# Patient Record
Sex: Male | Born: 1980 | Race: White | Hispanic: No | Marital: Single | Smoking: Never smoker
Health system: Southern US, Community
[De-identification: ages and names within clinical notes are randomized; demographics above are authoritative.]

## PROBLEM LIST (undated history)

## (undated) DIAGNOSIS — I8290 Acute embolism and thrombosis of unspecified vein: Secondary | ICD-10-CM

## (undated) DIAGNOSIS — M549 Dorsalgia, unspecified: Secondary | ICD-10-CM

## (undated) DIAGNOSIS — K8020 Calculus of gallbladder without cholecystitis without obstruction: Secondary | ICD-10-CM

## (undated) DIAGNOSIS — I82409 Acute embolism and thrombosis of unspecified deep veins of unspecified lower extremity: Secondary | ICD-10-CM

## (undated) DIAGNOSIS — R7303 Prediabetes: Secondary | ICD-10-CM

## (undated) DIAGNOSIS — E785 Hyperlipidemia, unspecified: Secondary | ICD-10-CM

## (undated) DIAGNOSIS — I2699 Other pulmonary embolism without acute cor pulmonale: Secondary | ICD-10-CM

## (undated) DIAGNOSIS — I2692 Saddle embolus of pulmonary artery without acute cor pulmonale: Secondary | ICD-10-CM

## (undated) HISTORY — DX: Dorsalgia, unspecified: M54.9

## (undated) HISTORY — DX: Hyperlipidemia, unspecified: E78.5

## (undated) HISTORY — DX: Prediabetes: R73.03

## (undated) HISTORY — PX: TONSILLECTOMY: SHX5217

---
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
## 2002-10-25 ENCOUNTER — Ambulatory Visit (HOSPITAL_COMMUNITY): Admission: RE | Admit: 2002-10-25 | Discharge: 2002-10-25 | Payer: Self-pay | Admitting: Oral Surgery

## 2007-10-05 ENCOUNTER — Emergency Department (HOSPITAL_COMMUNITY): Admission: EM | Admit: 2007-10-05 | Discharge: 2007-10-05 | Payer: Self-pay | Admitting: Emergency Medicine

## 2011-11-25 DIAGNOSIS — E785 Hyperlipidemia, unspecified: Secondary | ICD-10-CM | POA: Diagnosis not present

## 2011-11-25 DIAGNOSIS — R635 Abnormal weight gain: Secondary | ICD-10-CM | POA: Diagnosis not present

## 2011-11-25 DIAGNOSIS — R5381 Other malaise: Secondary | ICD-10-CM | POA: Diagnosis not present

## 2011-11-25 DIAGNOSIS — I10 Essential (primary) hypertension: Secondary | ICD-10-CM | POA: Diagnosis not present

## 2011-11-25 DIAGNOSIS — R109 Unspecified abdominal pain: Secondary | ICD-10-CM | POA: Diagnosis not present

## 2011-12-30 DIAGNOSIS — M549 Dorsalgia, unspecified: Secondary | ICD-10-CM | POA: Diagnosis not present

## 2011-12-30 DIAGNOSIS — R609 Edema, unspecified: Secondary | ICD-10-CM | POA: Diagnosis not present

## 2012-01-05 ENCOUNTER — Other Ambulatory Visit: Payer: Self-pay | Admitting: Family Medicine

## 2012-01-08 ENCOUNTER — Ambulatory Visit (HOSPITAL_COMMUNITY)
Admission: RE | Admit: 2012-01-08 | Discharge: 2012-01-08 | Disposition: A | Discharge status: Home / Self Care | Payer: Medicare Other | Source: Ambulatory Visit | Attending: Family Medicine | Admitting: Family Medicine

## 2012-01-08 DIAGNOSIS — K8020 Calculus of gallbladder without cholecystitis without obstruction: Secondary | ICD-10-CM | POA: Diagnosis not present

## 2012-01-08 DIAGNOSIS — R748 Abnormal levels of other serum enzymes: Secondary | ICD-10-CM | POA: Diagnosis not present

## 2012-01-13 ENCOUNTER — Other Ambulatory Visit: Payer: Self-pay | Admitting: Family Medicine

## 2012-01-13 DIAGNOSIS — K760 Fatty (change of) liver, not elsewhere classified: Secondary | ICD-10-CM

## 2012-01-14 ENCOUNTER — Ambulatory Visit (HOSPITAL_COMMUNITY)
Admission: RE | Admit: 2012-01-14 | Discharge: 2012-01-14 | Disposition: A | Discharge status: Home / Self Care | Payer: Medicaid Other | Source: Ambulatory Visit | Attending: Family Medicine | Admitting: Family Medicine

## 2012-01-14 DIAGNOSIS — K760 Fatty (change of) liver, not elsewhere classified: Secondary | ICD-10-CM

## 2012-01-21 ENCOUNTER — Encounter (HOSPITAL_COMMUNITY): Payer: Self-pay | Admitting: *Deleted

## 2012-01-21 ENCOUNTER — Inpatient Hospital Stay (HOSPITAL_COMMUNITY)
Admission: EM | Admit: 2012-01-21 | Discharge: 2012-01-25 | DRG: 418 | Disposition: A | Discharge status: Home / Self Care | Payer: Medicare Other | Attending: General Surgery | Admitting: General Surgery

## 2012-01-21 ENCOUNTER — Emergency Department (HOSPITAL_COMMUNITY): Payer: Medicare Other

## 2012-01-21 DIAGNOSIS — K8000 Calculus of gallbladder with acute cholecystitis without obstruction: Principal | ICD-10-CM | POA: Diagnosis present

## 2012-01-21 DIAGNOSIS — E669 Obesity, unspecified: Secondary | ICD-10-CM | POA: Diagnosis present

## 2012-01-21 DIAGNOSIS — R079 Chest pain, unspecified: Secondary | ICD-10-CM | POA: Diagnosis not present

## 2012-01-21 DIAGNOSIS — R51 Headache: Secondary | ICD-10-CM | POA: Diagnosis present

## 2012-01-21 DIAGNOSIS — K810 Acute cholecystitis: Secondary | ICD-10-CM | POA: Diagnosis not present

## 2012-01-21 DIAGNOSIS — R1011 Right upper quadrant pain: Secondary | ICD-10-CM | POA: Diagnosis not present

## 2012-01-21 DIAGNOSIS — Z6841 Body Mass Index (BMI) 40.0 and over, adult: Secondary | ICD-10-CM | POA: Diagnosis not present

## 2012-01-21 DIAGNOSIS — K8020 Calculus of gallbladder without cholecystitis without obstruction: Secondary | ICD-10-CM | POA: Diagnosis not present

## 2012-01-21 DIAGNOSIS — R109 Unspecified abdominal pain: Secondary | ICD-10-CM

## 2012-01-21 DIAGNOSIS — K819 Cholecystitis, unspecified: Secondary | ICD-10-CM | POA: Diagnosis not present

## 2012-01-21 HISTORY — DX: Calculus of gallbladder without cholecystitis without obstruction: K80.20

## 2012-01-21 LAB — COMPREHENSIVE METABOLIC PANEL
ALT: 62 U/L — ABNORMAL HIGH (ref 0–53)
AST: 29 U/L (ref 0–37)
Alkaline Phosphatase: 68 U/L (ref 39–117)
CO2: 28 mEq/L (ref 19–32)
GFR calc Af Amer: >90 mL/min (ref >90)
GFR calc non Af Amer: 82 mL/min — ABNORMAL LOW (ref >90)
Glucose, Bld: 108 mg/dL — ABNORMAL HIGH (ref 70–99)
Potassium: 4 mEq/L (ref 3.5–5.1)
Sodium: 137 mEq/L (ref 135–145)
Total Protein: 7.3 g/dL (ref 6.0–8.3)

## 2012-01-21 LAB — CBC WITH DIFFERENTIAL/PLATELET
Lymphocytes Relative: 22 % (ref 12–46)
Lymphs Abs: 1.3 10*3/uL (ref 0.7–4.0)
Neutro Abs: 3.9 10*3/uL (ref 1.7–7.7)
Neutrophils Relative %: 64 % (ref 43–77)
Platelets: 246 10*3/uL (ref 150–400)
RBC: 4.4 MIL/uL (ref 4.22–5.81)
WBC: 6 10*3/uL (ref 4.0–10.5)

## 2012-01-21 MED ORDER — ENOXAPARIN SODIUM 40 MG/0.4ML ~~LOC~~ SOLN
40.0000 mg | SUBCUTANEOUS | Status: DC
  Administered 2012-01-21 – 2012-01-24 (×4): 40 mg via SUBCUTANEOUS
  Filled 2012-01-21 (×4): qty 0.4

## 2012-01-21 MED ORDER — ONDANSETRON HCL 4 MG/2ML IJ SOLN
INTRAMUSCULAR | Status: AC
  Filled 2012-01-21: qty 2

## 2012-01-21 MED ORDER — ONDANSETRON HCL 4 MG/2ML IJ SOLN
4.0000 mg | Freq: Once | INTRAMUSCULAR | Status: AC
  Administered 2012-01-21: 4 mg via INTRAVENOUS
  Filled 2012-01-21: qty 2

## 2012-01-21 MED ORDER — ONDANSETRON HCL 4 MG/2ML IJ SOLN
4.0000 mg | Freq: Three times a day (TID) | INTRAMUSCULAR | Status: AC | PRN
  Administered 2012-01-21: 4 mg via INTRAVENOUS
  Filled 2012-01-21: qty 2

## 2012-01-21 MED ORDER — LACTATED RINGERS IV SOLN
INTRAVENOUS | Status: DC
  Administered 2012-01-21 – 2012-01-22 (×3): via INTRAVENOUS

## 2012-01-21 MED ORDER — SODIUM CHLORIDE 0.9 % IV SOLN: INTRAVENOUS | Status: AC

## 2012-01-21 MED ORDER — HYDROMORPHONE HCL PF 1 MG/ML IJ SOLN
INTRAMUSCULAR | Status: AC
  Filled 2012-01-21: qty 1

## 2012-01-21 MED ORDER — HYDROMORPHONE HCL PF 1 MG/ML IJ SOLN
1.0000 mg | INTRAMUSCULAR | Status: DC | PRN
  Administered 2012-01-21: 1 mg via INTRAVENOUS

## 2012-01-21 MED ORDER — PANTOPRAZOLE SODIUM 40 MG IV SOLR
40.0000 mg | Freq: Every day | INTRAVENOUS | Status: DC
  Administered 2012-01-21 – 2012-01-24 (×4): 40 mg via INTRAVENOUS
  Filled 2012-01-21 (×4): qty 40

## 2012-01-21 MED ORDER — ONDANSETRON HCL 4 MG/2ML IJ SOLN
4.0000 mg | Freq: Four times a day (QID) | INTRAMUSCULAR | Status: DC | PRN
  Administered 2012-01-21: 4 mg via INTRAVENOUS

## 2012-01-21 MED ORDER — HYDROMORPHONE HCL PF 1 MG/ML IJ SOLN
1.0000 mg | INTRAMUSCULAR | Status: AC | PRN
  Administered 2012-01-21 (×2): 1 mg via INTRAVENOUS
  Filled 2012-01-21 (×2): qty 1

## 2012-01-21 MED ORDER — SODIUM CHLORIDE 0.9 % IV SOLN
INTRAVENOUS | Status: DC
  Administered 2012-01-21: 11:00:00 via INTRAVENOUS

## 2012-01-21 MED ORDER — HYDROMORPHONE HCL PF 1 MG/ML IJ SOLN
1.0000 mg | Freq: Once | INTRAMUSCULAR | Status: AC
  Administered 2012-01-21: 1 mg via INTRAVENOUS
  Filled 2012-01-21: qty 1

## 2012-01-21 NOTE — ED Notes (Signed)
Family at bedside. Patient assisted to restroom back to bed tolerated well. Patient would like some pain medicine at this time. RN made aware.

## 2012-01-21 NOTE — ED Notes (Signed)
Hx of gallstones - report right sided abd pain with cp and upper back pain between shoulder blades since 0300.  Also c/o nausea and periods of sob.

## 2012-01-21 NOTE — ED Provider Notes (Cosign Needed Addendum)
History   This chart was scribed for Carleene Cooper III, MD by Gerlean Ren. This patient was seen in room APA12/[facility]12 and the patient's care was started at 9:55AM.   CSN: [id number]  Arrival date & time 01/21/12  0905   First MD Initiated Contact with Patient 01/21/12 [phone]      Chief Complaint  Patient presents with  . Chest Pain  . Abdominal Pain    (Consider location/radiation/quality/duration/timing/severity/associated sxs/prior treatment) The history is provided by the patient. No language interpreter was used.   Justin Schmidt is a 31 y.o. male with a h/o recently discovered cholelithiasis who presents to the Emergency Department complaining of sudden onset, gradually worsening, non-radiating right RUQ pain with associated nausea beginning 7 hours ago with no obvious cause of onset that is worsened by deep breathing and has thus caused mild SOB.  Pt further reports chest and upper back pain between shoulders.  Pt has taken ibuprofen with no improvement to pain.  Pt denies neck pain, fever, otalgia, sore throat, coughing, urinary symptoms, rash, syncope, and seizures.  Mother and grandfather h/o cholelithiasis.  Pt has had appendectomy.  Pt denies tobacco use and reports rare alcohol use.  Pt was told no surgical intervention for cholelithiasis would be necessary upon diagnosis.  Past Medical History  Diagnosis Date  . Gallstones     Past Surgical History  Procedure Date  . Appendectomy     No family history on file.  History  Substance Use Topics  . Smoking status: Never Smoker   . Smokeless tobacco: Not on file  . Alcohol Use: No     occasional      Review of Systems  All other systems reviewed and are negative.    Allergies  Review of patient's allergies indicates no known allergies.  Home Medications  No current outpatient prescriptions on file.  BP 136/72  Pulse 74  Temp 98.4 F (36.9 C) (Oral)  Resp 20  Ht 5\' 9"  (1.753 m)  Wt 317 lb (143.79 kg)  BMI  46.81 kg/m2  SpO2 99%  Physical Exam  Nursing note and vitals reviewed. Constitutional: He is oriented to person, place, and time. He appears well-developed.       Pt is morbidly obese.  HENT:  Head: Normocephalic and atraumatic.  Eyes: Conjunctivae normal and EOM are normal. Pupils are equal, round, and reactive to light. No scleral icterus.  Neck: Normal range of motion. Neck supple. No tracheal deviation present.  Cardiovascular: Normal rate, regular rhythm and normal heart sounds.  Exam reveals no gallop and no friction rub.   No murmur heard. Pulmonary/Chest: Effort normal and breath sounds normal. He has no wheezes. He has no rales. He exhibits no tenderness.  Abdominal: Soft. Bowel sounds are normal. He exhibits no distension and no mass. There is tenderness. There is no rebound.       RUQ tenderness.  No rigidity.    Musculoskeletal: Normal range of motion. He exhibits no tenderness.       2+ edema in bilateral lower legs.  Lymphadenopathy:    He has no cervical adenopathy.  Neurological: He is alert and oriented to person, place, and time. Coordination normal.  Skin: Skin is warm. No rash noted. No erythema.       Not jaundiced.    Psychiatric: He has a normal mood and affect. His behavior is normal.    ED Course  Procedures (including critical care time) DIAGNOSTIC STUDIES: Oxygen Saturation is 99% on room  air, normal by my interpretation.    COORDINATION OF CARE: 10:02AM- Ordered Iv fluids, zofran, dilaudid, CBC, urinalysis, c-met, lipase, and chest XR. 12:57PM- Re-check, pt's pain has somewhat reduced.  Informed pt of normal tests, offered decision to be admitted to pt. 1:24PM- Pt has chosen to be admitted.      Date: 01/21/2012  Rate:63  Rhythm: normal sinus rhythm  QRS Axis: normal  Intervals: normal  ST/T Wave abnormalities: normal  Conduction Disutrbances:none  Narrative Interpretation: Normal EKG  Old EKG Reviewed: none available Results for orders  placed during the hospital encounter of 01/21/12  CBC WITH DIFFERENTIAL      Component Value Range   WBC 6.0  4.0 - 10.5 K/uL   RBC 4.40  4.22 - 5.81 MIL/uL   Hemoglobin 14.2  13.0 - 17.0 g/dL   HCT 16.1  09.6 - 04.5 %   MCV 91.4  78.0 - 100.0 fL   MCH 32.3  26.0 - 34.0 pg   MCHC 35.3  30.0 - 36.0 g/dL   RDW 40.9  81.1 - 91.4 %   Platelets 246  150 - 400 K/uL   Neutrophils Relative 64  43 - 77 %   Neutro Abs 3.9  1.7 - 7.7 K/uL   Lymphocytes Relative 22  12 - 46 %   Lymphs Abs 1.3  0.7 - 4.0 K/uL   Monocytes Relative 10  3 - 12 %   Monocytes Absolute 0.6  0.1 - 1.0 K/uL   Eosinophils Relative 3  0 - 5 %   Eosinophils Absolute 0.2  0.0 - 0.7 K/uL   Basophils Relative 1  0 - 1 %   Basophils Absolute 0.0  0.0 - 0.1 K/uL  COMPREHENSIVE METABOLIC PANEL      Component Value Range   Sodium 137  135 - 145 mEq/L   Potassium 4.0  3.5 - 5.1 mEq/L   Chloride 101  96 - 112 mEq/L   CO2 28  19 - 32 mEq/L   Glucose, Bld 108 (*) 70 - 99 mg/dL   BUN 16  6 - 23 mg/dL   Creatinine, Ser 7.82  0.50 - 1.35 mg/dL   Calcium 9.7  8.4 - 95.6 mg/dL   Total Protein 7.3  6.0 - 8.3 g/dL   Albumin 4.0  3.5 - 5.2 g/dL   AST 29  0 - 37 U/L   ALT 62 (*) 0 - 53 U/L   Alkaline Phosphatase 68  39 - 117 U/L   Total Bilirubin 0.2 (*) 0.3 - 1.2 mg/dL   GFR calc non Af Amer 82 (*) >90 mL/min   GFR calc Af Amer >90  >90 mL/min  LIPASE, BLOOD      Component Value Range   Lipase 21  11 - 59 U/L    .   Dg Chest 2 View  01/21/2012  *RADIOLOGY REPORT*  Clinical Data: Chest pain  CHEST - 2 VIEW  Comparison: None.  Findings: Cardiomediastinal silhouette is unremarkable.  No acute infiltrate or pleural effusion.  No pulmonary edema.  Bony thorax is unremarkable.  IMPRESSION: No active disease.   Original Report Authenticated By: Natasha Mead, M.D.    1:57 PM Pt has persisting pain despite IV medication for pain and nausea.  Case discussed with Dr. Leticia Penna, surgeon on call, who will see and admit pt.   1. Abdominal  pain   2. Cholelithiasis     I personally performed the services described in this documentation, which was scribed  in my presence. The recorded information has been reviewed and considered.  Osvaldo Human, MD      Carleene Cooper III, MD 01/21/12 1400    Carleene Cooper III, MD 01/21/12 [phone]

## 2012-01-21 NOTE — Plan of Care (Signed)
Problem: Consults Goal: General Medical Patient Education See Patient Education Module for specific education. Outcome: Progressing abd pain  Problem: Phase I Progression Outcomes Goal: Initial discharge plan identified Outcome: Completed/Met Date Met:  01/21/12 Plan to d/c back home with family

## 2012-01-21 NOTE — ED Notes (Signed)
Pt reports having an MRI scheduled for tmrw.  Pt reports having an ultrasound performed here a few weeks ago and some spots on his liver were found.

## 2012-01-21 NOTE — ED Notes (Signed)
Called to give reports.  Nurse to call me back

## 2012-01-22 LAB — CBC
HCT: 40.3 % (ref 39.0–52.0)
MCV: 92 fL (ref 78.0–100.0)
RBC: 4.38 MIL/uL (ref 4.22–5.81)
WBC: 8.2 10*3/uL (ref 4.0–10.5)

## 2012-01-22 LAB — URINALYSIS, ROUTINE W REFLEX MICROSCOPIC
Bilirubin Urine: NEGATIVE
Glucose, UA: NEGATIVE mg/dL
Hgb urine dipstick: NEGATIVE
Specific Gravity, Urine: 1.025 (ref 1.005–1.030)
Urobilinogen, UA: 0.2 mg/dL (ref 0.0–1.0)

## 2012-01-22 LAB — COMPREHENSIVE METABOLIC PANEL
Albumin: 3.8 g/dL (ref 3.5–5.2)
Alkaline Phosphatase: 56 U/L (ref 39–117)
BUN: 11 mg/dL (ref 6–23)
Creatinine, Ser: 1.13 mg/dL (ref 0.50–1.35)
Potassium: 3.7 mEq/L (ref 3.5–5.1)
Total Protein: 7 g/dL (ref 6.0–8.3)

## 2012-01-22 MED ORDER — ACETAMINOPHEN 325 MG PO TABS
650.0000 mg | ORAL_TABLET | Freq: Once | ORAL | Status: AC
  Administered 2012-01-22: 650 mg via ORAL
  Filled 2012-01-22: qty 2

## 2012-01-22 NOTE — Plan of Care (Signed)
Problem: Phase III Progression Outcomes Goal: Activity at appropriate level-compared to baseline (UP IN CHAIR FOR HEMODIALYSIS)  Outcome: Completed/Met Date Met:  01/22/12 Pt up to chair.  Ambulating independently to bathroom.

## 2012-01-22 NOTE — Progress Notes (Signed)
Pt c/o headache.  Pt has no PRN orders for pain.  Dr. Suzette Battiest paged and returned page.  Received telephone order for Tylenol 650 mg 1 po for one dose.  Dr. Suzette Battiest stated that he would be up to see the patient shortly.  Orders followed.

## 2012-01-22 NOTE — H&P (Signed)
Justin Schmidt is an 31 y.o. male.   Chief Complaint: RUQ pain  HPI: Patient has had several months of episodes of RUQ pain.  No fever or chills.  No history of jaundice.  No change with BM.  No melena.  No hematochezia.  Some nausea.  No emesis.  Known gallstones.    Past Medical History  Diagnosis Date  . Gallstones     Past Surgical History  Procedure Date  . Appendectomy     No family history on file. Social History:  reports that he has never smoked. He does not have any smokeless tobacco history on file. He reports that he does not drink alcohol or use illicit drugs.  Allergies: No Known Allergies  Medications Prior to Admission  Medication Sig Dispense Refill  . ibuprofen (ADVIL,MOTRIN) 200 MG tablet Take 200 mg by mouth every 6 (six) hours as needed. Pain      . Multiple Vitamin (MULTIVITAMIN WITH MINERALS) TABS Take 1 tablet by mouth daily.        Results for orders placed during the hospital encounter of 01/21/12 (from the past 48 hour(s))  CBC WITH DIFFERENTIAL     Status: Normal   Collection Time   01/21/12 10:08 AM      Component Value Range Comment   WBC 6.0  4.0 - 10.5 K/uL    RBC 4.40  4.22 - 5.81 MIL/uL    Hemoglobin 14.2  13.0 - 17.0 g/dL    HCT 19.1  47.8 - 29.5 %    MCV 91.4  78.0 - 100.0 fL    MCH 32.3  26.0 - 34.0 pg    MCHC 35.3  30.0 - 36.0 g/dL    RDW 62.1  30.8 - 65.7 %    Platelets 246  150 - 400 K/uL    Neutrophils Relative 64  43 - 77 %    Neutro Abs 3.9  1.7 - 7.7 K/uL    Lymphocytes Relative 22  12 - 46 %    Lymphs Abs 1.3  0.7 - 4.0 K/uL    Monocytes Relative 10  3 - 12 %    Monocytes Absolute 0.6  0.1 - 1.0 K/uL    Eosinophils Relative 3  0 - 5 %    Eosinophils Absolute 0.2  0.0 - 0.7 K/uL    Basophils Relative 1  0 - 1 %    Basophils Absolute 0.0  0.0 - 0.1 K/uL   COMPREHENSIVE METABOLIC PANEL     Status: Abnormal   Collection Time   01/21/12 10:08 AM      Component Value Range Comment   Sodium 137  135 - 145 mEq/L    Potassium 4.0   3.5 - 5.1 mEq/L    Chloride 101  96 - 112 mEq/L    CO2 28  19 - 32 mEq/L    Glucose, Bld 108 (*) 70 - 99 mg/dL    BUN 16  6 - 23 mg/dL    Creatinine, Ser 8.46  0.50 - 1.35 mg/dL    Calcium 9.7  8.4 - 96.2 mg/dL    Total Protein 7.3  6.0 - 8.3 g/dL    Albumin 4.0  3.5 - 5.2 g/dL    AST 29  0 - 37 U/L    ALT 62 (*) 0 - 53 U/L    Alkaline Phosphatase 68  39 - 117 U/L    Total Bilirubin 0.2 (*) 0.3 - 1.2 mg/dL    GFR  calc non Af Amer 82 (*) >90 mL/min    GFR calc Af Amer >90  >90 mL/min   LIPASE, BLOOD     Status: Normal   Collection Time   01/21/12 10:08 AM      Component Value Range Comment   Lipase 21  11 - 59 U/L   URINALYSIS, ROUTINE W REFLEX MICROSCOPIC     Status: Normal   Collection Time   01/21/12 11:42 PM      Component Value Range Comment   Color, Urine YELLOW  YELLOW    APPearance CLEAR  CLEAR    Specific Gravity, Urine 1.025  1.005 - 1.030    pH 5.5  5.0 - 8.0    Glucose, UA NEGATIVE  NEGATIVE mg/dL    Hgb urine dipstick NEGATIVE  NEGATIVE    Bilirubin Urine NEGATIVE  NEGATIVE    Ketones, ur NEGATIVE  NEGATIVE mg/dL    Protein, ur NEGATIVE  NEGATIVE mg/dL    Urobilinogen, UA 0.2  0.0 - 1.0 mg/dL    Nitrite NEGATIVE  NEGATIVE    Leukocytes, UA NEGATIVE  NEGATIVE MICROSCOPIC NOT DONE ON URINES WITH NEGATIVE PROTEIN, BLOOD, LEUKOCYTES, NITRITE, OR GLUCOSE <1000 mg/dL.  COMPREHENSIVE METABOLIC PANEL     Status: Abnormal   Collection Time   01/22/12  5:56 AM      Component Value Range Comment   Sodium 137  135 - 145 mEq/L    Potassium 3.7  3.5 - 5.1 mEq/L    Chloride 101  96 - 112 mEq/L    CO2 28  19 - 32 mEq/L    Glucose, Bld 117 (*) 70 - 99 mg/dL    BUN 11  6 - 23 mg/dL    Creatinine, Ser 1.61  0.50 - 1.35 mg/dL    Calcium 9.3  8.4 - 09.6 mg/dL    Total Protein 7.0  6.0 - 8.3 g/dL    Albumin 3.8  3.5 - 5.2 g/dL    AST 25  0 - 37 U/L    ALT 57 (*) 0 - 53 U/L    Alkaline Phosphatase 56  39 - 117 U/L    Total Bilirubin 0.5  0.3 - 1.2 mg/dL    GFR calc non Af Amer  85 (*) >90 mL/min    GFR calc Af Amer >90  >90 mL/min   CBC     Status: Normal   Collection Time   01/22/12  5:56 AM      Component Value Range Comment   WBC 8.2  4.0 - 10.5 K/uL    RBC 4.38  4.22 - 5.81 MIL/uL    Hemoglobin 14.0  13.0 - 17.0 g/dL    HCT 04.5  40.9 - 81.1 %    MCV 92.0  78.0 - 100.0 fL    MCH 32.0  26.0 - 34.0 pg    MCHC 34.7  30.0 - 36.0 g/dL    RDW 91.4  78.2 - 95.6 %    Platelets 242  150 - 400 K/uL    Dg Chest 2 View  01/21/2012  *RADIOLOGY REPORT*  Clinical Data: Chest pain  CHEST - 2 VIEW  Comparison: None.  Findings: Cardiomediastinal silhouette is unremarkable.  No acute infiltrate or pleural effusion.  No pulmonary edema.  Bony thorax is unremarkable.  IMPRESSION: No active disease.   Original Report Authenticated By: Natasha Mead, M.D.     Review of Systems  Constitutional: Positive for fever and chills. Negative for weight loss, malaise/fatigue  and diaphoresis.  Eyes: Negative.   Respiratory: Negative.   Cardiovascular: Negative.   Gastrointestinal: Positive for heartburn, nausea, abdominal pain (RUQ, epigastric with radiation to back.) and diarrhea. Negative for vomiting, constipation, blood in stool and melena.  Genitourinary: Negative.   Musculoskeletal: Negative.   Skin: Negative.   Neurological: Positive for headaches. Negative for weakness.  Endo/Heme/Allergies: Negative.   Psychiatric/Behavioral: Negative.     Blood pressure 132/79, pulse 81, temperature 98 F (36.7 C), temperature source Oral, resp. rate 18, height 5\' 9"  (1.753 m), weight 146.693 kg (323 lb 6.4 oz), SpO2 100.00%. Physical Exam  Constitutional: He is oriented to person, place, and time. He appears well-developed and well-nourished. No distress.       obese  HENT:  Head: Normocephalic and atraumatic.  Eyes: EOM are normal. Pupils are equal, round, and reactive to light. No scleral icterus.  Neck: Normal range of motion. Neck supple. No tracheal deviation present. No thyromegaly  present.  Cardiovascular: Normal rate, regular rhythm and normal heart sounds.   Respiratory: Effort normal and breath sounds normal.  GI: Soft. He exhibits no distension and no mass. There is tenderness (mild to moderate RUQ pain.  + murphy's). There is no rebound and no guarding.       obese  Lymphadenopathy:    He has no cervical adenopathy.  Neurological: He is alert and oriented to person, place, and time.  Skin: Skin is warm and dry.     Assessment/Plan Cholelithiasis, acute cholecystitis.  Continue slow advancement of diet.  NPO after Midnight.  IV fluid.  Risks benefits alternatives of surgery discussed with the patient.  Will plan to proceed with OR tomorrow.    Aniruddh Ciavarella C 01/22/2012, 10:14 PM

## 2012-01-23 ENCOUNTER — Encounter (HOSPITAL_COMMUNITY)
Admission: EM | Disposition: A | Discharge status: Home / Self Care | Payer: Self-pay | Source: Home / Self Care | Attending: General Surgery

## 2012-01-23 ENCOUNTER — Inpatient Hospital Stay (HOSPITAL_COMMUNITY): Payer: Medicare Other | Admitting: Anesthesiology

## 2012-01-23 ENCOUNTER — Encounter (HOSPITAL_COMMUNITY): Payer: Self-pay | Admitting: Anesthesiology

## 2012-01-23 ENCOUNTER — Encounter (HOSPITAL_COMMUNITY): Payer: Self-pay

## 2012-01-23 HISTORY — PX: CHOLECYSTECTOMY: SHX55

## 2012-01-23 SURGERY — LAPAROSCOPIC CHOLECYSTECTOMY
Anesthesia: General | Wound class: Clean Contaminated

## 2012-01-23 MED ORDER — BUPIVACAINE HCL (PF) 0.5 % IJ SOLN
INTRAMUSCULAR | Status: AC
  Filled 2012-01-23: qty 30

## 2012-01-23 MED ORDER — ROCURONIUM BROMIDE 100 MG/10ML IV SOLN
INTRAVENOUS | Status: DC | PRN
  Administered 2012-01-23: 40 mg via INTRAVENOUS

## 2012-01-23 MED ORDER — DEXTROSE 5 % IV SOLN: 3.0000 g | INTRAVENOUS | Status: DC

## 2012-01-23 MED ORDER — ROCURONIUM BROMIDE 50 MG/5ML IV SOLN
INTRAVENOUS | Status: AC
  Filled 2012-01-23: qty 1

## 2012-01-23 MED ORDER — MIDAZOLAM HCL 2 MG/2ML IJ SOLN
INTRAMUSCULAR | Status: AC
  Filled 2012-01-23: qty 2

## 2012-01-23 MED ORDER — MUPIROCIN 2 % EX OINT
TOPICAL_OINTMENT | Freq: Two times a day (BID) | CUTANEOUS | Status: DC
Start: 2012-01-23 — End: 2012-01-25
  Administered 2012-01-23 – 2012-01-24 (×2): via NASAL
  Filled 2012-01-23: qty 22

## 2012-01-23 MED ORDER — GLYCOPYRROLATE 0.2 MG/ML IJ SOLN
0.2000 mg | Freq: Once | INTRAMUSCULAR | Status: AC
  Administered 2012-01-23: 0.2 mg via INTRAVENOUS

## 2012-01-23 MED ORDER — LIDOCAINE HCL 1 % IJ SOLN
INTRAMUSCULAR | Status: DC | PRN
  Administered 2012-01-23: 50 mg via INTRADERMAL

## 2012-01-23 MED ORDER — LACTATED RINGERS IV SOLN
INTRAVENOUS | Status: DC
  Administered 2012-01-23: 09:00:00 via INTRAVENOUS

## 2012-01-23 MED ORDER — CEFAZOLIN SODIUM 1-5 GM-% IV SOLN
1.0000 g | INTRAVENOUS | Status: AC
  Administered 2012-01-23: 1 g via INTRAVENOUS

## 2012-01-23 MED ORDER — SUCCINYLCHOLINE CHLORIDE 20 MG/ML IJ SOLN
INTRAMUSCULAR | Status: AC
  Filled 2012-01-23: qty 1

## 2012-01-23 MED ORDER — DEXAMETHASONE SODIUM PHOSPHATE 4 MG/ML IJ SOLN
4.0000 mg | Freq: Once | INTRAMUSCULAR | Status: AC
  Administered 2012-01-23: 4 mg via INTRAVENOUS

## 2012-01-23 MED ORDER — FENTANYL CITRATE 0.05 MG/ML IJ SOLN
INTRAMUSCULAR | Status: AC
  Filled 2012-01-23: qty 2

## 2012-01-23 MED ORDER — FENTANYL CITRATE 0.05 MG/ML IJ SOLN
25.0000 ug | INTRAMUSCULAR | Status: DC | PRN
  Administered 2012-01-23 (×2): 25 ug via INTRAVENOUS
  Administered 2012-01-23: 50 ug via INTRAVENOUS

## 2012-01-23 MED ORDER — DEXAMETHASONE SODIUM PHOSPHATE 4 MG/ML IJ SOLN
INTRAMUSCULAR | Status: AC
  Filled 2012-01-23: qty 1

## 2012-01-23 MED ORDER — CEFAZOLIN SODIUM-DEXTROSE 2-3 GM-% IV SOLR
INTRAVENOUS | Status: AC
  Filled 2012-01-23: qty 50

## 2012-01-23 MED ORDER — PROPOFOL 10 MG/ML IV BOLUS
INTRAVENOUS | Status: DC | PRN
  Administered 2012-01-23: 200 mg via INTRAVENOUS

## 2012-01-23 MED ORDER — FENTANYL CITRATE 0.05 MG/ML IJ SOLN
INTRAMUSCULAR | Status: AC
  Filled 2012-01-23: qty 10

## 2012-01-23 MED ORDER — BUPIVACAINE HCL (PF) 0.5 % IJ SOLN
INTRAMUSCULAR | Status: DC | PRN
  Administered 2012-01-23: 10 mL

## 2012-01-23 MED ORDER — PROPOFOL 10 MG/ML IV EMUL
INTRAVENOUS | Status: AC
  Filled 2012-01-23: qty 20

## 2012-01-23 MED ORDER — CELECOXIB 100 MG PO CAPS
200.0000 mg | ORAL_CAPSULE | Freq: Two times a day (BID) | ORAL | Status: DC
  Administered 2012-01-23 – 2012-01-25 (×5): 200 mg via ORAL
  Filled 2012-01-23 (×5): qty 2

## 2012-01-23 MED ORDER — GLYCOPYRROLATE 0.2 MG/ML IJ SOLN
INTRAMUSCULAR | Status: AC
  Filled 2012-01-23: qty 1

## 2012-01-23 MED ORDER — ONDANSETRON HCL 4 MG/2ML IJ SOLN
INTRAMUSCULAR | Status: AC
  Filled 2012-01-23: qty 2

## 2012-01-23 MED ORDER — ONDANSETRON HCL 4 MG/2ML IJ SOLN: 4.0000 mg | Freq: Once | INTRAMUSCULAR | Status: DC | PRN

## 2012-01-23 MED ORDER — MIDAZOLAM HCL 5 MG/5ML IJ SOLN
INTRAMUSCULAR | Status: DC | PRN
  Administered 2012-01-23: 2 mg via INTRAVENOUS

## 2012-01-23 MED ORDER — HEMOSTATIC AGENTS (NO CHARGE) OPTIME
TOPICAL | Status: DC | PRN
  Administered 2012-01-23: 1 via TOPICAL

## 2012-01-23 MED ORDER — MUPIROCIN 2 % EX OINT
TOPICAL_OINTMENT | CUTANEOUS | Status: AC
  Filled 2012-01-23: qty 22

## 2012-01-23 MED ORDER — LIDOCAINE HCL (PF) 1 % IJ SOLN
INTRAMUSCULAR | Status: AC
  Filled 2012-01-23: qty 5

## 2012-01-23 MED ORDER — CEFAZOLIN SODIUM-DEXTROSE 2-3 GM-% IV SOLR
INTRAVENOUS | Status: DC | PRN
  Administered 2012-01-23: 1 g via INTRAVENOUS
  Administered 2012-01-23: 2 g via INTRAVENOUS

## 2012-01-23 MED ORDER — CEFAZOLIN SODIUM-DEXTROSE 2-3 GM-% IV SOLR
2.0000 g | INTRAVENOUS | Status: AC
  Administered 2012-01-23: 2 g via INTRAVENOUS

## 2012-01-23 MED ORDER — FENTANYL CITRATE 0.05 MG/ML IJ SOLN
INTRAMUSCULAR | Status: DC | PRN
  Administered 2012-01-23 (×3): 100 ug via INTRAVENOUS
  Administered 2012-01-23 (×3): 50 ug via INTRAVENOUS

## 2012-01-23 MED ORDER — MIDAZOLAM HCL 2 MG/2ML IJ SOLN
1.0000 mg | INTRAMUSCULAR | Status: DC | PRN
  Administered 2012-01-23: 2 mg via INTRAVENOUS

## 2012-01-23 MED ORDER — LABETALOL HCL 5 MG/ML IV SOLN
INTRAVENOUS | Status: AC
  Filled 2012-01-23: qty 4

## 2012-01-23 MED ORDER — SUCCINYLCHOLINE CHLORIDE 20 MG/ML IJ SOLN
INTRAMUSCULAR | Status: DC | PRN
  Administered 2012-01-23: 180 mg via INTRAVENOUS

## 2012-01-23 MED ORDER — SODIUM CHLORIDE 0.9 % IR SOLN
Status: DC | PRN
  Administered 2012-01-23: 1000 mL

## 2012-01-23 MED ORDER — HYDROCODONE-ACETAMINOPHEN 5-325 MG PO TABS
1.0000 | ORAL_TABLET | ORAL | Status: DC | PRN
  Administered 2012-01-23 – 2012-01-25 (×4): 2 via ORAL
  Filled 2012-01-23 (×4): qty 2

## 2012-01-23 MED ORDER — MUPIROCIN 2 % EX OINT
TOPICAL_OINTMENT | Freq: Two times a day (BID) | CUTANEOUS | Status: DC
  Administered 2012-01-23: 1 via NASAL

## 2012-01-23 MED ORDER — ONDANSETRON HCL 4 MG/2ML IJ SOLN
4.0000 mg | Freq: Once | INTRAMUSCULAR | Status: AC
  Administered 2012-01-23: 4 mg via INTRAVENOUS

## 2012-01-23 SURGICAL SUPPLY — 38 items
APPLIER CLIP UNV 5X34 EPIX (ENDOMECHANICALS) ×2 IMPLANT
BAG HAMPER (MISCELLANEOUS) ×2 IMPLANT
BENZOIN TINCTURE PRP APPL 2/3 (GAUZE/BANDAGES/DRESSINGS) ×2 IMPLANT
CLOTH BEACON ORANGE TIMEOUT ST (SAFETY) ×2 IMPLANT
COVER LIGHT HANDLE STERIS (MISCELLANEOUS) ×4 IMPLANT
DECANTER SPIKE VIAL GLASS SM (MISCELLANEOUS) ×2 IMPLANT
DEVICE TROCAR PUNCTURE CLOSURE (ENDOMECHANICALS) ×2 IMPLANT
DURAPREP 26ML APPLICATOR (WOUND CARE) ×2 IMPLANT
ELECT REM PT RETURN 9FT ADLT (ELECTROSURGICAL) ×2
ELECTRODE REM PT RTRN 9FT ADLT (ELECTROSURGICAL) ×1 IMPLANT
FILTER SMOKE EVAC LAPAROSHD (FILTER) ×2 IMPLANT
FORMALIN 10 PREFIL 120ML (MISCELLANEOUS) ×2 IMPLANT
GLOVE BIOGEL PI IND STRL 7.0 (GLOVE) ×1 IMPLANT
GLOVE BIOGEL PI IND STRL 7.5 (GLOVE) ×2 IMPLANT
GLOVE BIOGEL PI INDICATOR 7.0 (GLOVE) ×1
GLOVE BIOGEL PI INDICATOR 7.5 (GLOVE) ×2
GLOVE ECLIPSE 6.5 STRL STRAW (GLOVE) ×2 IMPLANT
GLOVE ECLIPSE 7.0 STRL STRAW (GLOVE) ×6 IMPLANT
GOWN STRL REIN XL XLG (GOWN DISPOSABLE) ×6 IMPLANT
HEMOSTAT SNOW SURGICEL 2X4 (HEMOSTASIS) ×2 IMPLANT
INST SET LAPROSCOPIC AP (KITS) ×2 IMPLANT
IV NS IRRIG 3000ML ARTHROMATIC (IV SOLUTION) ×2 IMPLANT
KIT ROOM TURNOVER APOR (KITS) ×2 IMPLANT
MANIFOLD NEPTUNE II (INSTRUMENTS) ×2 IMPLANT
NEEDLE INSUFFLATION 14GA 120MM (NEEDLE) ×2 IMPLANT
PACK LAP CHOLE LZT030E (CUSTOM PROCEDURE TRAY) ×2 IMPLANT
PAD ARMBOARD 7.5X6 YLW CONV (MISCELLANEOUS) ×2 IMPLANT
POUCH SPECIMEN RETRIEVAL 10MM (ENDOMECHANICALS) ×2 IMPLANT
SET BASIN LINEN APH (SET/KITS/TRAYS/PACK) ×2 IMPLANT
SET TUBE IRRIG SUCTION NO TIP (IRRIGATION / IRRIGATOR) ×2 IMPLANT
SLEEVE Z-THREAD 5X100MM (TROCAR) ×4 IMPLANT
STRIP CLOSURE SKIN 1/2X4 (GAUZE/BANDAGES/DRESSINGS) ×2 IMPLANT
SUT MNCRL AB 4-0 PS2 18 (SUTURE) ×4 IMPLANT
SUT VIC AB 2-0 CT2 27 (SUTURE) ×4 IMPLANT
SYR 30ML LL (SYRINGE) ×2 IMPLANT
TROCAR Z-THRD FIOS HNDL 11X100 (TROCAR) ×2 IMPLANT
TROCAR Z-THREAD FIOS 5X100MM (TROCAR) ×2 IMPLANT
WARMER LAPAROSCOPE (MISCELLANEOUS) ×2 IMPLANT

## 2012-01-23 NOTE — Plan of Care (Signed)
Problem: Phase I Progression Outcomes Goal: OOB as tolerated unless otherwise ordered Outcome: Progressing Pt ambulated approximately 100 feet in hallway.  Pt tolerated well.

## 2012-01-23 NOTE — Plan of Care (Signed)
Problem: Phase I Progression Outcomes Goal: Incision/dressings dry and intact Outcome: Completed/Met Date Met:  01/23/12 Steristrips clean, dry and intact to port sites x4

## 2012-01-23 NOTE — Progress Notes (Signed)
Ur chart review completed.  

## 2012-01-23 NOTE — Op Note (Signed)
Patient:  Justin Schmidt  DOB:  1980-12-25  MRN:  [id number]   Preop Diagnosis:  Acute cholecystitis  Postop Diagnosis:  The same  Procedure:  Laparoscopic cholecystectomy  Surgeon:  Dr. Tilford Pillar  Anes:  General endotracheal, 0.5% Sensorcaine plain for local  Indications:  Patient is a 31 year old male presented to [facility] the right upper quadrant abdominal pain. Workup and evaluation was consistent for acute cholecystitis. Risks benefits alternatives of a laparoscopic possible open cholecystectomy were discussed at length the patient including but not limited to risk of bleeding, infection, bile leak, small bowel injury, common bile duct injury, intraoperative cardiac and pulmonary events. Patient's questions and concerns are addressed the patient was consented for the planned procedure.  Procedure note:  Patient is taken to the operator is placed in a supine position on the OR table time the general anesthetic is a Optician, dispensing. Once patient was asleep he was endotracheally intubated by the nurse anesthetist. At this point his abdomen is prepped with DuraPrep solution and draped in standard fashion. A stab incision was created subumbilically with 11 blade scalpel with additional dissection carried out using a Coker clamp. This is utilized grasp the anterior normal Meschke and lift his anteriorly. A Veress needle is inserted saline drop test is utilized confirm intraperitoneal placement the pneumoperitoneum was initiated. Once sufficient pneumoperitoneum was obtained an 11 mm insert over a laparoscope allowing visualization the trocar entering into the peritoneal cavity. At this point the inner cannulas removed the laparoscope was reinserted there is no evidence a trocar peritoneal placement injury. At this time the remaining trochars replaced a 5 mm in the epigastrium, 5 mm in the midline, and a 5 mm in the right lateral abdominal Chismar. Patient's placed into a reverse Trendelenburg left  lateral decubitus position. The gallbladder is noted be taught and is therefore drain with a Weck needle. Clear yellow-green bile is obtained. At this point the gallbladder was lifted up and over the right lobe the liver. The peritoneal reflection is stripped off the infundibulum exposing both the cystic duct and cystic artery is entered into the infundibulum. Endoclips utilized to ligate both structures and the structures divided between 2 most distal clips. At this point the gallbladder is dissected off the gallbladder fossa using electrocautery. Once it is free is placed into an Endo Catch bag and placed up and over the right lobe the liver. To facilitate this the 10 mm scope is exchanged for a 5 mm scope. Hemostasis was obtained in the gallbladder fossa. Gait copiously irrigate the field with sterile saline until the returning aspirate was clear. Due to the raw nature of the gallbladder fossa I did opt to place a piece of Surgicel snow into the gallbladder fossa. At this time I turned my attention to closure.  Using Endo Close suture passing device a 2-0 Vicryl sutures passed to the umbilical trocar site. With this suture and placed I inspected the endoclips one final time noted some position with no is any bleeding or bile leak. At this time the the gallbladder is retrieved was removed through the umbilical trocar site and intact Endo Catch bag. In order to facilitate removing the gallbladder I did need to increase in size of the trocar site sharply. The gallbladder is placed in the back table sent as a perm specimen to pathology. At this time the pneumoperitoneum was evacuated. The Vicryl suture was secured. I did opt to place an additional Vicryl suture for closure of the fascia. The  local anesthetic was instilled. The skin edges at all 4 trocar sites reapproximated using a 4-0 Monocryl. The skin was washed dried moist dry towel. Benzoin is applied around incision half-inch are suture placed. The drapes  removed the patient was allowed to come out of general anesthetic and was transferred to the PACU in stable condition. At the conclusion of the procedure the instrument, sponge, needle counts were correct. Patient tolerated procedure extremely well.  Complications:  None apparent  EBL:  Less than 100 ML's  Specimen:  Gallbladder

## 2012-01-23 NOTE — Anesthesia Preprocedure Evaluation (Signed)
Anesthesia Evaluation  Patient identified by MRN, date of birth, ID band Patient awake    Reviewed: Allergy & Precautions, H&P , NPO status , Patient's Chart, lab work & pertinent test results  Airway Mallampati: III TM Distance: >3 FB     Dental  (+) Teeth Intact   Pulmonary neg pulmonary ROS,  breath sounds clear to auscultation        Cardiovascular negative cardio ROS  Rhythm:Regular     Neuro/Psych    GI/Hepatic   Endo/Other  Morbid obesity  Renal/GU      Musculoskeletal   Abdominal   Peds  Hematology   Anesthesia Other Findings   Reproductive/Obstetrics                           Anesthesia Physical Anesthesia Plan  ASA: II  Anesthesia Plan: General   Post-op Pain Management:    Induction: Intravenous, Rapid sequence and Cricoid pressure planned  Airway Management Planned: Oral ETT  Additional Equipment:   Intra-op Plan:   Post-operative Plan: Extubation in OR  Informed Consent: I have reviewed the patients History and Physical, chart, labs and discussed the procedure including the risks, benefits and alternatives for the proposed anesthesia with the patient or authorized representative who has indicated his/her understanding and acceptance.     Plan Discussed with:   Anesthesia Plan Comments:         Anesthesia Quick Evaluation

## 2012-01-23 NOTE — Anesthesia Postprocedure Evaluation (Signed)
  Anesthesia Post-op Note  Patient: Justin Schmidt  Procedure(s) Performed: Procedure(s) (LRB) with comments: LAPAROSCOPIC CHOLECYSTECTOMY (N/A)  Patient Location: PACU  Anesthesia Type: General  Level of Consciousness: awake, alert , oriented and patient cooperative  Airway and Oxygen Therapy: Patient Spontanous Breathing  Post-op Pain: 3 /10, mild  Post-op Assessment: Post-op Vital signs reviewed, Patient's Cardiovascular Status Stable, Respiratory Function Stable, Patent Airway, No signs of Nausea or vomiting and Pain level controlled  Post-op Vital Signs: Reviewed and stable  Complications: No apparent anesthesia complications

## 2012-01-23 NOTE — Plan of Care (Signed)
Problem: Phase I Progression Outcomes Goal: Initial discharge plan identified Outcome: Completed/Met Date Met:  01/23/12 Plans to return home at discharge.

## 2012-01-23 NOTE — Progress Notes (Signed)
Day of Surgery  Subjective: No acute change. Right upper quadrant abdominal pain is still minimal. Patient's questions and concerns are addressed.  Objective: Vital signs in last 24 hours: Temp:  [97.4 F (36.3 Schmidt)-98.2 F (36.8 Schmidt)] 98.2 F (36.8 Schmidt) (09/27 0817) Pulse Rate:  [64-81] 64  (09/27 0817) Resp:  [16-18] 16  (09/27 0817) BP: (108-144)/(60-79) 108/60 mmHg (09/27 0817) SpO2:  [96 %-100 %] 96 % (09/27 0817) Weight:  [147.056 kg (324 lb 3.2 oz)] 147.056 kg (324 lb 3.2 oz) (09/27 0407) Last BM Date: 01/22/12  Intake/Output from previous day: 09/26 0701 - 09/27 0700 In: 4387.1 [P.O.:1500; I.V.:2877.1; IV Piggyback:10] Out: -  Intake/Output this shift:    General appearance: alert and no distress GI: Positive right upper quadrant normal tenderness. No diffuse peritoneal signs.  Lab Results:   Basename 01/22/12 0556 01/21/12 1008  WBC 8.2 6.0  HGB 14.0 14.2  HCT 40.3 40.2  PLT 242 246   BMET  Basename 01/22/12 0556 01/21/12 1008  NA 137 137  K 3.7 4.0  CL 101 101  CO2 28 28  GLUCOSE 117* 108*  BUN 11 16  CREATININE 1.13 1.17  CALCIUM 9.3 9.7   PT/INR No results found for this basename: LABPROT:2,INR:2 in the last 72 hours ABG No results found for this basename: PHART:2,PCO2:2,PO2:2,HCO3:2 in the last 72 hours  Studies/Results: Dg Chest 2 View  01/21/2012  *RADIOLOGY REPORT*  Clinical Data: Chest pain  CHEST - 2 VIEW  Comparison: None.  Findings: Cardiomediastinal silhouette is unremarkable.  No acute infiltrate or pleural effusion.  No pulmonary edema.  Bony thorax is unremarkable.  IMPRESSION: No active disease.   Original Report Authenticated By: Natasha Mead, M.D.     Anti-infectives: Anti-infectives     Start     Dose/Rate Route Frequency Ordered Stop   01/23/12 0900   ceFAZolin (ANCEF) IVPB 1 g/50 mL premix        1 g 100 mL/hr over 30 Minutes Intravenous 60 min pre-op 01/23/12 0834     01/23/12 0900   ceFAZolin (ANCEF) IVPB 2 g/50 mL premix        2 g 100 mL/hr over 30 Minutes Intravenous 60 min pre-op 01/23/12 0834     01/23/12 0830   ceFAZolin (ANCEF) 3 g in dextrose 5 % 50 mL IVPB  Status:  Discontinued        3 g 160 mL/hr over 30 Minutes Intravenous 60 min pre-op 01/23/12 0830 01/23/12 4098          Assessment/Plan: s/p Procedure(s) (LRB) with comments: LAPAROSCOPIC CHOLECYSTECTOMY (N/A) Acute cholecystitis cholelithiasis. Risks benefits alternatives of a laparoscopic possible open cholecystectomy again were discussed. Patient's questions and concerns are addressed. Patient was consented and we'll proceed to the operating room for the planned laparoscopic cholecystectomy.  LOS: 2 days    Justin Schmidt 01/23/2012

## 2012-01-23 NOTE — Transfer of Care (Signed)
Immediate Anesthesia Transfer of Care Note  Patient: Justin Schmidt  Procedure(s) Performed: Procedure(s) (LRB) with comments: LAPAROSCOPIC CHOLECYSTECTOMY (N/A)  Patient Location: PACU  Anesthesia Type: General  Level of Consciousness: awake, alert  and patient cooperative  Airway & Oxygen Therapy: Patient Spontanous Breathing and Patient connected to face mask oxygen  Post-op Assessment: Report given to PACU RN, Post -op Vital signs reviewed and stable and Patient moving all extremities  Post vital signs: Reviewed and stable  Complications: No apparent anesthesia complications

## 2012-01-23 NOTE — Anesthesia Procedure Notes (Signed)
Procedure Name: Intubation Date/Time: 01/23/2012 9:38 AM Performed by: Despina Hidden Pre-anesthesia Checklist: Suction available, Emergency Drugs available, Patient identified and Patient being monitored Patient Re-evaluated:Patient Re-evaluated prior to inductionOxygen Delivery Method: Circle system utilized Preoxygenation: Pre-oxygenation with 100% oxygen Intubation Type: IV induction, Cricoid Pressure applied and Rapid sequence Ventilation: Mask ventilation without difficulty Laryngoscope Size: 3 and Mac Grade View: Grade I Tube type: Oral Tube size: 8.0 mm Number of attempts: 1 Airway Equipment and Method: Stylet Placement Confirmation: ETT inserted through vocal cords under direct vision,  positive ETCO2 and breath sounds checked- equal and bilateral Secured at: 22 cm Tube secured with: Tape Dental Injury: Teeth and Oropharynx as per pre-operative assessment  Difficulty Due To: Difficulty was anticipated and Difficult Airway- due to limited oral opening

## 2012-01-24 NOTE — Progress Notes (Signed)
1 Day Post-Op  Subjective: Pain control. Tolerating clear liquids. Some difficulty changing position otherwise doing much better. No fevers or chills.  Objective: Vital signs in last 24 hours: Temp:  [97.7 F (36.5 C)-98.4 F (36.9 C)] 97.8 F (36.6 C) (09/28 0500) Pulse Rate:  [60-85] 72  (09/28 0500) Resp:  [16-18] 16  (09/28 0500) BP: (96-122)/(58-73) 100/61 mmHg (09/28 0500) SpO2:  [92 %-100 %] 100 % (09/28 0500) Last BM Date: 01/22/12  Intake/Output from previous day: 09/27 0701 - 09/28 0700 In: 1180 [P.O.:480; I.V.:700] Out: -  Intake/Output this shift: Total I/O In: 360 [P.O.:360] Out: -   General appearance: alert and no distress Cardio: regular rate and rhythm GI: Positive bowel sounds, soft, flat, expected postoperative tenderness. Vision is clean dry and intact  Lab Results:   Basename 01/22/12 0556  WBC 8.2  HGB 14.0  HCT 40.3  PLT 242   BMET  Basename 01/22/12 0556  NA 137  K 3.7  CL 101  CO2 28  GLUCOSE 117*  BUN 11  CREATININE 1.13  CALCIUM 9.3   PT/INR No results found for this basename: LABPROT:2,INR:2 in the last 72 hours ABG No results found for this basename: PHART:2,PCO2:2,PO2:2,HCO3:2 in the last 72 hours  Studies/Results: No results found.  Anti-infectives: Anti-infectives     Start     Dose/Rate Route Frequency Ordered Stop   01/23/12 0900   ceFAZolin (ANCEF) IVPB 1 g/50 mL premix        1 g 100 mL/hr over 30 Minutes Intravenous 60 min pre-op 01/23/12 0834 01/23/12 0930   01/23/12 0900   ceFAZolin (ANCEF) IVPB 2 g/50 mL premix        2 g 100 mL/hr over 30 Minutes Intravenous 60 min pre-op 01/23/12 0834 01/23/12 0930   01/23/12 0830   ceFAZolin (ANCEF) 3 g in dextrose 5 % 50 mL IVPB  Status:  Discontinued        3 g 160 mL/hr over 30 Minutes Intravenous 60 min pre-op 01/23/12 0830 01/23/12 4098          Assessment/Plan: s/p Procedure(s) (LRB) with comments: LAPAROSCOPIC CHOLECYSTECTOMY (N/A) Doing well. Continue to  advance diet. Continued increase activity. We'll hep lock patient and plan for discharge  LOS: 3 days    Davarious Tumbleson C 01/24/2012

## 2012-01-24 NOTE — Addendum Note (Signed)
Addendum  created 01/24/12 1158 by Refugia Laneve J Diondra Pines, CRNA   Modules edited:Notes Section    

## 2012-01-24 NOTE — Anesthesia Postprocedure Evaluation (Signed)
  Anesthesia Post-op Note  Patient: Justin Schmidt  Procedure(s) Performed: Procedure(s) (LRB) with comments: LAPAROSCOPIC CHOLECYSTECTOMY (N/A)  Patient Location: room 322  Anesthesia Type: General  Level of Consciousness: awake, alert , oriented and patient cooperative  Airway and Oxygen Therapy: Patient Spontanous Breathing  Post-op Pain: 3 /10, mild  Post-op Assessment: Post-op Vital signs reviewed, Patient's Cardiovascular Status Stable, Respiratory Function Stable, Patent Airway, No signs of Nausea or vomiting, Adequate PO intake and Pain level controlled  Post-op Vital Signs: Reviewed and stable  Complications: No apparent anesthesia complications

## 2012-01-25 MED ORDER — PANTOPRAZOLE SODIUM 40 MG PO TBEC: 40.0000 mg | DELAYED_RELEASE_TABLET | Freq: Every day | ORAL | Status: DC

## 2012-01-25 MED ORDER — HYDROCODONE-ACETAMINOPHEN 5-325 MG PO TABS: 1.0000 | ORAL_TABLET | ORAL | Status: DC | PRN

## 2012-01-25 NOTE — Discharge Summary (Signed)
Physician Discharge Summary  Patient ID: Justin Schmidt MRN: [id number] DOB/AGE: 32-26-1982 31 y.o.  Admit date: 01/21/2012 Discharge date: 01/25/2012  Admission Diagnoses: Acute cholecystitis  Discharge Diagnoses: Same Active Problems:  * No active hospital problems. *    Discharged Condition: stable  Hospital Course: Patient presented to [facility] for right upper quadrant abdominal pain. Workup and evaluation was consistent for acute cholecystitis. Patient was taken to the operating room underwent a successful laparoscopic cholecystectomy. Patient's recovery was uneventful. His pain continues to improve. He was tolerating regular diet. Plans are made for discharge.  Consults: None  Significant Diagnostic Studies: radiology: CT scan: Abdomen and pelvis  Treatments: surgery: Laparoscopic cholecystectomy  Discharge Exam: Blood pressure 106/71, pulse 72, temperature 97.6 F (36.4 C), temperature source Oral, resp. rate 20, height 5\' 9"  (1.753 m), weight 147.056 kg (324 lb 3.2 oz), SpO2 96.00%. General appearance: alert and no distress Resp: clear to auscultation bilaterally Cardio: regular rate and rhythm GI: Is about sounds, soft, flat, expected moderate tenderness. Incisions are clean dry and intact the  Disposition:   Discharge Orders    Future Orders Please Complete By Expires   Diet - low sodium heart healthy      Increase activity slowly      Discharge instructions      Comments:   Increase activity as tolerated. May place ice pack for comfort.  Alternate an anti-inflammatory such as ibuprofen (Motrin, Advil) 400-600mg  every 6 hours with the prescribed pain medication.   Do not take any additional acetaminophen as there is Tylenol in the pain medication.   Driving Restrictions      Comments:   No driving while on pain medications.   Lifting restrictions      Comments:   No lifting over 20lbs for 4-5 weeks post-op.   Discharge wound care:      Comments:     Clean surgical sites with soap and water.  May shower the morning after surgery unless instructed by Dr. Leticia Penna otherwise.  No soaking for 2-3 weeks.    If adhesive strips are in place, they may be removed in 1-2 weeks while in the shower.   Call MD for:  temperature >100.4      Call MD for:  persistant nausea and vomiting      Call MD for:  severe uncontrolled pain      Call MD for:  redness, tenderness, or signs of infection (pain, swelling, redness, odor or green/yellow discharge around incision site)          Medication List     As of 01/25/2012 10:06 AM    TAKE these medications         HYDROcodone-acetaminophen 5-325 MG per tablet   Commonly known as: NORCO/VICODIN   Take 1-2 tablets by mouth every 4 (four) hours as needed.      ibuprofen 200 MG tablet   Commonly known as: ADVIL,MOTRIN   Take 200 mg by mouth every 6 (six) hours as needed. Pain      multivitamin with minerals Tabs   Take 1 tablet by mouth daily.         SignedFabio Bering 01/25/2012, 10:06 AM

## 2012-01-25 NOTE — Progress Notes (Signed)
The patient is receiving Protonix by the intravenous route.  Based on criteria approved by the Pharmacy and Therapeutics Committee and the Medical Executive Committee, the medication is being converted to the equivalent oral dose form.  These criteria include: -No Active GI bleeding -Able to tolerate diet of full liquids (or better) or tube feeding OR able to tolerate other medications by the oral or enteral route  If you have any questions about this conversion, please contact the Pharmacy Department (ext [phone]).  Thank you.  Elson Clan, [facility] 01/25/2012 9:16 AM

## 2012-01-25 NOTE — Progress Notes (Signed)
Pt ready for d/c. Went over d/c paperwork with pt. Instructed on incision site care, weight lifting restrictions, and new medications. D/c'd PIV WNL.

## 2012-01-27 ENCOUNTER — Encounter (HOSPITAL_COMMUNITY): Payer: Self-pay | Admitting: General Surgery

## 2012-01-27 MED ORDER — SODIUM CHLORIDE 0.9 % IR SOLN
Status: AC | PRN
  Administered 2012-01-27: 3000 mL

## 2012-02-01 ENCOUNTER — Emergency Department (HOSPITAL_COMMUNITY): Payer: Medicare Other

## 2012-02-01 ENCOUNTER — Emergency Department (HOSPITAL_COMMUNITY)
Admission: EM | Admit: 2012-02-01 | Discharge: 2012-02-01 | Disposition: A | Discharge status: Home / Self Care | Payer: Medicare Other | Attending: Emergency Medicine | Admitting: Emergency Medicine

## 2012-02-01 ENCOUNTER — Encounter (HOSPITAL_COMMUNITY): Payer: Self-pay | Admitting: Emergency Medicine

## 2012-02-01 DIAGNOSIS — R609 Edema, unspecified: Secondary | ICD-10-CM | POA: Diagnosis not present

## 2012-02-01 DIAGNOSIS — M84376A Stress fracture, unspecified foot, initial encounter for fracture: Secondary | ICD-10-CM | POA: Diagnosis not present

## 2012-02-01 DIAGNOSIS — M79609 Pain in unspecified limb: Secondary | ICD-10-CM | POA: Diagnosis not present

## 2012-02-01 DIAGNOSIS — Z9089 Acquired absence of other organs: Secondary | ICD-10-CM | POA: Diagnosis not present

## 2012-02-01 DIAGNOSIS — S92309A Fracture of unspecified metatarsal bone(s), unspecified foot, initial encounter for closed fracture: Secondary | ICD-10-CM | POA: Diagnosis not present

## 2012-02-01 DIAGNOSIS — E669 Obesity, unspecified: Secondary | ICD-10-CM | POA: Diagnosis not present

## 2012-02-01 MED ORDER — OXYCODONE-ACETAMINOPHEN 5-325 MG PO TABS: 1.0000 | ORAL_TABLET | ORAL | Status: AC | PRN

## 2012-02-01 MED ORDER — OXYCODONE-ACETAMINOPHEN 5-325 MG PO TABS
1.0000 | ORAL_TABLET | Freq: Once | ORAL | Status: AC
  Administered 2012-02-01: 1 via ORAL
  Filled 2012-02-01: qty 1

## 2012-02-01 NOTE — ED Notes (Signed)
Patient c/o right foot pain since Tuesday. Patient denies any known injury. Per patient hurt after working all day with family member for first time after gallbladder surgery a week ago. Patient reports swelling to foot.

## 2012-02-02 NOTE — ED Provider Notes (Signed)
History     CSN: [id number]  Arrival date & time 02/01/12  1151   First MD Initiated Contact with Patient 02/01/12 1155      Chief Complaint  Patient presents with  . Foot Pain    (Consider location/radiation/quality/duration/timing/severity/associated sxs/prior treatment) HPI Comments: Justin Schmidt presents with right foot pain and swelling for the past 5 days.  He describes walking into a field near his home to assist his grandfather with a problem with a tractor.  He does not recall specifically tripping or injuring his foot,  But the next morning woke with pain and swelling over the lateral right foot.  He also states he has a tight sensation and "pulling" in his lower right achilles tendon with foot flexion.  He denies pain or swelling in his right calf.  He is currently recovering from a lap chole procedure on 01/23/12.  He denies any pain or problems at the site of his surgery.He has taken ibuprofen and elevated the foot without relief of pain.  It is worse with palpation and weight bearing.  The history is provided by the patient.    Past Medical History  Diagnosis Date  . Gallstones     Past Surgical History  Procedure Date  . Appendectomy   . Cholecystectomy 01/23/2012    Procedure: LAPAROSCOPIC CHOLECYSTECTOMY;  Surgeon: Fabio Bering, MD;  Location: [facility];  Service: General;  Laterality: N/A;    Family History  Problem Relation Age of Onset  . Diabetes Mother   . Diabetes Other     History  Substance Use Topics  . Smoking status: Never Smoker   . Smokeless tobacco: Never Used  . Alcohol Use: No     occasional      Review of Systems  Respiratory: Negative for shortness of breath.   Musculoskeletal: Positive for joint swelling and arthralgias.  Skin: Negative for wound.  Neurological: Negative for weakness and numbness.    Allergies  Review of patient's allergies indicates no known allergies.  Home Medications   Current Outpatient Rx  Name Route  Sig Dispense Refill  . DIPHENHYDRAMINE-APAP (SLEEP) 25-500 MG PO TABS Oral Take 1 tablet by mouth at bedtime as needed. Pain.    . IBUPROFEN 200 MG PO TABS Oral Take 400 mg by mouth every 6 (six) hours as needed. Pain    . ADULT MULTIVITAMIN W/MINERALS CH Oral Take 1 tablet by mouth daily.    . OXYCODONE-ACETAMINOPHEN 5-325 MG PO TABS Oral Take 1 tablet by mouth every 4 (four) hours as needed for pain. 20 tablet 0    BP 127/78  Pulse 95  Temp 98.1 F (36.7 C) (Oral)  Resp 18  Ht 5\' 9"  (1.753 m)  Wt 305 lb (138.347 kg)  BMI 45.04 kg/m2  SpO2 98%  Physical Exam  Vitals reviewed. Constitutional: He appears well-developed and well-nourished.       Obese  HENT:  Head: Atraumatic.  Neck: Normal range of motion.  Cardiovascular: Normal rate.        Pulses equal bilaterally  Pulmonary/Chest: Effort normal and breath sounds normal. No respiratory distress.  Musculoskeletal: He exhibits tenderness.       Right foot: He exhibits bony tenderness and swelling. He exhibits normal capillary refill and no deformity.       Feet:       He is tender to palpation distal achilles tendon(right) with no edema,  No stepoffs or deformity.  No calf ttp.  Trace edema bilateral dorsal  feet and ankles.  Right foot slightly more edematous than left.  Point tender over proxima 4th and 5th metatarsals.  Mild blanching erythema dorsal right foot.  Neurological: He is alert. He has normal strength. He displays normal reflexes. No sensory deficit.       Equal strength  Skin: Skin is warm and dry.  Psychiatric: He has a normal mood and affect.    ED Course  Procedures (including critical care time)  Labs Reviewed - No data to display Dg Foot Complete Right  02/01/2012  *RADIOLOGY REPORT*  Clinical Data: Foot pain with lateral swelling.  RIGHT FOOT COMPLETE - 3+ VIEW  Comparison: None.  Findings: No malalignment at the Lisfranc joint.  There is some vague sclerosis proximally in the metadiaphysis of the fourth  metatarsal which could be a stress fracture.  Dorsal soft tissue swelling noted.  No overt cortical discontinuity.  IMPRESSION:  1.  Faint band of bony sclerosis in the proximal metadiaphysis of the fourth metatarsal is suspicious for stress fracture given the patient's history. 2.  Dorsal subcutaneous edema.   Original Report Authenticated By: Dellia Cloud, M.D.      1. Stress fracture of metatarsal bone       MDM  Pt placed in cam walker, crutches given.  Oxycodone, elevation, heat.  Referral to ortho for recheck appt.    Definitive care of fracture provided in ed.            Burgess Amor, [place] 02/02/12 2310

## 2012-02-03 NOTE — ED Provider Notes (Signed)
Medical screening examination/treatment/procedure(s) were conducted as a shared visit with non-physician practitioner(s) and myself.  I personally evaluated the patient during the encounter   Benny Lennert, MD 02/03/12 1105

## 2012-03-04 DIAGNOSIS — M25579 Pain in unspecified ankle and joints of unspecified foot: Secondary | ICD-10-CM | POA: Diagnosis not present

## 2012-03-19 DIAGNOSIS — M84376A Stress fracture, unspecified foot, initial encounter for fracture: Secondary | ICD-10-CM | POA: Diagnosis not present

## 2012-04-09 DIAGNOSIS — M84376A Stress fracture, unspecified foot, initial encounter for fracture: Secondary | ICD-10-CM | POA: Diagnosis not present

## 2012-10-21 ENCOUNTER — Other Ambulatory Visit: Payer: Self-pay

## 2012-10-21 MED ORDER — FUROSEMIDE 20 MG PO TABS: 20.0000 mg | ORAL_TABLET | Freq: Every day | ORAL | Status: DC

## 2012-10-21 NOTE — Telephone Encounter (Signed)
Last seen 01/09/12  ACM

## 2013-05-19 ENCOUNTER — Telehealth: Payer: Self-pay | Admitting: Family Medicine

## 2013-05-19 NOTE — Telephone Encounter (Signed)
Patient advised not to take expired medication and that he would need to be seen for them to examine his back. Patient father stated that he would call back in the am if he decided to come in

## 2013-06-27 ENCOUNTER — Encounter (INDEPENDENT_AMBULATORY_CARE_PROVIDER_SITE_OTHER): Payer: Self-pay

## 2013-06-27 ENCOUNTER — Ambulatory Visit (INDEPENDENT_AMBULATORY_CARE_PROVIDER_SITE_OTHER): Payer: Medicare Other | Admitting: Family Medicine

## 2013-06-27 ENCOUNTER — Telehealth: Payer: Self-pay | Admitting: Nurse Practitioner

## 2013-06-27 ENCOUNTER — Encounter: Payer: Self-pay | Admitting: Family Medicine

## 2013-06-27 VITALS — BP 134/74 | HR 73 | Temp 98.0°F | Ht 68.0 in | Wt 348.0 lb

## 2013-06-27 DIAGNOSIS — R609 Edema, unspecified: Secondary | ICD-10-CM

## 2013-06-27 DIAGNOSIS — J329 Chronic sinusitis, unspecified: Secondary | ICD-10-CM | POA: Diagnosis not present

## 2013-06-27 MED ORDER — PHENTERMINE HCL 30 MG PO CAPS: 30.0000 mg | ORAL_CAPSULE | ORAL | Status: DC

## 2013-06-27 MED ORDER — AMOXICILLIN 875 MG PO TABS: 875.0000 mg | ORAL_TABLET | Freq: Two times a day (BID) | ORAL | Status: DC

## 2013-06-27 MED ORDER — FUROSEMIDE 20 MG PO TABS: 20.0000 mg | ORAL_TABLET | Freq: Every day | ORAL | Status: DC

## 2013-06-27 MED ORDER — FLUTICASONE PROPIONATE 50 MCG/ACT NA SUSP: 2.0000 | Freq: Every day | NASAL | Status: DC

## 2013-06-27 NOTE — Progress Notes (Signed)
   Subjective:    Patient ID: Justin Schmidt, male    DOB: [birth year], 33 y.o.   MRN: [id number]  HPI This 33 y.o. male presents for evaluation of headaches and sinus infection. He has been having sinus allergies and mucopurulent sinus drainage. He is having problems with swelling and obesity.   Review of Systems No chest pain, SOB, HA, dizziness, vision change, N/V, diarrhea, constipation, dysuria, urinary urgency or frequency, myalgias, arthralgias or rash.     Objective:   Physical Exam Vital signs noted  Well developed well nourished obese male.  HEENT - Head atraumatic Normocephalic                Eyes - PERRLA, Conjuctiva - clear Sclera- Clear EOMI                Ears - EAC's Wnl TM's Wnl Gross Hearing WNL                Nose - Nares patent                 Throat - oropharanx wnl Respiratory - Lungs CTA bilateral Cardiac - RRR S1 and S2 without murmur GI - Abdomen soft Nontender and bowel sounds active x 4 Extremities - No edema. Neuro - Grossly intact.       Assessment & Plan:  Sinusitis - Plan: fluticasone (FLONASE) 50 MCG/ACT nasal spray, amoxicillin (AMOXIL) 875 MG tablet po bid x 10 days #20.  Push po fluids, rest, tylenol and motrin otc prn as directed for fever, arthralgias, and myalgias.  Follow up prn if sx's continue or persist.  Edema - Plan: furosemide (LASIX) 20 MG tablet po qd prn swelling.  Obesity - Fastin 37.5mg  one po qd #30w/3rf  Follow up for CPE in next 2 weeks.  Deatra CanterWilliam J Oxford FNP

## 2013-06-27 NOTE — Telephone Encounter (Signed)
appt today at 3 with bill 

## 2013-06-27 NOTE — Patient Instructions (Signed)
Obesity Obesity is defined as having too much total body fat and a body mass index (BMI) of 30 or more. BMI is an estimate of body fat and is calculated from your height and weight. Obesity happens when you consume more calories than you can burn by exercising or performing daily physical tasks. Prolonged obesity can cause major illnesses or emergencies, such as:   A stroke.  Heart disease.  Diabetes.  Cancer.  Arthritis.  High blood pressure (hypertension).  High cholesterol.  Sleep apnea.  Erectile dysfunction.  Infertility problems. CAUSES   Regularly eating unhealthy foods.  Physical inactivity.  Certain disorders, such as an underactive thyroid (hypothyroidism), Cushing's syndrome, and polycystic ovarian syndrome.  Certain medicines, such as steroids, some depression medicines, and antipsychotics.  Genetics.  Lack of sleep. DIAGNOSIS  A caregiver can diagnose obesity after calculating your BMI. Obesity will be diagnosed if your BMI is 30 or higher.  There are other methods of measuring obesity levels. Some other methods include measuring your skin fold thickness, your waist circumference, and comparing your hip circumference to your waist circumference. TREATMENT  A healthy treatment program includes some or all of the following:  Long-term dietary changes.  Exercise and physical activity.  Behavioral and lifestyle changes.  Medicine only under the supervision of your caregiver. Medicines may help, but only if they are used with diet and exercise programs. An unhealthy treatment program includes:  Fasting.  Fad diets.  Supplements and drugs. These choices do not succeed in long-term weight control.  HOME CARE INSTRUCTIONS   Exercise and perform physical activity as directed by your caregiver. To increase physical activity, try the following:  Use stairs instead of elevators.  Park farther away from store entrances.  Garden, bike, or walk instead of  watching television or using the computer.  Eat healthy, low-calorie foods and drinks on a regular basis. Eat more fruits and vegetables. Use low-calorie cookbooks or take healthy cooking classes.  Limit fast food, sweets, and processed snack foods.  Eat smaller portions.  Keep a daily journal of everything you eat. There are many free websites to help you with this. It may be helpful to measure your foods so you can determine if you are eating the correct portion sizes.  Avoid drinking alcohol. Drink more water and drinks without calories.  Take vitamins and supplements only as recommended by your caregiver.  Weight-loss support groups, Registered Dieticians, counselors, and stress reduction education can also be very helpful. SEEK IMMEDIATE MEDICAL CARE IF:  You have chest pain or tightness.  You have trouble breathing or feel short of breath.  You have weakness or leg numbness.  You feel confused or have trouble talking.  You have sudden changes in your vision. MAKE SURE YOU:  Understand these instructions.  Will watch your condition.  Will get help right away if you are not doing well or get worse. Document Released: 05/22/2004 Document Revised: 10/14/2011 Document Reviewed: 05/21/2011 Exit[employer] Patient Information 2014 [employer].  

## 2013-07-08 ENCOUNTER — Telehealth: Payer: Self-pay | Admitting: Family Medicine

## 2013-07-08 NOTE — Telephone Encounter (Signed)
PATIENT FOUND HIS RX AND DOES NOT NEED ANOTHER ONE NOW

## 2013-07-18 ENCOUNTER — Encounter: Payer: Self-pay | Admitting: Family Medicine

## 2013-07-18 ENCOUNTER — Ambulatory Visit (INDEPENDENT_AMBULATORY_CARE_PROVIDER_SITE_OTHER): Payer: Medicare Other | Admitting: Family Medicine

## 2013-07-18 VITALS — BP 120/68 | HR 91 | Temp 98.2°F | Ht 68.0 in | Wt 349.0 lb

## 2013-07-18 DIAGNOSIS — Z0000 Encounter for general adult medical examination without abnormal findings: Secondary | ICD-10-CM

## 2013-07-18 DIAGNOSIS — R635 Abnormal weight gain: Secondary | ICD-10-CM

## 2013-07-18 DIAGNOSIS — R5383 Other fatigue: Secondary | ICD-10-CM | POA: Diagnosis not present

## 2013-07-18 DIAGNOSIS — E785 Hyperlipidemia, unspecified: Secondary | ICD-10-CM | POA: Diagnosis not present

## 2013-07-18 DIAGNOSIS — R5381 Other malaise: Secondary | ICD-10-CM | POA: Diagnosis not present

## 2013-07-18 LAB — POCT CBC
Granulocyte percent: 64.4 %G (ref 37–80)
HCT, POC: 43.4 % — AB (ref 43.5–53.7)
Hemoglobin: 13.9 g/dL — AB (ref 14.1–18.1)
Lymph, poc: 1.5 (ref 0.6–3.4)
MCH, POC: 29.9 pg (ref 27–31.2)
MCHC: 32 g/dL (ref 31.8–35.4)
MCV: 93.3 fL (ref 80–97)
MPV: 8.7 fL (ref 0–99.8)
POC Granulocyte: 3.5 (ref 2–6.9)
POC LYMPH PERCENT: 28 %L (ref 10–50)
Platelet Count, POC: 225 10*3/uL (ref 142–424)
RBC: 4.7 M/uL (ref 4.69–6.13)
RDW, POC: 14.2 %
WBC: 5.4 10*3/uL (ref 4.6–10.2)

## 2013-07-18 NOTE — Progress Notes (Signed)
   Subjective:    Patient ID: Justin Schmidt, male    DOB: 11/01/1980, 33 y.o.   MRN: [id number]  HPI This 33 y.o. male presents for evaluation of CPE.  He has morbid obesity.  He states he  Has been feeling fatigued and tired.  He has had a lot of weight gain after injury of his right Tibia when it was fractured when he dropped a weight on it.  He has been feeling some Depressed at times due to his weight and obesity.  He has been rx'd phenterimine and notices This helps decrease appetite and increases energy.   .   Review of Systems No chest pain, SOB, HA, dizziness, vision change, N/V, diarrhea, constipation, dysuria, urinary urgency or frequency, myalgias, arthralgias or rash.     Objective:   Physical Exam  Vital signs noted  Well developed well nourished obese male.  HEENT - Head atraumatic Normocephalic                Eyes - PERRLA, Conjuctiva - clear Sclera- Clear EOMI                Ears - EAC's Wnl TM's Wnl Gross Hearing WNL                Nose - Nares patent                 Throat - oropharanx wnl Respiratory - Lungs CTA bilateral Cardiac - RRR S1 and S2 without murmur GI - Abdomen soft Nontender and bowel sounds active x 4 Extremities - No edema. Neuro - Grossly intact.      Assessment & Plan:  Routine general medical examination at a health care facility CPE labs.  Recommend he get aerobic exercise 30 minutes a day for 5 days a week. Recommend he follow up with the clinical pharmacist for diet counseling and weight Loss counseling.    Follow up in one months  Deatra CanterWilliam J Oxford FNP

## 2013-07-19 LAB — CMP14+EGFR
ALT: 97 IU/L — ABNORMAL HIGH (ref 0–44)
AST: 40 IU/L (ref 0–40)
Albumin/Globulin Ratio: 2 (ref 1.1–2.5)
Albumin: 4.4 g/dL (ref 3.5–5.5)
Alkaline Phosphatase: 66 IU/L (ref 39–117)
BUN/Creatinine Ratio: 8 (ref 8–19)
BUN: 11 mg/dL (ref 6–20)
CO2: 24 mmol/L (ref 18–29)
Calcium: 9.5 mg/dL (ref 8.7–10.2)
Chloride: 102 mmol/L (ref 97–108)
Creatinine, Ser: 1.37 mg/dL — ABNORMAL HIGH (ref 0.76–1.27)
GFR calc Af Amer: 78 mL/min/{1.73_m2} (ref >59)
GFR calc non Af Amer: 67 mL/min/{1.73_m2} (ref >59)
Globulin, Total: 2.2 g/dL (ref 1.5–4.5)
Glucose: 84 mg/dL (ref 65–99)
Potassium: 4.1 mmol/L (ref 3.5–5.2)
Sodium: 143 mmol/L (ref 134–144)
Total Bilirubin: 0.3 mg/dL (ref 0.0–1.2)
Total Protein: 6.6 g/dL (ref 6.0–8.5)

## 2013-07-19 LAB — LIPID PANEL
Chol/HDL Ratio: 4.3 ratio units (ref 0.0–5.0)
Cholesterol, Total: 166 mg/dL (ref 100–199)
HDL: 39 mg/dL — ABNORMAL LOW (ref >39)
LDL Calculated: 94 mg/dL (ref 0–99)
Triglycerides: 163 mg/dL — ABNORMAL HIGH (ref 0–149)
VLDL Cholesterol Cal: 33 mg/dL (ref 5–40)

## 2013-07-19 LAB — TSH: TSH: 3.33 u[IU]/mL (ref 0.450–4.500)

## 2013-08-19 ENCOUNTER — Encounter: Payer: Self-pay | Admitting: Family Medicine

## 2013-08-19 ENCOUNTER — Ambulatory Visit (INDEPENDENT_AMBULATORY_CARE_PROVIDER_SITE_OTHER): Payer: Medicare Other | Admitting: Family Medicine

## 2013-08-19 VITALS — BP 124/75 | HR 95 | Temp 98.0°F | Ht 68.0 in | Wt 341.0 lb

## 2013-08-19 DIAGNOSIS — R51 Headache: Secondary | ICD-10-CM | POA: Diagnosis not present

## 2013-08-19 DIAGNOSIS — R634 Abnormal weight loss: Secondary | ICD-10-CM

## 2013-08-19 NOTE — Progress Notes (Signed)
   Subjective:    Patient ID: Justin Schmidt, male    DOB: 11/26/1980, 33 y.o.   MRN: [id number]  HPI This 33 y.o. male presents for evaluation of obesity and weight loss.  He is taking phenterimine and has lost 9 pounds over the last month. He has been having headaches.   Review of Systems C/o headaches   No chest pain, SOB, HA, dizziness, vision change, N/V, diarrhea, constipation, dysuria, urinary urgency or frequency, myalgias, arthralgias or rash.  Objective:   Physical Exam  Vital signs noted  Well developed well nourished obese male.  HEENT - Head atraumatic Normocephalic                Eyes - PERRLA, Conjuctiva - clear Sclera- Clear EOMI                Ears - EAC's Wnl TM's Wnl Gross Hearing WNL                Nose - Nares patent                 Throat - oropharanx wnl Respiratory - Lungs CTA bilateral Cardiac - RRR S1 and S2 without murmur GI - Abdomen soft Nontender and bowel sounds active x 4 Extremities - No edema. Neuro - Grossly intact.      Assessment & Plan:  Loss of weight Discussed decreasing carb diet to 2 servings per day and then increasing aerobic exercise And discussed getting appointment with Gustavus Bryantammy Eckard Pharm D for diet counseling Continue phenterimine.  Headaches - Take tylenol and motrin otc as directed.  Discussed his headaches are likely tension headaches.  Deatra CanterWilliam J Latesha Chesney FNP

## 2013-09-05 ENCOUNTER — Encounter: Payer: Self-pay | Admitting: Pharmacist

## 2013-09-05 ENCOUNTER — Ambulatory Visit (INDEPENDENT_AMBULATORY_CARE_PROVIDER_SITE_OTHER): Payer: Medicare Other | Admitting: Pharmacist

## 2013-09-05 DIAGNOSIS — E785 Hyperlipidemia, unspecified: Secondary | ICD-10-CM | POA: Insufficient documentation

## 2013-09-05 DIAGNOSIS — E781 Pure hyperglyceridemia: Secondary | ICD-10-CM

## 2013-09-05 DIAGNOSIS — E1169 Type 2 diabetes mellitus with other specified complication: Secondary | ICD-10-CM | POA: Insufficient documentation

## 2013-09-05 NOTE — Patient Instructions (Signed)
Wendy's meal builder - wendys.com  [email or web address].com - a phone app where you can track calories and exercise.  Eat more - greens beans, broccoli, greens, lettuce, carrots, cabbage, spinach, peppers and onions, zucchini or yellow squash, asparagus, cauliflower, tomatoes.   Limit or no regular soft drinks,   Meat serving size =  palm sized

## 2013-09-05 NOTE — Progress Notes (Signed)
Subjective:     Wallace CullensJames C Roberg is a 33 y.o. male here for discussion regarding weight loss/obesity. He has noted a weight gain of approximately 30 pounds over the last 2 years. He feels ideal weight is 225 pounds. History of eating disorders: none. There is a family history positive for obesity in the patient, father and brother. Previous treatments for obesity include self-directed dieting. Obesity associated medical conditions: none. Obesity associated medications: none. Cardiovascular risk factors besides obesity: dyslipidemia, male gender and obesity (BMI >= 30 kg/m2).  Has been taking phentermine 30mg  with good results.  Denies any difficulty sleeping or increased HR. Diet - eggs, Equate replacement shakes, eats out at [employer], [employer], [employer], [employer]  Drinks water mostly but sometimes cherry coke  Tries to limit bread  Exercise - lifts weights (2-3 times a week) and Karate (1-2 times a week)  The following portions of the patient's history were reviewed and updated as appropriate: allergies, current medications, past family history, past medical history, past social history, past surgical history and problem list.   Objective:    Body mass index is 51.56 kg/(m^2). Filed Vitals:   09/05/13 1448  BP: 124/72  Pulse: 76   Filed Weights   09/05/13 1448  Weight: 339 lb (153.769 kg)    Assessment:    Obesity. I assessed [name] to be in an action stage with respect to weight loss.  Hypertriglyceridemia Metabolic syndrome with family history of diabetes    Plan:    General weight loss/lifestyle modification strategies discussed (elicit support from others; identify saboteurs; non-food rewards, etc). Behavioral treatment: using smaller plates, eating slower. Diet interventions: moderate (500 kCal/d) deficit diet and reviewed eating out options.  Recommended fitness pal app to record food/calories and exercise.. Regular aerobic exercise program discussed. Medication:  phentermine. RTC in 6 weeks   Henrene Pastorammy Rechy Bost, PharmD, CPP

## 2013-10-24 ENCOUNTER — Ambulatory Visit (INDEPENDENT_AMBULATORY_CARE_PROVIDER_SITE_OTHER): Payer: Medicare Other | Admitting: Pharmacist

## 2013-10-24 ENCOUNTER — Encounter: Payer: Self-pay | Admitting: Pharmacist

## 2013-10-24 VITALS — BP 116/76 | HR 76 | Ht 68.0 in | Wt 327.0 lb

## 2013-10-24 DIAGNOSIS — E8881 Metabolic syndrome: Secondary | ICD-10-CM | POA: Insufficient documentation

## 2013-10-24 NOTE — Progress Notes (Signed)
Subjective:     Justin Schmidt is a 33 y.o. male here for discussion regarding weight loss/obesity. He had noted a weight gain of approximately 30 pounds over the last 2 years prior to our first visit.  He has been taking phentermine 30mg  1 capsule daily and has lost 12# over the last 6 weeks . He feels ideal weight is 225 pounds. History of eating disorders: none. There is a family history positive for obesity in the patient, father and brother. Previous treatments for obesity include self-directed dieting. Obesity associated medical conditions: none. Obesity associated medications: none. Cardiovascular risk factors besides obesity: dyslipidemia, male gender and obesity (BMI >= 30 kg/m2).  Has been taking phentermine 30mg  with good results.  Denies increased HR. Report occasional difficulty sleeping which he contributes to stress from taking care of grandfather.    Diet - eggs, Equate or Slim Fast replacement shakes, eating out less over the last 6 weeks.  More vegetables and lean proteins - tuna fish subs and less beef  Drinks water mostly - no more sugary drinks  Tries to limit bread  Exercise - lifts weights (2-3 times a week) and Karate (1-2 times a week)  The following portions of the patient's history were reviewed and updated as appropriate: allergies, current medications, past family history, past medical history, past social history, past surgical history and problem list.   Objective:    Body mass index is 49.73 kg/(m^2). Filed Vitals:   10/24/13 1444  BP: 116/76  Pulse: 76   Filed Weights   10/24/13 1444  Weight: 327 lb (148.326 kg)    Assessment:    Obesity. I assessed [name] to be in an action stage with respect to weight loss. He has lost 12# over last 6 weeks! Hypertriglyceridemia - due to recheck but patient not fasting today. Metabolic syndrome with family history of diabetes    Plan:    General weight loss/lifestyle modification strategies discussed (elicit  support from others; identify saboteurs; non-food rewards, etc). Behavioral treatment: Slim Fast and continue to limit calories/ increase vegetablet and fruits/ limit sugar and beef. Diet interventions: moderate (500 kCal/d) deficit diet and reviewed eating out options.  Recommended fitness pal app again to record food/calories and exercise.. Regular aerobic exercise program discussed. Medication: phentermine. RTC in 6 weeks  Reveiwed with patient that if he take phertermine too late in day it might affect sleep and might be better to skip doses after 9am.  Henrene Pastorammy Minsa Weddington, PharmD, CPP

## 2013-11-08 DIAGNOSIS — M47817 Spondylosis without myelopathy or radiculopathy, lumbosacral region: Secondary | ICD-10-CM | POA: Diagnosis not present

## 2013-12-12 ENCOUNTER — Encounter: Payer: Self-pay | Admitting: Pharmacist

## 2013-12-12 ENCOUNTER — Ambulatory Visit (INDEPENDENT_AMBULATORY_CARE_PROVIDER_SITE_OTHER): Payer: Medicare Other | Admitting: Pharmacist

## 2013-12-12 DIAGNOSIS — E8881 Metabolic syndrome: Secondary | ICD-10-CM

## 2013-12-12 DIAGNOSIS — R635 Abnormal weight gain: Secondary | ICD-10-CM

## 2013-12-12 NOTE — Progress Notes (Signed)
Subjective:     Wallace CullensJames C Dutan is a 33 y.o. male here for reevaluation of weight loss/obesity.  He has been taking phentermine 30mg  1 capsule daily and has lost 18# over the last 10 weeks.  He feels ideal weight is 225 pounds. History of eating disorders: none. There is a family history positive for obesity in the patient, father and brother. Previous treatments for obesity include self-directed dieting. Obesity associated medical conditions: none. Obesity associated medications: none. Cardiovascular risk factors besides obesity: dyslipidemia, male gender and obesity (BMI >= 30 kg/m2).  Has been taking phentermine 30mg  with good results.  Denies increased HR. Report occasional difficulty sleeping which he contributes to stress from taking care of grandfather.    Diet - eggs, Equate or Slim Fast replacement shakes, eating out less over the last 6 weeks.  More vegetables and lean proteins - tuna fish subs and less beef  Drinks water mostly  Exercise - lifts weights (2 - 3 times per week) - this has decreased over last month  The following portions of the patient's history were reviewed and updated as appropriate: allergies, current medications, past family history, past medical history, past social history, past surgical history and problem list.   Objective:    Body mass index is 49.28 kg/(m^2). Filed Vitals:   12/12/13 1500  BP: 126/70  Pulse: 77   Filed Weights   12/12/13 1500  Weight: 324 lb (146.965 kg)    Assessment:    Obesity. I assessed [name] to be in an action stage with respect to weight loss. He has lost 15# since 08/2013 Hypertriglyceridemia - due to recheck but patient not fasting today. Metabolic syndrome with family history of diabetes    Plan:    General weight loss/lifestyle modification strategies discussed (elicit support from others; identify saboteurs; non-food rewards, etc). Behavioral treatment: Slim Fast and continue to limit calories/ increase vegetablet and  fruits/ limit sugar and beef. Diet interventions: moderate (500 kCal/d) deficit diet and reviewed eating out options.  Specifically discussed ways to increase vegetables intake. Regular aerobic exercise program discussed. Medication: Continue phentermine 30mg  once daily RTC in 6 weeks   Henrene Pastorammy Kota Ciancio, PharmD, CPP   Henrene Pastorammy Kolson Chovanec, PharmD, CPP

## 2013-12-15 ENCOUNTER — Ambulatory Visit (INDEPENDENT_AMBULATORY_CARE_PROVIDER_SITE_OTHER): Payer: Medicare Other

## 2013-12-15 ENCOUNTER — Ambulatory Visit (INDEPENDENT_AMBULATORY_CARE_PROVIDER_SITE_OTHER): Payer: Medicare Other | Admitting: Family Medicine

## 2013-12-15 ENCOUNTER — Encounter: Payer: Self-pay | Admitting: Family Medicine

## 2013-12-15 VITALS — BP 137/79 | HR 77 | Temp 97.3°F | Ht 68.0 in | Wt 320.6 lb

## 2013-12-15 DIAGNOSIS — M25579 Pain in unspecified ankle and joints of unspecified foot: Secondary | ICD-10-CM | POA: Insufficient documentation

## 2013-12-15 DIAGNOSIS — M79671 Pain in right foot: Secondary | ICD-10-CM

## 2013-12-15 DIAGNOSIS — M79609 Pain in unspecified limb: Secondary | ICD-10-CM | POA: Diagnosis not present

## 2013-12-15 DIAGNOSIS — M25571 Pain in right ankle and joints of right foot: Secondary | ICD-10-CM

## 2013-12-15 NOTE — Progress Notes (Signed)
Patient ID: Justin Schmidt, male   DOB: 22-Aug-1980, 33 y.o.   MRN: [id number] S: 33 year old male who had sudden onset right lateral foot pain 2 days ago. There is a past history of fracture in the fourth metatarsal. 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 It was treated with some sort of boot. Since then he has had no problem with pain or weakness until onset of this new pain. Of note is the fact that he is a weightlifter and there is increased pressure distress on all his joints and ligaments.  O: Exam confined to the right foot and ankle There is no tenderness over either lateral or medial malleolus. The ankle is stable to stress testing. There is mild pain to palpation over the lateral aspect. Swelling is present but it is bilateral and there is a history of chronic edema. X-ray shows no evidence of acute injury him but I did see the old fracture of the fourth metatarsal on review of previous films  AP: Pain probably related to old injury. I suspect he had some microscopic tears of the talofibular ligaments and he has done something to aggravate that old injury. He tells me that he has worn the old boot and that has helped and I suggested that he might continue that at least in the daytime hours for the next 2 weeks. We also discussed some or her exercises to strengthen and rehab those ligaments  .........Marland Kitchen.Frederica KusterStephen M Krishawn Vanderweele MD

## 2014-01-20 ENCOUNTER — Other Ambulatory Visit: Payer: Self-pay | Admitting: Family Medicine

## 2014-01-20 MED ORDER — PHENTERMINE HCL 30 MG PO CAPS: 30.0000 mg | ORAL_CAPSULE | ORAL | Status: DC

## 2014-01-24 ENCOUNTER — Other Ambulatory Visit: Payer: Self-pay | Admitting: Family Medicine

## 2014-01-25 MED ORDER — PHENTERMINE HCL 30 MG PO CAPS: 30.0000 mg | ORAL_CAPSULE | ORAL | Status: DC

## 2014-01-25 NOTE — Progress Notes (Signed)
RX for Phentermine reprinted for pt pick up Okayed per [employer]

## 2014-01-25 NOTE — Addendum Note (Signed)
Addended by: Bearl MulberryUTHERFORD, NATALIE K on: 01/25/2014 03:12 PM   Modules accepted: Orders

## 2014-01-27 ENCOUNTER — Other Ambulatory Visit: Payer: Self-pay | Admitting: Family Medicine

## 2014-01-30 ENCOUNTER — Encounter: Payer: Self-pay | Admitting: Pharmacist

## 2014-01-30 ENCOUNTER — Ambulatory Visit (INDEPENDENT_AMBULATORY_CARE_PROVIDER_SITE_OTHER): Payer: Medicare Other | Admitting: Pharmacist

## 2014-01-30 DIAGNOSIS — E8881 Metabolic syndrome: Secondary | ICD-10-CM | POA: Diagnosis not present

## 2014-01-30 NOTE — Progress Notes (Signed)
Subjective:     Justin Schmidt is a 33 y.o. male here for reevaluation of weight loss/obesity.  He has been taking phentermine 30mg  1 capsule daily and has lost 20# over the last 16 weeks.  He feels ideal weight is 225 pounds. History of eating disorders: none. There is a family history positive for obesity in the patient, father and brother. Previous treatments for obesity include self-directed dieting. Obesity associated medical conditions: none. Obesity associated medications: none. Cardiovascular risk factors besides obesity: dyslipidemia, male gender and obesity (BMI >= 30 kg/m2).  Has been taking phentermine 30mg  with good results but weight loss has began to slow.  Denies increased HR or problems sleeping    Diet - eggs, Equate or Slim Fast replacement shakes, eating out less over the last 10 weeks.  More vegetables and lean proteins - tuna fish subs and less beef, chicken, brown rice, kale  Drinks water mostly or [place] zero calorie tea  Exercise - lifts weights (2 - 3 times per week).  Has recently added aerobic activity 1-2 times per week.  The following portions of the patient's history were reviewed and updated as appropriate: allergies, current medications, past family history, past medical history, past social history, past surgical history and problem list.   Objective:    Body mass index is 48.97 kg/(m^2). Filed Vitals:   01/30/14 1519  BP: 124/80  Pulse: 80   Filed Weights   01/30/14 1519  Weight: 322 lb (146.058 kg)    Assessment:    Obesity. I assessed Justin Schmidt to be in an action stage with respect to weight loss. He has lost 20# since 08/2013 Metabolic syndrome with family history of diabetes    Plan:    General weight loss/lifestyle modification strategies discussed (elicit support from others; identify saboteurs; non-food rewards, etc). Behavioral treatment: Slim Fast and continue to limit calories/ increase vegetablet and fruits/ limit sugar and beef. Diet  interventions: moderate (500 kCal/d) deficit diet and reviewed eating out options.  Specifically discussed ways to increase vegetables intake. Regular aerobic exercise program discussed. Medication: Increase phentermine 37.5mg  once daily RTC in 6 weeks   Henrene Pastorammy Caine Barfield, PharmD, CPP

## 2014-01-31 ENCOUNTER — Other Ambulatory Visit: Payer: Self-pay | Admitting: Pharmacist

## 2014-01-31 MED ORDER — PHENTERMINE HCL 37.5 MG PO TABS: 37.5000 mg | ORAL_TABLET | Freq: Every day | ORAL | Status: DC

## 2014-03-09 ENCOUNTER — Ambulatory Visit (INDEPENDENT_AMBULATORY_CARE_PROVIDER_SITE_OTHER): Payer: Medicare Other | Admitting: Pharmacist

## 2014-03-09 ENCOUNTER — Encounter: Payer: Self-pay | Admitting: Pharmacist

## 2014-03-09 DIAGNOSIS — F32A Depression, unspecified: Secondary | ICD-10-CM

## 2014-03-09 DIAGNOSIS — F329 Major depressive disorder, single episode, unspecified: Secondary | ICD-10-CM | POA: Diagnosis not present

## 2014-03-09 DIAGNOSIS — J329 Chronic sinusitis, unspecified: Secondary | ICD-10-CM

## 2014-03-09 MED ORDER — FLUTICASONE PROPIONATE 50 MCG/ACT NA SUSP: 2.0000 | Freq: Every day | NASAL | Status: DC

## 2014-03-09 NOTE — Patient Instructions (Signed)
Consider melatonin 2mg  30 minutes prior to sleep if you continue to have problems sleeping.

## 2014-03-09 NOTE — Progress Notes (Signed)
Subjective:     Wallace CullensJames C Dercole is a 33 y.o. male here for reevaluation of weight loss/obesity.  He has been taking phentermine 37.5mg  1 tablet daily and has lost 20# 3 months though his weight in the office today shows and increase of about 8# over the last 6 weeks.  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  PHQ score was 10 today.  Patient is taking St John's Wort for mood.    He feels ideal weight is 225 pounds. History of eating disorders: none. There is a family history positive for obesity in the patient, father and brother. Previous treatments for obesity include self-directed dieting. Obesity associated medical conditions: none. Obesity associated medications: none. Cardiovascular risk factors besides obesity: dyslipidemia, male gender and obesity (BMI >= 30 kg/m2).  Has been taking phentermine 37.5mg  with good results but weight loss has began to slow.  Denies increased HR.  Although he reports trouble falling asleep lately he does not believe it is related to phentermine but due to stress.   Diet - eggs + 2 slices of toast, Equate or Slim Fast replacement shakes.  Has been eating out more lately - [employer] chicken and rice bowl adn [employer]  More vegetables and lean proteins - tuna fish subs and less beef, chicken, brown rice, kale  Drinks water mostly or [place] zero calorie tea but has been having sweet tea when he eats out (usually large with refill)  Exercise - lifts weights (2 - 3 times per week) and Karate 1- 2 days per week Has recently added aerobic activity 1-2 times per week.  The following portions of the patient's history were reviewed and updated as appropriate: allergies, current medications, past family history, past medical history, past social history, past surgical history and problem list.   Objective:    Body mass index is 50.26 kg/(m^2). Filed Vitals:   03/09/14 1518  BP: 130/80  Pulse: 78   Filed  Weights   03/09/14 1518  Weight: 330 lb 8 oz (149.914 kg)    Assessment:    Obesity. I assessed [name] to be in an action stage with respect to weight loss. He has recently has some set backs related weight loss Metabolic syndrome with family history of diabetes Depression / anxiety     Plan:    General weight loss/lifestyle modification strategies discussed (elicit support from others; identify saboteurs; non-food rewards, etc). Behavioral treatment: Slim Fast and continue to limit calories/ increase vegetablet and fruits/ limit sugar and beef. Diet interventions: moderate (500 kCal/d) deficit diet and reviewed eating out options.  Specifically discussed ways to increase vegetables intake. Reviewed healthy options when eating out.  Recommended try mix of sweet and unsweet tea.  Gave handout about sugar containing drinks and their health consiquences. Patient to keep food diary with mood indications to help decrease stress related eating. Regular aerobic exercise program discussed. Discussed taking medication for depression - patient declined but will continue St John's Wort Gave list of psychiatrist and counselors - recommended contact to make appt Medication: Continue current prescription for phentermine 37.5mg  once daily RTC in 6 weeks   Henrene Pastorammy Daejah Klebba, PharmD, CPP

## 2014-03-17 ENCOUNTER — Telehealth: Payer: Self-pay | Admitting: Family Medicine

## 2014-03-17 NOTE — Telephone Encounter (Signed)
Stp, back of leg red,swollen, painful, body aches, irritable, not felling well at all, advised we dont have any opening today, and they should go to the urgent care, pt voiced understanding, will close encounter.

## 2014-03-18 DIAGNOSIS — L0390 Cellulitis, unspecified: Secondary | ICD-10-CM | POA: Diagnosis not present

## 2014-03-22 ENCOUNTER — Ambulatory Visit (INDEPENDENT_AMBULATORY_CARE_PROVIDER_SITE_OTHER): Payer: Medicare Other | Admitting: Family Medicine

## 2014-03-22 ENCOUNTER — Encounter: Payer: Self-pay | Admitting: Family Medicine

## 2014-03-22 VITALS — BP 112/71 | HR 83 | Temp 98.1°F | Ht 68.0 in | Wt 325.0 lb

## 2014-03-22 DIAGNOSIS — L03115 Cellulitis of right lower limb: Secondary | ICD-10-CM | POA: Diagnosis not present

## 2014-03-22 MED ORDER — DOXYCYCLINE HYCLATE 100 MG PO TABS: 100.0000 mg | ORAL_TABLET | Freq: Two times a day (BID) | ORAL | Status: DC

## 2014-03-22 NOTE — Progress Notes (Signed)
Subjective:    Patient ID: Justin Schmidt, male    DOB: 1980/08/27, 33 y.o.   MRN: [id number]  HPI Patient here today for insect bite to right lower leg. This was first noticed on 03/15/14. He went to urgent care on 03/18/14 and was given 2 creams and doxycycline. The patient comes with his father today. According to the patient and his father this inflammation is improving.         Patient Active Problem List   Diagnosis Date Noted  . Pain in joint, ankle and foot 12/15/2013  . Syndrome X, metabolic 10/24/2013  . Severe obesity (BMI >= 40) 09/05/2013  . Hypertriglyceridemia 09/05/2013   Outpatient Encounter Prescriptions as of 03/22/2014  Medication Sig  . acetaminophen-codeine (TYLENOL #3) 300-30 MG per tablet Take 1 tablet by mouth every 4 (four) hours as needed for moderate pain.  . Arginine 500 MG CAPS Take 1 capsule by mouth daily before breakfast.  . doxycycline (ADOXA) 100 MG tablet Take 100 mg by mouth 2 (two) times daily.  . fluticasone (FLONASE) 50 MCG/ACT nasal spray Place 2 sprays into both nostrils daily.  . Ginkgo Biloba (GINKOBA PO) Take by mouth.  Marland Kitchen. ibuprofen (ADVIL,MOTRIN) 200 MG tablet Take 400 mg by mouth every 6 (six) hours as needed. Pain  . Multiple Vitamin (MULTIVITAMIN WITH MINERALS) TABS Take 1 tablet by mouth daily.  . Nutritional Supplements (DHEA PO) Take by mouth.  . nystatin cream (MYCOSTATIN) Apply 1 application topically 2 (two) times daily.  . St Johns Wort 300 MG TABS Take 1 tablet by mouth daily.  Marland Kitchen. triamcinolone cream (KENALOG) 0.1 % Apply 1 application topically 2 (two) times daily.  . furosemide (LASIX) 20 MG tablet Take 20 mg by mouth daily as needed.  . phentermine (ADIPEX-P) 37.5 MG tablet Take 1 tablet (37.5 mg total) by mouth daily before breakfast. (Patient not taking: Reported on 03/22/2014)    Review of Systems  Constitutional: Positive for fatigue.  Eyes: Negative.   Respiratory: Negative.   Cardiovascular: Negative.     Gastrointestinal: Negative.   Endocrine: Negative.   Genitourinary: Negative.   Musculoskeletal: Negative.   Skin: Negative.        Bite to right lower leg- red and heat  Allergic/Immunologic: Negative.   Neurological: Positive for headaches.  Hematological: Negative.   Psychiatric/Behavioral: Negative.        Objective:   Physical Exam  Constitutional: He is oriented to person, place, and time. He appears well-developed and well-nourished.  Musculoskeletal: Normal range of motion. He exhibits no edema.  Neurological: He is alert and oriented to person, place, and time.  Skin: Skin is warm. Rash noted. There is erythema.  The patient showed me a picture of his leg from the initial treatment day and it is greatly improved from that time. There is only slight redness and minimal rubor.  Psychiatric: He has a normal mood and affect. His behavior is normal. Judgment and thought content normal.  Nursing note and vitals reviewed.  BP 112/71 mmHg  Pulse 83  Temp(Src) 98.1 F (36.7 C) (Oral)  Ht 5\' 8"  (1.727 m)  Wt 325 lb (147.419 kg)  BMI 49.43 kg/m2        Assessment & Plan:   1. Cellulitis of leg, right - doxycycline (VIBRA-TABS) 100 MG tablet; Take 1 tablet (100 mg total) by mouth 2 (two) times daily.  Dispense: 20 tablet; Refill: 0  Patient Instructions  Continue to take and complete the antibiotic that you  have and we will call in another prescription for 10 days. Continue the creams that you're using until they're completed Return to clinic in a couple weeks if not doing better.   Nyra Capeson W. Moore MD

## 2014-03-22 NOTE — Patient Instructions (Signed)
Continue to take and complete the antibiotic that you have and we will call in another prescription for 10 days. Continue the creams that you're using until they're completed Return to clinic in a couple weeks if not doing better.

## 2014-03-29 ENCOUNTER — Other Ambulatory Visit: Payer: Self-pay | Admitting: Family Medicine

## 2014-04-10 ENCOUNTER — Ambulatory Visit: Payer: Medicare Other | Admitting: Family Medicine

## 2014-04-12 ENCOUNTER — Encounter: Payer: Self-pay | Admitting: Family Medicine

## 2014-04-12 ENCOUNTER — Ambulatory Visit (INDEPENDENT_AMBULATORY_CARE_PROVIDER_SITE_OTHER): Payer: Medicare Other | Admitting: Family Medicine

## 2014-04-12 VITALS — BP 124/77 | HR 83 | Temp 97.1°F | Ht 68.0 in | Wt 331.0 lb

## 2014-04-12 DIAGNOSIS — F32A Depression, unspecified: Secondary | ICD-10-CM

## 2014-04-12 DIAGNOSIS — R635 Abnormal weight gain: Secondary | ICD-10-CM

## 2014-04-12 DIAGNOSIS — F329 Major depressive disorder, single episode, unspecified: Secondary | ICD-10-CM

## 2014-04-12 DIAGNOSIS — E781 Pure hyperglyceridemia: Secondary | ICD-10-CM | POA: Diagnosis not present

## 2014-04-12 DIAGNOSIS — L03115 Cellulitis of right lower limb: Secondary | ICD-10-CM

## 2014-04-12 DIAGNOSIS — E669 Obesity, unspecified: Secondary | ICD-10-CM

## 2014-04-12 DIAGNOSIS — Z0000 Encounter for general adult medical examination without abnormal findings: Secondary | ICD-10-CM

## 2014-04-12 DIAGNOSIS — E8881 Metabolic syndrome: Secondary | ICD-10-CM

## 2014-04-12 NOTE — Addendum Note (Signed)
Addended by: Magdalene RiverBULLINS, Siedah Sedor H on: 04/12/2014 05:08 PM   Modules accepted: Orders

## 2014-04-12 NOTE — Patient Instructions (Addendum)
Continue with weight loss Continue to drink lots of water Try to reduce your carbonated beverages Return to the clinic for fasting lab work We will call you with the results and consider trying an antidepressant after these results are back Use moisturizers on the skin and avoid scented soaps or fabric softeners and detergents

## 2014-04-12 NOTE — Progress Notes (Signed)
Subjective:    Patient ID: Justin Schmidt, male    DOB: 06/22/1980, 33 y.o.   MRN: [id number]  HPI Patient here today for 2-3 week follow up on right lower leg cellulitis. The patient complains of fatigue and depression. His lost some close relatives and he is taking care of some elderly relatives and just feels down and doesn't want to get out a lot.        Patient Active Problem List   Diagnosis Date Noted  . Pain in joint, ankle and foot 12/15/2013  . Syndrome X, metabolic 10/24/2013  . Severe obesity (BMI >= 40) 09/05/2013  . Hypertriglyceridemia 09/05/2013   Outpatient Encounter Prescriptions as of 04/12/2014  Medication Sig  . acetaminophen-codeine (TYLENOL #3) 300-30 MG per tablet Take 1 tablet by mouth every 4 (four) hours as needed for moderate pain.  . Arginine 500 MG CAPS Take 1 capsule by mouth daily before breakfast.  . fluticasone (FLONASE) 50 MCG/ACT nasal spray Place 2 sprays into both nostrils daily.  . furosemide (LASIX) 20 MG tablet Take 20 mg by mouth daily as needed.  . Ginkgo Biloba (GINKOBA PO) Take by mouth.  Marland Kitchen. ibuprofen (ADVIL,MOTRIN) 200 MG tablet Take 400 mg by mouth every 6 (six) hours as needed. Pain  . Multiple Vitamin (MULTIVITAMIN WITH MINERALS) TABS Take 1 tablet by mouth daily.  . Nutritional Supplements (DHEA PO) Take by mouth.  . nystatin cream (MYCOSTATIN) Apply 1 application topically 2 (two) times daily.  . phentermine (ADIPEX-P) 37.5 MG tablet TAKE 1 TABLET BY MOUTH EVERY MORNING BEFORE BREAKFAST  . St Johns Wort 300 MG TABS Take 1 tablet by mouth daily.  Marland Kitchen. triamcinolone cream (KENALOG) 0.1 % Apply 1 application topically 2 (two) times daily.  . [DISCONTINUED] doxycycline (ADOXA) 100 MG tablet Take 100 mg by mouth 2 (two) times daily.  . [DISCONTINUED] doxycycline (VIBRA-TABS) 100 MG tablet Take 1 tablet (100 mg total) by mouth 2 (two) times daily.    Review of Systems  Constitutional: Negative.   HENT: Negative.   Eyes: Negative.     Respiratory: Negative.   Cardiovascular: Negative.   Gastrointestinal: Negative.   Endocrine: Negative.   Genitourinary: Negative.   Musculoskeletal: Negative.   Skin: Negative.        Right lower leg pink  - some better  Allergic/Immunologic: Negative.   Neurological: Negative.   Hematological: Negative.   Psychiatric/Behavioral: Negative.        Objective:   Physical Exam  Constitutional: He is oriented to person, place, and time. He appears well-developed and well-nourished. No distress.  HENT:  Head: Normocephalic and atraumatic.  Right Ear: External ear normal.  Left Ear: External ear normal.  Nose: Nose normal.  Mouth/Throat: Oropharynx is clear and moist. No oropharyngeal exudate.  Eyes: Conjunctivae and EOM are normal. Pupils are equal, round, and reactive to light. Right eye exhibits no discharge. Left eye exhibits no discharge.  Neck: Normal range of motion. Neck supple. No thyromegaly present.  Cardiovascular: Normal rate, regular rhythm and normal heart sounds.   No murmur heard. At 72/m  Pulmonary/Chest: No respiratory distress. He has no rales.  Abdominal: Soft. Bowel sounds are normal. He exhibits no distension. There is no tenderness. There is no rebound and no guarding.  Musculoskeletal: Normal range of motion. He exhibits no edema.  Neurological: He is alert and oriented to person, place, and time.  Skin: Skin is warm and dry. No rash noted. No erythema.  The area of erythema and  cellulitis appears to be resolved.  Psychiatric: He has a normal mood and affect. His behavior is normal. Judgment and thought content normal.  Mood was somewhat flat but not necessarily depressed in nature  Nursing note and vitals reviewed.  BP 124/77 mmHg  Pulse 83  Temp(Src) 97.1 F (36.2 C) (Oral)  Ht 5\' 8"  (1.727 m)  Wt 331 lb (150.141 kg)  BMI 50.34 kg/m2        Assessment & Plan:  1. Cellulitis of leg, right  2. Obesity  3. Depression Patient Instructions   Continue with weight loss Continue to drink lots of water Try to reduce your carbonated beverages Return to the clinic for fasting lab work We will call you with the results and consider trying an antidepressant after these results are back Use moisturizers on the skin and avoid scented soaps or fabric softeners and detergents   Nyra Capeson W. Crisanto Nied MD

## 2014-05-04 ENCOUNTER — Ambulatory Visit (INDEPENDENT_AMBULATORY_CARE_PROVIDER_SITE_OTHER): Payer: Medicare Other | Admitting: Pharmacist

## 2014-05-04 ENCOUNTER — Encounter: Payer: Self-pay | Admitting: Pharmacist

## 2014-05-04 DIAGNOSIS — R5382 Chronic fatigue, unspecified: Secondary | ICD-10-CM

## 2014-05-04 DIAGNOSIS — E782 Mixed hyperlipidemia: Secondary | ICD-10-CM

## 2014-05-04 DIAGNOSIS — R635 Abnormal weight gain: Secondary | ICD-10-CM | POA: Diagnosis not present

## 2014-05-04 DIAGNOSIS — E8881 Metabolic syndrome: Secondary | ICD-10-CM | POA: Diagnosis not present

## 2014-05-04 NOTE — Progress Notes (Signed)
Subjective:     Justin Schmidt is a 34 y.o. male here for reevaluation of weight loss/obesity.  He has been taking phentermine 37.84m 1 tablet daily and has lost 20# over the last 3 months though his weight in the office today shows an increase of about 1# over the last 4 weeks.  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  His grandfather is in his 960'sand recently fell at home. 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  PHQ score was 10 today.  Patient is taking [place]for mood.  He has discussed prescription antidepressants with Dr MLaurance Flattenin past but declined at last visit.    He feels ideal weight is 225 pounds. History of eating disorders: none. There is a family history positive for obesity in the patient, father and brother. Previous treatments for obesity include self-directed dieting. Obesity associated medical conditions: none. Obesity associated medications: none. Cardiovascular risk factors besides obesity: dyslipidemia, male gender and obesity (BMI >= 30 kg/m2).  Has been taking phentermine 37.585mwith good results but weight loss has began to slow.  Denies increased HR.  Although he reports trouble falling asleep lately he does not believe it is related to phentermine but due to stress.   Diet - eggs + 2 slices of toast, Equate or Slim Fast replacement shakes.  Has been eating out less More vegetables and lean proteins - tuna fish subs and less beef, chicken, brown rice, kale Drinks water mostly or [place] calorie tea  Exercise - lifts weights (2 - 3 times per week) and Karate 1- 2 days per week Has recently added aerobic activity 1-2 times per week.  The following portions of the patient's history were reviewed and updated as appropriate: allergies, current medications, past family history, past medical history, past social history, past surgical history and problem  list.   Objective:    Body mass index is 50.49 kg/(m^2). Filed Vitals:   05/04/14 1534  BP: 122/82  Pulse: 78   Filed Weights   05/04/14 1534  Weight: 332 lb (150.594 kg)    Assessment:    Obesity. I assessed Justin Schmidt be in an action stage with respect to weight loss. He has recently has some set backs related weight loss Metabolic syndrome with family history of diabetes Depression / anxiety  Insomnia     Plan:      1.General weight loss/lifestyle modification strategies discussed (elicit support from others; identify saboteurs; non-food rewards, etc). 2. Behavioral treatment: Slim Fast and continue to limit calories/ increase vegetablet and fruits/ limit sugar and beef. 3. Diet interventions: moderate (500 kCal/d) deficit diet and reviewed eating out options.  Specifically discussed ways to increase vegetables intake. Reviewed healthy options when eating out.   4. Regular aerobic exercise program discussed. 5. Discussed taking medication for depression - patient declined but will continue StHayti.  Discuss possiblity of getting help with grandfather's care to help with stress at home (suggested in home caregiver, may [facility]) 7.  Consider melatonin for sleep.  8.  Patient was suppose to get labs at the end of December but did not come in and labs expires.  Labs ordered for tomorrow- patient to RTC tomorrow.  Orders Placed This Encounter  Procedures  . CMP14+EGFR    Standing Status: Future  Number of Occurrences:      Standing Expiration Date: 05/26/2014  . Testosterone,Free and Total    Standing Status: Future     Number of Occurrences:      Standing Expiration Date: 05/26/2014  . Thyroid Panel With TSH    Standing Status: Future     Number of Occurrences:      Standing Expiration Date: 05/26/2014  . Lipid panel    Standing Status: Future     Number of Occurrences:      Standing Expiration Date: 05/26/2014  . Prolactin     Standing Status: Future     Number of Occurrences:      Standing Expiration Date: 05/26/2014  . FSH/LH    Standing Status: Future     Number of Occurrences:      Standing Expiration Date: 05/26/2014  . POCT CBC    Standing Status: Future     Number of Occurrences:      Standing Expiration Date: 05/26/2014    RTC in 6 weeks   Cherre Robins, PharmD, CPP

## 2014-05-05 ENCOUNTER — Other Ambulatory Visit (INDEPENDENT_AMBULATORY_CARE_PROVIDER_SITE_OTHER): Payer: Medicare Other

## 2014-05-05 DIAGNOSIS — R5383 Other fatigue: Secondary | ICD-10-CM | POA: Diagnosis not present

## 2014-05-05 DIAGNOSIS — E8881 Metabolic syndrome: Secondary | ICD-10-CM

## 2014-05-05 DIAGNOSIS — E782 Mixed hyperlipidemia: Secondary | ICD-10-CM

## 2014-05-05 DIAGNOSIS — R5382 Chronic fatigue, unspecified: Secondary | ICD-10-CM | POA: Diagnosis not present

## 2014-05-05 DIAGNOSIS — R635 Abnormal weight gain: Secondary | ICD-10-CM

## 2014-05-05 LAB — POCT CBC
Granulocyte percent: 67.3 %G (ref 37–80)
HEMATOCRIT: 45.2 % (ref 43.5–53.7)
HEMOGLOBIN: 14.3 g/dL (ref 14.1–18.1)
LYMPH, POC: 1.9 (ref 0.6–3.4)
MCH: 29.1 pg (ref 27–31.2)
MCHC: 31.7 g/dL — AB (ref 31.8–35.4)
MCV: 91.7 fL (ref 80–97)
MPV: 8.6 fL (ref 0–99.8)
POC Granulocyte: 4.6 (ref 2–6.9)
POC LYMPH %: 27.5 % (ref 10–50)
Platelet Count, POC: 237 10*3/uL (ref 142–424)
RBC: 4.9 M/uL (ref 4.69–6.13)
RDW, POC: 13.4 %
WBC: 6.9 10*3/uL (ref 4.6–10.2)

## 2014-05-05 NOTE — Progress Notes (Signed)
Lab work for [facility]

## 2014-05-07 LAB — LIPID PANEL
CHOLESTEROL TOTAL: 179 mg/dL (ref 100–199)
Chol/HDL Ratio: 3.7 ratio units (ref 0.0–5.0)
HDL: 49 mg/dL (ref >39)
LDL Calculated: 116 mg/dL — ABNORMAL HIGH (ref 0–99)
Triglycerides: 71 mg/dL (ref 0–149)
VLDL Cholesterol Cal: 14 mg/dL (ref 5–40)

## 2014-05-07 LAB — THYROID PANEL WITH TSH
FREE THYROXINE INDEX: 2.4 (ref 1.2–4.9)
T3 Uptake Ratio: 30 % (ref 24–39)
T4 TOTAL: 7.9 ug/dL (ref 4.5–12.0)
TSH: 2.71 u[IU]/mL (ref 0.450–4.500)

## 2014-05-07 LAB — CMP14+EGFR
ALT: 52 IU/L — ABNORMAL HIGH (ref 0–44)
AST: 27 IU/L (ref 0–40)
Albumin/Globulin Ratio: 1.7 (ref 1.1–2.5)
Albumin: 4.5 g/dL (ref 3.5–5.5)
Alkaline Phosphatase: 64 IU/L (ref 39–117)
BUN/Creatinine Ratio: 13 (ref 8–19)
BUN: 18 mg/dL (ref 6–20)
CALCIUM: 9.5 mg/dL (ref 8.7–10.2)
CO2: 28 mmol/L (ref 18–29)
CREATININE: 1.36 mg/dL — AB (ref 0.76–1.27)
Chloride: 101 mmol/L (ref 97–108)
GFR calc Af Amer: 78 mL/min/{1.73_m2} (ref >59)
GFR calc non Af Amer: 67 mL/min/{1.73_m2} (ref >59)
Globulin, Total: 2.7 g/dL (ref 1.5–4.5)
Glucose: 99 mg/dL (ref 65–99)
Potassium: 4.3 mmol/L (ref 3.5–5.2)
Sodium: 143 mmol/L (ref 134–144)
Total Bilirubin: 0.3 mg/dL (ref 0.0–1.2)
Total Protein: 7.2 g/dL (ref 6.0–8.5)

## 2014-05-07 LAB — FSH/LH
FSH: 2.2 m[IU]/mL (ref 1.5–12.4)
LH: 4.5 m[IU]/mL (ref 1.7–8.6)

## 2014-05-07 LAB — TESTOSTERONE,FREE AND TOTAL
TESTOSTERONE FREE: 9.2 pg/mL (ref 8.7–25.1)
Testosterone: 340 ng/dL — ABNORMAL LOW (ref 348–1197)

## 2014-05-07 LAB — PROLACTIN: Prolactin: 11.4 ng/mL (ref 4.0–15.2)

## 2014-05-10 ENCOUNTER — Other Ambulatory Visit: Payer: Self-pay | Admitting: Pharmacist

## 2014-05-10 ENCOUNTER — Telehealth: Payer: Self-pay | Admitting: Pharmacist

## 2014-05-10 DIAGNOSIS — R7989 Other specified abnormal findings of blood chemistry: Secondary | ICD-10-CM

## 2014-05-10 NOTE — Telephone Encounter (Signed)
CBC - WNL. Triglycerides and HDL have improved with dietary changes and exercise - continue same. LDL OK Serum creatinine has been elevated at last 2 checks - discussed with Dr Christell ConstantMoore - recommended no anti inflammatory medications and recheck in 2-4 week.  Total testosterone low - free testosterone - WNL. FSH and LH were WNL - recommend recheck testosterone in 2-4 weeks also. If still low will consider testosterone replacement. Thyroid panel - WNL  Patient aware and appt made to have labs rechecked 05/24/2014

## 2014-05-12 ENCOUNTER — Other Ambulatory Visit: Payer: Self-pay | Admitting: Pharmacist

## 2014-05-12 MED ORDER — GREEN TEA PO TABS: 315.0000 mg | ORAL_TABLET | Freq: Every day | ORAL | Status: DC

## 2014-05-12 MED ORDER — COENZYME Q10 100 MG PO TABS: 100.0000 mg | ORAL_TABLET | Freq: Every day | ORAL | Status: DC

## 2014-05-13 DIAGNOSIS — M7989 Other specified soft tissue disorders: Secondary | ICD-10-CM | POA: Diagnosis not present

## 2014-05-13 DIAGNOSIS — M79672 Pain in left foot: Secondary | ICD-10-CM | POA: Diagnosis not present

## 2014-05-16 ENCOUNTER — Telehealth: Payer: Self-pay | Admitting: Family Medicine

## 2014-05-17 NOTE — Telephone Encounter (Signed)
CBC - WNL. Triglycerides and HDL have improved with dietary changes and exercise - continue same. LDL OK Serum creatinine has been elevated at last 2 checks - discussed with Dr Christell ConstantMoore - recommended no anti inflammatory medications and recheck in 2-4 week.  Total testosterone low - free testosterone - WNL. FSH and LH were WNL - recommend recheck testosterone in 2-4 weeks also. If still low will consider testosterone replacement. Thyroid panel - WNL  Patient's father notified of labs results.

## 2014-05-24 ENCOUNTER — Other Ambulatory Visit (INDEPENDENT_AMBULATORY_CARE_PROVIDER_SITE_OTHER): Payer: Medicare Other

## 2014-05-24 DIAGNOSIS — R7989 Other specified abnormal findings of blood chemistry: Secondary | ICD-10-CM

## 2014-05-24 DIAGNOSIS — E291 Testicular hypofunction: Secondary | ICD-10-CM | POA: Diagnosis not present

## 2014-05-24 DIAGNOSIS — R748 Abnormal levels of other serum enzymes: Secondary | ICD-10-CM

## 2014-05-26 LAB — BMP8+EGFR
BUN/Creatinine Ratio: 15 (ref 8–19)
BUN: 16 mg/dL (ref 6–20)
CO2: 24 mmol/L (ref 18–29)
Calcium: 9.6 mg/dL (ref 8.7–10.2)
Chloride: 101 mmol/L (ref 97–108)
Creatinine, Ser: 1.1 mg/dL (ref 0.76–1.27)
GFR calc Af Amer: 101 mL/min/{1.73_m2} (ref >59)
GFR calc non Af Amer: 87 mL/min/{1.73_m2} (ref >59)
GLUCOSE: 116 mg/dL — AB (ref 65–99)
Potassium: 4.8 mmol/L (ref 3.5–5.2)
Sodium: 141 mmol/L (ref 134–144)

## 2014-05-26 LAB — TESTOSTERONE,FREE AND TOTAL
Testosterone, Free: 8.9 pg/mL (ref 8.7–25.1)
Testosterone: 383 ng/dL (ref 348–1197)

## 2014-06-19 ENCOUNTER — Telehealth: Payer: Self-pay | Admitting: Pharmacist

## 2014-06-19 MED ORDER — PHENTERMINE HCL 37.5 MG PO TABS: ORAL_TABLET | ORAL | Status: DC

## 2014-06-19 NOTE — Telephone Encounter (Signed)
rx called in and appt made. Patient aware

## 2014-07-03 ENCOUNTER — Ambulatory Visit (INDEPENDENT_AMBULATORY_CARE_PROVIDER_SITE_OTHER): Payer: Medicare Other | Admitting: Pharmacist

## 2014-07-03 ENCOUNTER — Encounter: Payer: Self-pay | Admitting: Pharmacist

## 2014-07-03 DIAGNOSIS — E8881 Metabolic syndrome: Secondary | ICD-10-CM

## 2014-07-03 NOTE — Progress Notes (Signed)
Subjective:     Justin Schmidt is a 34 y.o. male here for reevaluation of weight loss/obesity.  He has been taking phentermine 37.5mg  1 tablet daily and had lost 20#  But his weight is starting to increase.  He had some problems with his foot in January and was exercising less but has restarted and is focusing on trying to increase cardio exercise.    He states that the stress at home has improved.  Patient is taking St John's Wort for mood.  He has discussed prescription antidepressants with Dr Christell ConstantMoore in past but declined at last visit.    He feels ideal weight is 225 pounds. History of eating disorders: none. There is a family history positive for obesity in the patient, father and brother. Previous treatments for obesity include self-directed dieting. Obesity associated medical conditions: none. Obesity associated medications: none. Cardiovascular risk factors besides obesity: dyslipidemia, male gender and obesity (BMI >= 30 kg/m2).  Has been taking phentermine 37.5mg  with good results but weight loss has began to slow.  Denies increased HR.     Diet - eggs + 2 slices of toast, Equate or Slim Fast replacement shakes.  Has been eating out less More vegetables and lean proteins - tuna fish subs and less beef, chicken, brown rice, kale Drinks water mostly or [place] zero calorie tea  Exercise - lifts weights (2 - 3 times per week) and Karate 1- 2 days per week Has recently added aerobic activity 1-2 times per week.  The following portions of the patient's history were reviewed and updated as appropriate: allergies, current medications, past family history, past medical history, past social history, past surgical history and problem list.   Objective:    Body mass index is 51.86 kg/(m^2). Filed Vitals:   07/03/14 1615  BP: 128/82  Pulse: 82   Filed Weights   07/03/14 1615  Weight: 341 lb (154.677 kg)    Assessment:    Obesity. I assessed Justin Schmidt to be in an action stage with respect to  weight loss. He has recently has some set backs related weight loss Metabolic syndrome with family history of diabetes       Plan:      1.General weight loss/lifestyle modification strategies discussed (elicit support from others; identify saboteurs; non-food rewards, etc). 2. Behavioral treatment: Slim Fast and continue to limit calories/ increase vegetablet and fruits/ limit sugar and beef. 3. Diet interventions: moderate (500 kCal/d) deficit diet and reviewed eating out options.  Will forward 7 day diet to patient based on his food preferences that is 1500 to 2000 kcal per day.  4. Regular aerobic exercise program discussed. 5  Discussed trying Contrave in place of phentermine.  Would need to stop St John's Wort prior to starting Contrave which patient is agreeable to.  He would like to read over information provided today about contrava. 6.  Reviewed patient's last lipid panel with him and discussed ASCVD risk (which currently is 0.8%)  RTC in 6 weeks   Henrene Pastorammy Juley Giovanetti, PharmD, CPP

## 2014-07-03 NOTE — Patient Instructions (Signed)
Stop st john's wort and ginko.

## 2014-07-20 DIAGNOSIS — L03011 Cellulitis of right finger: Secondary | ICD-10-CM | POA: Diagnosis not present

## 2014-07-28 ENCOUNTER — Other Ambulatory Visit: Payer: Self-pay | Admitting: Family Medicine

## 2014-07-28 NOTE — Telephone Encounter (Signed)
Last seen 04/12/14 [facility]  If approved print and route to nurse

## 2014-07-28 NOTE — Telephone Encounter (Signed)
This prescription is okay 1 

## 2014-07-31 ENCOUNTER — Other Ambulatory Visit: Payer: Self-pay | Admitting: Family Medicine

## 2014-07-31 NOTE — Telephone Encounter (Signed)
rx here LM for patient

## 2014-08-17 ENCOUNTER — Ambulatory Visit (INDEPENDENT_AMBULATORY_CARE_PROVIDER_SITE_OTHER): Payer: Medicare Other | Admitting: Pharmacist

## 2014-08-17 ENCOUNTER — Encounter: Payer: Self-pay | Admitting: Pharmacist

## 2014-08-17 DIAGNOSIS — E8881 Metabolic syndrome: Secondary | ICD-10-CM

## 2014-08-17 NOTE — Progress Notes (Signed)
Subjective:     Justin Schmidt is a 34 y.o. male here for reevaluation of weight loss/obesity.  He has been taking phentermine 37.5mg  1 tablet daily and had lost 12# since his last week 6 weeks ago. He had some problems with his foot in January and was exercising less but has restarted and is focusing on trying to increase cardio exercise. 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      He feels ideal weight is 225 pounds. History of eating disorders: none. There is a family history positive for obesity in the patient, father and brother. Previous treatments for obesity include self-directed dieting. Obesity associated medical conditions: none. Obesity associated medications: none. Cardiovascular risk factors besides obesity: dyslipidemia, male gender and obesity (BMI >= 30 kg/m2).  Has been taking phentermine 37.5mg  and hit a plateau but has lost more weight with healthy eating and exercise.  Denies increased HR or insomnia.   Diet - eggs + 2 slices of toast, Equate or Slim Fast replacement shakes before gym.  Has been eating out less More vegetables and lean proteins - tuna fish subs, grilled chicken and less beef, more fruits and vegetables like celery, apples, and bananas. Patient also avoids greasy foods Drinks water mostly or [place] zero calorie tea  Exercise - lifts weights (2 - 3 times per week). Sometimes does treadmill at the gym. Has recently added aerobic activity 1-2 times per week.  The following portions of the patient's history were reviewed and updated as appropriate: allergies, current medications, past family history, past medical history, past social history, past surgical history and problem list.   Objective:    Body mass index is 50.04 kg/(m^2).   Filed Vitals:   08/17/14 1456  BP: 145/87  Pulse: 84    Filed Weights   08/17/14 1456  Weight: 329 lb (149.233 kg)    Assessment:    Obesity. I assessed Justin Schmidt to be  in an action stage with respect to weight loss.  Metabolic syndrome with family history of diabetes       Plan:      1.General weight loss/lifestyle modification strategies discussed (elicit support from others; identify saboteurs; non-food rewards, etc). Encouraged patient with keeping up with the healthy diet and regular exercise. Discussed choosing low sodium options when available and limiting red meats and dairy products to help lower cholesterol. Agreed with patient to mix up different types of exercises to avoid a plateau in losing weight and health benefits.   2. Behavioral treatment: Slim Fast and continue to limit calories/ increase vegetablet and fruits/ limit sugar and beef.  3. Diet interventions: moderate (500 kCal/d) deficit diet and reviewed eating out options.   4. Regular aerobic exercise program discussed.  5. Discussed possible other medications if patient hits a plateau again with phentermine such as Wellbutrin, Victoza, and Contrave.  RTC in 6 weeks   Henrene Pastorammy Rilynne Lonsway, PharmD, CPP

## 2014-09-29 ENCOUNTER — Encounter: Payer: Self-pay | Admitting: Pharmacist

## 2014-09-29 ENCOUNTER — Ambulatory Visit (INDEPENDENT_AMBULATORY_CARE_PROVIDER_SITE_OTHER): Payer: Medicare Other | Admitting: Pharmacist

## 2014-09-29 VITALS — BP 130/78 | HR 80 | Ht 68.0 in | Wt 330.5 lb

## 2014-09-29 DIAGNOSIS — E8881 Metabolic syndrome: Secondary | ICD-10-CM | POA: Diagnosis not present

## 2014-09-29 DIAGNOSIS — R7309 Other abnormal glucose: Secondary | ICD-10-CM

## 2014-09-29 DIAGNOSIS — Z0000 Encounter for general adult medical examination without abnormal findings: Secondary | ICD-10-CM | POA: Diagnosis not present

## 2014-09-29 LAB — POCT GLYCOSYLATED HEMOGLOBIN (HGB A1C): Hemoglobin A1C: 5.8

## 2014-09-29 NOTE — Progress Notes (Signed)
Patient ID: Justin Schmidt, male   DOB: 08-06-1980, 34 y.o.   MRN: [id number]    Subjective:   Justin Schmidt is a 34 y.o. male who presents for an Initial Medicare Annual Wellness Visit and for reevaluation of weight loss/obesity. He has lost about 18# since 06/2013.  He has recently been taking phentermine 37.5mg  1 tablet daily and continues to diet and exercise regularly.  He has not lost any weight over the last 6 weeks.  He admits that he has slipped a little with diet and has been eating more CHO's.   Diet - 24 hour recall:   Breakfast / lunch - 2 tomato sandwiches and apple pie with diet [place] green tea.  Supper - grilled chicken salad.  Apple and orange for snacks.   Exercise - MWF - cardio or does exercise with large tractor tire (flipping and hitting with sledgehammer).  Weight lifting tu/th/sat.  Karate 1 or 2 times per week   He feels ideal weight is 225 pounds. History of eating disorders: none. There is a family history positive for obesity in the patient, father and brother. His father and mother also have type 2 DM.   Previous treatments for obesity include self-directed dieting. Obesity associated medical conditions: none. Obesity associated medications: none. Cardiovascular risk factors besides obesity: dyslipidemia, male gender and obesity (BMI >= 30 kg/m2).  Current Medications (verified) Outpatient Encounter Prescriptions as of 09/29/2014  Medication Sig  . acetaminophen-codeine (TYLENOL #3) 300-30 MG per tablet Take 1 tablet by mouth every 4 (four) hours as needed for moderate pain.  . Arginine 500 MG CAPS Take 1 capsule by mouth daily.  . Cholecalciferol (VITAMIN D) 2000 UNITS tablet Take 2,000 Units by mouth daily.  . Coenzyme Q10 100 MG TABS Take 1 tablet (100 mg total) by mouth daily.  . Flaxseed, Linseed, (FLAX SEED OIL PO) Take 1,200 mg by mouth daily.  . furosemide (LASIX) 20 MG tablet Take 20 mg by mouth daily as needed.  . Misc Natural Products (GREEN TEA) TABS Take  315 mg by mouth daily.  . Multiple Vitamin (MULTIVITAMIN WITH MINERALS) TABS Take 1 tablet by mouth daily.  . Nutritional Supplements (DHEA PO) Take 50 mg by mouth daily. On days after workout  . phentermine (ADIPEX-P) 37.5 MG tablet TAKE 1 TABLET BY MOUTH EVERY MORNING BEFORE BREAKFAST  . TURMERIC CURCUMIN PO Take 1 capsule by mouth daily.  . fluticasone (FLONASE) 50 MCG/ACT nasal spray Place 2 sprays into both nostrils daily. (Patient not taking: Reported on 08/17/2014)   Facility-Administered Encounter Medications as of 09/29/2014  Medication  . sodium chloride irrigation 0.9 %    Allergies (verified) Review of patient's allergies indicates no known allergies.   History: Past Medical History  Diagnosis Date  . Gallstones   . Back pain    Past Surgical History  Procedure Laterality Date  . Cholecystectomy  01/23/2012    Procedure: LAPAROSCOPIC CHOLECYSTECTOMY;  Surgeon: Justin Bering, MD;  Location: [facility];  Service: General;  Laterality: N/A;  . Appendectomy  1991   Family History  Problem Relation Age of Onset  . Diabetes Mother   . Diabetes Other   . Diabetes Father   . Hypertension Father   . Hyperlipidemia Father   . Migraines Sister   . Diabetes Maternal Uncle   . Diabetes Maternal Grandmother    Social History   Occupational History  . Not on file.   Social History Main Topics  . Smoking status: Never  Smoker   . Smokeless tobacco: Never Used  . Alcohol Use: Yes     Comment: occasional  . Drug Use: No  . Sexual Activity: No    Do you feel safe at home?  Yes Cardiac Risk Factors include: dyslipidemia;family history of premature cardiovascular disease;male gender;obesity (BMI >30kg/m2)  Objective:    Today's Vitals   09/29/14 1607  BP: 130/78  Pulse: 80  Height: 5\' 8"  (1.727 m)  Weight: 330 lb 8 oz (149.914 kg)  PainSc: 2   PainLoc: Neck   Body mass index is 50.26 kg/(m^2).   Activities of Daily Living In your present state of health, do you  have any difficulty performing the following activities: 09/29/2014  Hearing? N  Vision? N  Difficulty concentrating or making decisions? N  Walking or climbing stairs? N  Dressing or bathing? N  Doing errands, shopping? N  Preparing Food and eating ? N  Using the Toilet? N  In the past six months, have you accidently leaked urine? N  Do you have problems with loss of bowel control? N  Managing your Medications? N  Managing your Finances? N  Housekeeping or managing your Housekeeping? N   Are there smokers in your home (other than you)? No  Depression Screen PHQ 2/9 Scores 09/29/2014 03/09/2014 07/18/2013 06/27/2013  PHQ - 2 Score 0 3 0 0  PHQ- 9 Score - 10 - -    Fall Risk Fall Risk  09/29/2014 03/09/2014 07/18/2013 06/27/2013  Falls in the past year? No No No No    Cognitive Function: MMSE - Mini Mental State Exam 09/29/2014  Orientation to time 5  Orientation to Place 5  Registration 3  Attention/ Calculation 5  Recall 3  Language- name 2 objects 2  Language- repeat 1  Language- follow 3 step command 3  Language- read & follow direction 1  Write a sentence 1  Copy design 1  Total score 30    Immunizations and Health Maintenance Immunization History  Administered Date(s) Administered  . Td 04/08/2005   Health Maintenance Due  Topic Date Due  . HIV Screening  02/12/1995    Patient Care Team: Frederica KusterStephen M Miller, MD as PCP - General (Family Medicine)  Indicate any recent Medical Services you may have received from other than [facility] providers in the past year (date may be approximate).    Assessment:    Annual Wellness Visit  Obesity Pre-Diabetes Hyperlipidemia - improved with diet changes HTN - at goal today  Screening Tests Health Maintenance  Topic Date Due  . HIV Screening  02/12/1995  . INFLUENZA VACCINE  11/27/2014  . TETANUS/TDAP  04/09/2015      Plan:   During the course of the visit Justin FearingJames was educated and counseled about the following appropriate  screening and preventive services:   Vaccines to include influenza and Tdap - neither due now  Patient declined HIV testing  Colorectal cancer screening - not due currently  BP was slightly elevated at last visit but was at goal today  Diabetes screening - last FBG was elevated - checked A1c today and was in pre diabetic range  Patient educated about pre-diabetes and risk of developing type 2 DM.  Discussed limiting CHO in diet to help keep BG down.  Also discussed starting metformin.  Will address again at next visit  RTC in 6 weeks to recheck BP/HR/weight  Patient Instructions (the written plan) were given to the patient.   Henrene PastorEckard, Donny Heffern, PHARMD, CPP, CDE  09/30/2014         

## 2014-09-29 NOTE — Patient Instructions (Signed)
Prediabetes Many people have heard about type 2 diabetes, but its common precursor, prediabetes, doesn't get as much attention. Prediabetes is estimated by CDC to affect 86 million Americans (this includes 51% of people 65 years and older), and an estimated 90% of people with prediabetes don't even know it. According to the CDC, 15-30% of these individuals will develop type 2 diabetes within five years. In other words, as many as 26 million people that currently have prediabetes could develop type 2 diabetes by 2020, effectively doubling the number of people with type 2 diabetes in the [place].  What is prediabetes? Prediabetes is a condition where blood sugar levels are higher than normal, but not high enough to be diagnosed as type 2 diabetes. This occurs when the body has problems in processing glucose properly, and sugar starts to build up in the bloodstream instead of fueling cells in muscles and tissues. Insulin is the hormone that tells cells to take up glucose, and in prediabetes, people typically initially develop insulin resistance (where the body's cells can't respond to insulin as well), and over time (if no actions are taken to reverse the situation) the ability to produce sufficient insulin is reduced. People with prediabetes also commonly have high blood pressure as well as abnormal blood lipids (e.g. cholesterol). These often occur prior to the rise of blood glucose levels.  What are the symptoms of prediabetes? People typically do not have symptoms of prediabetes, which is partially why up to 90% of people don't know they have it. The ADA reports that some people with prediabetes may develop symptoms of type 2 diabetes, though even many people diagnosed with type 2 diabetes show little or no symptoms initially at diagnosis.  How is prediabetes diagnosed? According to the American Diabetes Association, prediabetes can be diagnosed through one of the following tests: 1. A glycated hemoglobin  test, also known as HbA1c or simply A1c, gives an idea of the body's average blood sugar levels from the past two or three months. It is usually done with a small drop of blood from a fingerstick or as part of having blood taken in a doctor's office, hospital, or laboratory. A1c Level Diagnosis  Less than 5.7% Normal  5.7% to 6.4% Prediabetes  6.5% and higher Diabetes  2. A fasting plasma glucose (FPG) test measures a person's blood glucose level after fasting (not eating) for eight hours - this is typically done in the morning. If a test shows positive for prediabetes, a second test should be taken on a different day to confirm the diagnosis. FPG Level Diagnosis  Less than 100 mg/dl Normal  100 mg/dl to 125 mg/dl Prediabetes  126 mg/dl and higher Diabetes   Who is at risk of developing prediabetes? A well-known paper published in the Lancet in 2010 recommends screening for type 2 diabetes (which would also screen for prediabetes) every 3-5 years in all adults over the age of 45, regardless of other risk factors. Overweight and obese adults (a BMI >25 kg/m2) are also at significantly greater risk for developing prediabetes, as well as people with a family history of type 2 diabetes. According to the CDC, several other factors can have moderate influences on prediabetes risk in addition to age, weight, and family history: People with an African American, Hispanic/Latino, American Indian, Asian American, or Pacific Islander racial or ethnic background. The 2015 ADA Standards of Medical Care recommendations suggest Asian Americans with a BMI of 23 or above be screened for type 2 diabetes.    Women with a history of diabetes during pregnancy ("gestational diabetes") or have given birth to a baby weighing nine pounds or more. People who are physically active fewer than three times a week. The CDC offers a fast, online screening test for evaluating the risk for prediabetes. The ADA also offers a screening  test to assess type 2 diabetes risk. Of course, these tests do not themselves confirm a prediabetes diagnosis, but just if someone may be at higher risk of developing it.  Why do people develop prediabetes? Prediabetes develops through a combination of factors that are still being investigated. For sure, lifestyle factors (food, exercise, stress, sleep) play a role, but family history and genetics certainly do as well. It is easy to assume that prediabetes is the result of being overweight, but the relationship is not that simple. While obesity is one underlying cause of insulin resistance, many overweight individuals may never develop prediabetes or type 2 diabetes, and a minority of people with prediabetes have never been overweight. To make matters worse, it can be increasingly difficult to make healthy choices in today's toxic food environment that steers all of us to make the wrong food choices, and there are many factors that can contribute to weight gain in addition to diet.  Is a prediabetes diagnosis serious? There has been significant debate around the term 'prediabetes,' and whether it should be considered cause for alarm. On the one hand, it serves as a risk factor for type 2 diabetes and a host of other complications, including heart disease, and ultimately prediabetes implies that a degree of metabolic problems have started to occur in the body. On the other hand, it places a diagnosis on many people who may never develop type 2 diabetes. Again, according to the CDC, 15-30% of those with prediabetes will develop type 2 diabetes within five years. However, a 2012 Lancet article cites 5-10% of those with prediabetes each year will also revert back to healthy blood sugars. What's critical is not necessarily the cutoff itself, but where someone falls within the ranges listed above. The level of risk of developing type 2 diabetes is closely related to A1c or FPG at diagnosis. Those in the higher ranges  (A1c closer to 6.4%, FPG closer to 125 mg/dl) are much more likely to progress to type 2 diabetes, whereas those at lower ranges (A1c closer to 5.7%, FPG closer to 100 mg/dl) are relatively more likely to revert back to normal glucose levels or stay within the prediabetes range. Age of diagnosis and the level of insulin production still occurring at diagnosis also impact the chances of reverting to normoglycemia (normal blood sugar levels).  What can people with prediabetes do to avoid the progression from prediabetes to type 2 diabetes? The most important action people diagnosed with prediabetes can take is to focus on living a healthy lifestyle. This includes making healthy food choices, controlling portions, and increasing physical activity. Regarding weight control, research shows losing 5-7% (often about 10-20 lbs.) from your initial body weight and keeping off as much of that weight over time as possible is critical to lowering the risk of type 2 diabetes. This task is of course easier said than done, but sustained weight loss over time can be key to improving health and delaying or preventing the onset of type 2 diabetes. Several prediabetes interventions exist based on evidence from the landmark Diabetes Prevention Program (DPP) study. The DPP study reported that moderate weight loss (5-7% of body weight, or ~10-15 lbs.   for someone weighing 200 lbs.), counseling, and education on healthy eating and behavior reduced the risk of developing type 2 diabetes by 58%. Data presented at the ADA 2014 conference showed that after 15 years of follow-up of the DPP study groups, the results were still encouraging: 27% of those in the original lifestyle group had a significant reduction in type 2 diabetes progression compared to the control group. If you or someone you know has been told they have prediabetes, here are a few helpful resources: In-person diabetes prevention programs: The CDC offers a one year long  lifestyle change program through its National Diabetes Prevention Program (NDPP) at various locations throughout the [place] to help participants adopt healthy habits and prevent or delay progression to type 2 diabetes. This program is a major undertaking by the CDC to translate the findings from the DPP study into a real world setting, a significant effort indeed! Online diabetes prevention programs: The CDC has now given pending recognition status to three digital prevention programs: [facility], [facility], and [facility]. These offer the same one year long educational curriculum as the DPP study, but in an online format. Some insurance companies and employers cover these programs, and you can find more information at the links above. These digital versions are excellent options for those who live far away from NDPP locations or who prefer the anonymity and convenience of doing the program online. Metformin: The DPP study found that metformin, the safest first-line therapy for type 2 diabetes, may help delay the onset of type 2 diabetes in people with prediabetes. Participants who took the low-cost generic drug had a 31% reduced risk of developing type 2 diabetes compared to the control group (those not on metformin or intensive lifestyle intervention). Again, 15-year follow up data showed that 17% of those on metformin continued to have a significant reduction in type 2 progression. At this time, metformin (or any other medication, for that matter) is not currently FDA approved for prediabetes, and it is sometimes prescribed "off-label" by a healthcare provider. Your healthcare provider can give you more information and determine whether metformin is a good option for you.  Can prediabetes be "cured"? In the early stages of prediabetes (and type 2 diabetes), diligent attention to food choices and activity, and most importantly weight loss, can improve blood sugar numbers, effectively "reversing" the disease  and reducing the odds of developing type 2 diabetes. However, some people may have underlying factors (such as family history and genetics) that put them at a greater risk of type 2 diabetes, meaning they will always require careful attention to blood sugar levels and lifestyle choices. Returning to old habits will likely put someone back on the road to prediabetes, and eventually, type 2 diabetes   

## 2014-09-30 LAB — GLUCOSE, POCT (MANUAL RESULT ENTRY): POC GLUCOSE: 112 mg/dL — AB (ref 70–99)

## 2014-10-16 ENCOUNTER — Other Ambulatory Visit: Payer: Self-pay

## 2014-10-16 MED ORDER — FUROSEMIDE 20 MG PO TABS: 20.0000 mg | ORAL_TABLET | Freq: Every day | ORAL | Status: DC | PRN

## 2014-11-09 ENCOUNTER — Encounter: Payer: Self-pay | Admitting: Pharmacist

## 2014-11-09 ENCOUNTER — Ambulatory Visit (INDEPENDENT_AMBULATORY_CARE_PROVIDER_SITE_OTHER): Payer: Medicare Other | Admitting: Pharmacist

## 2014-11-09 MED ORDER — MELATONIN 1 MG PO TABS: 2.0000 mg | ORAL_TABLET | Freq: Every evening | ORAL | Status: DC

## 2014-11-09 NOTE — Progress Notes (Signed)
Subjective:     Wallace CullensJames C Covino is a 34 y.o. male here for reevaluation of weight loss/obesity.  He has been taking phentermine 37.5mg  1 tablet daily but has cut back recently and has only been taking about 3 to 4 days per week.  He has lost 4# since his last week 6 weeks ago.   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     He feels ideal weight is 225 pounds. History of eating disorders: none. There is a family history positive for obesity in the patient, father and brother. Previous treatments for obesity include self-directed dieting. Obesity associated medical conditions: none. Obesity associated medications: none. Cardiovascular risk factors besides obesity: dyslipidemia, male gender and obesity (BMI >= 30 kg/m2).  Exercise - lifts weights (2 - 3 times per week). Sometimes does treadmill at the gym. Has recently added aerobic activity 1-2 times per week.   Patient states that a few days per week he has had difficulty sleeping but he denies that insomnia occurs on the days that he take phentermine.  The following portions of the patient's history were reviewed and updated as appropriate: allergies, current medications, past family history, past medical history, past social history, past surgical history and problem list.   Objective:    Body mass index is 49.73 kg/(m^2).   Filed Vitals:   11/09/14 1453  BP: 130/76  Pulse: 87    Filed Weights   11/09/14 1453  Weight: 327 lb (148.326 kg)    Assessment:    Obesity. I assessed [name] to be in an action stage with respect to weight loss.  Metabolic syndrome with family history of diabetes   Plan:      1.General weight loss/lifestyle modification strategies discussed (elicit support from others; identify saboteurs; non-food rewards, etc). Encouraged patient with keeping  up with the healthy diet and regular exercise. Discussed choosing low sodium options when available and limiting red meats and dairy products to help lower cholesterol. Agreed with patient to mix up different types of exercises to avoid a plateau in losing weight and health benefits.   2. Behavioral treatment: Slim Fast and continue to limit calories/ increase vegetablet and fruits/ limit sugar and beef.   Suggested adding protein to smoothie - unsweetened [place] yogurt, unsweetened almond milk or PB2 / nut butter 3. Diet interventions: moderate (500 kCal/d) deficit diet and reviewed eating out options.   4.  Melatonin 1mg  2 to 3 tablets each evening 5.. Continue regular exercise program.  RTC in 6 weeks    Henrene Pastorammy Kanyla Omeara, PharmD, CPP

## 2014-11-09 NOTE — Patient Instructions (Addendum)
Melatonin oral capsules and tablets  What is this medicine? MELATONIN (mel uh TOH nin) is a dietary supplement. It is promoted to help maintain normal sleep patterns. The FDA has not approved this supplement for any medical use. This supplement may be used for other purposes; ask your health care provider or pharmacist if you have questions. This medicine may be used for other purposes; ask your health care provider or pharmacist if you have questions. COMMON BRAND NAME(S): Melatonex What should I tell my health care provider before I take this medicine? They need to know if you have any of these conditions: -cancer -if you frequently drink alcohol containing drinks -immune system problems -liver disease -seizure disorder -an unusual or allergic reaction to melatonin, other medicines, foods, dyes, or preservatives -pregnant or trying to get pregnant -breast-feeding How should I use this medicine? Take this supplement by mouth with a glass of water. This supplement is usually taken prior to bedtime. Do not chew or crush most tablets or capsules. Some tablets are chewable and are chewed before swallowing. Some tablets are meant to be dissolved in the mouth or under the tongue. Follow the directions on the package labeling, or take as directed by your health care professional. Do not take this supplement more often than directed. Talk to your pediatrician regarding the use of this supplement in children. This supplement is not recommended for use in children. Overdosage: If you think you have taken too much of this medicine contact a poison control center or emergency room at once. NOTE: This medicine is only for you. Do not share this medicine with others. What if I miss a dose? This does not apply; this medicine is not for regular use. Do not take double or extra doses. What may interact with this medicine? Check with your doctor or healthcare professional if you are taking any of the  following medications: -hormone medicines -medicines for blood pressure like nifedipine -medications for anxiety, depression, or other emotional or psychiatric problems -medications for seizures -medications for sleep -other herbal or dietary supplements -tamoxifen -treatments for cancer or immune disorders This list may not describe all possible interactions. Give your health care provider a list of all the medicines, herbs, non-prescription drugs, or dietary supplements you use. Also tell them if you smoke, drink alcohol, or use illegal drugs. Some items may interact with your medicine. What should I watch for while using this medicine? See your doctor if your symptoms do not get better or if they get worse. Do not take this supplement for more than 2 weeks unless your doctor tells you to. You may get drowsy or dizzy. Do not drive, use machinery, or do anything that needs mental alertness until you know how this medicine affects you. Do not stand or sit up quickly, especially if you are an older patient. This reduces the risk of dizzy or fainting spells. Alcohol may interfere with the effect of this medicine. Avoid alcoholic drinks. Talk to your doctor before you use this supplement if you are currently being treated for an emotional, mental, or sleep problem. This medicine may interfere with your treatment. Herbal or dietary supplements are not regulated like medicines. Rigid quality control standards are not required for dietary supplements. The purity and strength of these products can vary. The safety and effect of this dietary supplement for a certain disease or illness is not well known. This product is not intended to diagnose, treat, cure or prevent any disease. The Food and Drug  Administration suggests the following to help consumers protect themselves: -Always read product labels and follow directions. -Natural does not mean a product is safe for humans to take. -Look for products that  include USP after the ingredient name. This means that the manufacturer followed the standards of the [place] Pharmacopoeia. -Supplements made or sold by a nationally known food or drug company are more likely to be made under tight controls. You can write to the company for more information about how the product was made. What side effects may I notice from receiving this medicine? Side effects that you should report to your doctor or health care professional as soon as possible: -allergic reactions like skin rash, itching or hives, swelling of the face, lips, or tongue -breathing problems -confusion, forgetful -depressed, nervous, or other mood changes -fast or pounding heartbeat -trouble staying awake or alert during the day Side effects that usually do not require medical attention (report to your doctor or health care professional if they continue or are bothersome): -drowsiness, dizziness -headache -nightmares -upset stomach This list may not describe all possible side effects. Call your doctor for medical advice about side effects. You may report side effects to FDA at 1-800-FDA-1088. Where should I keep my medicine? Keep out of the reach of children. Store at room temperature or as directed on the package label. Protect from moisture. Throw away any unused supplement after the expiration date. NOTE: This sheet is a summary. It may not cover all possible information. If you have questions about this medicine, talk to your doctor, pharmacist, or health care provider.  2015, Elsevier/Gold Standard. (2013-02-25 18:36:27)    Try adding protein to smoothie - try low fat, unsweetened Greek yogurt; PB2 (dehydrated peanut butter / peanuts) or almond milk (unsweetened)

## 2014-11-10 ENCOUNTER — Other Ambulatory Visit: Payer: Self-pay | Admitting: Pharmacist

## 2014-11-10 MED ORDER — PHENTERMINE HCL 37.5 MG PO TABS: ORAL_TABLET | ORAL | Status: DC

## 2014-11-19 DIAGNOSIS — S80811A Abrasion, right lower leg, initial encounter: Secondary | ICD-10-CM | POA: Diagnosis not present

## 2014-12-21 ENCOUNTER — Ambulatory Visit (INDEPENDENT_AMBULATORY_CARE_PROVIDER_SITE_OTHER): Payer: Medicare Other | Admitting: Pharmacist

## 2014-12-21 VITALS — BP 132/80 | HR 77 | Ht 68.0 in | Wt 332.5 lb

## 2014-12-21 DIAGNOSIS — E669 Obesity, unspecified: Secondary | ICD-10-CM | POA: Diagnosis not present

## 2014-12-21 DIAGNOSIS — E8881 Metabolic syndrome: Secondary | ICD-10-CM | POA: Diagnosis not present

## 2014-12-21 NOTE — Patient Instructions (Signed)
Hypertension Hypertension, commonly called high blood pressure, is when the force of blood pumping through your arteries is too strong. Your arteries are the blood vessels that carry blood from your heart throughout your body. A blood pressure reading consists of a higher number over a lower number, such as 110/72. The higher number (systolic) is the pressure inside your arteries when your heart pumps. The lower number (diastolic) is the pressure inside your arteries when your heart relaxes. Ideally you want your blood pressure below 120/80. Hypertension is when your blood pressure is over 140/90. Hypertension forces your heart to work harder to pump blood. Your arteries may become narrow or stiff. Having hypertension puts you at risk for heart disease, stroke, and other problems.   RISK FACTORS Some risk factors for high blood pressure are controllable. Others are not.  Risk factors you cannot control include:   Race. You may be at higher risk if you are African American.  Age. Risk increases with age.  Gender. Men are at higher risk than women before age 75 years. After age 41, women are at higher risk than men. Risk factors you can control include:  Not getting enough exercise or physical activity.  Being overweight.  Getting too much fat, sugar, calories, or salt in your diet.  Drinking too much alcohol. SIGNS AND SYMPTOMS Hypertension does not usually cause signs or symptoms. Extremely high blood pressure (hypertensive crisis) may cause headache, anxiety, shortness of breath, and nosebleed. DIAGNOSIS  To check if you have hypertension, your health care provider will measure your blood pressure while you are seated, with your arm held at the level of your heart. It should be measured at least twice using the same arm. Certain conditions can cause a difference in blood pressure between your right and left arms. A blood pressure reading that is higher than normal on one occasion does not  mean that you need treatment. If one blood pressure reading is high, ask your health care provider about having it checked again. TREATMENT  Treating high blood pressure includes making lifestyle changes and possibly taking medicine. Living a healthy lifestyle can help lower high blood pressure. You may need to change some of your habits. Lifestyle changes may include:  Following the DASH diet. This diet is high in fruits, vegetables, and whole grains. It is low in salt, red meat, and added sugars.  Getting at least 2 hours of brisk physical activity every week.  Losing weight if necessary.  Not smoking.  Limiting alcoholic beverages.  Learning ways to reduce stress. If lifestyle changes are not enough to get your blood pressure under control, your health care provider may prescribe medicine. You may need to take more than one. Work closely with your health care provider to understand the risks and benefits. HOME CARE INSTRUCTIONS  Have your blood pressure rechecked as directed by your health care provider.   Take medicines only as directed by your health care provider. Follow the directions carefully. Blood pressure medicines must be taken as prescribed. The medicine does not work as well when you skip doses. Skipping doses also puts you at risk for problems.   Do not smoke.   Monitor your blood pressure at home as directed by your health care provider. SEEK MEDICAL CARE IF:   You think you are having a reaction to medicines taken.  You have recurrent headaches or feel dizzy.  You have swelling in your ankles.  You have trouble with your vision. SEEK IMMEDIATE MEDICAL  CARE IF:  You develop a severe headache or confusion.  You have unusual weakness, numbness, or feel faint.  You have severe chest or abdominal pain.  You vomit repeatedly.  You have trouble breathing. MAKE SURE YOU:   Understand these instructions.  Will watch your condition.  Will get help  right away if you are not doing well or get worse. Document Released: 04/14/2005 Document Revised: 08/29/2013 Document Reviewed: 02/04/2013 [facility] Patient Information 2015 [place], [place]. This information is not intended to replace advice given to you by your health care provider. Make sure you discuss any questions you have with your health care provider.

## 2014-12-21 NOTE — Progress Notes (Signed)
Subjective:     Justin Schmidt is a 34 y.o. male here for reevaluation of weight loss/obesity.  He has been taking phentermine 37.5mg  1 tablet daily but has cut back recently and has only been taking about 3 to 4 days per week.  He has lost 4# since his last week 6 weeks ago.   Patient has started eating a healthy diet (slimfast and fruit or smoothie (kale, water, honey, banana and blueberries) in am, chicken and vegetable in pm).  Eating 1/2 portion sizes at restaurants.     He feels ideal weight is 225 pounds. History of eating disorders: none. There is a family history positive for obesity in the patient, father and brother. Previous treatments for obesity include self-directed dieting. Obesity associated medical conditions: none. Obesity associated medications: none. Cardiovascular risk factors besides obesity: dyslipidemia, male gender and obesity (BMI >= 30 kg/m2).  Exercise - lifts weights (2 - 3 times per week) though he has been doing less lately due to back injury. Sometimes does treadmill at the gym. Has recently added aerobic activity 1-2 times per week.  Reports that sleep is better since he started melatonin bout 6 weeks ago.   The following portions of the patient's history were reviewed and updated as appropriate: allergies, current medications, past family history, past medical history, past social history, past surgical history and problem list.   Objective:    Body mass index is 50.57 kg/(m^2).   Filed Vitals:   12/21/14 1435  BP: 132/80  Pulse: 77    Filed Weights   12/21/14 1435  Weight: 332 lb 8 oz (150.821 kg)    Assessment:    Obesity. I assessed Justin Schmidt to be in an action stage with respect to weight loss.  Metabolic syndrome with family history of diabetes   Plan:      1.General weight loss/lifestyle modification strategies discussed (elicit support from others; identify saboteurs; non-food rewards, etc). Encouraged patient to continue healthy diet and  regular exercise. Discussed choosing low sodium options when available and limiting red meats and dairy products to help lower cholesterol. Agreed with patient to mix up different types of exercises to avoid a plateau in losing weight and health benefits.  I also suggested he make appt with Dr Otelia Sergeant his ortho if back pain continues. 2.  Continue phentermine 37.5mg  take 1 table tqam 3.  Continue Melatonin  tablets each evening 5.. Continue regular exercise program as long as no back pain.  RTC in 6 weeks    Henrene Pastor, PharmD, CPP

## 2014-12-22 ENCOUNTER — Encounter: Payer: Self-pay | Admitting: Pharmacist

## 2015-02-01 ENCOUNTER — Encounter: Payer: Self-pay | Admitting: Pharmacist

## 2015-02-01 ENCOUNTER — Ambulatory Visit (INDEPENDENT_AMBULATORY_CARE_PROVIDER_SITE_OTHER): Payer: Medicare Other | Admitting: Pharmacist

## 2015-02-01 VITALS — BP 126/70 | HR 77 | Ht 68.0 in | Wt 334.5 lb

## 2015-02-01 DIAGNOSIS — Z23 Encounter for immunization: Secondary | ICD-10-CM

## 2015-02-01 DIAGNOSIS — E669 Obesity, unspecified: Secondary | ICD-10-CM

## 2015-02-01 DIAGNOSIS — E8881 Metabolic syndrome: Secondary | ICD-10-CM

## 2015-02-01 NOTE — Patient Instructions (Signed)
Take furosemide  - take 2 tablets daily for 3 days, then return to 1 tablet daily as needed.

## 2015-02-01 NOTE — Progress Notes (Signed)
Subjective:     Justin Schmidt is a 34 y.o. male here for reevaluation of weight loss/obesity.  He has been taking phentermine 37.5mg  1 tablet daily but has cut back recently and has only been taking about 3 to 4 days per week.  He has not lost any weight since his last visit 6 weeks ago.   Patient has started eating a healthy diet (slimfast and fruit or smoothie (kale, water, honey, banana and blueberries) in am, chicken and vegetable in pm).  Eating 1/2 portion sizes at restaurants. He does admit to eating out more recently.  Eating more slaw and pintos.  He also reports that most days he is only eating 2 meals and 2 snacks.      He feels ideal weight is 225 pounds. History of eating disorders: none. There is a family history positive for obesity in the patient, father and brother. Previous treatments for obesity include self-directed dieting. Obesity associated medical conditions: none. Obesity associated medications: none. Cardiovascular risk factors besides obesity: dyslipidemia, male gender and obesity (BMI >= 30 kg/m2).  Exercise - lifts weights (2 - 3 times per week).  Since our last visit he has been lifting less weight but with more repetitions.  He feels that his muscles are leaner and clothes are fitting better even though weigh on scales has not improved.  Sometimes does treadmill at the gym. Is doing aerobic activity 1-2 times per week.   The following portions of the patient's history were reviewed and updated as appropriate: allergies, current medications, past family history, past medical history, past social history, past surgical history and problem list.   Objective:    Body mass index is 50.87 kg/(m^2).   Filed Vitals:   02/01/15 1434  BP: 126/70  Pulse: 77    Filed Weights   02/01/15 1434  Weight: 334 lb 8 oz (151.728 kg)   LEE - 1+ Assessment:    Obesity. I assessed Justin Schmidt to be in an action stage with respect to weight loss.  Metabolic syndrome with family history  of diabetes  Bilateral LEE   Plan:      1.General weight loss/lifestyle modification strategies discussed (elicit support from others; identify saboteurs; non-food rewards, etc). Encouraged patient to continue healthy diet and regular exercise. Discussed eating 3 small meals daily.  Also gave list of breakfast, lunch and dinner ideas.  He was encouraged to prepare more meals at home. 2.  Will take a break from phentermine - I have looked into other options but other options are not covered by his insurance and are too expensive otherwise. 3.  Influenza vaccines given in office today 4.. Continue regular exercise program  5.  Increase furosemide to  daily for 3 days and then return to  daily as needed for edema.   RTC in 6 weeks  - Needs follow up with provider - appt make with Dr Justin Schmidt at patient's request.   Henrene Pastor, PharmD, CPP, CDE

## 2015-03-15 ENCOUNTER — Ambulatory Visit: Payer: Self-pay | Admitting: Pediatrics

## 2015-03-20 ENCOUNTER — Ambulatory Visit (INDEPENDENT_AMBULATORY_CARE_PROVIDER_SITE_OTHER): Payer: Medicare Other | Admitting: Pediatrics

## 2015-03-20 ENCOUNTER — Encounter: Payer: Self-pay | Admitting: Pediatrics

## 2015-03-20 VITALS — BP 139/76 | HR 102 | Temp 98.3°F | Ht 68.0 in | Wt 342.0 lb

## 2015-03-20 DIAGNOSIS — R748 Abnormal levels of other serum enzymes: Secondary | ICD-10-CM | POA: Diagnosis not present

## 2015-03-20 DIAGNOSIS — F32A Depression, unspecified: Secondary | ICD-10-CM

## 2015-03-20 DIAGNOSIS — R609 Edema, unspecified: Secondary | ICD-10-CM

## 2015-03-20 DIAGNOSIS — J329 Chronic sinusitis, unspecified: Secondary | ICD-10-CM | POA: Diagnosis not present

## 2015-03-20 DIAGNOSIS — R160 Hepatomegaly, not elsewhere classified: Secondary | ICD-10-CM

## 2015-03-20 DIAGNOSIS — F329 Major depressive disorder, single episode, unspecified: Secondary | ICD-10-CM

## 2015-03-20 MED ORDER — FUROSEMIDE 20 MG PO TABS: 20.0000 mg | ORAL_TABLET | Freq: Every day | ORAL | Status: DC | PRN

## 2015-03-20 MED ORDER — FLUTICASONE PROPIONATE 50 MCG/ACT NA SUSP: 2.0000 | Freq: Every day | NASAL | Status: DC

## 2015-03-20 NOTE — Patient Instructions (Signed)
   24 hour a day CRISIS NUMBER: [phone]   List of local counseling services:  [facility]- Therapist [address] [place] ,[place] [address] [phone] Children limited to anxiety and depression- NO ADD/ADHD Does not accept Medicaid  [facility] [address] [place], [place] [phone] Does see children Does accept medicaid Will assess for Autism but not treat  [facility] [address] [place] [phone] Does see children  Does accept Medicaid Medication management- substance abuse- bipolar- grief- family-marriage- OCD- Anxiety- PTSD  [facility] [address] [place]  [phone] Does see children Does accept medicaid They do perform psychological testing  [facility] [address] [place] Schedule through [employer] [phone] Patient must call and make own appointment Does se children Does accept Medicaid  [facility] [address] [place], [place] [phone] Sees Children 7-10 accompanied by an adult, 11 and up by themselves Does accept Medicaid Will see patients with- substance abuse-ADHD-ADD-Bipolar-Domestic violence-Marriage counseling- Family Counseling and sexual abuse  [facility]- Psychologist and Psychiatrist [address] [place] [phone] Does see children Does accept Medicaid  [facility] [address] [place] [phone]  Dr. Lugo-  Psychiatrist [address] [place], [place] [phone] Specializes in ADHD and addictions They do ADHD testing Suboxone clinic  [facility] [address] [place] [phone] Does Child psychological testing  [facility] [address] [place] [phone] Does Accept Medicaid Evaluates for Autism  [facility] [address] [place] [phone] Does Not accept Medicaid Does do adult ADD evaluations  Dr. Akinlayo [address] [place] [phone] Does not Take Medicaid Sees ADD and ADHD for treatment      [facility] [address] , [place] [address] [phone] Takes Medicaid WIll see children as young as 3   

## 2015-03-20 NOTE — Progress Notes (Signed)
Subjective:    Patient ID: Justin Schmidt, male    DOB: [birth year], 34 y.o.   MRN: [id number]  CC: multiple med problem f/u  HPI: Justin Schmidt is a 34 y.o. male presenting on 03/20/2015 for Establish Care  Liting weights and doing karate every other day Some foot pain with walking Has been following with Tammy for weight. Initially lost some, now has been stable Mood has been down due to social stressors, not working now, caring after family member with dementia, feels underappreciated by other family members. Here today with his father who is very supportive No chest pain with activity, no abd pain. No SOB. Not a smoker. No SI/HI No joint pain  Depression screen [facility] 2/9 03/20/2015 09/29/2014 03/09/2014 07/18/2013 06/27/2013  Decreased Interest 3 0 1 0 0  Down, Depressed, Hopeless 3 0 2 0 0  PHQ - 2 Score 6 0 3 0 0  Altered sleeping 3 - 3 - -  Tired, decreased energy 3 - 1 - -  Change in appetite 1 - 1 - -  Feeling bad or failure about yourself  3 - 2 - -  Trouble concentrating 0 - 0 - -  Moving slowly or fidgety/restless 1 - 0 - -  Suicidal thoughts 0 - 0 - -  PHQ-9 Score 17 - 10 - -  Difficult doing work/chores Very difficult - - - -     Relevant past medical, surgical, family and social history reviewed and updated as indicated. Interim medical history since our last visit reviewed. Allergies and medications reviewed and updated.   ROS: Per HPI unless specifically indicated above  Past Medical History Patient Active Problem List   Diagnosis Date Noted  . Metabolic syndrome 02/01/2015  . Pain in joint, ankle and foot 12/15/2013  . Syndrome X, metabolic 10/24/2013  . Severe obesity (BMI >= 40) (HCC) 09/05/2013  . Hypertriglyceridemia 09/05/2013    Current Outpatient Prescriptions  Medication Sig Dispense Refill  . acetaminophen-codeine (TYLENOL #3) 300-30 MG per tablet Take 1 tablet by mouth every 4 (four) hours as needed for moderate pain.    . Arginine 500 MG  CAPS Take 1 capsule by mouth daily.    . Ascorbic Acid (VITAMIN C) 1000 MG tablet Take 1,000 mg by mouth daily.    . Coenzyme Q10 100 MG TABS Take 1 tablet (100 mg total) by mouth daily.  0  . Flaxseed, Linseed, (FLAX SEED OIL PO) Take 1,200 mg by mouth daily.    . furosemide (LASIX) 20 MG tablet Take 1 tablet (20 mg total) by mouth daily as needed. 30 tablet 3  . Melatonin 1 MG TABS Take 2-3 tablets (2-3 mg total) by mouth every evening.    . Misc Natural Products (GREEN TEA) TABS Take 315 mg by mouth daily.    . Multiple Vitamin (MULTIVITAMIN WITH MINERALS) TABS Take 1 tablet by mouth daily.    . Nutritional Supplements (DHEA PO) Take 50 mg by mouth daily. On days after workout    . TURMERIC CURCUMIN PO Take 1 capsule by mouth daily.    . Cholecalciferol (VITAMIN D) 2000 UNITS tablet Take 2,000 Units by mouth daily.    . fluticasone (FLONASE) 50 MCG/ACT nasal spray Place 2 sprays into both nostrils daily. 16 g 2  . mupirocin ointment (BACTROBAN) 2 %      No current facility-administered medications for this visit.   Facility-Administered Medications Ordered in Other Visits  Medication Dose Route Frequency Provider  Last Rate Last Dose  . sodium chloride irrigation 0.9 %    PRN Tilford PillarBrent Ziegler, MD   3,000 mL at 01/27/12 0739       Objective:    BP 139/76 mmHg  Pulse 102  Temp(Src) 98.3 F (36.8 C) (Oral)  Ht 5\' 8"  (1.727 m)  Wt 342 lb (155.13 kg)  BMI 52.01 kg/m2  Wt Readings from Last 3 Encounters:  03/20/15 342 lb (155.13 kg)  02/01/15 334 lb 8 oz (151.728 kg)  12/21/14 332 lb 8 oz (150.821 kg)    Gen: NAD, alert, cooperative with exam, NCAT EYES: EOMI, no scleral injection or icterus ENT:  TMs pearly gray b/l, OP without erythema LYMPH: no cervical LAD CV: NRRR, normal S1/S2, no murmur, distal pulses 2+ b/l Resp: CTABL, no wheezes, normal WOB Abd: +BS, obese, soft, NTND. no guarding, not able to assess organomegaly due to obesity Ext: No edema, warm Neuro: Alert and  oriented, strength equal b/l UE and LE, coordination grossly normal MSK: normal muscle bulk Psych: full affect     Assessment & Plan:   [name] was seen today for multiple med problem f/u.  Diagnoses and all orders for this visit:  Chronic sinusitis, unspecified location -     fluticasone (FLONASE) 50 MCG/ACT nasal spray; Place 2 sprays into both nostrils daily.  Elevated liver enzymes and Enlarged liver At risk for hepatic steatosis, had an abnormal liver u/s 3 years ago in which cross-sectional imaging was recommended to eval for cirrhosis. No other work up, including no prior hepatitis serologies. Will repeat LFTs and get CT scan for eval of liver. -     Hepatic function panel       -     CT Abd Wo & W Cm; Future  Edema, unspecified type Well controlled as long as takes the lasix daily -     furosemide (LASIX) 20 MG tablet; Take 1 tablet (20 mg total) by mouth daily as needed.  Depression Positive PHQ 9. Denies thoughts of self harm. Is the sole caregiver for  His grandfather with dementia, here with his father today, has family stressors ongoing. Discussed starting medicine vs tryign therapy first. Gave list of therapists in the area. Will continue to reassess mood. Feels safe at home, denies thoughts about hurting himself. Dad has been very supportive.  Follow up plan: Return in about 8 weeks (around 05/15/2015).  Rex Krasarol Danisha Brassfield, MD [facility] 03/20/2015, 3:08 PM

## 2015-03-21 LAB — HEPATIC FUNCTION PANEL
ALK PHOS: 62 IU/L (ref 39–117)
ALT: 63 IU/L — AB (ref 0–44)
AST: 30 IU/L (ref 0–40)
Albumin: 4.3 g/dL (ref 3.5–5.5)
BILIRUBIN, DIRECT: 0.1 mg/dL (ref 0.00–0.40)
Bilirubin Total: 0.3 mg/dL (ref 0.0–1.2)
Total Protein: 7.2 g/dL (ref 6.0–8.5)

## 2015-03-26 ENCOUNTER — Ambulatory Visit (HOSPITAL_COMMUNITY)
Admission: RE | Admit: 2015-03-26 | Discharge: 2015-03-26 | Disposition: A | Discharge status: Home / Self Care | Payer: Medicare Other | Source: Ambulatory Visit | Attending: Pediatrics | Admitting: Pediatrics

## 2015-03-26 DIAGNOSIS — R634 Abnormal weight loss: Secondary | ICD-10-CM | POA: Diagnosis not present

## 2015-03-26 DIAGNOSIS — Z9049 Acquired absence of other specified parts of digestive tract: Secondary | ICD-10-CM | POA: Diagnosis not present

## 2015-03-26 DIAGNOSIS — K760 Fatty (change of) liver, not elsewhere classified: Secondary | ICD-10-CM | POA: Diagnosis not present

## 2015-03-26 DIAGNOSIS — R160 Hepatomegaly, not elsewhere classified: Secondary | ICD-10-CM | POA: Diagnosis not present

## 2015-03-26 MED ORDER — DIATRIZOATE MEGLUMINE & SODIUM 66-10 % PO SOLN
ORAL | Status: AC
  Filled 2015-03-26: qty 30

## 2015-03-26 MED ORDER — SODIUM CHLORIDE 0.9 % IJ SOLN
INTRAMUSCULAR | Status: AC
  Filled 2015-03-26: qty 1000

## 2015-03-26 MED ORDER — IOHEXOL 300 MG/ML  SOLN
150.0000 mL | Freq: Once | INTRAMUSCULAR | Status: AC | PRN
  Administered 2015-03-26: 150 mL via INTRAVENOUS

## 2015-03-30 ENCOUNTER — Encounter: Payer: Self-pay | Admitting: *Deleted

## 2015-05-16 ENCOUNTER — Encounter: Payer: Self-pay | Admitting: Pediatrics

## 2015-05-16 ENCOUNTER — Ambulatory Visit: Payer: Medicare Other | Admitting: Pediatrics

## 2015-05-16 ENCOUNTER — Ambulatory Visit (INDEPENDENT_AMBULATORY_CARE_PROVIDER_SITE_OTHER): Payer: Medicare Other | Admitting: Pediatrics

## 2015-05-16 DIAGNOSIS — F439 Reaction to severe stress, unspecified: Secondary | ICD-10-CM

## 2015-05-16 DIAGNOSIS — R7401 Elevation of levels of liver transaminase levels: Secondary | ICD-10-CM

## 2015-05-16 DIAGNOSIS — R740 Nonspecific elevation of levels of transaminase and lactic acid dehydrogenase [LDH]: Secondary | ICD-10-CM

## 2015-05-16 DIAGNOSIS — K760 Fatty (change of) liver, not elsewhere classified: Secondary | ICD-10-CM | POA: Diagnosis not present

## 2015-05-16 DIAGNOSIS — Z638 Other specified problems related to primary support group: Secondary | ICD-10-CM

## 2015-05-16 NOTE — Progress Notes (Signed)
Subjective:    Patient ID: Justin Schmidt, male    DOB: 03-Sep-1980, 35 y.o.   MRN: [id number]  CC: Follow-up BMI, mood  HPI: Justin Schmidt is a 35 y.o. male presenting for Follow-up  Here with his father 24h recall Breakfast: Toasted [place] and cheese sandwich  Snack: Steamed broccoli and cheese around 3-4pm 5pm peanut butter and banana sandwich Went to the gym Dinner: Then had spicy chicken salad and a frosty Midnight: hamburger from [employer]  Stress worsening at home, still taking care of grandfather with dementia, getting along poorly with other family members including GF's HCPOA Mood has been up and down with recent stress, feels safe at home   Depression screen [facility] 2/9 05/16/2015 03/20/2015 09/29/2014 03/09/2014 07/18/2013  Decreased Interest 2 3 0 1 0  Down, Depressed, Hopeless 2 3 0 2 0  PHQ - 2 Score 4 6 0 3 0  Altered sleeping 3 3 - 3 -  Tired, decreased energy 2 3 - 1 -  Change in appetite 3 1 - 1 -  Feeling bad or failure about yourself  3 3 - 2 -  Trouble concentrating 2 0 - 0 -  Moving slowly or fidgety/restless 1 1 - 0 -  Suicidal thoughts 0 0 - 0 -  PHQ-9 Score 18 17 - 10 -  Difficult doing work/chores Somewhat difficult Very difficult - - -     Relevant past medical, surgical, family and social history reviewed and updated as indicated. Interim medical history since our last visit reviewed. Allergies and medications reviewed and updated.    ROS: Per HPI unless specifically indicated above  History  Smoking status  . Never Smoker   Smokeless tobacco  . Never Used    Past Medical History Patient Active Problem List   Diagnosis Date Noted  . Fatty liver 05/17/2015  . Elevated transaminase level 05/17/2015  . Stress at home 05/17/2015  . Metabolic syndrome 02/01/2015  . Pain in joint, ankle and foot 12/15/2013  . Syndrome X, metabolic 10/24/2013  . Severe obesity (BMI >= 40) (HCC) 09/05/2013  . Hypertriglyceridemia 09/05/2013          Objective:    BP 128/81 mmHg  Pulse 85  Temp(Src) 97.4 F (36.3 C) (Oral)  Ht  (1.727 m)  Wt 351 lb 3.2 oz (159.303 kg)  BMI 53.41 kg/m2  Wt Readings from Last 3 Encounters:  05/16/15 351 lb 3.2 oz (159.303 kg)  03/20/15 342 lb (155.13 kg)  02/01/15 334 lb 8 oz (151.728 kg)     Gen: NAD, alert, cooperative with exam, NCAT EYES: EOMI, no scleral injection or icterus ENT:   OP without erythema LYMPH: no cervical LAD CV: NRRR, normal S1/S2, no murmur, distal pulses 2+ b/l Resp: CTABL, no wheezes, normal WOB Abd: +BS, soft, NTND. Ext: No edema, warm Neuro: Alert and oriented MSK: normal muscle bulk Psych: normal affect     Assessment & Plan:    Aimar was seen today for follow-up multiple med problems  Diagnoses and all orders for this visit:  Morbid obesity, unspecified obesity type (HCC) Continued to discuss lifestyle changes. Goals: 3 meals a day, cut out fast food, gym 4-5 times a week  Elevated transaminase level Check levels next visit  Stress at home Strongly recommended counseling, upcoming decisions for where to live, what to do next with his time if living [place] farm and working it does not work out as he has been planning. Pt not  interested in medication at this time. Will let me know if mood is worsening, says he feels it has been ok for now. Gave list for counselors in area.   Follow up plan: Return in about 6 months (around 11/13/2015).  Rex Kras, MD [facility] 05/16/2015, 2:25 PM

## 2015-05-17 DIAGNOSIS — K760 Fatty (change of) liver, not elsewhere classified: Secondary | ICD-10-CM | POA: Insufficient documentation

## 2015-05-17 DIAGNOSIS — F439 Reaction to severe stress, unspecified: Secondary | ICD-10-CM | POA: Insufficient documentation

## 2015-05-17 DIAGNOSIS — R740 Nonspecific elevation of levels of transaminase and lactic acid dehydrogenase [LDH]: Secondary | ICD-10-CM

## 2015-05-17 DIAGNOSIS — R7401 Elevation of levels of liver transaminase levels: Secondary | ICD-10-CM | POA: Insufficient documentation

## 2015-10-08 ENCOUNTER — Ambulatory Visit (INDEPENDENT_AMBULATORY_CARE_PROVIDER_SITE_OTHER): Payer: Medicare Other

## 2015-10-08 ENCOUNTER — Ambulatory Visit (INDEPENDENT_AMBULATORY_CARE_PROVIDER_SITE_OTHER): Payer: Medicare Other | Admitting: Family

## 2015-10-08 ENCOUNTER — Encounter: Payer: Self-pay | Admitting: Family

## 2015-10-08 VITALS — BP 123/82 | HR 94 | Temp 97.7°F | Ht 68.0 in | Wt 361.2 lb

## 2015-10-08 DIAGNOSIS — M25571 Pain in right ankle and joints of right foot: Secondary | ICD-10-CM | POA: Diagnosis not present

## 2015-10-08 DIAGNOSIS — S93401A Sprain of unspecified ligament of right ankle, initial encounter: Secondary | ICD-10-CM | POA: Diagnosis not present

## 2015-10-08 MED ORDER — NAPROXEN 500 MG PO TABS: 500.0000 mg | ORAL_TABLET | Freq: Two times a day (BID) | ORAL | Status: DC

## 2015-10-08 NOTE — Progress Notes (Signed)
   Subjective:    Patient ID: Justin Schmidt, male    DOB: 05/20/1980, 35 y.o.   MRN: [id number]  Foot Injury  The incident occurred 3 to 5 days ago. The incident occurred in the yard. The injury mechanism is unknown (working outside in the yard Friday evening). The pain is present in the right foot. The quality of the pain is described as aching. The pain is at a severity of 6/10. The pain is moderate. The pain has been intermittent since onset. Pertinent negatives include no loss of motion, numbness or tingling. He reports no foreign bodies present. The symptoms are aggravated by weight bearing. He has tried ice, rest and acetaminophen for the symptoms. The treatment provided mild relief.      Review of Systems  Neurological: Negative for tingling and numbness.  All other systems reviewed and are negative.      Objective:   Physical Exam  Constitutional: He is oriented to person, place, and time. He appears well-developed and well-nourished. No distress.  morbid obese   HENT:  Head: Normocephalic.  Eyes: Pupils are equal, round, and reactive to light. Right eye exhibits no discharge. Left eye exhibits no discharge.  Cardiovascular: Normal rate, regular rhythm, normal heart sounds and intact distal pulses.   No murmur heard. Pulmonary/Chest: Effort normal and breath sounds normal. No respiratory distress. He has no wheezes.  Abdominal: Soft. Bowel sounds are normal. He exhibits no distension. There is tenderness. There is rebound.  Musculoskeletal: Normal range of motion. He exhibits no edema or tenderness.  Decrease ROM of rotating right foot related to pain   Neurological: He is alert and oriented to person, place, and time.  Skin: Skin is warm and dry. No rash noted. No erythema.  Psychiatric: He has a normal mood and affect. His behavior is normal. Judgment and thought content normal.  Vitals reviewed.  Right ankle- WNL Preliminary reading by Jannifer Rodneyhristy Deiona Hooper, FNP [facility]   BP  123/82 mmHg  Pulse 94  Temp(Src) 97.7 F (36.5 C) (Oral)  Ht 5\' 8"  (1.727 m)  Wt 361 lb 3.2 oz (163.839 kg)  BMI 54.93 kg/m2      Assessment & Plan:  1. Pain in right ankle - DG Ankle Complete Right; Future  2. Right ankle sprain, initial encounter -Rest -Ice and heat as needed compression -Elevation -ROM exercises encouraged -RTO prn - naproxen (NAPROSYN) 500 MG tablet; Take 1 tablet (500 mg total) by mouth 2 (two) times daily with a meal.  Dispense: 60 tablet; Refill: 1  Jannifer Rodneyhristy Cordarro Spinnato, FNP

## 2015-10-08 NOTE — Patient Instructions (Signed)
Ankle Sprain  An ankle sprain is an injury to the strong, fibrous tissues (ligaments) that hold the bones of your ankle joint together.   CAUSES  An ankle sprain is usually caused by a fall or by twisting your ankle. Ankle sprains most commonly occur when you step on the outer edge of your foot, and your ankle turns inward. People who participate in sports are more prone to these types of injuries.   SYMPTOMS    Pain in your ankle. The pain may be present at rest or only when you are trying to stand or walk.   Swelling.   Bruising. Bruising may develop immediately or within 1 to 2 days after your injury.   Difficulty standing or walking, particularly when turning corners or changing directions.  DIAGNOSIS   Your caregiver will ask you details about your injury and perform a physical exam of your ankle to determine if you have an ankle sprain. During the physical exam, your caregiver will press on and apply pressure to specific areas of your foot and ankle. Your caregiver will try to move your ankle in certain ways. An X-ray exam may be done to be sure a bone was not broken or a ligament did not separate from one of the bones in your ankle (avulsion fracture).   TREATMENT   Certain types of braces can help stabilize your ankle. Your caregiver can make a recommendation for this. Your caregiver may recommend the use of medicine for pain. If your sprain is severe, your caregiver may refer you to a surgeon who helps to restore function to parts of your skeletal system (orthopedist) or a physical therapist.  HOME CARE INSTRUCTIONS    Apply ice to your injury for 1-2 days or as directed by your caregiver. Applying ice helps to reduce inflammation and pain.    Put ice in a plastic bag.    Place a towel between your skin and the bag.    Leave the ice on for 15-20 minutes at a time, every 2 hours while you are awake.   Only take over-the-counter or prescription medicines for pain, discomfort, or fever as directed by  your caregiver.   Elevate your injured ankle above the level of your heart as much as possible for 2-3 days.   If your caregiver recommends crutches, use them as instructed. Gradually put weight on the affected ankle. Continue to use crutches or a cane until you can walk without feeling pain in your ankle.   If you have a plaster splint, wear the splint as directed by your caregiver. Do not rest it on anything harder than a pillow for the first 24 hours. Do not put weight on it. Do not get it wet. You may take it off to take a shower or bath.   You may have been given an elastic bandage to wear around your ankle to provide support. If the elastic bandage is too tight (you have numbness or tingling in your foot or your foot becomes cold and blue), adjust the bandage to make it comfortable.   If you have an air splint, you may blow more air into it or let air out to make it more comfortable. You may take your splint off at night and before taking a shower or bath. Wiggle your toes in the splint several times per day to decrease swelling.  SEEK MEDICAL CARE IF:    You have rapidly increasing bruising or swelling.   Your toes feel   extremely cold or you lose feeling in your foot.   Your pain is not relieved with medicine.  SEEK IMMEDIATE MEDICAL CARE IF:   Your toes are numb or blue.   You have severe pain that is increasing.  MAKE SURE YOU:    Understand these instructions.   Will watch your condition.   Will get help right away if you are not doing well or get worse.     This information is not intended to replace advice given to you by your health care provider. Make sure you discuss any questions you have with your health care provider.     Document Released: 04/14/2005 Document Revised: 05/05/2014 Document Reviewed: 04/26/2011  [employer]vier Interactive Patient Education 2016 [employer]

## 2015-10-16 ENCOUNTER — Encounter: Payer: Self-pay | Admitting: Pharmacist

## 2015-10-16 ENCOUNTER — Ambulatory Visit (INDEPENDENT_AMBULATORY_CARE_PROVIDER_SITE_OTHER): Payer: Medicare Other | Admitting: Pharmacist

## 2015-10-16 DIAGNOSIS — F329 Major depressive disorder, single episode, unspecified: Secondary | ICD-10-CM

## 2015-10-16 DIAGNOSIS — E8881 Metabolic syndrome: Secondary | ICD-10-CM

## 2015-10-16 DIAGNOSIS — F32A Depression, unspecified: Secondary | ICD-10-CM

## 2015-10-16 MED ORDER — BUPROPION HCL ER (XL) 150 MG PO TB24: 150.0000 mg | ORAL_TABLET | ORAL | Status: DC

## 2015-10-16 NOTE — Progress Notes (Signed)
Subjective:     Justin Schmidt) is a 35 y.o. male here for reevaluation of weight loss/obesity.  He has taken phentermine 37.5mg  1 tablet daily in the past and lost about 20# but has not taken in the last 9 months.  Since October 2016 he has gained about 33#.     Patient has recently tried a [employer] - fish, blueberries, oatmeal and water with little success.  Though he reports that yesterday he went to [facility] has a large cherry coke, double hamburger, large FF.    He feels ideal weight is 225 pounds. History of eating disorders: none. There is a family history positive for obesity in the patient, father and brother. Previous treatments for obesity include self-directed dieting and  Phentermine.  Obesity associated medical conditions: depression, hyperlipidemia and pre diabetes (A1c was 5.7%). Obesity associated medications: none. Cardiovascular risk factors besides obesity: dyslipidemia, male gender and obesity (BMI >= 30 kg/m2).  Exercise - lifts weights but not as much lately because the gym he use to frequent has closed.   Justin Schmidt lives with his grandfather who is in his 81's and has dementia.  His grandfather cannot be left alone and Justin Schmidt is the main caretaker.  He has not been getting out as much lately or interacting with friends.  Last Depression screening score was 13 and today was 9.  Patient is not taking any medications for depression.  Depression screen [facility] 2/9 10/16/2015 10/08/2015 05/16/2015 03/20/2015 09/29/2014  Decreased Interest 0  Down, Depressed, Hopeless 0  PHQ - 2 Score 0  Altered sleeping -  Tired, decreased energy -  Change in appetite -  Feeling bad or failure about yourself  0 0 3 3 -  Trouble concentrating 0 -  Moving slowly or fidgety/restless 0 -  Suicidal thoughts 0 3 0 0 -  PHQ-9 Score -  Difficult doing work/chores Very difficult Very difficult Somewhat difficult Very difficult -      The following portions of the patient's history were reviewed and updated as appropriate: allergies, current medications, past family history, past medical history, past social history, past surgical history and problem list.   Objective:    Body mass index is 55.82 kg/(m^2).   Filed Vitals:   10/16/15 1419  BP: 122/80  Pulse: 88    Filed Weights   10/16/15 1419  Weight: 367 lb (166.47 kg)    Assessment:    Obesity. I assessed Justin Schmidt to be in an action stage with respect to weight loss.  Metabolic syndrome with family history of diabetes / pre diabetes Depression   Plan:      1.General weight loss/lifestyle modification strategies discussed (elicit support from others; identify saboteurs; non-food rewards, etc).  Increase non-starchy vegetables - carrots, green bean, squash, zucchini, tomatoes, onions, peppers, spinach and other green leafy vegetables, cabbage, lettuce, cucumbers, asparagus, okra (not fried), eggplant Limit sugar and processed foods (cakes, cookies, ice cream, crackers and chips) Increase fresh fruit but limit serving sizes 1/2 cup or about the size of tennis or baseball Limit red meat to no more than 1-2 times per week (serving size about the size of your palm) Choose whole grains / lean proteins - whole wheat bread, quinoa, whole grain rice (1/2 cup), fish, chicken, [place] Avoid sugar and calorie containing  beverages - soda, sweet tea and juice.  Choose water or unsweetened tea instead.  2.  Discussed Contrave for weight loss but since he takes tylenol #3 occasionally for pain this is not good choice.  We did start buproprion XL 150mg  which is part of Contrave.  Buproprion should help with both food cravings and depression 3.  Encouraged Justin Rumpfolin to talk with his grandfather's PCP and family to see if there is an agency that can help provide in home nursing assistance from time to time to relieve him of some of the caretaking responsibilities.  4..  Discussed some options for increasing physical activity again - recommended mix of weight lifting and cardio as long as ankle is not hurting.  5.  Discussed bariatric surgeries and show patient website to [facility] to get more information.  RTC in 4 weeks   Henrene Pastorammy Charnice Zwilling, PharmD, CPP, CDE

## 2015-10-23 ENCOUNTER — Telehealth: Payer: Self-pay | Admitting: Pharmacist

## 2015-10-23 NOTE — Telephone Encounter (Signed)
bupropion / Wellbutrin can be taken with or without food.

## 2015-10-23 NOTE — Telephone Encounter (Signed)
Pt aware.

## 2015-11-19 ENCOUNTER — Encounter: Payer: Self-pay | Admitting: Pharmacist

## 2015-11-19 ENCOUNTER — Ambulatory Visit (INDEPENDENT_AMBULATORY_CARE_PROVIDER_SITE_OTHER): Payer: Medicare Other | Admitting: Pharmacist

## 2015-11-19 VITALS — BP 128/70 | HR 88 | Ht 68.0 in | Wt 364.5 lb

## 2015-11-19 DIAGNOSIS — E785 Hyperlipidemia, unspecified: Secondary | ICD-10-CM

## 2015-11-19 DIAGNOSIS — R7303 Prediabetes: Secondary | ICD-10-CM | POA: Diagnosis not present

## 2015-11-19 DIAGNOSIS — Z0000 Encounter for general adult medical examination without abnormal findings: Secondary | ICD-10-CM

## 2015-11-19 LAB — BAYER DCA HB A1C WAIVED: HB A1C (BAYER DCA - WAIVED): 5.8 % (ref <7.0)

## 2015-11-19 NOTE — Patient Instructions (Addendum)
Justin Schmidt , Thank you for taking time to come for your Medicare Wellness Visit. I appreciate your ongoing commitment to your health goals. Please review the following plan we discussed and let me know if I can assist you in the future.   These are the goals we discussed:  Try to restart exercise - walking, treadmill, weight lifting - goal is 150 minutes each week.  Increase non-starchy vegetables - carrots, green bean, squash, zucchini, tomatoes, onions, peppers, spinach and other green leafy vegetables, cabbage, lettuce, cucumbers, asparagus, okra (not fried), eggplant Limit sugar and processed foods (cakes, cookies, ice cream, crackers and chips) Increase fresh fruit but limit serving sizes 1/2 cup or about the size of tennis or baseball Limit red meat to no more than 1-2 times per week (serving size about the size of your palm) Choose whole grains / lean proteins - whole wheat bread, quinoa, whole grain rice (1/2 cup), fish, chicken, [place] Avoid sugar and calorie containing beverages - soda, sweet tea and juice.  Choose water or unsweetened tea instead.   This is a list of the screening recommended for you and due dates:  Health Maintenance  Topic Date Due  . HIV Screening  Done today  . Tetanus Vaccine  04/09/2015 - checking into cost  . Flu Shot  11/27/2015    Health Maintenance, Male A healthy lifestyle and preventative care can promote health and wellness.  Maintain regular health, dental, and eye exams.  Eat a healthy diet. Foods like vegetables, fruits, whole grains, low-fat dairy products, and lean protein foods contain the nutrients you need and are low in calories. Decrease your intake of foods high in solid fats, added sugars, and salt. Get information about a proper diet from your health care provider, if necessary.  Regular physical exercise is one of the most important things you can do for your health. Most adults should get at least 150 minutes of moderate-intensity  exercise (any activity that increases your heart rate and causes you to sweat) each week. In addition, most adults need muscle-strengthening exercises on 2 or more days a week.   Maintain a healthy weight. The body mass index (BMI) is a screening tool to identify possible weight problems. It provides an estimate of body fat based on height and weight. Your health care provider can find your BMI and can help you achieve or maintain a healthy weight. For males 20 years and older:  A BMI below 18.5 is considered underweight.  A BMI of 18.5 to 24.9 is normal.  A BMI of 25 to 29.9 is considered overweight.  A BMI of 30 and above is considered obese.  Maintain normal blood lipids and cholesterol by exercising and minimizing your intake of saturated fat. Eat a balanced diet with plenty of fruits and vegetables. Blood tests for lipids and cholesterol should begin at age 86 and be repeated every 5 years. If your lipid or cholesterol levels are high, you are over age 71, or you are at high risk for heart disease, you may need your cholesterol levels checked more frequently.Ongoing high lipid and cholesterol levels should be treated with medicines if diet and exercise are not working.  If you smoke, find out from your health care provider how to quit. If you do not use tobacco, do not start.  Lung cancer screening is recommended for adults aged 55-80 years who are at high risk for developing lung cancer because of a history of smoking. A yearly low-dose CT scan  of the lungs is recommended for people who have at least a 30-pack-year history of smoking and are current smokers or have quit within the past 15 years. A pack year of smoking is smoking an average of 1 pack of cigarettes a day for 1 year (for example, a 30-pack-year history of smoking could mean smoking 1 pack a day for 30 years or 2 packs a day for 15 years). Yearly screening should continue until the smoker has stopped smoking for at least 15  years. Yearly screening should be stopped for people who develop a health problem that would prevent them from having lung cancer treatment.  If you choose to drink alcohol, do not have more than 2 drinks per day. One drink is considered to be 12 oz (360 mL) of beer, 5 oz (150 mL) of wine, or 1.5 oz (45 mL) of liquor.  Avoid the use of street drugs. Do not share needles with anyone. Ask for help if you need support or instructions about stopping the use of drugs.  High blood pressure causes heart disease and increases the risk of stroke. High blood pressure is more likely to develop in:  People who have blood pressure in the end of the normal range (100-139/85-89 mm Hg).  People who are overweight or obese.  People who are African American.  If you are 50-58 years of age, have your blood pressure checked every 3-5 years. If you are 84 years of age or older, have your blood pressure checked every year. You should have your blood pressure measured twice--once when you are at a hospital or clinic, and once when you are not at a hospital or clinic. Record the average of the two measurements. To check your blood pressure when you are not at a hospital or clinic, you can use:  An automated blood pressure machine at a pharmacy.  A home blood pressure monitor.  If you are 46-48 years old, ask your health care provider if you should take aspirin to prevent heart disease.  Diabetes screening involves taking a blood sample to check your fasting blood sugar level. This should be done once every 3 years after age 57 if you are at a normal weight and without risk factors for diabetes. Testing should be considered at a younger age or be carried out more frequently if you are overweight and have at least 1 risk factor for diabetes.  Colorectal cancer can be detected and often prevented. Most routine colorectal cancer screening begins at the age of 84 and continues through age 101. However, your health care  provider may recommend screening at an earlier age if you have risk factors for colon cancer. On a yearly basis, your health care provider may provide home test kits to check for hidden blood in the stool. A small camera at the end of a tube may be used to directly examine the colon (sigmoidoscopy or colonoscopy) to detect the earliest forms of colorectal cancer. Talk to your health care provider about this at age 30 when routine screening begins. A direct exam of the colon should be repeated every 5-10 years through age 53, unless early forms of precancerous polyps or small growths are found.  People who are at an increased risk for hepatitis B should be screened for this virus. You are considered at high risk for hepatitis B if:  You were born in a country where hepatitis B occurs often. Talk with your health care provider about which countries are considered  high risk.  Your parents were born in a high-risk country and you have not received a shot to protect against hepatitis B (hepatitis B vaccine).  You have HIV or AIDS.  You use needles to inject street drugs.  You live with, or have sex with, someone who has hepatitis B.  You are a man who has sex with other men (MSM).  You get hemodialysis treatment.  You take certain medicines for conditions like cancer, organ transplantation, and autoimmune conditions.  Hepatitis C blood testing is recommended for all people born from [birth year] through 1966 and any individual with known risk factors for hepatitis C.  Healthy men should no longer receive prostate-specific antigen (PSA) blood tests as part of routine cancer screening. Talk to your health care provider about prostate cancer screening.  Testicular cancer screening is not recommended for adolescents or adult males who have no symptoms. Screening includes self-exam, a health care provider exam, and other screening tests. Consult with your health care provider about any symptoms you have or any  concerns you have about testicular cancer.  Practice safe sex. Use condoms and avoid high-risk sexual practices to reduce the spread of sexually transmitted infections (STIs).  You should be screened for STIs, including gonorrhea and chlamydia if:  You are sexually active and are younger than 24 years.  You are older than 24 years, and your health care provider tells you that you are at risk for this type of infection.  Your sexual activity has changed since you were last screened, and you are at an increased risk for chlamydia or gonorrhea. Ask your health care provider if you are at risk.  If you are at risk of being infected with HIV, it is recommended that you take a prescription medicine daily to prevent HIV infection. This is called pre-exposure prophylaxis (PrEP). You are considered at risk if:  You are a man who has sex with other men (MSM).  You are a heterosexual man who is sexually active with multiple partners.  You take drugs by injection.  You are sexually active with a partner who has HIV.  Talk with your health care provider about whether you are at high risk of being infected with HIV. If you choose to begin PrEP, you should first be tested for HIV. You should then be tested every 3 months for as long as you are taking PrEP.  Use sunscreen. Apply sunscreen liberally and repeatedly throughout the day. You should seek shade when your shadow is shorter than you. Protect yourself by wearing long sleeves, pants, a wide-brimmed hat, and sunglasses year round whenever you are outdoors.  Tell your health care provider of new moles or changes in moles, especially if there is a change in shape or color. Also, tell your health care provider if a mole is larger than the size of a pencil eraser.  A one-time screening for abdominal aortic aneurysm (AAA) and surgical repair of large AAAs by ultrasound is recommended for men aged 65-75 years who are current or former smokers.  Stay  current with your vaccines (immunizations).   This information is not intended to replace advice given to you by your health care provider. Make sure you discuss any questions you have with your health care provider.   Document Released: 10/11/2007 Document Revised: 05/05/2014 Document Reviewed: 09/09/2010 Elsevier Interactive Patient Education [employer]

## 2015-11-19 NOTE — Progress Notes (Signed)
Subjective:   Justin Schmidt is a 35 y.o. male who presents for a subsequent Medicare Annual Wellness Visit.  Justin Schmidt is disabled.  Lives with and cares for his grandfather who has Alzhiemers  Review of Systems  Review of Systems  Constitutional: Positive for weight loss (intended).  HENT: Negative.   Eyes: Negative.   Respiratory: Negative.   Cardiovascular: Negative.   Gastrointestinal: Negative.   Genitourinary: Negative.   Musculoskeletal: Positive for back pain.  Skin: Negative.   Neurological: Negative.   Endo/Heme/Allergies: Negative.   Psychiatric/Behavioral: Positive for depression.       Current Medications (verified) Outpatient Encounter Prescriptions as of 11/19/2015  Medication Sig  . acetaminophen-codeine (TYLENOL #3) 300-30 MG per tablet Take 1 tablet by mouth every 4 (four) hours as needed for moderate pain.  . Arginine 500 MG CAPS Take 1 capsule by mouth daily.  . Ascorbic Acid (VITAMIN C) 1000 MG tablet Take 1,000 mg by mouth daily.  Marland Kitchen buPROPion (WELLBUTRIN XL) 150 MG 24 hr tablet Take 1 tablet (150 mg total) by mouth every morning.  . Cholecalciferol (VITAMIN D) 2000 UNITS tablet Take 2,000 Units by mouth daily.  . Coenzyme Q10 100 MG TABS Take 1 tablet (100 mg total) by mouth daily.  . Flaxseed, Linseed, (FLAX SEED OIL PO) Take 1,200 mg by mouth daily.  . fluticasone (FLONASE) 50 MCG/ACT nasal spray Place 2 sprays into both nostrils daily.  . furosemide (LASIX) 20 MG tablet Take 1 tablet (20 mg total) by mouth daily as needed.  . Melatonin 1 MG TABS Take 2-3 tablets (2-3 mg total) by mouth every evening.  . Misc Natural Products (GREEN TEA) TABS Take 315 mg by mouth daily.  . Multiple Vitamin (MULTIVITAMIN WITH MINERALS) TABS Take 1 tablet by mouth daily.  . mupirocin ointment (BACTROBAN) 2 % Reported on 05/16/2015  . naproxen (NAPROSYN) 500 MG tablet Take 1 tablet (500 mg total) by mouth 2 (two) times daily with a meal.  . Nutritional Supplements (DHEA PO)  Take 50 mg by mouth daily. On days after workout  . oxaprozin (DAYPRO) 600 MG tablet Take 600 mg by mouth daily as needed.  . TURMERIC CURCUMIN PO Take 1 capsule by mouth daily.   Facility-Administered Encounter Medications as of 11/19/2015  Medication  . sodium chloride irrigation 0.9 %    Allergies (verified) Review of patient's allergies indicates no known allergies.   History: Past Medical History:  Diagnosis Date  . Back pain   . Gallstones   . Hyperlipidemia   . Pre-diabetes    Past Surgical History:  Procedure Laterality Date  . APPENDECTOMY  1991  . CHOLECYSTECTOMY  01/23/2012   Procedure: LAPAROSCOPIC CHOLECYSTECTOMY;  Surgeon: Donato Heinz, MD;  Location: [facility];  Service: General;  Laterality: N/A;  . TONSILLECTOMY     Family History  Problem Relation Age of Onset  . Diabetes Mother   . Diabetes Father   . Hypertension Father   . Hyperlipidemia Father   . Migraines Sister   . Diabetes Maternal Uncle   . Diabetes Maternal Grandmother   . Diabetes Other    Social History   Occupational History  . Not on file.   Social History Main Topics  . Smoking status: Never Smoker  . Smokeless tobacco: Never Used  . Alcohol use 0.6 oz/week    1 Cans of beer per week  . Drug use: No  . Sexual activity: No    Do you feel safe at home?  Yes Are there smokers in your home (other than you)? No  Dietary issues and exercise activities discussed: Current Exercise Habits: Home exercise routine (His gym closed and has not joined another gym yet.), Type of exercise: strength training/weights, Time (Minutes): 20, Frequency (Times/Week): 1, Weekly Exercise (Minutes/Week): 20, Intensity: Moderate  Current Dietary habits:  Patient is trying to eat more salads and low calorie foods.  Sometimes has protein shake for meal repalcement.  Snacks are celery and peanut butter.   Has not eaten as much red meat or fish lately    Cardiac Risk Factors include: dyslipidemia;family  history of premature cardiovascular disease;male gender;obesity (BMI >30kg/m2);sedentary lifestyle  Objective:    Today's Vitals   11/19/15 1439  BP: 128/70  Pulse: 88  Weight: (!) 364 lb 8 oz (165.3 kg)  Height: '5\' 8"'  (1.727 m)  PainSc: 4   PainLoc: Back   Body mass index is 55.42 kg/m.   Activities of Daily Living In your present state of health, do you have any difficulty performing the following activities: 11/19/2015  Hearing? N  Vision? N  Difficulty concentrating or making decisions? N  Walking or climbing stairs? N  Dressing or bathing? N  Doing errands, shopping? N  Preparing Food and eating ? N  Using the Toilet? N  In the past six months, have you accidently leaked urine? N  Do you have problems with loss of bowel control? N  Managing your Medications? N  Managing your Finances? N  Housekeeping or managing your Housekeeping? N  Some recent data might be hidden     Depression Screen PHQ 2/9 Scores 11/19/2015 10/16/2015 10/08/2015 05/16/2015  PHQ - 2 Score '2 3 2 4  ' PHQ- 9 Score '3 9 13 18     ' Fall Risk Fall Risk  11/19/2015 10/16/2015 05/16/2015 09/29/2014 03/09/2014  Falls in the past year? Yes No No No No  Number falls in past yr: 1 - - - -  Injury with Fall? No - - - -    Cognitive Function: MMSE - Mini Mental State Exam 11/19/2015 09/29/2014  Orientation to time 5 5  Orientation to Place 5 5  Registration 3 3  Attention/ Calculation 5 5  Recall 3 3  Language- name 2 objects 2 2  Language- repeat 1 1  Language- follow 3 step command 3 3  Language- read & follow direction 1 1  Write a sentence 1 1  Copy design 1 1  Total score 30 30    Immunizations and Health Maintenance Immunization History  Administered Date(s) Administered  . Influenza,inj,Quad PF,36+ Mos 02/01/2015  . Td 04/08/2005   Health Maintenance Due  Topic Date Due  . HIV Screening  02/12/1995  . TETANUS/TDAP  04/09/2015    Patient Care Team: Wardell Honour, MD as PCP - General  (Family Medicine)  Indicate any recent Medical Services you may have received from other than [facility] providers in the past year (date may be approximate).    Assessment:    Annual Wellness Visit, subsequent Obesity - lost 2.5# since last visit Prediabetes - stable     Screening Tests Health Maintenance  Topic Date Due  . HIV Screening  02/12/1995  . TETANUS/TDAP  04/09/2015  . INFLUENZA VACCINE  11/27/2015        Plan:   During the course of the visit Justin Schmidt was educated and counseled about the following appropriate screening and preventive services:   Vaccines to include Pneumoccal, Influenza,  Td - patient is  due Boostrix.  Unable to verify insurance coverage while in office.  Later verified would be $3.70 - patient notified and will get with next appt  Colorectal cancer screening - not recommended yet  Cardiovascular disease screening - BP at goal today  Checking Lipids - pending  Diabetes screening - checked A1c today - stable / prediabetes  Nutrition counseling - Discussed limiting caloric intake.  Spent 15 minutes discussing healthier options when eating out.   Prostate cancer screening - not yet indicated  Advanced Directives - information provided  Physical Activity - restart exercise - recommend combo of aerobic activity and weight lifting  Continue buproprion XL 124m 1 tablet daily  Follow up with PCP in 6 weeks - appt made.   Follow up with me in 12 weeks - appt made.   Orders Placed This Encounter  Procedures  . Bayer DCA Hb A1c Waived  . CMP14+EGFR  . Lipid panel  . HIV antibody     Patient Instructions (the written plan) were given to the patient.   ECherre Robins PharmD   11/19/2015

## 2015-11-20 LAB — CMP14+EGFR
ALBUMIN: 4.3 g/dL (ref 3.5–5.5)
ALK PHOS: 59 IU/L (ref 39–117)
ALT: 53 IU/L — ABNORMAL HIGH (ref 0–44)
AST: 29 IU/L (ref 0–40)
Albumin/Globulin Ratio: 1.7 (ref 1.2–2.2)
BILIRUBIN TOTAL: 0.3 mg/dL (ref 0.0–1.2)
BUN / CREAT RATIO: 12 (ref 9–20)
BUN: 13 mg/dL (ref 6–20)
CHLORIDE: 103 mmol/L (ref 96–106)
CO2: 23 mmol/L (ref 18–29)
Calcium: 9.2 mg/dL (ref 8.7–10.2)
Creatinine, Ser: 1.12 mg/dL (ref 0.76–1.27)
GFR calc Af Amer: 98 mL/min/{1.73_m2} (ref >59)
GFR calc non Af Amer: 85 mL/min/{1.73_m2} (ref >59)
GLOBULIN, TOTAL: 2.6 g/dL (ref 1.5–4.5)
GLUCOSE: 95 mg/dL (ref 65–99)
POTASSIUM: 4.4 mmol/L (ref 3.5–5.2)
Sodium: 142 mmol/L (ref 134–144)
Total Protein: 6.9 g/dL (ref 6.0–8.5)

## 2015-11-20 LAB — LIPID PANEL
CHOLESTEROL TOTAL: 172 mg/dL (ref 100–199)
Chol/HDL Ratio: 3.9 ratio units (ref 0.0–5.0)
HDL: 44 mg/dL (ref >39)
LDL Calculated: 105 mg/dL — ABNORMAL HIGH (ref 0–99)
Triglycerides: 113 mg/dL (ref 0–149)
VLDL Cholesterol Cal: 23 mg/dL (ref 5–40)

## 2015-11-20 LAB — HIV ANTIBODY (ROUTINE TESTING W REFLEX): HIV Screen 4th Generation wRfx: NONREACTIVE

## 2015-11-21 ENCOUNTER — Ambulatory Visit (INDEPENDENT_AMBULATORY_CARE_PROVIDER_SITE_OTHER): Payer: Medicare Other | Admitting: Family Medicine

## 2015-11-21 ENCOUNTER — Encounter: Payer: Self-pay | Admitting: Family Medicine

## 2015-11-21 VITALS — BP 130/78 | HR 76 | Temp 97.8°F | Ht 68.0 in | Wt 359.6 lb

## 2015-11-21 DIAGNOSIS — R609 Edema, unspecified: Secondary | ICD-10-CM

## 2015-11-21 MED ORDER — FUROSEMIDE 20 MG PO TABS: 20.0000 mg | ORAL_TABLET | Freq: Every day | ORAL | 3 refills | Status: DC | PRN

## 2015-11-21 NOTE — Progress Notes (Signed)
BP 130/78 (BP Location: Left Arm, Patient Position: Sitting, Cuff Size: Large)   Pulse 76   Temp 97.8 F (36.6 C) (Oral)   Ht '5\' 8"'  (1.727 m)   Wt (!) 359 lb 9.6 oz (163.1 kg)   BMI 54.68 kg/m    Subjective:    Patient ID: Justin Schmidt, male    DOB: 1980/07/11, 35 y.o.   MRN: [id number]  HPI: Justin Schmidt is a 35 y.o. male presenting on 11/21/2015 for Foot Pain (left, started yesterday morning, no known injury, swollen, no redness)   HPI Swelling and pain in the left leg more than right Patient has been having swelling and pain in his left leg but also he is swelling in his right leg. The pain in his left leg is more in his anterior ankle and it feels like a tight stretching pressure. He denies any injury that he knows of. He has had this same pain a few months ago when he was really swollen as well. He denies any fevers or chills or redness or warmth.  Relevant past medical, surgical, family and social history reviewed and updated as indicated. Interim medical history since our last visit reviewed. Allergies and medications reviewed and updated.  Review of Systems  Constitutional: Negative for fever.  HENT: Negative for ear discharge and ear pain.   Eyes: Negative for discharge and visual disturbance.  Respiratory: Negative for shortness of breath and wheezing.   Cardiovascular: Positive for leg swelling. Negative for chest pain and palpitations.  Gastrointestinal: Negative for abdominal pain, constipation and diarrhea.  Genitourinary: Negative for difficulty urinating.  Musculoskeletal: Positive for arthralgias. Negative for back pain and gait problem.  Skin: Negative for rash.  Neurological: Negative for syncope, light-headedness and headaches.  All other systems reviewed and are negative.   Per HPI unless specifically indicated above     Medication List       Accurate as of 11/21/15  5:57 PM. Always use your most recent med list.          acetaminophen-codeine  300-30 MG tablet Commonly known as:  TYLENOL #3 Take 1 tablet by mouth every 4 (four) hours as needed for moderate pain.   Arginine 500 MG Caps Take 1 capsule by mouth daily.   buPROPion 150 MG 24 hr tablet Commonly known as:  WELLBUTRIN XL Take 1 tablet (150 mg total) by mouth every morning.   Coenzyme Q10 100 MG Tabs Take 1 tablet (100 mg total) by mouth daily.   DHEA PO Take 50 mg by mouth daily. On days after workout   FLAX SEED OIL PO Take 1,200 mg by mouth daily.   fluticasone 50 MCG/ACT nasal spray Commonly known as:  FLONASE Place 2 sprays into both nostrils daily.   furosemide 20 MG tablet Commonly known as:  LASIX Take 1 tablet (20 mg total) by mouth daily as needed.   Green Tea Tabs Take 315 mg by mouth daily.   Melatonin 1 MG Tabs Take 2-3 tablets (2-3 mg total) by mouth every evening.   multivitamin with minerals Tabs tablet Take 1 tablet by mouth daily.   mupirocin ointment 2 % Commonly known as:  BACTROBAN Reported on 05/16/2015   naproxen 500 MG tablet Commonly known as:  NAPROSYN Take 1 tablet (500 mg total) by mouth 2 (two) times daily with a meal.   oxaprozin 600 MG tablet Commonly known as:  DAYPRO Take 600 mg by mouth daily as needed.   TURMERIC CURCUMIN  PO Take 1 capsule by mouth daily.   vitamin C 1000 MG tablet Take 1,000 mg by mouth daily.   Vitamin D 2000 units tablet Take 2,000 Units by mouth daily.          Objective:    BP 130/78 (BP Location: Left Arm, Patient Position: Sitting, Cuff Size: Large)   Pulse 76   Temp 97.8 F (36.6 C) (Oral)   Ht '5\' 8"'  (1.727 m)   Wt (!) 359 lb 9.6 oz (163.1 kg)   BMI 54.68 kg/m   Wt Readings from Last 3 Encounters:  11/21/15 (!) 359 lb 9.6 oz (163.1 kg)  11/19/15 (!) 364 lb 8 oz (165.3 kg)  10/16/15 (!) 367 lb (166.5 kg)    Physical Exam  Constitutional: He is oriented to person, place, and time. He appears well-developed and well-nourished. No distress.  Eyes: Conjunctivae and  EOM are normal. Pupils are equal, round, and reactive to light. Right eye exhibits no discharge. No scleral icterus.  Neck: Neck supple. No thyromegaly present.  Cardiovascular: Normal rate, regular rhythm, normal heart sounds and intact distal pulses.   No murmur heard. Pulmonary/Chest: Effort normal and breath sounds normal. No respiratory distress. He has no wheezes.  Musculoskeletal: Normal range of motion. He exhibits edema (2+ pitting edema in bilateral lower extremity. Painful on anterior left ankle with pitting. No pain with ankle range of motion).  Lymphadenopathy:    He has no cervical adenopathy.  Neurological: He is alert and oriented to person, place, and time. Coordination normal.  Skin: Skin is warm and dry. No rash noted. He is not diaphoretic.  Psychiatric: He has a normal mood and affect. His behavior is normal.  Nursing note and vitals reviewed.   Results for orders placed or performed in visit on 11/19/15  Bayer DCA Hb A1c Waived  Result Value Ref Range   Bayer DCA Hb A1c Waived 5.8 <7.0 %  CMP14+EGFR  Result Value Ref Range   Glucose 95 65 - 99 mg/dL   BUN 13 6 - 20 mg/dL   Creatinine, Ser 1.12 0.76 - 1.27 mg/dL   GFR calc non Af Amer 85 >59 mL/min/1.73   GFR calc Af Amer 98 >59 mL/min/1.73   BUN/Creatinine Ratio 12 9 - 20   Sodium 142 134 - 144 mmol/L   Potassium 4.4 3.5 - 5.2 mmol/L   Chloride 103 96 - 106 mmol/L   CO2 23 18 - 29 mmol/L   Calcium 9.2 8.7 - 10.2 mg/dL   Total Protein 6.9 6.0 - 8.5 g/dL   Albumin 4.3 3.5 - 5.5 g/dL   Globulin, Total 2.6 1.5 - 4.5 g/dL   Albumin/Globulin Ratio 1.7 1.2 - 2.2   Bilirubin Total 0.3 0.0 - 1.2 mg/dL   Alkaline Phosphatase 59 39 - 117 IU/L   AST 29 0 - 40 IU/L   ALT 53 (H) 0 - 44 IU/L  Lipid panel  Result Value Ref Range   Cholesterol, Total 172 100 - 199 mg/dL   Triglycerides 113 0 - 149 mg/dL   HDL 44 >39 mg/dL   VLDL Cholesterol Cal 23 5 - 40 mg/dL   LDL Calculated 105 (H) 0 - 99 mg/dL   Chol/HDL Ratio  3.9 0.0 - 5.0 ratio units  HIV antibody  Result Value Ref Range   HIV Screen 4th Generation wRfx Non Reactive Non Reactive      Assessment & Plan:   Problem List Items Addressed This Visit    None  Visit Diagnoses    Peripheral edema    -  Primary   Ace wraps and Lasix as needed. Elevation as needed as well. Pain is likely due to stretching of the skin in the legs.   Relevant Medications   furosemide (LASIX) 20 MG tablet       Follow up plan: Return if symptoms worsen or fail to improve.  Counseling provided for all of the vaccine components No orders of the defined types were placed in this encounter.   Caryl Pina, MD [facility] 11/21/2015, 5:57 PM

## 2016-01-02 ENCOUNTER — Ambulatory Visit: Payer: Self-pay | Admitting: Pediatrics

## 2016-01-04 ENCOUNTER — Ambulatory Visit (INDEPENDENT_AMBULATORY_CARE_PROVIDER_SITE_OTHER): Payer: Medicare Other | Admitting: Pediatrics

## 2016-01-04 ENCOUNTER — Encounter: Payer: Self-pay | Admitting: Pediatrics

## 2016-01-04 VITALS — BP 131/87 | HR 83 | Temp 98.1°F | Ht 68.0 in | Wt 362.2 lb

## 2016-01-04 DIAGNOSIS — K760 Fatty (change of) liver, not elsewhere classified: Secondary | ICD-10-CM

## 2016-01-04 DIAGNOSIS — F4323 Adjustment disorder with mixed anxiety and depressed mood: Secondary | ICD-10-CM

## 2016-01-04 DIAGNOSIS — E8881 Metabolic syndrome: Secondary | ICD-10-CM | POA: Diagnosis not present

## 2016-01-04 NOTE — Progress Notes (Signed)
  Subjective:   Patient ID: Justin Schmidt, male    DOB: Jul 12, 1980, 35 y.o.   MRN: [id number] CC: Follow-up  HPI: Justin Schmidt is a 35 y.o. male presenting for Follow-up  Feeling better overall Mood is up Trying to walk regularly Regularly working at the farm Trying to eat less, not having seconds as often Avoiding soft drinks Decreased fast food Still eats some T,R,Sat weight lifting M,W,F other cardio activities   Mood has been ok Still frustrated with family Taking care of MGF still at home  Has had some lower back pain off and on When back exerciseis or his work outs hurt his back he stops    [facility] 01/04/2016 11/21/2015 11/19/2015 10/16/2015 10/08/2015  Decreased Interest 1 1 1 2 1   Down, Depressed, Hopeless 1 1 1 1 1   PHQ - 2 Score 2 2 2 3 2   Altered sleeping 0 0 0 1 1  Tired, decreased energy 0 0 0 2 1  Change in appetite 0 0 0 2 1  Feeling bad or failure about yourself  1 1 1  0 0  Trouble concentrating 0 0 0 1 3  Moving slowly or fidgety/restless 0 0 0 0 2  Suicidal thoughts 0 0 0 0 3  PHQ-9 Score 3 3 3 9 13   Difficult doing work/chores Somewhat difficult - Somewhat difficult Very difficult Very difficult  Some recent data might be hidden     Relevant past medical, surgical, family and social history reviewed. Allergies and medications reviewed and updated. History  Smoking Status  . Never Smoker  Smokeless Tobacco  . Never Used   ROS: Per HPI   Objective:    BP 131/87   Pulse 83   Temp 98.1 F (36.7 C) (Oral)   Ht 5\' 8"  (1.727 m)   Wt (!) 362 lb 3.2 oz (164.3 kg)   BMI 55.07 kg/m   Wt Readings from Last 3 Encounters:  01/04/16 (!) 362 lb 3.2 oz (164.3 kg)  11/21/15 (!) 359 lb 9.6 oz (163.1 kg)  11/19/15 (!) 364 lb 8 oz (165.3 kg)    Gen: NAD, alert, cooperative with exam, NCAT EYES: EOMI, no conjunctival injection, or no icterus ENT:  OP without erythema LYMPH: no cervical LAD CV: NRRR, normal S1/S2, no murmur Resp: CTABL,  no wheezes, normal WOB Ext: No pitting edema, warm Neuro: Alert and oriented  Assessment & Plan:  Justin Schmidt was seen today for follow-up.  Diagnoses and all orders for this visit:  Metabolic syndrome  Morbid obesity, unspecified obesity type (HCC) Discussed lifestyle changes Decrease salt, caloric intake Increase physical activity  Fatty liver Slightly decreased last check Continue to work on weight loss  Adjustment disorder with mixed anxiety and depressed mood Mood improved Continues to be in difficult situation with work, family expectations Encouraged him to cont to consider counseling  Follow up plan: Return in about 6 months (around 07/03/2016). Rex Krasarol Vincent, MD [facility]

## 2016-02-07 ENCOUNTER — Encounter: Payer: Self-pay | Admitting: Pharmacist

## 2016-02-07 ENCOUNTER — Ambulatory Visit (INDEPENDENT_AMBULATORY_CARE_PROVIDER_SITE_OTHER): Payer: Medicare Other | Admitting: Pharmacist

## 2016-02-07 VITALS — BP 130/78 | HR 84 | Ht 68.0 in | Wt 373.0 lb

## 2016-02-07 DIAGNOSIS — Z23 Encounter for immunization: Secondary | ICD-10-CM

## 2016-02-07 DIAGNOSIS — E8881 Metabolic syndrome: Secondary | ICD-10-CM

## 2016-02-07 MED ORDER — BUPROPION HCL ER (XL) 300 MG PO TB24: 300.0000 mg | ORAL_TABLET | Freq: Every day | ORAL | 1 refills | Status: DC

## 2016-02-07 NOTE — Progress Notes (Signed)
Patient ID: Justin Schmidt, male   DOB: March 02, 1981, 35 y.o.   MRN: [id number]   Subjective:     Justin Schmidt Justin Schmidt(Justin Schmidt) is a 35 y.o. male here for reevaluation of weight loss/obesity.  He has taken phentermine 37.5mg  1 tablet daily in the past and lost about 20# but has not taken in the last 12 months.  He started Wellbutrin XL 150mg  about 4 months ago for mood and to see if would help with food cravings and weight loss.  Tolerated well with initial weight loss of about 5 lbs but todays weight is up about 9 lbs since Wellbutrin was started.   Patient Reported Diet - meal replacement shake once a day; oatmeal with fruit and honey; limiting bread.  Salads with balsamic dressing.   Mostly drinking water or occ regular soda   He feels ideal weight is 225 pounds. History of eating disorders: none. There is a family history positive for obesity in the patient, father and brother. Previous treatments for obesity include self-directed dieting and  Phentermine.  Obesity associated medical conditions: depression, hyperlipidemia and pre diabetes (A1c was 5.7%). Obesity associated medications: none. Cardiovascular risk factors besides obesity: dyslipidemia, male gender and obesity (BMI >= 30 kg/m2).  Exercise - had decreased because of ankle sprain and his gym has closed but he has plans to purchase treadmill and weight set to be able to exercise at home.    Justin Schmidt lives with his grandfather who is in his 3090's and has dementia.  His grandfather cannot be left alone and Justin Schmidt is the main caretaker.  However Justin Schmidt's mom if possibly moving in soon to help with caring for grandfather.   Last Depression screening score was 9 and today was 11.  Possibly related to stress with grandfather and step father.  [facility] 02/07/2016 01/04/2016 11/21/2015 11/19/2015 10/16/2015  Decreased Interest 2 1 1 1 2   Down, Depressed, Hopeless 2 1 1 1 1   PHQ - 2 Score 4 2 2 2 3   Altered sleeping 1 0 0 0 1  Tired, decreased  energy 1 0 0 0 2  Change in appetite 1 0 0 0 2  Feeling bad or failure about yourself  1 1 1 1  0  Trouble concentrating 0 0 0 0 1  Moving slowly or fidgety/restless 2 0 0 0 0  Suicidal thoughts 1 0 0 0 0  PHQ-9 Score 11 3 3 3 9   Difficult doing work/chores Somewhat difficult Somewhat difficult - Somewhat difficult Very difficult  Some recent data might be hidden     The following portions of the patient's history were reviewed and updated as appropriate: allergies, current medications, past family history, past medical history, past social history, past surgical history and problem list.   Objective:    Body mass index is 56.71 kg/m.   Vitals:   02/07/16 1504  BP: 130/78  Pulse: 84      Assessment:    Obesity. I assessed Justin FearingJames to be in an action stage with respect to weight loss.  Metabolic syndrome with family history of diabetes / pre diabetes Depression   Plan:      1.General weight loss/lifestyle modification strategies discussed (elicit support from others; identify saboteurs; non-food rewards, etc).  Recommended avoid sugar and calorie containing beverages - soda, sweet tea and juice.  Choose water or unsweetened tea instead. 2. Increase Wellbutrin to XL 300mg  qam 3.  Encouraged Justin Schmidt Increase exercise. Wear ankle brace to help with ankle  stabilization and prevent further sprains.  Also discuss some exercises to add into routine to help strengthen ankle. 4.  Asked if he has researched bariatric surgeries further.  Patient has decided he does not want to explore this option.  5.  Influenza vaccine adminstered in office today  RTC in 4 weeks to see PCP and January 2019 to see me.   Henrene Pastor, PharmD, CPP, CDE

## 2016-03-18 ENCOUNTER — Telehealth (INDEPENDENT_AMBULATORY_CARE_PROVIDER_SITE_OTHER): Payer: Self-pay | Admitting: Specialist

## 2016-03-19 ENCOUNTER — Telehealth (INDEPENDENT_AMBULATORY_CARE_PROVIDER_SITE_OTHER): Payer: Self-pay | Admitting: Specialist

## 2016-03-19 MED ORDER — OXAPROZIN 600 MG PO TABS: 600.0000 mg | ORAL_TABLET | Freq: Every day | ORAL | 3 refills | Status: DC | PRN

## 2016-03-19 MED ORDER — ACETAMINOPHEN-CODEINE #3 300-30 MG PO TABS: 1.0000 | ORAL_TABLET | ORAL | 0 refills | Status: DC | PRN

## 2016-03-19 NOTE — Telephone Encounter (Signed)
[facility] # [phone]. Patient notified prescriptions sent.

## 2016-03-19 NOTE — Telephone Encounter (Signed)
Please advise 

## 2016-04-07 ENCOUNTER — Ambulatory Visit: Payer: Medicare Other | Admitting: Pediatrics

## 2016-04-08 ENCOUNTER — Encounter: Payer: Self-pay | Admitting: Family Medicine

## 2016-04-08 ENCOUNTER — Telehealth: Payer: Self-pay | Admitting: Family Medicine

## 2016-05-05 ENCOUNTER — Ambulatory Visit (INDEPENDENT_AMBULATORY_CARE_PROVIDER_SITE_OTHER): Payer: Medicare Other | Admitting: Pharmacist

## 2016-05-05 ENCOUNTER — Encounter: Payer: Self-pay | Admitting: Pharmacist

## 2016-05-05 VITALS — BP 132/82 | HR 82 | Ht 68.0 in | Wt 383.0 lb

## 2016-05-05 DIAGNOSIS — E8881 Metabolic syndrome: Secondary | ICD-10-CM

## 2016-05-05 MED ORDER — PHENTERMINE HCL 37.5 MG PO CAPS: 37.5000 mg | ORAL_CAPSULE | ORAL | 0 refills | Status: DC

## 2016-05-05 NOTE — Progress Notes (Signed)
Patient ID: Justin Schmidt, male   DOB: [birth year], 36 y.o.   MRN: [id number]    Subjective:     Justin Schmidt(Justin Schmidt) is a 36 y.o. male here for reevaluation of weight loss/obesity.    He started Wellbutrin XL 150mg  about 6 months ago for mood and to see if would help with food cravings and weight loss. Dose was increased to 300mg  daily in October 2017.  Tolerated well and states it has helped with depression / mood but weight has increased over the last 6 months.  He reports that the gym he use to go to regularly closed about 8 months ago and he has not gotten back into a regular work out routine.   Patient Reported Diet - frosted mini wheats with whole milk and banana or oatmeal with fruit and honey; eating out less; eating more stews or soups; limiting bread.  Mostly drinking water or occ regular soda   He feels ideal weight is 225 pounds. History of eating disorders: none. There is a family history positive for obesity in the patient, father and brother. Previous treatments for obesity include self-directed dieting and Phentermine (lost about 20# - last took about 18 months ago) Obesity associated medical conditions: depression, hyperlipidemia and pre diabetes (A1c was 5.7%). Obesity associated medications: none. Cardiovascular risk factors besides obesity: dyslipidemia, male gender and obesity (BMI >= 30 kg/m2).     Justin Schmidt lives with his grandfather who is in his 2890's and has dementia.  His grandfather cannot be left alone and Justin Schmidt is the main caretaker.  Justin Schmidt's mom has recently moved in and is helping some with caring for grandfather.    [facility] 05/05/2016 02/07/2016 01/04/2016 11/21/2015 11/19/2015  Decreased Interest 1 2 1 1 1   Down, Depressed, Hopeless 2 2 1 1 1   PHQ - 2 Score 3 4 2 2 2   Altered sleeping 0 1 0 0 0  Tired, decreased energy 1 1 0 0 0  Change in appetite 0 1 0 0 0  Feeling bad or failure about yourself  2 1 1 1 1   Trouble concentrating 0 0 0 0 0  Moving  slowly or fidgety/restless 0 2 0 0 0  Suicidal thoughts 0 1 0 0 0  PHQ-9 Score 6 11 3 3 3   Difficult doing work/chores Somewhat difficult Somewhat difficult Somewhat difficult - Somewhat difficult  Some recent data might be hidden    The following portions of the patient's history were reviewed and updated as appropriate: allergies, current medications, past family history, past medical history, past social history, past surgical history and problem list.   Objective:    Body mass index is 58.23 kg/m.  Filed Weights   05/05/16 1448  Weight: (!) 383 lb (173.7 kg)    Vitals:   05/05/16 1448  BP: 132/82  Pulse: 82    Assessment:    Obesity. I assessed Justin Schmidt to be in an action stage with respect to weight loss.  Metabolic syndrome with family history of diabetes / pre diabetes Depression   Plan:      1.General weight loss/lifestyle modification strategies discussed (elicit support from others; identify saboteurs; non-food rewards, etc).  Recommended avoid sugar and calorie containing beverages - soda, sweet tea and juice.  Choose water or unsweetened tea instead. Switch to 1% or skim milk 2. Continue Wellbutrin to XL 300mg  qam     Retry phentermine 37.5mg  qam 3.  Encouraged Justin Schmidt Increase exercise. Discussed plan to either  join new gym or begin using elliptical at home - goal is at least 30 minutes of exercise per day  RTC in 4 weeks to see me   Henrene Pastor, PharmD, CPP, CDE

## 2016-05-05 NOTE — Patient Instructions (Signed)
Try Fairlife milk or shakes.

## 2016-06-05 ENCOUNTER — Encounter: Payer: Self-pay | Admitting: Pharmacist

## 2016-06-05 ENCOUNTER — Ambulatory Visit (INDEPENDENT_AMBULATORY_CARE_PROVIDER_SITE_OTHER): Payer: Medicare Other | Admitting: Pharmacist

## 2016-06-05 DIAGNOSIS — R7303 Prediabetes: Secondary | ICD-10-CM

## 2016-06-05 NOTE — Patient Instructions (Signed)
Continue phentermine 37.5mg  - take 1 tablet each morning.   Sending referral for physical therapy.
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
## 2016-07-18 ENCOUNTER — Ambulatory Visit: Payer: Self-pay | Admitting: Pediatrics

## 2016-08-07 ENCOUNTER — Ambulatory Visit (INDEPENDENT_AMBULATORY_CARE_PROVIDER_SITE_OTHER): Payer: Medicare Other | Admitting: Pediatrics

## 2016-08-07 ENCOUNTER — Encounter: Payer: Self-pay | Admitting: Pediatrics

## 2016-08-07 DIAGNOSIS — F439 Reaction to severe stress, unspecified: Secondary | ICD-10-CM

## 2016-08-07 DIAGNOSIS — G4700 Insomnia, unspecified: Secondary | ICD-10-CM | POA: Diagnosis not present

## 2016-08-07 DIAGNOSIS — E781 Pure hyperglyceridemia: Secondary | ICD-10-CM

## 2016-08-07 MED ORDER — PHENTERMINE HCL 37.5 MG PO CAPS: 37.5000 mg | ORAL_CAPSULE | ORAL | 0 refills | Status: DC

## 2016-08-07 NOTE — Patient Instructions (Signed)
Goal blood pressure 120s/70s or lower

## 2016-08-07 NOTE — Progress Notes (Signed)
  Subjective:   Patient ID: Justin Schmidt, male    DOB: June 21, 1980, 36 y.o.   MRN: [id number] CC: Follow-up (HTN, Cholesterol)  HPI: Justin Schmidt is a 36 y.o. male presenting for Follow-up (HTN, Cholesterol)  HTN: Doesn't check at home No HA, no CP, no SOB or chest pressure  Elevated BMI: Tried phentermine, thinks was helping some Has been taking it because ran out Stress of the last couple of weeks he thinks has added weight back  Insomnia: Trying to go to bed around 9-10pm, waking up multiple times a night Now caretaker from grandfather and mother Both are awake more at night Has been waking up between 11-5pm Both mom and grandfather falling at night  Very stressed at home Ongoing conflicts with family members Feels safe at home, denies thoughts of self harm Here today with his dad who has been supportive  Relevant past medical, surgical, family and social history reviewed. Allergies and medications reviewed and updated. History  Smoking Status  . Never Smoker  Smokeless Tobacco  . Never Used   ROS: Per HPI   Objective:    BP 138/85   Pulse 98   Temp 97.7 F (36.5 C) (Oral)   Ht  (1.727 m)   Wt (!) 387 lb 3.2 oz (175.6 kg)   BMI 58.87 kg/m   Wt Readings from Last 3 Encounters:  08/07/16 (!) 387 lb 3.2 oz (175.6 kg)  06/05/16 (!) 383 lb (173.7 kg)  05/05/16 (!) 383 lb (173.7 kg)    NAD, alert, cooperative with exam, NCAT EYES: EOMI, no conjunctival injection, or no icterus CV: NRRR, normal S1/S2, no murmur, distal pulses 2+ b/l Resp: CTABL, no wheezes, normal WOB Ext: No edema, warm Neuro: Alert and oriented Psych: full affect  Assessment & Plan:  Ansley was seen today for follow-up multiple med problems  Diagnoses and all orders for this visit:  Severe obesity (BMI >= 40) (HCC) Discussed lifestyle changes, stress at home Will cont phentermine Come back in 4 weeks for refill and reassess -     phentermine 37.5 MG capsule; Take 1 capsule (37.5  mg total) by mouth every morning.  HTN Slightly elevated today Check at home, bring to next clinic visit Discussed need BPs to be well controlled to cont phenteramine  LE swelling None today, cont lasix prn  Stress at home Ongoing stress from family members, not able to remove self from living situation On wellbutrin, thinks it is helping some Not interested in additional medication now Discussed vBHI program, pt open to trying, will refer  Insomnia Discussed sleep hygiene  I spent 25 minutes with the patient with over 50% of the encounter time dedicated to counseling on the above problems.  Follow up plan: Return in about 2 weeks (around 08/21/2016). Rex Kras, MD [place]

## 2016-08-11 ENCOUNTER — Telehealth: Payer: Self-pay | Admitting: *Deleted

## 2016-08-11 NOTE — Telephone Encounter (Signed)
Referral sent to Behavioral health Per Dr. Oswaldo Done.

## 2016-08-11 NOTE — Telephone Encounter (Signed)
-----   Message from Johna Sheriff, MD sent at 08/09/2016  9:15 PM EDT ----- Regarding: needs [facility] referral The new way through [facility] isnt working for me. Can you fax per pt's info per prior procedure? Thanks, CLV

## 2016-08-22 ENCOUNTER — Encounter: Payer: Self-pay | Admitting: Pediatrics

## 2016-08-22 ENCOUNTER — Ambulatory Visit (INDEPENDENT_AMBULATORY_CARE_PROVIDER_SITE_OTHER): Payer: Medicare Other | Admitting: Pediatrics

## 2016-08-22 VITALS — BP 139/88 | HR 100 | Temp 97.6°F | Ht 68.0 in | Wt 388.0 lb

## 2016-08-22 DIAGNOSIS — R030 Elevated blood-pressure reading, without diagnosis of hypertension: Secondary | ICD-10-CM

## 2016-08-22 DIAGNOSIS — E8881 Metabolic syndrome: Secondary | ICD-10-CM | POA: Diagnosis not present

## 2016-08-22 DIAGNOSIS — F439 Reaction to severe stress, unspecified: Secondary | ICD-10-CM | POA: Diagnosis not present

## 2016-08-22 DIAGNOSIS — R7303 Prediabetes: Secondary | ICD-10-CM

## 2016-08-22 LAB — BAYER DCA HB A1C WAIVED: HB A1C: 5.9 % (ref <7.0)

## 2016-08-22 MED ORDER — PHENTERMINE HCL 37.5 MG PO CAPS: 37.5000 mg | ORAL_CAPSULE | ORAL | 1 refills | Status: DC

## 2016-08-22 MED ORDER — BUPROPION HCL ER (XL) 150 MG PO TB24: 150.0000 mg | ORAL_TABLET | Freq: Every day | ORAL | 2 refills | Status: DC

## 2016-08-22 NOTE — Progress Notes (Signed)
  Subjective:   Patient ID: Justin Schmidt, male    DOB: [birth year], 36 y.o.   MRN: [id number] CC: Follow-up (2 week) htn HPI: Justin Schmidt is a 36 y.o. male presenting for Follow-up (2 week)  Elevated BMI: Homemade cole slaw more often Low sodium Has plans to increase physical activity  Pre-diabetes: decreased soda intake apprx 1/day now  Elevated BP: no cp or HA with phentermine Didn't take phentermine this morning  Stress at home: Pleased with the [facility] program so far Continues to have trouble sleeping, stress with caring for family members with chronic med problems  Insomnia: goes to bed early am, sometimes sleeping until 12-1pm  Relevant past medical, surgical, family and social history reviewed. Allergies and medications reviewed and updated. History  Smoking Status  . Never Smoker  Smokeless Tobacco  . Never Used   ROS: Per HPI   Objective:    BP 139/88   Pulse 100   Temp 97.6 F (36.4 C) (Oral)   Ht '5\' 8"'$  (1.727 m)   Wt (!) 388 lb (176 kg)   BMI 59.00 kg/m   Wt Readings from Last 3 Encounters:  08/22/16 (!) 388 lb (176 kg)  08/07/16 (!) 387 lb 3.2 oz (175.6 kg)  06/05/16 (!) 383 lb (173.7 kg)    Gen: NAD, alert, cooperative with exam, NCAT EYES: EOMI, no conjunctival injection, or no icterus CV: NRRR, normal S1/S2, no murmur, distal pulses 2+ b/l Resp: CTABL, no wheezes, normal WOB Abd: +BS, soft, NT, obese, mildly distended. Ext: No edema, warm Neuro: Alert and oriented Psych: normal affect, no thoughts of self harm  Assessment & Plan:  Justin Schmidt was seen today for follow-up multiple med problems  Diagnoses and all orders for this visit:  Pre-diabetes A1c 5.9, was 5.8 last check Cont lifestyle changes, working on weight loss Decrease to no sodas a day, now drinking apprx 1 cherry coke   Severe obesity (BMI >= 40) (HCC) Goal 30 min exercise 5 days a week, walking Take phentermine in the morning -     phentermine 37.5 MG capsule; Take 1 capsule  (37.5 mg total) by mouth every morning.  Metabolic syndrome f/u transaminitis from likely fatty liver -     CMP14+EGFR -     TSH  Elevated blood pressure Check BPs at home while on phentermine If elevated let me know  Stress at home  Depression Not taking wellbutirn, interested in restarting Restart at '150mg'$  Cont vBHI program, pt has been pleased so far Discussed sleep hygiene again, sleep continues to be disrupted -     buPROPion (WELLBUTRIN XL) 150 MG 24 hr tablet; Take 1 tablet (150 mg total) by mouth daily.  Follow up plan: 2 mo Justin Found, MD [place]

## 2016-08-23 LAB — CMP14+EGFR
A/G RATIO: 1.8 (ref 1.2–2.2)
ALT: 70 IU/L — AB (ref 0–44)
AST: 31 IU/L (ref 0–40)
Albumin: 4.5 g/dL (ref 3.5–5.5)
Alkaline Phosphatase: 66 IU/L (ref 39–117)
BUN/Creatinine Ratio: 10 (ref 9–20)
BUN: 11 mg/dL (ref 6–20)
Bilirubin Total: 0.3 mg/dL (ref 0.0–1.2)
CALCIUM: 9.8 mg/dL (ref 8.7–10.2)
CO2: 25 mmol/L (ref 18–29)
CREATININE: 1.12 mg/dL (ref 0.76–1.27)
Chloride: 101 mmol/L (ref 96–106)
GFR calc Af Amer: 97 mL/min/{1.73_m2} (ref >59)
GFR, EST NON AFRICAN AMERICAN: 84 mL/min/{1.73_m2} (ref >59)
GLUCOSE: 141 mg/dL — AB (ref 65–99)
Globulin, Total: 2.5 g/dL (ref 1.5–4.5)
POTASSIUM: 4.2 mmol/L (ref 3.5–5.2)
Sodium: 142 mmol/L (ref 134–144)
Total Protein: 7 g/dL (ref 6.0–8.5)

## 2016-08-23 LAB — TSH: TSH: 1.54 u[IU]/mL (ref 0.450–4.500)

## 2016-08-27 ENCOUNTER — Other Ambulatory Visit: Payer: Self-pay | Admitting: Pediatrics

## 2016-08-28 LAB — HEPATITIS B SURFACE ANTIGEN: Hepatitis B Surface Ag: NEGATIVE

## 2016-08-28 LAB — HEPATITIS B CORE ANTIBODY, TOTAL: HEP B C TOTAL AB: NEGATIVE

## 2016-08-28 LAB — SPECIMEN STATUS REPORT

## 2016-08-28 LAB — HEPATITIS B SURFACE ANTIBODY,QUALITATIVE: HEP B SURFACE AB, QUAL: NONREACTIVE

## 2016-08-28 LAB — HEPATITIS C ANTIBODY: Hep C Virus Ab: <0.1 s/co ratio (ref 0.0–0.9)

## 2016-09-02 NOTE — Progress Notes (Signed)
Patient aware.

## 2016-09-04 ENCOUNTER — Ambulatory Visit (INDEPENDENT_AMBULATORY_CARE_PROVIDER_SITE_OTHER): Payer: Medicare Other | Admitting: Pediatrics

## 2016-09-04 ENCOUNTER — Encounter: Payer: Self-pay | Admitting: Pediatrics

## 2016-09-04 ENCOUNTER — Ambulatory Visit (INDEPENDENT_AMBULATORY_CARE_PROVIDER_SITE_OTHER): Payer: Medicare Other

## 2016-09-04 VITALS — BP 120/73 | HR 81 | Temp 97.9°F | Ht 68.0 in | Wt 393.0 lb

## 2016-09-04 DIAGNOSIS — M79671 Pain in right foot: Secondary | ICD-10-CM

## 2016-09-04 DIAGNOSIS — G8929 Other chronic pain: Secondary | ICD-10-CM | POA: Diagnosis not present

## 2016-09-04 DIAGNOSIS — F329 Major depressive disorder, single episode, unspecified: Secondary | ICD-10-CM | POA: Diagnosis not present

## 2016-09-04 DIAGNOSIS — M549 Dorsalgia, unspecified: Secondary | ICD-10-CM | POA: Diagnosis not present

## 2016-09-04 DIAGNOSIS — F32A Depression, unspecified: Secondary | ICD-10-CM

## 2016-09-04 MED ORDER — ACETAMINOPHEN-CODEINE #3 300-30 MG PO TABS: 1.0000 | ORAL_TABLET | Freq: Three times a day (TID) | ORAL | 0 refills | Status: DC | PRN

## 2016-09-04 MED ORDER — ESCITALOPRAM OXALATE 10 MG PO TABS: 10.0000 mg | ORAL_TABLET | Freq: Every day | ORAL | 2 refills | Status: DC

## 2016-09-04 MED ORDER — ACETAMINOPHEN-CODEINE #3 300-30 MG PO TABS: 1.0000 | ORAL_TABLET | ORAL | 0 refills | Status: DC | PRN

## 2016-09-04 NOTE — Progress Notes (Signed)
Subjective:   Patient ID: ODAY RIDINGS, male    DOB: 03-Aug-1980, 36 y.o.   MRN: [id number] CC: Edema (Foot, Bilateral 2 days); Foot Pain (Bilateral); Knee Pain (left); and Hip Pain  HPI: BRADYN SOWARD is a 36 y.o. male presenting for Edema (Foot, Bilateral 2 days); Foot Pain (Bilateral); Knee Pain (left); and Hip Pain  Here today with his father  Foot pain and foot swelling both feet Was limping favoring R foot a few days ago so dad called for appt Dad here today as well  h/o likely stress fracture in R foot, flares now and again  On tylenol #3 for low back pain, taking a couple times a week  Stayed with dad for a week, sheriff recently called to house he lives at with mom and grandfather over yelling with family members over a trashcan they wanted him to get out of a truck. His sister was there at the time. No one was hurt. He says he locked himself in the bathroom until the sheriff arrived and went to stay with his dad. Now back at home, feeling better. Still struggles with decreased mood. someitmes wishes he wasn't here anymore. Never had plan for self harm  Depression screen [facility] 2/9 09/04/2016 08/22/2016 08/07/2016 05/05/2016 02/07/2016  Decreased Interest 2 2 2 1 2   Down, Depressed, Hopeless 1 1 2 2 2   PHQ - 2 Score 3 3 4 3 4   Altered sleeping 1 1 2  0 1  Tired, decreased energy 2 1 2 1 1   Change in appetite 2 0 0 0 1  Feeling bad or failure about yourself  3 3 0 2 1  Trouble concentrating 0 0 2 0 0  Moving slowly or fidgety/restless 2 0 0 0 2  Suicidal thoughts 1 0 0 0 1  PHQ-9 Score 14 8 10 6 11   Difficult doing work/chores Somewhat difficult Somewhat difficult Somewhat difficult Somewhat difficult Somewhat difficult  Some recent data might be hidden     Relevant past medical, surgical, family and social history reviewed. Allergies and medications reviewed and updated. History  Smoking Status  . Never Smoker  Smokeless Tobacco  . Never Used   ROS: Per HPI   Objective:      BP 120/73   Pulse 81   Temp 97.9 F (36.6 C) (Oral)   Ht 5\' 8"  (1.727 m)   Wt (!) 393 lb (178.3 kg)   BMI 59.76 kg/m   Wt Readings from Last 3 Encounters:  09/04/16 (!) 393 lb (178.3 kg)  08/22/16 (!) 388 lb (176 kg)  08/07/16 (!) 387 lb 3.2 oz (175.6 kg)    Gen: NAD, alert, cooperative with exam, NCAT EYES: EOMI, no conjunctival injection, or no icterus ENT: OP without erythema LYMPH: no cervical LAD CV: NRRR, normal S1/S2, no murmur, distal pulses 2+ b/l Resp: CTABL, no wheezes, normal WOB Abd: +BS, soft, NTND. no guarding or organomegaly Ext: No edema, warm Neuro: Alert and oriented, strength equal b/l UE and LE, coordination grossly normal MSK: feet b/l with non-pitting edema, no redness or point tenderness  Assessment & Plan:  Zadkiel was seen today for edema, foot pain. Diagnoses and all orders for this visit:  Chronic back pain, unspecified back location, unspecified back pain laterality #30 tabs of below given Must follow up for pain contract for additional Rx -     acetaminophen-codeine (TYLENOL #3) 300-30 MG tablet; Take 1 tablet by mouth every 4 (four) hours as needed for moderate pain.  Right foot pain If ongoing pain will refer to ortho, h/o stress fracture -     DG Foot Complete Right; Future  Depression, unspecified depression type Ongoing symptoms On wellbutrin Will start below as well Referred to vBHI program, thinks they called but he doesn't have VM and didn't answer the phone -     escitalopram (LEXAPRO) 10 MG tablet; Take 1 tablet (10 mg total) by mouth daily.   Follow up plan: rtc 2 weeks for chronic pain management initial appt Rex Krasarol Vincent, MD [facility]

## 2016-09-11 ENCOUNTER — Encounter: Payer: Self-pay | Admitting: Pediatrics

## 2016-09-11 ENCOUNTER — Ambulatory Visit (INDEPENDENT_AMBULATORY_CARE_PROVIDER_SITE_OTHER): Payer: Medicare Other | Admitting: Pediatrics

## 2016-09-11 VITALS — BP 130/71 | HR 91 | Temp 97.3°F | Ht 68.0 in | Wt 388.2 lb

## 2016-09-11 DIAGNOSIS — M549 Dorsalgia, unspecified: Secondary | ICD-10-CM

## 2016-09-11 DIAGNOSIS — F339 Major depressive disorder, recurrent, unspecified: Secondary | ICD-10-CM | POA: Insufficient documentation

## 2016-09-11 DIAGNOSIS — Z79899 Other long term (current) drug therapy: Secondary | ICD-10-CM | POA: Insufficient documentation

## 2016-09-11 DIAGNOSIS — Z0289 Encounter for other administrative examinations: Secondary | ICD-10-CM | POA: Diagnosis not present

## 2016-09-11 DIAGNOSIS — F329 Major depressive disorder, single episode, unspecified: Secondary | ICD-10-CM | POA: Diagnosis not present

## 2016-09-11 DIAGNOSIS — G8929 Other chronic pain: Secondary | ICD-10-CM | POA: Diagnosis not present

## 2016-09-11 DIAGNOSIS — M79671 Pain in right foot: Secondary | ICD-10-CM

## 2016-09-11 DIAGNOSIS — Z79891 Long term (current) use of opiate analgesic: Secondary | ICD-10-CM | POA: Diagnosis not present

## 2016-09-11 DIAGNOSIS — F32A Depression, unspecified: Secondary | ICD-10-CM

## 2016-09-11 MED ORDER — ACETAMINOPHEN-CODEINE #3 300-30 MG PO TABS: 1.0000 | ORAL_TABLET | ORAL | 0 refills | Status: DC | PRN

## 2016-09-11 MED ORDER — IBUPROFEN 800 MG PO TABS: 800.0000 mg | ORAL_TABLET | Freq: Three times a day (TID) | ORAL | 2 refills | Status: DC | PRN

## 2016-09-11 NOTE — Progress Notes (Signed)
Subjective:   Patient ID: Justin Schmidt, male    DOB: [birth year], 36 y.o.   MRN: [id number] CC: Pain Management  HPI: Justin Schmidt is a 36 y.o. male presenting for Pain Management  Foot doing better Says he has been using ankle braces, has helped with R foot pain Not interested in MRI or referral to ortho at this time  Back pain is about the same, minimal worsening or improvement over the past   Says it has been a quiet week, getting along well family at home right now  [place] reviewed- Yes If yes- were their any concerning findings : no [facility] 2[facility] 09/11/2016 09/04/2016 08/22/2016 08/07/2016 05/05/2016  Decreased Interest 2 2 2 2 1   Down, Depressed, Hopeless 0 1 1 2 2   PHQ - 2 Score 2 3 3 4 3   Altered sleeping 1 1 1 2  0  Tired, decreased energy 1 2 1 2 1   Change in appetite 2 2 0 0 0  Feeling bad or failure about yourself  2 3 3  0 2  Trouble concentrating 2 0 0 2 0  Moving slowly or fidgety/restless 2 2 0 0 0  Suicidal thoughts 0 1 0 0 0  PHQ-9 Score 12 14 8 10 6   Difficult doing work/chores Somewhat difficult Somewhat difficult Somewhat difficult Somewhat difficult Somewhat difficult  Some recent data might be hidden    GAD 7 : Generalized Anxiety Score 09/11/2016 09/04/2016 08/22/2016 01/04/2016  Nervous, Anxious, on Edge 1 1 1 1   Control/stop worrying 2 2 2 2   Worry too much - different things 2 2 2 2   Trouble relaxing 2 2 2  0  Restless 0 0 0 0  Easily annoyed or irritable 1 3 3 1   Afraid - awful might happen 0 0 0 0  Total GAD 7 Score 8 10 10 6   Anxiety Difficulty Somewhat difficult - Somewhat difficult Somewhat difficult   [employer] drug screen performed- Yes, ordered today  SOAPP  0= never  1= seldom  2=sometimes  3= often  4= very often  How often do you have mood swings? 2 How often do you smoke a cigarette within an hour after waling up? 0 How often have you taken medication other than the way that it was  prescribed?0 How often have you used illegal drugs in the past 5 years? 0 How often, in your lifetime, have you had legal problems or been arrested? 0  Score 2   Alcohol Audit - How often during the last year have found that you: 0-Never   1- Less than monthly   2- Monthly     3-Weekly     4-daily or almost daily  - found that you were not able to stop drinking once you started- 1 -failed to do what was normally expected of you because of drinking- 0 -needed a first drink in the morning- 0 -had a feeling of guilt or remorse after drinking- 0 -are/were unable to remember what happened the night before because of your drinking- 0  0- NO   2- yes but not in last year  4- yes during last year -Have you or someone else been injured because of your drinking- 0 - Has anyone been concerned about your drinking or suggested you cut down- 0        TOTAL- 1   ( 0-7- alcohol education, 8-15- simple advice, 16-19 simple advice plus counseling, 20-40 referral for evaluation and  treatment 0   Designated Pharmacy- [facility]  Pain assessment: Cause of pain- low back pain, previously followed by Dr. Havery Moros for disc problems, has never had surgery Pain on scale of 1-10- now feels fine, sometimes gets up to 10/10, at best pain gets to 0/10, better with activity Frequency- back pain bothers him a few times a week, gets headaches sometimes.  What increases pain- some activities make pain worse What makes pain Better- gentle walking helps  Effects on ADL - taking the codeine helps him get up, do ADLs, taking care of grandfather, working around farm and house  Prior treatments tried and failed- NSAIDs, tylenol Current treatments- tylenol with codeine Morphine mg equivalent-   Pain management agreement reviewed and signed- Yes   Relevant past medical, surgical, family and social history reviewed. Allergies and medications reviewed and updated. History  Smoking Status  . Never Smoker    Smokeless Tobacco  . Never Used   ROS: Per HPI   Objective:    BP 130/71   Pulse 91   Temp 97.3 F (36.3 C) (Oral)   Ht 5\' 8"  (1.727 m)   Wt (!) 388 lb 3.2 oz (176.1 kg)   BMI 59.03 kg/m   Wt Readings from Last 3 Encounters:  09/11/16 (!) 388 lb 3.2 oz (176.1 kg)  09/04/16 (!) 393 lb (178.3 kg)  08/22/16 (!) 388 lb (176 kg)    Gen: NAD, alert, obese, cooperative with exam, NCAT EYES: EOMI, no conjunctival injection, or no icterus CV: NRRR, normal S1/S2, no murmur, distal pulses 2+ b/l Resp: CTABL, no wheezes, normal WOB Abd: +BS, soft, NTND. no guarding or organomegaly Ext: No pitting edema, warm Neuro: Alert and oriented, strength equal b/l hip flexors, knee flex/ext, sensation intact b/l LE MSK: no point tenderness over spine or paraspinal muscles  Assessment & Plan:  Justin Schmidt was seen today for pain management.  Diagnoses and all orders for this visit:  Pain management contract agreement Discussed contract, signed -     Tox[employer]SURE Select 13 (MW), Urine  Chronic back pain, unspecified back location, unspecified back pain laterality Will cont tylenol with codeine as below Helping pt with ADLs, keep him active -     acetaminophen-codeine (TYLENOL #3) 300-30 MG tablet; Take 1 tablet by mouth every 4 (four) hours as needed for moderate pain. -     ibuprofen (ADVIL,MOTRIN) 800 MG tablet; Take 1 tablet (800 mg total) by mouth every 8 (eight) hours as needed.  Depression, unspecified depression type Stable, no thoughts of self harm On lexapro and wellbutrin  Right foot pain Improved, if worsens will refer to ortho, declines further imaging at this time  Elevated BMI Walk daily Decrease soda intake  Follow up plan: 3 mo Rex Kras, MD [place]

## 2016-09-16 ENCOUNTER — Other Ambulatory Visit: Payer: Self-pay | Admitting: *Deleted

## 2016-09-16 DIAGNOSIS — F329 Major depressive disorder, single episode, unspecified: Secondary | ICD-10-CM

## 2016-09-16 DIAGNOSIS — F32A Depression, unspecified: Secondary | ICD-10-CM

## 2016-09-16 LAB — TOXASSURE SELECT 13 (MW), URINE

## 2016-09-16 MED ORDER — ESCITALOPRAM OXALATE 10 MG PO TABS: 10.0000 mg | ORAL_TABLET | Freq: Every day | ORAL | 2 refills | Status: DC

## 2016-09-19 ENCOUNTER — Other Ambulatory Visit: Payer: Self-pay | Admitting: Pediatrics

## 2016-09-19 DIAGNOSIS — F32A Depression, unspecified: Secondary | ICD-10-CM

## 2016-09-19 DIAGNOSIS — F329 Major depressive disorder, single episode, unspecified: Secondary | ICD-10-CM

## 2016-09-19 MED ORDER — ESCITALOPRAM OXALATE 10 MG PO TABS: 10.0000 mg | ORAL_TABLET | Freq: Every day | ORAL | 2 refills | Status: DC

## 2016-09-19 NOTE — Progress Notes (Signed)
Attempted to contact patient - NA °

## 2016-09-24 ENCOUNTER — Encounter (HOSPITAL_COMMUNITY): Payer: Self-pay | Admitting: Emergency Medicine

## 2016-09-24 ENCOUNTER — Emergency Department (HOSPITAL_COMMUNITY)
Admission: EM | Admit: 2016-09-24 | Discharge: 2016-09-24 | Disposition: A | Discharge status: Home / Self Care | Payer: Medicare Other | Attending: Emergency Medicine | Admitting: Emergency Medicine

## 2016-09-24 DIAGNOSIS — L03116 Cellulitis of left lower limb: Secondary | ICD-10-CM | POA: Diagnosis not present

## 2016-09-24 DIAGNOSIS — Z79899 Other long term (current) drug therapy: Secondary | ICD-10-CM | POA: Insufficient documentation

## 2016-09-24 LAB — CBC WITH DIFFERENTIAL/PLATELET
Basophils Absolute: 0.1 10*3/uL (ref 0.0–0.1)
Basophils Relative: 1 %
EOS ABS: 0.1 10*3/uL (ref 0.0–0.7)
EOS PCT: 2 %
HCT: 41.4 % (ref 39.0–52.0)
HEMOGLOBIN: 14 g/dL (ref 13.0–17.0)
LYMPHS ABS: 1.4 10*3/uL (ref 0.7–4.0)
Lymphocytes Relative: 21 %
MCH: 31.5 pg (ref 26.0–34.0)
MCHC: 33.8 g/dL (ref 30.0–36.0)
MCV: 93.2 fL (ref 78.0–100.0)
MONO ABS: 0.5 10*3/uL (ref 0.1–1.0)
MONOS PCT: 8 %
Neutro Abs: 4.3 10*3/uL (ref 1.7–7.7)
Neutrophils Relative %: 68 %
Platelets: 263 10*3/uL (ref 150–400)
RBC: 4.44 MIL/uL (ref 4.22–5.81)
RDW: 13.3 % (ref 11.5–15.5)
WBC: 6.3 10*3/uL (ref 4.0–10.5)

## 2016-09-24 LAB — BASIC METABOLIC PANEL
Anion gap: 8 (ref 5–15)
BUN: 15 mg/dL (ref 6–20)
CALCIUM: 9.3 mg/dL (ref 8.9–10.3)
CHLORIDE: 104 mmol/L (ref 101–111)
CO2: 28 mmol/L (ref 22–32)
CREATININE: 1.2 mg/dL (ref 0.61–1.24)
GFR calc Af Amer: >60 mL/min (ref >60)
GFR calc non Af Amer: >60 mL/min (ref >60)
Glucose, Bld: 93 mg/dL (ref 65–99)
Potassium: 3.8 mmol/L (ref 3.5–5.1)
SODIUM: 140 mmol/L (ref 135–145)

## 2016-09-24 LAB — HEPATIC FUNCTION PANEL
ALT: 65 U/L — ABNORMAL HIGH (ref 17–63)
AST: 38 U/L (ref 15–41)
Albumin: 3.9 g/dL (ref 3.5–5.0)
Alkaline Phosphatase: 58 U/L (ref 38–126)
Bilirubin, Direct: 0.1 mg/dL (ref 0.1–0.5)
Indirect Bilirubin: 0.4 mg/dL (ref 0.3–0.9)
Total Bilirubin: 0.5 mg/dL (ref 0.3–1.2)
Total Protein: 7.2 g/dL (ref 6.5–8.1)

## 2016-09-24 MED ORDER — CEPHALEXIN 500 MG PO CAPS
500.0000 mg | ORAL_CAPSULE | Freq: Once | ORAL | Status: AC
  Administered 2016-09-24: 500 mg via ORAL
  Filled 2016-09-24: qty 1

## 2016-09-24 MED ORDER — CEPHALEXIN 500 MG PO CAPS: 500.0000 mg | ORAL_CAPSULE | Freq: Four times a day (QID) | ORAL | 0 refills | Status: DC

## 2016-09-24 NOTE — Discharge Instructions (Signed)
Please take antibiotics for a skin infection of the left leg.  Return for worsening symptoms, including fever, worsening pain or swelling, confusion, or any other symptoms concerning to you.

## 2016-09-24 NOTE — ED Provider Notes (Signed)
[facility]-EM[facility]Y DEPT Provider Note   CSN: [id number] Arrival date & time: 09/24/16  1747     History   Chief Complaint Chief Complaint  Patient presents with  . Leg Swelling    HPI Justin Schmidt is a 36 y.o. male.  HPI 66 old male who presents with bilateral lower extremity swelling. He has a history of morbid obesity and prediabetes. Has had intermittent swelling of his legs has been ongoing for a long time. He typically they do go down on its own, and he is also been placed on Lasix by his PCP which helps. If it is over the past few days as he has been on his feet more that his swelling in his legs have gotten worse and not going down despite Lasix. He had a blister on the left shin 2 days ago, that ruptured clear fluid and since then he has had mild pain associated with it and increased redness around it. No fevers or chills, nausea or vomiting, chest pain, difficulty breathing, calf tenderness. No history of PE/DVT, family history of thromboembolic disease, recent immobilization. Past Medical History:  Diagnosis Date  . Back pain   . Gallstones   . Hyperlipidemia   . Pre-diabetes     Patient Active Problem List   Diagnosis Date Noted  . Pain management contract agreement 09/11/2016  . Chronic back pain 09/11/2016  . Depression 09/11/2016  . Fatty liver 05/17/2015  . Elevated transaminase level 05/17/2015  . Stress at home 05/17/2015  . Metabolic syndrome 02/01/2015  . Severe obesity (BMI >= 40) (HCC) 09/05/2013  . Hypertriglyceridemia 09/05/2013    Past Surgical History:  Procedure Laterality Date  . APPENDECTOMY  1991  . CHOLECYSTECTOMY  01/23/2012   Procedure: LAPAROSCOPIC CHOLECYSTECTOMY;  Surgeon: Fabio Bering, MD;  Location: [facility] ORS;  Service: General;  Laterality: N/A;  . TONSILLECTOMY         Home Medications    Prior to Admission medications   Medication Sig Start Date End Date Taking? Authorizing Provider  acetaminophen-codeine (TYLENOL #3) 300-30  MG tablet Take 1 tablet by mouth every 4 (four) hours as needed for moderate pain. 09/11/16  Yes Johna Sheriff, MD  Arginine 500 MG CAPS Take 1 capsule by mouth daily.   Yes [provider]  Ascorbic Acid (VITAMIN C) 1000 MG tablet Take 1,000 mg by mouth daily.   Yes [provider]  Cholecalciferol (VITAMIN D) 2000 UNITS tablet Take 2,000 Units by mouth daily.   Yes [provider]  Coenzyme Q10 100 MG TABS Take 1 tablet (100 mg total) by mouth daily. 05/12/14  Yes Henrene Pastor, PharmD  escitalopram (LEXAPRO) 10 MG tablet Take 1 tablet (10 mg total) by mouth daily. 09/19/16  Yes Johna Sheriff, MD  Flaxseed, Linseed, (FLAX SEED OIL PO) Take 1,200 mg by mouth daily.   Yes [provider]  fluticasone (FLONASE) 50 MCG/ACT nasal spray Place 2 sprays into both nostrils daily. 03/20/15  Yes Johna Sheriff, MD  furosemide (LASIX) 20 MG tablet Take 1 tablet (20 mg total) by mouth daily as needed. 11/21/15  Yes Dettinger, Elige Radon, MD  Ginger, Zingiber officinalis, (GINGER ROOT PO) Take 1 capsule by mouth daily.   Yes [provider]  ibuprofen (ADVIL,MOTRIN) 800 MG tablet Take 1 tablet (800 mg total) by mouth every 8 (eight) hours as needed. 09/11/16  Yes Johna Sheriff, MD  Misc Natural Products (GREEN TEA) TABS Take 315 mg by mouth daily. 05/12/14  Yes Henrene Pastor, PharmD  Multiple Vitamin (MULTIVITAMIN WITH MINERALS) TABS Take 1 tablet by mouth daily.   Yes [provider]  TURMERIC CURCUMIN PO Take 1 capsule by mouth daily.   Yes [provider]  buPROPion (WELLBUTRIN XL) 150 MG 24 hr tablet Take 1 tablet (150 mg total) by mouth daily. 08/22/16   Johna Sheriff, MD  cephALEXin (KEFLEX) 500 MG capsule Take 1 capsule (500 mg total) by mouth 4 (four) times daily. 09/24/16   Lavera Guise, MD  Nutritional Supplements (DHEA PO) Take 50 mg by mouth daily. On days after workout    [provider]  phentermine 37.5 MG capsule Take 1  capsule (37.5 mg total) by mouth every morning. 08/22/16   Johna Sheriff, MD    Family History Family History  Problem Relation Age of Onset  . Diabetes Mother   . Diabetes Father   . Hypertension Father   . Hyperlipidemia Father   . Migraines Sister   . Diabetes Maternal Uncle   . Diabetes Maternal Grandmother   . Diabetes Other     Social History Social History  Substance Use Topics  . Smoking status: Never Smoker  . Smokeless tobacco: Never Used  . Alcohol use 0.6 oz/week    1 Cans of beer per week     Allergies   Patient has no known allergies.   Review of Systems Review of Systems  Constitutional: Negative for fever.  Respiratory: Negative for shortness of breath.   Cardiovascular: Negative for chest pain.  Gastrointestinal: Negative for nausea and vomiting.  Skin: Positive for wound.  Allergic/Immunologic: Negative for immunocompromised state.  Hematological: Does not bruise/bleed easily.  Psychiatric/Behavioral: Negative for confusion.  All other systems reviewed and are negative.    Physical Exam Updated Vital Signs BP 124/79 (BP Location: Left Arm)   Pulse (!) 102   Temp 98.2 F (36.8 C) (Oral)   Resp 18   Ht 5\' 8"  (1.727 m)   Wt (!) 176 kg (388 lb)   SpO2 97%   BMI 59.00 kg/m   Physical Exam Physical Exam  Nursing note and vitals reviewed. Constitutional: Well developed, well nourished, non-toxic, and in no acute distress Head: Normocephalic and atraumatic.  Mouth/Throat: Oropharynx is clear and moist.  Neck: Normal range of motion. Neck supple.  Cardiovascular: Normal rate and regular rhythm.   Pulmonary/Chest: Effort normal and breath sounds normal.  Abdominal: Soft. There is no tenderness. There is no rebound and no guarding.  Musculoskeletal: Normal range of motion. bilateral LE pedal edema. There is 1 x 1 area of denuded skin/open blister without active drainage. Small area of erythema, warmth and induration surrounding this.    Neurological: Alert, no facial droop, fluent speech, moves all extremities symmetrically Skin: Skin is warm and dry.  Psychiatric: Cooperative   ED Treatments / Results  Labs (all labs ordered are listed, but only abnormal results are displayed) Labs Reviewed  HEPATIC FUNCTION PANEL - Abnormal; Notable for the following:       Result Value   ALT 65 (*)    All other components within normal limits  CBC WITH DIFFERENTIAL/PLATELET  BASIC METABOLIC PANEL    EKG  EKG Interpretation None       Radiology No results found.  Procedures Procedures (including critical care time)  Medications Ordered in ED Medications  cephALEXin (KEFLEX) capsule 500 mg (not administered)     Initial Impression / Assessment and Plan / ED Course  I have  reviewed the triage vital signs and the nursing notes.  Pertinent labs & imaging results that were available during my care of the patient were reviewed by me and considered in my medical decision making (see chart for details).     Open blister over left lower leg with some surrounding warmth, erythema c/f cellulitis. Suspect some venous insufficiency at baseline causing longstanding history of intermittent bilateral LE swelling.  No systemic signs or symptoms of illness. Will treat with course of keflex. Strict return and follow-up instructions reviewed. He expressed understanding of all discharge instructions and felt comfortable with the plan of care.   Final Clinical Impressions(s) / ED Diagnoses   Final diagnoses:  Cellulitis of left lower extremity    New Prescriptions New Prescriptions   CEPHALEXIN (KEFLEX) 500 MG CAPSULE    Take 1 capsule (500 mg total) by mouth 4 (four) times daily.     Lavera GuiseLiu, Juliane Guest Duo, MD 09/24/16 2119

## 2016-09-24 NOTE — ED Triage Notes (Signed)
Pt reports increased leg/extremity swelling x1 week. Pt reports increased redness and pain x2 days. Pt denies any known fevers, nausea/vomiting. nad noted.

## 2016-10-02 ENCOUNTER — Other Ambulatory Visit: Payer: Self-pay | Admitting: *Deleted

## 2016-10-02 ENCOUNTER — Other Ambulatory Visit: Payer: Self-pay | Admitting: Pediatrics

## 2016-10-02 DIAGNOSIS — M549 Dorsalgia, unspecified: Principal | ICD-10-CM

## 2016-10-02 DIAGNOSIS — M7989 Other specified soft tissue disorders: Secondary | ICD-10-CM

## 2016-10-02 DIAGNOSIS — G8929 Other chronic pain: Secondary | ICD-10-CM

## 2016-10-02 IMAGING — CT CT ABDOMEN WO/W CM
3 of 9 series · 11 of 46 positions shown, 17 images · IV contrast (Omnipaque 300)
Comparison: No priors.

CLINICAL DATA: 34-year-old male with abnormal ultrasound. Evaluate
for potential cirrhosis. Difficulty losing weight.

EXAM:
CT ABDOMEN WITHOUT AND WITH CONTRAST
TECHNIQUE: Multidetector CT imaging of the abdomen was performed following the
standard protocol before and following the bolus administration of
intravenous contrast.
CONTRAST:  150mL OMNIPAQUE IOHEXOL 300 MG/ML  SOLN

[Series 3: delay · axial · delayed · 0.98mm/px · z∈[-282,-238]mm · 2 of 104 slices shown]
[im 15/104  soft-tissue]
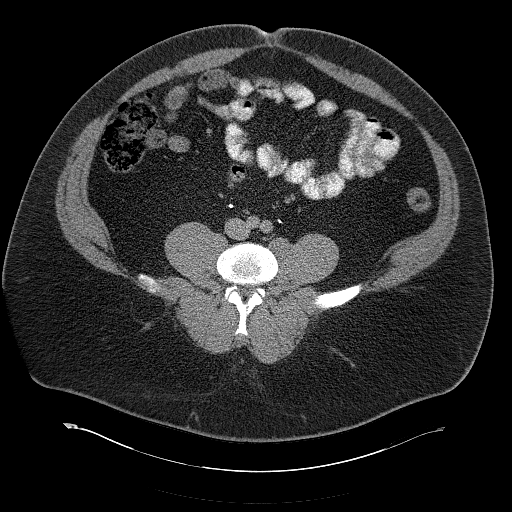
[im 30/104  soft-tissue]
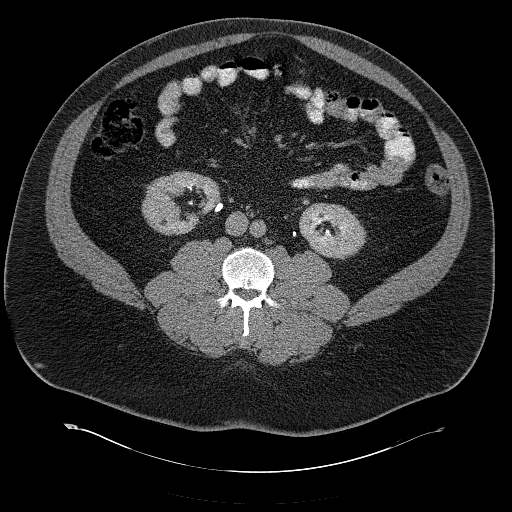

[Series 5: mpr arterial cor 3.0mm · coronal · arterial · 0.65mm/px · 3 of 130 slices shown, 4 images]
[im 33/130  soft-tissue]
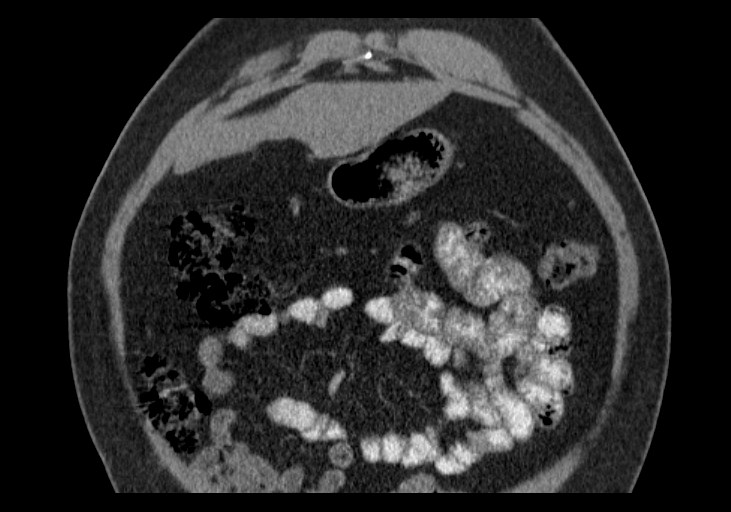
[im 65/130  soft-tissue]
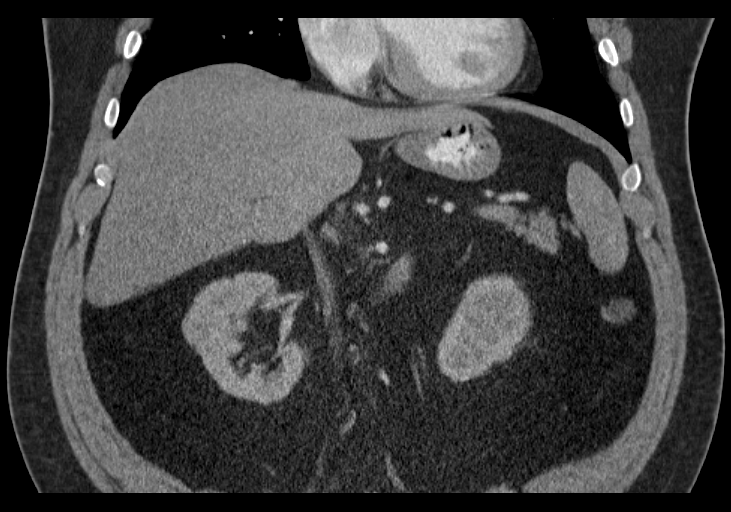
[im 65/130  bone]
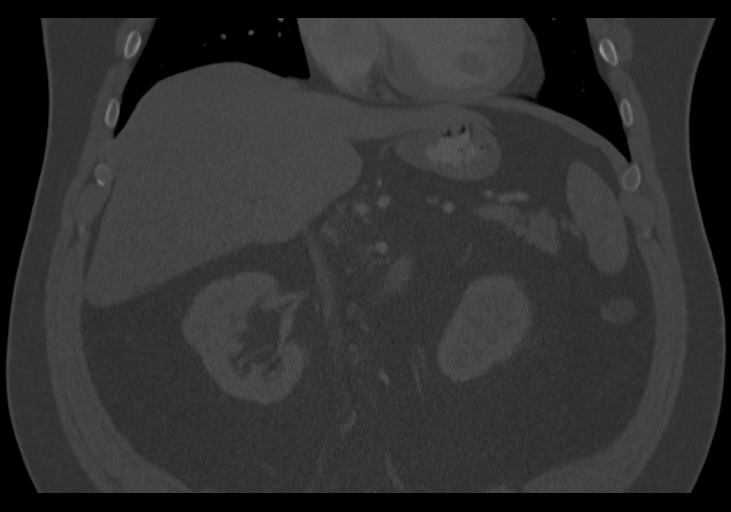
[im 97/130  soft-tissue]
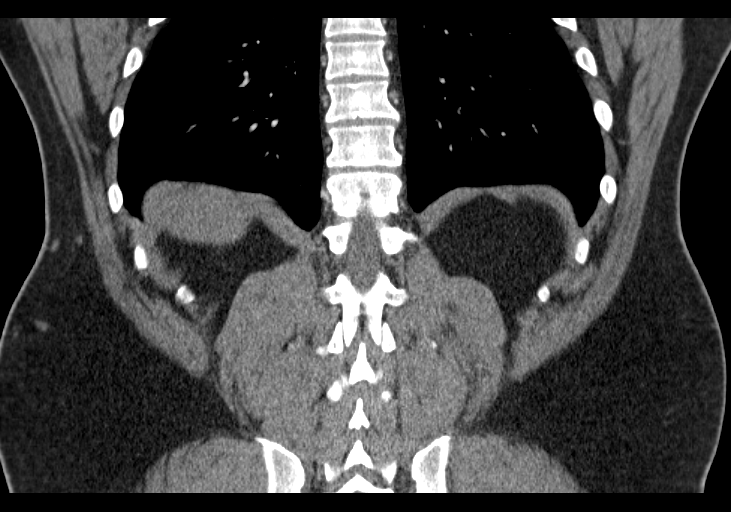

[Series 8: venous 60 sec 3.0 b40f · axial · portal-venous · 0.98mm/px · z∈[-286,-61]mm · 6 of 105 slices shown, 11 images]
[im 15/105  soft-tissue]
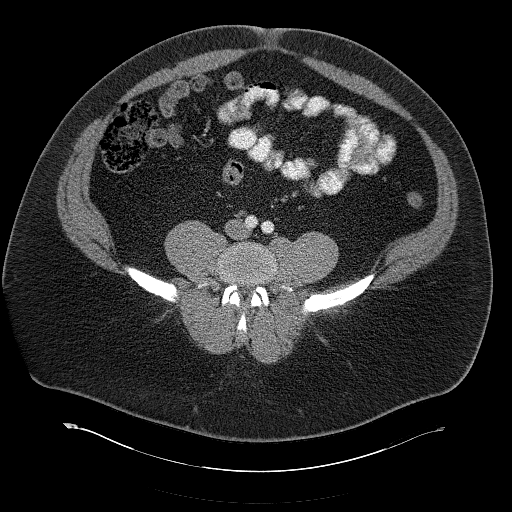
[im 15/105  bone]
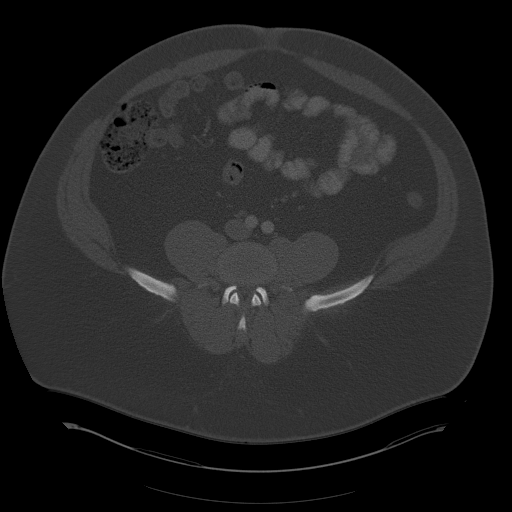
[im 30/105  soft-tissue]
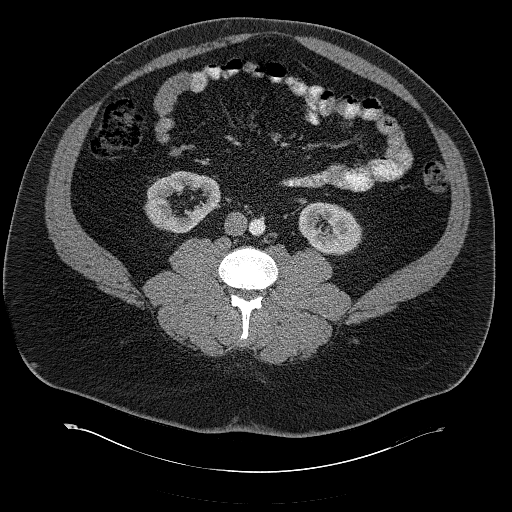
[im 45/105  soft-tissue]
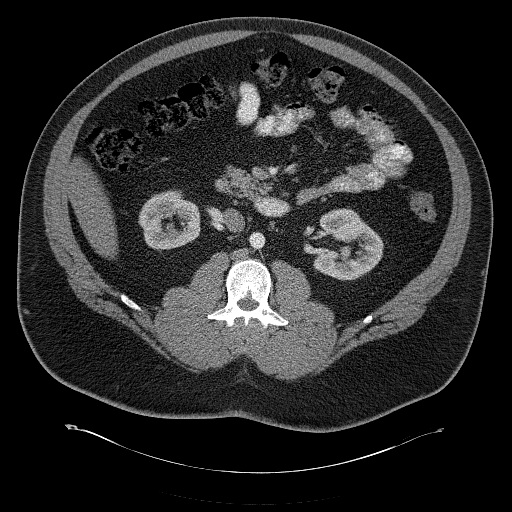
[im 45/105  lung]
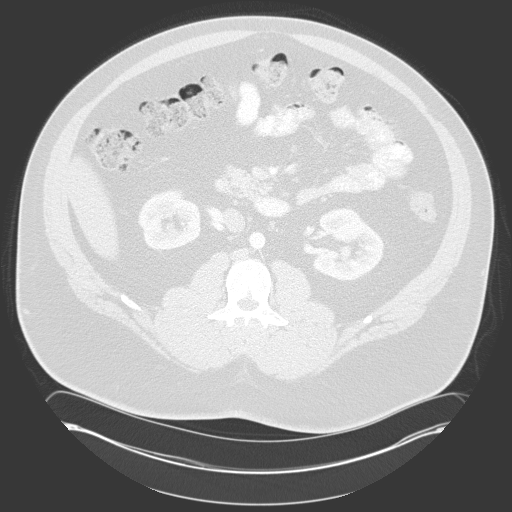
[im 60/105  soft-tissue]
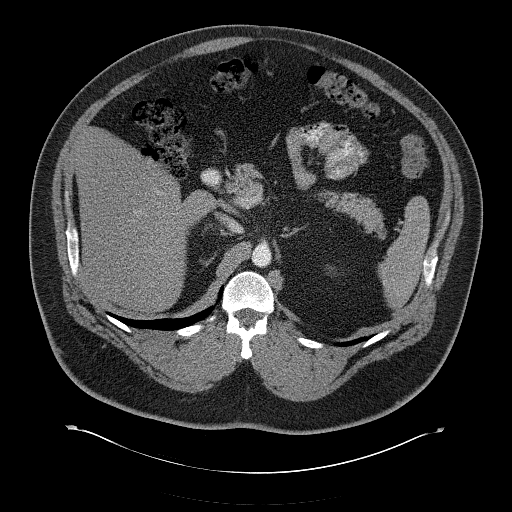
[im 60/105  lung]
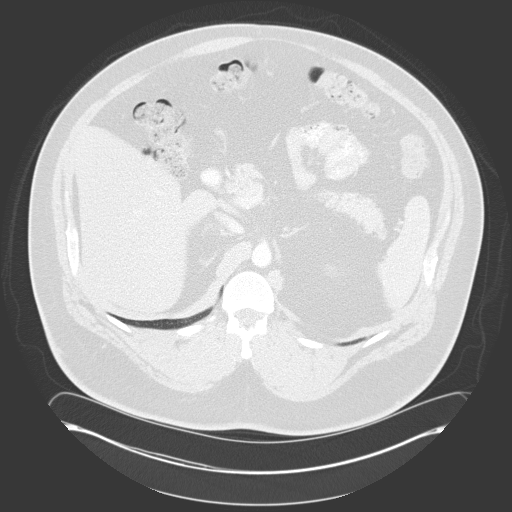
[im 75/105  soft-tissue]
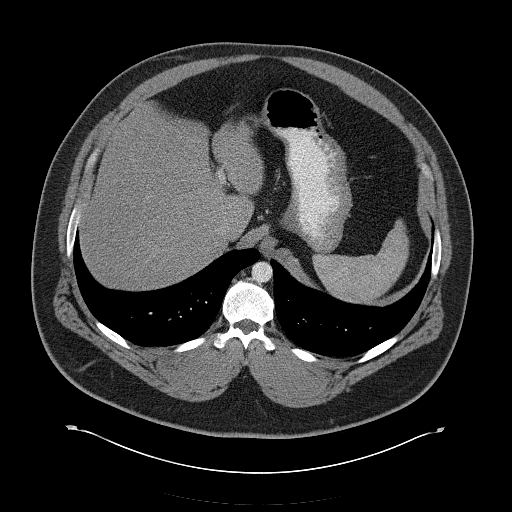
[im 75/105  lung]
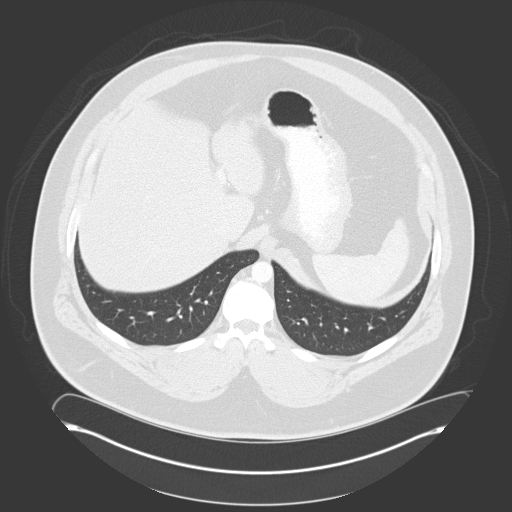
[im 90/105  soft-tissue]
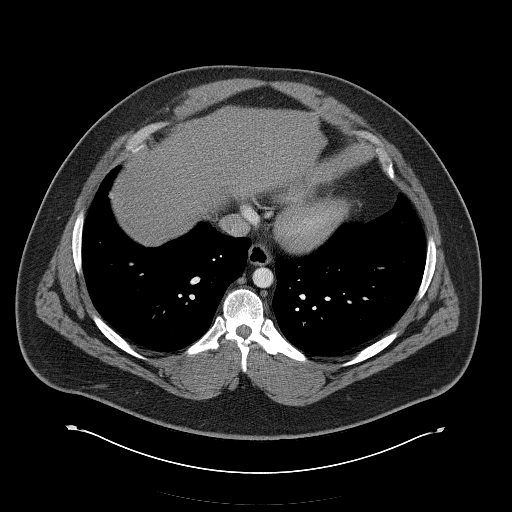
[im 90/105  lung]
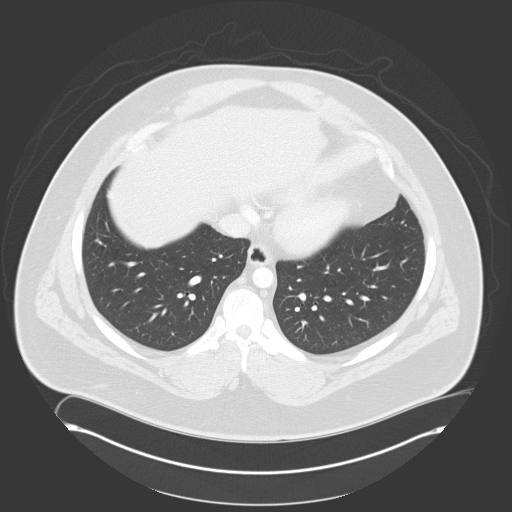

[11 of 46 positions shown; findings below may reference images not displayed]

FINDINGS: Lower chest:  Unremarkable.

Hepatobiliary: Diffuse low attenuation throughout the hepatic
parenchyma, compatible with hepatic steatosis. No definite
morphologic changes in the liver to strongly suggest cirrhosis at
this time. No cystic or solid hepatic lesions. No intra or
extrahepatic biliary ductal dilatation. Gallbladder is normal in
appearance.

Pancreas: No pancreatic mass. No pancreatic ductal dilatation. No
pancreatic or peripancreatic fluid or inflammatory changes.

Spleen: Unremarkable.

Adrenals/Urinary Tract: Bilateral adrenal glands and bilateral
kidneys are normal in appearance. No hydroureteronephrosis in the
visualized abdomen.

Stomach/Bowel: Stomach is normal in appearance. No pathologic
dilatation of visualized portions of small bowel or colon.

Vascular/Lymphatic: No significant atherosclerotic disease, aneurysm
or dissection identified in the abdominal vasculature. No
lymphadenopathy noted in the abdomen.

Other: No significant volume of ascites and no pneumoperitoneum in
the visualized portions of the peritoneal cavity.

Musculoskeletal: There are no aggressive appearing lytic or blastic
lesions noted in the visualized portions of the skeleton.
IMPRESSION: 1. No findings to suggest hepatic cirrhosis at this time. However,
there is hepatic steatosis.
2. Status post cholecystectomy.
3. No acute findings.

## 2016-10-22 ENCOUNTER — Other Ambulatory Visit: Payer: Self-pay | Admitting: Pediatrics

## 2016-11-05 DIAGNOSIS — W57XXXA Bitten or stung by nonvenomous insect and other nonvenomous arthropods, initial encounter: Secondary | ICD-10-CM | POA: Diagnosis not present

## 2016-11-05 DIAGNOSIS — S30861A Insect bite (nonvenomous) of abdominal wall, initial encounter: Secondary | ICD-10-CM | POA: Diagnosis not present

## 2016-11-14 ENCOUNTER — Encounter: Payer: Self-pay | Admitting: Vascular Surgery

## 2016-11-20 ENCOUNTER — Ambulatory Visit (HOSPITAL_COMMUNITY)
Admission: RE | Admit: 2016-11-20 | Discharge: 2016-11-20 | Disposition: A | Discharge status: Home / Self Care | Payer: Medicare Other | Source: Ambulatory Visit | Attending: Vascular Surgery | Admitting: Vascular Surgery

## 2016-11-20 ENCOUNTER — Encounter: Payer: Self-pay | Admitting: Vascular Surgery

## 2016-11-20 ENCOUNTER — Ambulatory Visit (INDEPENDENT_AMBULATORY_CARE_PROVIDER_SITE_OTHER): Payer: Medicare Other | Admitting: Vascular Surgery

## 2016-11-20 VITALS — BP 114/74 | HR 85 | Temp 97.2°F | Resp 16 | Ht 68.0 in | Wt 389.0 lb

## 2016-11-20 DIAGNOSIS — I83029 Varicose veins of left lower extremity with ulcer of unspecified site: Secondary | ICD-10-CM | POA: Diagnosis not present

## 2016-11-20 DIAGNOSIS — I872 Venous insufficiency (chronic) (peripheral): Secondary | ICD-10-CM | POA: Diagnosis not present

## 2016-11-20 DIAGNOSIS — L97929 Non-pressure chronic ulcer of unspecified part of left lower leg with unspecified severity: Secondary | ICD-10-CM | POA: Diagnosis not present

## 2016-11-20 DIAGNOSIS — Z6841 Body Mass Index (BMI) 40.0 and over, adult: Secondary | ICD-10-CM | POA: Diagnosis not present

## 2016-11-20 DIAGNOSIS — M7989 Other specified soft tissue disorders: Secondary | ICD-10-CM

## 2016-11-20 DIAGNOSIS — L97919 Non-pressure chronic ulcer of unspecified part of right lower leg with unspecified severity: Secondary | ICD-10-CM

## 2016-11-20 DIAGNOSIS — I83813 Varicose veins of bilateral lower extremities with pain: Secondary | ICD-10-CM

## 2016-11-20 DIAGNOSIS — I83019 Varicose veins of right lower extremity with ulcer of unspecified site: Secondary | ICD-10-CM | POA: Diagnosis not present

## 2016-11-20 NOTE — Progress Notes (Signed)
Referring Physician: Crista Curbana Liu, MD Patient name: Justin Schmidt MRN: [id number] DOB: 08-11-1980 Sex: male  REASON FOR CONSULT: recurrent cellulitis venous related  HPI: Justin Schmidt is a 36 y.o. male heard for evaluation of recurrent ulceration cellulitis and possible venous etiology.  He has had multiple episodes of cellulitis in the past. Over the last several months he has had recurrent exacerbation and remission of ulcerations in the pretibial region. He has been wearing compression stockings but only about twice per month. He denies any prior history of DVT. He has no history of varicose veins in his family. He just recently completed a course of Keflex for a cellulitis episode. He states he has gained weight over the last few years. He has become more sedentary. He has tried to change his diet but still has a diet heavy in sugary soda drinks. He currently weighs loss 400 pounds. I had a lengthy discussion with the patient today regarding the possibility of weight loss reduction surgery. He states that he will think about this. Other medical problems include chronic back pain hyperlipidemia. Diabetes.  Past Medical History:  Diagnosis Date  . Back pain   . Gallstones   . Hyperlipidemia   . Pre-diabetes    Past Surgical History:  Procedure Laterality Date  . APPENDECTOMY  1991  . CHOLECYSTECTOMY  01/23/2012   Procedure: LAPAROSCOPIC CHOLECYSTECTOMY;  Surgeon: Fabio BeringBrent C Ziegler, MD;  Location: [facility];  Service: General;  Laterality: N/A;  . TONSILLECTOMY      Family History  Problem Relation Age of Onset  . Diabetes Mother   . Diabetes Father   . Hypertension Father   . Hyperlipidemia Father   . Migraines Sister   . Diabetes Maternal Uncle   . Diabetes Maternal Grandmother   . Diabetes Other     SOCIAL HISTORY: Social History   Social History  . Marital status: Single    Spouse name: N/A  . Number of children: N/A  . Years of education: N/A   Occupational History  . Not  on file.   Social History Main Topics  . Smoking status: Never Smoker  . Smokeless tobacco: Never Used  . Alcohol use 0.6 oz/week    1 Cans of beer per week  . Drug use: No  . Sexual activity: No   Other Topics Concern  . Not on file   Social History Narrative  . No narrative on file    No Known Allergies  Current Outpatient Prescriptions  Medication Sig Dispense Refill  . acetaminophen-codeine (TYLENOL #3) 300-30 MG tablet Take 1 tablet by mouth every 4 (four) hours as needed for moderate pain. 30 tablet 0  . Arginine 500 MG CAPS Take 1 capsule by mouth daily.    . Ascorbic Acid (VITAMIN C) 1000 MG tablet Take 1,000 mg by mouth daily.    Marland Kitchen. buPROPion (WELLBUTRIN XL) 150 MG 24 hr tablet Take 1 Tablet by mouth once daily 30 tablet 0  . cephALEXin (KEFLEX) 500 MG capsule Take 1 capsule (500 mg total) by mouth 4 (four) times daily. 20 capsule 0  . Cholecalciferol (VITAMIN D) 2000 UNITS tablet Take 2,000 Units by mouth daily.    . Coenzyme Q10 100 MG TABS Take 1 tablet (100 mg total) by mouth daily.  0  . escitalopram (LEXAPRO) 10 MG tablet Take 1 tablet (10 mg total) by mouth daily. 30 tablet 2  . Flaxseed, Linseed, (FLAX SEED OIL PO) Take 1,200 mg by mouth daily.    .Marland Kitchen  fluticasone (FLONASE) 50 MCG/ACT nasal spray Place 2 sprays into both nostrils daily. 16 g 2  . furosemide (LASIX) 20 MG tablet Take 1 tablet (20 mg total) by mouth daily as needed. 30 tablet 3  . Ginger, Zingiber officinalis, (GINGER ROOT PO) Take 1 capsule by mouth daily.    . IBU 800 MG tablet Take 1 Tablet by mouth every 8 hours as needed 30 tablet 0  . Misc Natural Products (GREEN TEA) TABS Take 315 mg by mouth daily.    . Multiple Vitamin (MULTIVITAMIN WITH MINERALS) TABS Take 1 tablet by mouth daily.    . Nutritional Supplements (DHEA PO) Take 50 mg by mouth daily. On days after workout    . oxaprozin (DAYPRO) 600 MG tablet Take 600 mg by mouth daily.    . phentermine 37.5 MG capsule Take 1 capsule (37.5 mg  total) by mouth every morning. 30 capsule 1  . TURMERIC CURCUMIN PO Take 1 capsule by mouth daily.     No current facility-administered medications for this visit.    Facility-Administered Medications Ordered in Other Visits  Medication Dose Route Frequency Provider Last Rate Last Dose  . sodium chloride irrigation 0.9 %    PRN Tilford Pillar, MD   3,000 mL at 01/27/12 0739    ROS:   General:  No weight loss, Fever, chills  HEENT: No recent headaches, no nasal bleeding, no visual changes, no sore throat  Neurologic: No dizziness, blackouts, seizures. No recent symptoms of stroke or mini- stroke. No recent episodes of slurred speech, or temporary blindness.  Cardiac: No recent episodes of chest pain/pressure, no shortness of breath at rest.  + shortness of breath with exertion.  Denies history of atrial fibrillation or irregular heartbeat  Vascular: No history of rest pain in feet.  No history of claudication.  + history of non-healing ulcer, No history of DVT   Pulmonary: No home oxygen, no productive cough, no hemoptysis,  No asthma or wheezing  Musculoskeletal:  [ ]  Arthritis, [X]  Low back pain,  [X]  Joint pain  Hematologic:No history of hypercoagulable state.  No history of easy bleeding.  No history of anemia  Gastrointestinal: No hematochezia or melena,  No gastroesophageal reflux, no trouble swallowing  Urinary: [ ]  chronic Kidney disease, [ ]  on HD - [ ]  MWF or [ ]  TTHS, [ ]  Burning with urination, [ ]  Frequent urination, [ ]  Difficulty urinating;   Skin: No rashes  Psychological: No history of anxiety,  No history of depression   Physical Examination  Vitals:   11/20/16 1258  BP: 114/74  Pulse: 85  Resp: 16  Temp: (!) 97.2 F (36.2 C)  TempSrc: Oral  SpO2: 93%  Weight: (!) 389 lb (176.4 kg)  Height: 5\' 8"  (1.727 m)    Body mass index is 59.15 kg/m.  General:  Alert and oriented, no acute distress HEENT: Normal Neck: No bruit or JVD Pulmonary: Clear to  auscultation bilaterally Cardiac: Regular Rate and Rhythm without murmur Abdomen: Soft, non-tender, non-distended, no mass, Obese Skin: No rash, multiple scattered 6 mm ulcerations in various stages of healing pretibial bilaterally less than 1 mm depth brawny skin discoloration gaiter area circumferentially bilaterally Extremity Pulses:  2+ radial, brachial, femoral, dorsalis pedis pulses bilaterally Musculoskeletal: No deformity trace pretibial edema  Neurologic: Upper and lower extremity motor 5/5 and symmetric  DATA:  Patient had a venous reflux exam today. This showed evidence of reflux in the greater saphenous vein and saphenofemoral junction bilaterally. He  had mild popliteal vein deep vein reflux on the left side. Greater saphenous vein diameter was 5-7 mm diameter bilaterally.  ASSESSMENT:  #1 morbid obesity. I discussed the patient today the 75% of vein problems can be improved with weight loss alone. We discussed several techniques for weight loss including dietary changes and exercise. Also discussed with him the possibility of weight loss reduction surgery. He will consider this.  Bilateral lower extremity superficial venous reflux with dilation of the greater saphenous vein and multiple episodes of recurrent cellulitis ulceration. I discussed with the patient today bilateral lower extremity compression stockings thigh-high. He will try these for several months to see if he gets improvement of symptoms. Otherwise we will consider him for laser ablation of his greater saphenous bilaterally.   Fabienne Brunsharles Fields, MD [facility] Office: [phone] Pager: [phone]

## 2016-11-27 ENCOUNTER — Other Ambulatory Visit: Payer: Self-pay | Admitting: Pediatrics

## 2016-11-27 DIAGNOSIS — M549 Dorsalgia, unspecified: Principal | ICD-10-CM

## 2016-11-27 DIAGNOSIS — G8929 Other chronic pain: Secondary | ICD-10-CM

## 2016-11-29 ENCOUNTER — Other Ambulatory Visit: Payer: Self-pay | Admitting: Pediatrics

## 2016-11-29 DIAGNOSIS — G8929 Other chronic pain: Secondary | ICD-10-CM

## 2016-11-29 DIAGNOSIS — M549 Dorsalgia, unspecified: Principal | ICD-10-CM

## 2016-12-12 ENCOUNTER — Ambulatory Visit (INDEPENDENT_AMBULATORY_CARE_PROVIDER_SITE_OTHER): Payer: Medicare Other | Admitting: Pediatrics

## 2016-12-12 ENCOUNTER — Encounter: Payer: Self-pay | Admitting: Pediatrics

## 2016-12-12 VITALS — BP 133/84 | HR 87 | Temp 97.0°F | Ht 68.0 in | Wt 388.0 lb

## 2016-12-12 DIAGNOSIS — G8929 Other chronic pain: Secondary | ICD-10-CM | POA: Diagnosis not present

## 2016-12-12 DIAGNOSIS — F329 Major depressive disorder, single episode, unspecified: Secondary | ICD-10-CM | POA: Diagnosis not present

## 2016-12-12 DIAGNOSIS — M7989 Other specified soft tissue disorders: Secondary | ICD-10-CM | POA: Diagnosis not present

## 2016-12-12 DIAGNOSIS — M549 Dorsalgia, unspecified: Secondary | ICD-10-CM | POA: Diagnosis not present

## 2016-12-12 DIAGNOSIS — F32A Depression, unspecified: Secondary | ICD-10-CM

## 2016-12-12 MED ORDER — PHENTERMINE HCL 37.5 MG PO CAPS: 37.5000 mg | ORAL_CAPSULE | ORAL | 2 refills | Status: DC

## 2016-12-12 MED ORDER — ACETAMINOPHEN-CODEINE #3 300-30 MG PO TABS: 1.0000 | ORAL_TABLET | Freq: Every evening | ORAL | 2 refills | Status: DC | PRN

## 2016-12-12 MED ORDER — ESCITALOPRAM OXALATE 10 MG PO TABS: 10.0000 mg | ORAL_TABLET | Freq: Every day | ORAL | 5 refills | Status: DC

## 2016-12-12 MED ORDER — BUPROPION HCL ER (XL) 150 MG PO TB24: 150.0000 mg | ORAL_TABLET | Freq: Every day | ORAL | 5 refills | Status: DC

## 2016-12-12 NOTE — Progress Notes (Signed)
  Subjective:   Patient ID: Justin Schmidt, male    DOB: [birth year], 36 y.o.   MRN: [id number] CC: Follow-up (3 month) weight loss and back pain HPI: Justin Schmidt is a 36 y.o. male presenting for Follow-up (3 month)  Depression: feeling better Has been to the gym a couple times this week, excercising regular helps improve his mood a lot  Weight loss: cutting back on intake Still drinking sodas daily to help keep him awake in the morning he says Drinks EtOH apprx 1-2 times a week, 1-2 drinks  Pain assessment: Cause of pain- low back pain, arthritis Pain location- almost every day, cold rainy days make the pain worse Pain on scale of 1-10- sometimes 10/10 and can't get out of bed Frequency- almost every day What increases pain- walking, activity What makes pain Better-usually rest Effects on ADL - helps keeps him active Any change in general medical condition- no  Current medications- tylenol #3 Effectiveness of current meds- helps him to get comfortable at night at times, also helps keep him active, doing ADLs takes when needed Adverse reactions form pain meds-none  Urine drug screen- Yes Was the [facility] reviewed- yes  If yes were their any concerning findings? - no  Relevant past medical, surgical, family and social history reviewed. Allergies and medications reviewed and updated. History  Smoking Status  . Never Smoker  Smokeless Tobacco  . Never Used   ROS: Per HPI   Objective:    BP 133/84   Pulse 87   Temp (!) 97 F (36.1 C) (Oral)   Ht 5\' 8"  (1.727 m)   Wt (!) 388 lb (176 kg)   BMI 59.00 kg/m   Wt Readings from Last 3 Encounters:  12/12/16 (!) 388 lb (176 kg)  11/20/16 (!) 389 lb (176.4 kg)  09/24/16 (!) 388 lb (176 kg)    Gen: NAD, alert, cooperative with exam, NCAT EYES: EOMI, no conjunctival injection, or no icterus ENT:  OP without erythema CV: NRRR, normal S1/S2, no murmur, distal pulses 2+ b/l Resp: CTABL, no wheezes, normal WOB Abd: +BS, soft,  NTND. no guarding or organomegaly Ext: No pitting edema, warm Neuro: Alert and oriented MSK: normal muscle bulk Skin: venous stasis changes b/l lower legs  Assessment & Plan:  Justin Schmidt was seen today for follow-up med problems  Diagnoses and all orders for this visit:  Leg swelling Cont compression hose, weight loss  Chronic back pain, unspecified back location, unspecified back pain laterality Cont below, takes apprx 1x/day Helps him get through ADLs -     acetaminophen-codeine (TYLENOL #3) 300-30 MG tablet; Take 1 tablet by mouth at bedtime as needed for moderate pain.  Severe obesity (BMI >= 40) (HCC) Cont to encourage weight loss, discussed lifestyle changes, decreasing sodas Agrees to see nutrition for further assistance -     buPROPion (WELLBUTRIN XL) 150 MG 24 hr tablet; Take 1 tablet (150 mg total) by mouth daily. -     phentermine 37.5 MG capsule; Take 1 capsule (37.5 mg total) by mouth every morning. -     Amb ref to Medical Nutrition Therapy-MNT  Depression, unspecified depression type Improving, exercise helps, thinks lexapro has been helpful as well Cont below -     escitalopram (LEXAPRO) 10 MG tablet; Take 1 tablet (10 mg total) by mouth daily.   Follow up plan: Return in about 3 months (around 03/14/2017). Rex Kras, MD [facility]

## 2016-12-15 ENCOUNTER — Ambulatory Visit: Payer: Medicare Other | Admitting: Pediatrics

## 2017-01-02 ENCOUNTER — Telehealth: Payer: Self-pay | Admitting: Pediatrics

## 2017-01-02 NOTE — Telephone Encounter (Signed)
What is the name of the medication? acetaminophen-codeine (TYLENOL #3) 300-30 MG tablet  Have you contacted your pharmacy to request a refill? Yes was told to contact pcp  Which pharmacy would you like this sent to? [facility]   Patient notified that their request is being sent to the clinical staff for review and that they should receive a call once it is complete. If they do not receive a call within 24 hours they can check with their pharmacy or our office.

## 2017-01-14 ENCOUNTER — Ambulatory Visit: Payer: Medicare Other | Admitting: Nutrition

## 2017-01-22 NOTE — Telephone Encounter (Signed)
NA _ narcotics must be filled in an appt. - note to be closed

## 2017-01-28 ENCOUNTER — Encounter (HOSPITAL_COMMUNITY): Payer: Self-pay | Admitting: *Deleted

## 2017-01-28 ENCOUNTER — Inpatient Hospital Stay (HOSPITAL_COMMUNITY)
Admission: EM | Admit: 2017-01-28 | Discharge: 2017-02-01 | DRG: 176 | Disposition: A | Discharge status: Home / Self Care | Payer: Medicare Other | Attending: Internal Medicine | Admitting: Internal Medicine

## 2017-01-28 ENCOUNTER — Ambulatory Visit (INDEPENDENT_AMBULATORY_CARE_PROVIDER_SITE_OTHER): Payer: Medicare Other | Admitting: Physician Assistant

## 2017-01-28 ENCOUNTER — Telehealth: Payer: Self-pay | Admitting: Pediatrics

## 2017-01-28 ENCOUNTER — Emergency Department (HOSPITAL_COMMUNITY): Payer: Medicare Other

## 2017-01-28 ENCOUNTER — Encounter: Payer: Self-pay | Admitting: Physician Assistant

## 2017-01-28 DIAGNOSIS — E785 Hyperlipidemia, unspecified: Secondary | ICD-10-CM | POA: Diagnosis present

## 2017-01-28 DIAGNOSIS — R079 Chest pain, unspecified: Secondary | ICD-10-CM | POA: Diagnosis not present

## 2017-01-28 DIAGNOSIS — Z833 Family history of diabetes mellitus: Secondary | ICD-10-CM | POA: Diagnosis not present

## 2017-01-28 DIAGNOSIS — Z8349 Family history of other endocrine, nutritional and metabolic diseases: Secondary | ICD-10-CM | POA: Diagnosis not present

## 2017-01-28 DIAGNOSIS — G8929 Other chronic pain: Secondary | ICD-10-CM | POA: Diagnosis present

## 2017-01-28 DIAGNOSIS — K760 Fatty (change of) liver, not elsewhere classified: Secondary | ICD-10-CM | POA: Diagnosis present

## 2017-01-28 DIAGNOSIS — E781 Pure hyperglyceridemia: Secondary | ICD-10-CM | POA: Diagnosis present

## 2017-01-28 DIAGNOSIS — Z8249 Family history of ischemic heart disease and other diseases of the circulatory system: Secondary | ICD-10-CM | POA: Diagnosis not present

## 2017-01-28 DIAGNOSIS — F329 Major depressive disorder, single episode, unspecified: Secondary | ICD-10-CM | POA: Diagnosis present

## 2017-01-28 DIAGNOSIS — F339 Major depressive disorder, recurrent, unspecified: Secondary | ICD-10-CM | POA: Diagnosis present

## 2017-01-28 DIAGNOSIS — Z79899 Other long term (current) drug therapy: Secondary | ICD-10-CM | POA: Diagnosis not present

## 2017-01-28 DIAGNOSIS — Z791 Long term (current) use of non-steroidal anti-inflammatories (NSAID): Secondary | ICD-10-CM

## 2017-01-28 DIAGNOSIS — J9811 Atelectasis: Secondary | ICD-10-CM | POA: Diagnosis present

## 2017-01-28 DIAGNOSIS — R0789 Other chest pain: Secondary | ICD-10-CM | POA: Diagnosis not present

## 2017-01-28 DIAGNOSIS — Z6841 Body Mass Index (BMI) 40.0 and over, adult: Secondary | ICD-10-CM

## 2017-01-28 DIAGNOSIS — M549 Dorsalgia, unspecified: Secondary | ICD-10-CM | POA: Diagnosis present

## 2017-01-28 DIAGNOSIS — I2609 Other pulmonary embolism with acute cor pulmonale: Secondary | ICD-10-CM

## 2017-01-28 DIAGNOSIS — I2699 Other pulmonary embolism without acute cor pulmonale: Secondary | ICD-10-CM | POA: Diagnosis not present

## 2017-01-28 DIAGNOSIS — I2692 Saddle embolus of pulmonary artery without acute cor pulmonale: Secondary | ICD-10-CM | POA: Diagnosis present

## 2017-01-28 DIAGNOSIS — M79662 Pain in left lower leg: Secondary | ICD-10-CM | POA: Diagnosis present

## 2017-01-28 DIAGNOSIS — R0609 Other forms of dyspnea: Secondary | ICD-10-CM | POA: Diagnosis not present

## 2017-01-28 DIAGNOSIS — Z9049 Acquired absence of other specified parts of digestive tract: Secondary | ICD-10-CM

## 2017-01-28 DIAGNOSIS — E7439 Other disorders of intestinal carbohydrate absorption: Secondary | ICD-10-CM | POA: Diagnosis present

## 2017-01-28 LAB — BASIC METABOLIC PANEL
Anion gap: 9 (ref 5–15)
BUN: 14 mg/dL (ref 6–20)
CHLORIDE: 101 mmol/L (ref 101–111)
CO2: 27 mmol/L (ref 22–32)
Calcium: 9.2 mg/dL (ref 8.9–10.3)
Creatinine, Ser: 1.07 mg/dL (ref 0.61–1.24)
GFR calc Af Amer: >60 mL/min (ref >60)
GFR calc non Af Amer: >60 mL/min (ref >60)
GLUCOSE: 132 mg/dL — AB (ref 65–99)
Potassium: 3.8 mmol/L (ref 3.5–5.1)
Sodium: 137 mmol/L (ref 135–145)

## 2017-01-28 LAB — CBC
HCT: 41.2 % (ref 39.0–52.0)
Hemoglobin: 14 g/dL (ref 13.0–17.0)
MCH: 31.4 pg (ref 26.0–34.0)
MCHC: 34 g/dL (ref 30.0–36.0)
MCV: 92.4 fL (ref 78.0–100.0)
Platelets: 241 10*3/uL (ref 150–400)
RBC: 4.46 MIL/uL (ref 4.22–5.81)
RDW: 13.1 % (ref 11.5–15.5)
WBC: 7.1 10*3/uL (ref 4.0–10.5)

## 2017-01-28 LAB — PROTIME-INR
INR: 0.96
Prothrombin Time: 12.7 seconds (ref 11.4–15.2)

## 2017-01-28 LAB — TROPONIN I: Troponin I: <0.03 ng/mL (ref <0.03)

## 2017-01-28 LAB — D-DIMER, QUANTITATIVE: D-Dimer, Quant: 2.56 ug/mL-FEU — ABNORMAL HIGH (ref 0.00–0.50)

## 2017-01-28 LAB — APTT: aPTT: 33 seconds (ref 24–36)

## 2017-01-28 MED ORDER — IOPAMIDOL (ISOVUE-370) INJECTION 76%
100.0000 mL | Freq: Once | INTRAVENOUS | Status: AC | PRN
  Administered 2017-01-28: 100 mL via INTRAVENOUS

## 2017-01-28 MED ORDER — HEPARIN (PORCINE) IN NACL 100-0.45 UNIT/ML-% IJ SOLN
2500.0000 [IU]/h | INTRAMUSCULAR | Status: DC
  Administered 2017-01-28: 1900 [IU]/h via INTRAVENOUS
  Administered 2017-01-29: 2350 [IU]/h via INTRAVENOUS
  Administered 2017-01-29: 2150 [IU]/h via INTRAVENOUS
  Administered 2017-01-30: 2500 [IU]/h via INTRAVENOUS
  Administered 2017-01-30: 2350 [IU]/h via INTRAVENOUS
  Administered 2017-01-31 – 2017-02-01 (×3): 2500 [IU]/h via INTRAVENOUS
  Filled 2017-01-28 (×7): qty 250

## 2017-01-28 MED ORDER — HEPARIN SODIUM (PORCINE) 5000 UNIT/ML IJ SOLN: 4000.0000 [IU] | Freq: Once | INTRAMUSCULAR | Status: DC

## 2017-01-28 MED ORDER — HEPARIN (PORCINE) IN NACL 100-0.45 UNIT/ML-% IJ SOLN
14.0000 [IU]/kg/h | Freq: Once | INTRAMUSCULAR | Status: DC
  Filled 2017-01-28: qty 250

## 2017-01-28 MED ORDER — HEPARIN BOLUS VIA INFUSION
5000.0000 [IU] | Freq: Once | INTRAVENOUS | Status: AC
  Administered 2017-01-28: 5000 [IU] via INTRAVENOUS

## 2017-01-28 NOTE — ED Notes (Signed)
388 lb is correct weight, confirmed with pt

## 2017-01-28 NOTE — Progress Notes (Signed)
Pt came in for SOB Pt states never experienced SOB like this Worse with any exertion Also having L calf pain Per Prudy Feeler, pt needs to go to ER

## 2017-01-28 NOTE — ED Provider Notes (Addendum)
The patient turned over to me by the off going swimming emergency physician. CT scan shows significant PE. Patient with swelling to the left leg. There was concern for clinically for DVT. Patient also had intermittent chest pain and elevated d-dimer so CT angios done confirming the PE.  Results for orders placed or performed during the hospital encounter of 01/28/17  Basic metabolic panel  Result Value Ref Range   Sodium 137 135 - 145 mmol/L   Potassium 3.8 3.5 - 5.1 mmol/L   Chloride 101 101 - 111 mmol/L   CO2 27 22 - 32 mmol/L   Glucose, Bld 132 (H) 65 - 99 mg/dL   BUN 14 6 - 20 mg/dL   Creatinine, Ser 7.82 0.61 - 1.24 mg/dL   Calcium 9.2 8.9 - 95.6 mg/dL   GFR calc non Af Amer >60 >60 mL/min   GFR calc Af Amer >60 >60 mL/min   Anion gap 9 5 - 15  CBC  Result Value Ref Range   WBC 7.1 4.0 - 10.5 K/uL   RBC 4.46 4.22 - 5.81 MIL/uL   Hemoglobin 14.0 13.0 - 17.0 g/dL   HCT 21.3 08.6 - 57.8 %   MCV 92.4 78.0 - 100.0 fL   MCH 31.4 26.0 - 34.0 pg   MCHC 34.0 30.0 - 36.0 g/dL   RDW 46.9 62.9 - 52.8 %   Platelets 241 150 - 400 K/uL  D-dimer, quantitative (not at [facility])  Result Value Ref Range   D-Dimer, Quant 2.56 (H) 0.00 - 0.50 ug/mL-FEU  Troponin I  Result Value Ref Range   Troponin I <0.03 <0.03 ng/mL  Troponin I  Result Value Ref Range   Troponin I <0.03 <0.03 ng/mL  APTT  Result Value Ref Range   aPTT 33 24 - 36 seconds  Protime-INR  Result Value Ref Range   Prothrombin Time 12.7 11.4 - 15.2 seconds   INR 0.96    Dg Chest 2 View  Result Date: 01/28/2017 CLINICAL DATA:  Chest pain EXAM: CHEST  2 VIEW COMPARISON:  01/21/2012 FINDINGS: Streaky atelectasis at the lingula and left base. No consolidation or effusion. Normal cardiomediastinal silhouette. No pneumothorax. Mild scoliosis of the spine. Surgical clips in the upper abdomen IMPRESSION: Mild streaky atelectasis at the lingula and left base Electronically Signed   By: Jasmine Pang M.D.   On: 01/28/2017 17:06   Ct  Angio Chest Pe W And/or Wo Contrast  Result Date: 01/28/2017 CLINICAL DATA:  Anterior chest pain for 2 weeks EXAM: CT ANGIOGRAPHY CHEST WITH CONTRAST TECHNIQUE: Multidetector CT imaging of the chest was performed using the standard protocol during bolus administration of intravenous contrast. Multiplanar CT image reconstructions and MIPs were obtained to evaluate the vascular anatomy. CONTRAST:  100 mL Isovue 370 intravenous COMPARISON:  Radiograph 01/28/2017 FINDINGS: Cardiovascular: Satisfactory opacification of the pulmonary arteries to the segmental level. Filling defect within the distal right pulmonary artery with thrombus extending into right upper and lower lobe pulmonary artery. Multiple filling defects visualized within segmental and subsegmental branches of the right upper, right middle, and right lower lobes. Additional filling defects are visualized within distal segmental/subsegmental left upper lobe and left lower lobe branches consistent with emboli. RV LV ratio meet criteria for right heart strain at 0.9. No pericardial effusion.  Nonaneurysmal aorta. Mediastinum/Nodes: Midline trachea. No thyroid mass. Nonspecific subcentimeter mediastinal lymph nodes. Lungs/Pleura: Lungs are clear. No pleural effusion or pneumothorax. Upper Abdomen: Hepatic steatosis. Surgical clips at the gallbladder fossa Musculoskeletal: No chest Brill abnormality.  No acute or significant osseous findings. Review of the MIP images confirms the above findings. IMPRESSION: 1. Acute pulmonary emboli involving the distal right main pulmonary artery with extension into multiple right upper, middle, and lower lobe pulmonary vessels in addition to multiple distal segmental and subsegmental emboli within the left upper and lower lobes. Positive for acute PE with CT evidence of right heart strain (RV/LV Ratio = 0.9) consistent with at least submassive (intermediate risk) PE. The presence of right heart strain has been associated with  an increased risk of morbidity and mortality. Please activate Code PE by paging [phone]. Critical Value/emergent results were called by telephone at the time of interpretation on 01/28/2017 at 10:44 pm to Dr. Doug Sou , who verbally acknowledged these results. Electronically Signed   By: Jasmine Pang M.D.   On: 01/28/2017 22:44     Patient with sniffing P was some right heart strain. Discussed with critical care not a candidate for thrombolyze his. Recommend heparin. And workup for why this occurred. Patient clinically stable not hypoxic not tachycardic no hypotension. Patient very comfortable. We'll discuss with hospitalist. Patient started on heparin.   Vanetta Mulders, MD 01/28/17 2324   CRITICAL CARE Performed by: Vanetta Mulders Total critical care time: 30 minutes Critical care time was exclusive of separately billable procedures and treating other patients. Critical care was necessary to treat or prevent imminent or life-threatening deterioration. Critical care was time spent personally by me on the following activities: development of treatment plan with patient and/or surrogate as well as nursing, discussions with consultants, evaluation of patient's response to treatment, examination of patient, obtaining history from patient or surrogate, ordering and performing treatments and interventions, ordering and review of laboratory studies, ordering and review of radiographic studies, pulse oximetry and re-evaluation of patient's condition.   Vanetta Mulders, MD 01/28/17 2325    Vanetta Mulders, MD 01/28/17 2326

## 2017-01-28 NOTE — Progress Notes (Signed)
ANTICOAGULATION CONSULT NOTE - Preliminary  Pharmacy Consult for Heparin Indication: Pulmonary embolism  No Known Allergies  Patient Measurements: Height:  (175.3 cm) Weight: (!) 388 lb 8 oz (176.2 kg) IBW/kg (Calculated) : 70.7 HEPARIN DW (KG): 114.7   Vital Signs: Temp: 98.1 F (36.7 C) (10/03 1954) Temp Source: Oral (10/03 1954) BP: 125/63 (10/03 2200) Pulse Rate: 88 (10/03 2200)  Labs:  Recent Labs  01/28/17 1910  HGB 14.0  HCT 41.2  PLT 241  CREATININE 1.07  TROPONINI <0.03   Estimated Creatinine Clearance: 152.4 mL/min (by C-G formula based on SCr of 1.07 mg/dL).  Medical History: Past Medical History:  Diagnosis Date  . Back pain   . Gallstones   . Hyperlipidemia   . Pre-diabetes     Medications:   Assessment: 36 yo male with history of anterior chest pain x 2 weeks. Acute bilateral pulmonary emboli seen on CT. Pharmacy has been asked to dose IV heparin.  Goal of Therapy:  Heparin goal level: 0.3-0.7 units/ml Monitor platelets by anticoagulation protocol: Yes   Plan:  Heparin 5000 unit IV bolus Heparin infusion at 1900 units/hr Heparin level in 6-8 hours  Preliminary review of pertinent patient information completed.  [name] clinical pharmacist will complete review during morning rounds to assess the patient and finalize treatment regimen.  Justin Schmidt, [facility] 01/28/2017,11:11 PM

## 2017-01-28 NOTE — H&P (Addendum)
TRH H&P   Patient Demographics:    Justin Schmidt, is a 36 y.o. male  MRN: [id number]   DOB - 1980/07/11  Admit Date - 01/28/2017  Outpatient Primary MD for the patient is Johna Sheriff, MD  Referring MD/NP/PA:  Idalia Needle  Outpatient Specialists:  Patient coming from: home  Chief Complaint  Patient presents with  . Chest Pain      HPI:    Justin Schmidt  is a 36 y.o. male, w morbid obeisity, glucose intolerance, hyperlipidemia, apparently presented w dyspnea x 1 week. Denies fever, chills, cp, palp, n/v, diarrhea, brbpr.  Pt presented to ED for evaluation of dyspnea. Pt denies family hx of blood clots, no recent travel.   In ED,   Pt had CXR => mild streaky atelectasis.  CTA chest =>  IMPRESSION: 1. Acute pulmonary emboli involving the distal right main pulmonary artery with extension into multiple right upper, middle, and lower lobe pulmonary vessels in addition to multiple distal segmental and subsegmental emboli within the left upper and lower lobes. Positive for acute PE with CT evidence of right heart strain (RV/LV Ratio = 0.9) consistent with at least submassive (intermediate risk) PE. The presence of right heart strain has been associated with an increased risk of morbidity and mortality. Please activate Code PE by paging [phone].  ED spoke with PCCM who thought that TPA not needed, and recommended admission at [facility] hospital.     Review of systems:    In addition to the HPI above,  No Fever-chills, No Headache, No changes with Vision or hearing, No problems swallowing food or Liquids, No  Cough , + chest pain earlier in the week.  No Abdominal pain, No Nausea or Vommitting, Bowel movements are regular, No Blood in stool or Urine, No dysuria, No new skin rashes or bruises, No new joints pains-aches,  No new weakness, tingling, numbness in any  extremity, No recent weight gain or loss, No polyuria, polydypsia or polyphagia, No significant Mental Stressors.  A full 10 point Review of Systems was done, except as stated above, all other Review of Systems were negative.   With Past History of the following :    Past Medical History:  Diagnosis Date  . Back pain   . Gallstones   . Hyperlipidemia   . Pre-diabetes       Past Surgical History:  Procedure Laterality Date  . APPENDECTOMY  1991  . CHOLECYSTECTOMY  01/23/2012   Procedure: LAPAROSCOPIC CHOLECYSTECTOMY;  Surgeon: Fabio Bering, MD;  Location: [facility];  Service: General;  Laterality: N/A;  . TONSILLECTOMY        Social History:     Social History  Substance Use Topics  . Smoking status: Never Smoker  . Smokeless tobacco: Never Used  . Alcohol use 0.6 oz/week    1 Cans of beer per week  Comment: very rare     Lives - at home  Mobility - walks by self   Family History :     Family History  Problem Relation Age of Onset  . Diabetes Mother   . Diabetes Father   . Hypertension Father   . Hyperlipidemia Father   . Migraines Sister   . Diabetes Maternal Uncle   . Diabetes Maternal Grandmother   . Diabetes Other       Home Medications:   Prior to Admission medications   Medication Sig Start Date End Date Taking? Authorizing Provider  acetaminophen-codeine (TYLENOL #3) 300-30 MG tablet Take 1 tablet by mouth at bedtime as needed for moderate pain. 12/12/16  Yes Johna Sheriff, MD  Ascorbic Acid (VITAMIN C) 1000 MG tablet Take 1,000 mg by mouth daily.   Yes [provider]  Cholecalciferol (VITAMIN D) 2000 UNITS tablet Take 2,000 Units by mouth daily.   Yes [provider]  fluticasone (FLONASE) 50 MCG/ACT nasal spray Place 2 sprays into both nostrils daily. 03/20/15  Yes Johna Sheriff, MD  ibuprofen (ADVIL,MOTRIN) 200 MG tablet Take 200 mg by mouth every 6 (six) hours as needed for mild pain or moderate pain.   Yes  [provider]  Misc Natural Products (GREEN TEA) TABS Take 315 mg by mouth daily. 05/12/14  Yes Henrene Pastor, PharmD  Multiple Vitamin (MULTIVITAMIN WITH MINERALS) TABS Take 1 tablet by mouth daily.   Yes [provider]  buPROPion (WELLBUTRIN XL) 150 MG 24 hr tablet Take 1 tablet (150 mg total) by mouth daily. Patient not taking: Reported on 01/28/2017 12/12/16   Johna Sheriff, MD  escitalopram (LEXAPRO) 10 MG tablet Take 1 tablet (10 mg total) by mouth daily. Patient not taking: Reported on 01/28/2017 12/12/16   Johna Sheriff, MD  phentermine 37.5 MG capsule Take 1 capsule (37.5 mg total) by mouth every morning. Patient not taking: Reported on 01/28/2017 12/12/16   Johna Sheriff, MD     Allergies:    No Known Allergies   Physical Exam:   Vitals  Blood pressure 115/62, pulse 86, temperature 98.1 F (36.7 C), temperature source Oral, resp. rate (!) 23, height  (1.753 m), weight (!) 176.2 kg (388 lb 8 oz), SpO2 95 %.   1. General  lying in bed in NAD,   2. Normal affect and insight, Not Suicidal or Homicidal, Awake Alert, Oriented X 3.  3. No F.N deficits, ALL C.Nerves Intact, Strength 5/5 all 4 extremities, Sensation intact all 4 extremities, Plantars down going.  4. Ears and Eyes appear Normal, Conjunctivae clear, PERRLA. Moist Oral Mucosa.  5. Supple Neck, No JVD, No cervical lymphadenopathy appriciated, No Carotid Bruits.  6. Symmetrical Chest Ressel movement, Good air movement bilaterally, CTAB.  7. RRR, No Gallops, Rubs or Murmurs, No Parasternal Heave.  8. Positive Bowel Sounds, Abdomen Soft, No tenderness, No organomegaly appriciated,No rebound -guarding or rigidity.  9.  No Cyanosis, Normal Skin Turgor, No Skin Rash or Bruise.  10. Good muscle tone,  joints appear normal , no effusions, Normal ROM.  11. No Palpable Lymph Nodes in Neck or Axillae     Data Review:    CBC  Recent Labs Lab 01/28/17 1910  WBC 7.1  HGB 14.0  HCT 41.2   PLT 241  MCV 92.4  MCH 31.4  MCHC 34.0  RDW 13.1   ------------------------------------------------------------------------------------------------------------------  Chemistries   Recent Labs Lab 01/28/17 1910  NA 137  K 3.8  CL 101  CO2 27  GLUCOSE 132*  BUN 14  CREATININE 1.07  CALCIUM 9.2   ------------------------------------------------------------------------------------------------------------------ estimated creatinine clearance is 152.4 mL/min (by C-G formula based on SCr of 1.07 mg/dL). ------------------------------------------------------------------------------------------------------------------ No results for input(s): TSH, T4TOTAL, T3FREE, THYROIDAB in the last 72 hours.  Invalid input(s): FREET3  Coagulation profile  Recent Labs Lab 01/28/17 1944  INR 0.96   -------------------------------------------------------------------------------------------------------------------  Recent Labs  01/28/17 1910  DDIMER 2.56*   -------------------------------------------------------------------------------------------------------------------  Cardiac Enzymes  Recent Labs Lab 01/28/17 1910 01/28/17 2241  TROPONINI <0.03 <0.03   ------------------------------------------------------------------------------------------------------------------ No results found for: BNP   ---------------------------------------------------------------------------------------------------------------  Urinalysis    Component Value Date/Time   COLORURINE YELLOW 01/21/2012 2342   APPEARANCEUR CLEAR 01/21/2012 2342   LABSPEC 1.025 01/21/2012 2342   PHURINE 5.5 01/21/2012 2342   GLUCOSEU NEGATIVE 01/21/2012 2342   HGBUR NEGATIVE 01/21/2012 2342   BILIRUBINUR NEGATIVE 01/21/2012 2342   KETONESUR NEGATIVE 01/21/2012 2342   PROTEINUR NEGATIVE 01/21/2012 2342   UROBILINOGEN 0.2 01/21/2012 2342   NITRITE NEGATIVE 01/21/2012 2342   LEUKOCYTESUR NEGATIVE 01/21/2012 2342      ----------------------------------------------------------------------------------------------------------------   Imaging Results:    Dg Chest 2 View  Result Date: 01/28/2017 CLINICAL DATA:  Chest pain EXAM: CHEST  2 VIEW COMPARISON:  01/21/2012 FINDINGS: Streaky atelectasis at the lingula and left base. No consolidation or effusion. Normal cardiomediastinal silhouette. No pneumothorax. Mild scoliosis of the spine. Surgical clips in the upper abdomen IMPRESSION: Mild streaky atelectasis at the lingula and left base Electronically Signed   By: Jasmine Pang M.D.   On: 01/28/2017 17:06   Ct Angio Chest Pe W And/or Wo Contrast  Result Date: 01/28/2017 CLINICAL DATA:  Anterior chest pain for 2 weeks EXAM: CT ANGIOGRAPHY CHEST WITH CONTRAST TECHNIQUE: Multidetector CT imaging of the chest was performed using the standard protocol during bolus administration of intravenous contrast. Multiplanar CT image reconstructions and MIPs were obtained to evaluate the vascular anatomy. CONTRAST:  100 mL Isovue 370 intravenous COMPARISON:  Radiograph 01/28/2017 FINDINGS: Cardiovascular: Satisfactory opacification of the pulmonary arteries to the segmental level. Filling defect within the distal right pulmonary artery with thrombus extending into right upper and lower lobe pulmonary artery. Multiple filling defects visualized within segmental and subsegmental branches of the right upper, right middle, and right lower lobes. Additional filling defects are visualized within distal segmental/subsegmental left upper lobe and left lower lobe branches consistent with emboli. RV LV ratio meet criteria for right heart strain at 0.9. No pericardial effusion.  Nonaneurysmal aorta. Mediastinum/Nodes: Midline trachea. No thyroid mass. Nonspecific subcentimeter mediastinal lymph nodes. Lungs/Pleura: Lungs are clear. No pleural effusion or pneumothorax. Upper Abdomen: Hepatic steatosis. Surgical clips at the gallbladder fossa  Musculoskeletal: No chest Brackney abnormality. No acute or significant osseous findings. Review of the MIP images confirms the above findings. IMPRESSION: 1. Acute pulmonary emboli involving the distal right main pulmonary artery with extension into multiple right upper, middle, and lower lobe pulmonary vessels in addition to multiple distal segmental and subsegmental emboli within the left upper and lower lobes. Positive for acute PE with CT evidence of right heart strain (RV/LV Ratio = 0.9) consistent with at least submassive (intermediate risk) PE. The presence of right heart strain has been associated with an increased risk of morbidity and mortality. Please activate Code PE by paging [phone]. Critical Value/emergent results were called by telephone at the time of interpretation on 01/28/2017 at 10:44 pm to Dr. Doug Sou , who verbally acknowledged these results. Electronically Signed  By: Jasmine Pang M.D.   On: 01/28/2017 22:44   NSR at 90, nl axis, no st-t changes c/w ischemia    Assessment & Plan:    Principal Problem:   Pulmonary embolism (HCC)    PE Tele Trop I q6h x3 hypercoag panel Check cardiac echo Heparin iv  Depression Continue current medications.    DVT Prophylaxis Heparin -     AM Labs Ordered, also please review Full Orders  Family Communication: Admission, patients condition and plan of care including tests being ordered have been discussed with the patient  who indicate understanding and agree with the plan and Code Status.  Code Status FULL CODE  Likely DC to  home  Condition GUARDED    Consults called: pccm by ED  Admission status: inpatient  Time spent in minutes : 45   Pearson Grippe M.D on 01/28/2017 at 11:48 PM  Between 7am to 7pm - Pager - [phone]. After 7pm go to [email or web address] - password [email or web address]  [facility] - Office  [phone]

## 2017-01-28 NOTE — ED Notes (Signed)
Pt states he needs to have a BM; BSC to bedside and patient encouraged to call when finished

## 2017-01-28 NOTE — ED Provider Notes (Addendum)
[facility] DEPT Provider Note   CSN: [id number] Arrival date & time: 01/28/17  1631     History   Chief Complaint Chief Complaint  Patient presents with  . Chest Pain    HPI Justin Schmidt is a 36 y.o. male.Complains of anterior chest pain for the past 2 weeks. Pain lasts approximately a minute at a time is nonexertional. Other associated symptoms include dyspnea for the past 2 weeks which is worse with exertion and improved with rest. He also complains of left calf pain for the past 2-3 weeks which feels like a muscle pull. He states he regularly performs martial arts and feels as if he is pulled the muscles in his calf. No other associated symptoms no treatment prior to coming here  HPI  Past Medical History:  Diagnosis Date  . Back pain   . Gallstones   . Hyperlipidemia   . Pre-diabetes     Patient Active Problem List   Diagnosis Date Noted  . Pain management contract agreement 09/11/2016  . Chronic back pain 09/11/2016  . Depression 09/11/2016  . Fatty liver 05/17/2015  . Elevated transaminase level 05/17/2015  . Stress at home 05/17/2015  . Metabolic syndrome 02/01/2015  . Severe obesity (BMI >= 40) (HCC) 09/05/2013  . Hypertriglyceridemia 09/05/2013    Past Surgical History:  Procedure Laterality Date  . APPENDECTOMY  1991  . CHOLECYSTECTOMY  01/23/2012   Procedure: LAPAROSCOPIC CHOLECYSTECTOMY;  Surgeon: Fabio Bering, MD;  Location: [facility];  Service: General;  Laterality: N/A;  . TONSILLECTOMY         Home Medications    Prior to Admission medications   Medication Sig Start Date End Date Taking? Authorizing Provider  acetaminophen-codeine (TYLENOL #3) 300-30 MG tablet Take 1 tablet by mouth at bedtime as needed for moderate pain. 12/12/16   Johna Sheriff, MD  Arginine 500 MG CAPS Take 1 capsule by mouth daily.    [provider]  Ascorbic Acid (VITAMIN C) 1000 MG tablet Take 1,000 mg by mouth daily.    [provider]    buPROPion (WELLBUTRIN XL) 150 MG 24 hr tablet Take 1 tablet (150 mg total) by mouth daily. 12/12/16   Johna Sheriff, MD  Cholecalciferol (VITAMIN D) 2000 UNITS tablet Take 2,000 Units by mouth daily.    [provider]  Coenzyme Q10 100 MG TABS Take 1 tablet (100 mg total) by mouth daily. 05/12/14   Henrene Pastor, PharmD  escitalopram (LEXAPRO) 10 MG tablet Take 1 tablet (10 mg total) by mouth daily. 12/12/16   Johna Sheriff, MD  Flaxseed, Linseed, (FLAX SEED OIL PO) Take 1,200 mg by mouth daily.    [provider]  fluticasone (FLONASE) 50 MCG/ACT nasal spray Place 2 sprays into both nostrils daily. 03/20/15   Johna Sheriff, MD  furosemide (LASIX) 20 MG tablet Take 1 tablet (20 mg total) by mouth daily as needed. 11/21/15   Dettinger, Elige Radon, MD  Ginger, Zingiber officinalis, (GINGER ROOT PO) Take 1 capsule by mouth daily.    [provider]  IBU 800 MG tablet Take 1 Tablet by mouth every 8 hours as needed 10/02/16   Johna Sheriff, MD  Misc Natural Products (GREEN TEA) TABS Take 315 mg by mouth daily. 05/12/14   Henrene Pastor, PharmD  Multiple Vitamin (MULTIVITAMIN WITH MINERALS) TABS Take 1 tablet by mouth daily.    [provider]  Nutritional Supplements (DHEA PO) Take 50 mg by mouth daily. On  days after workout    [provider]  oxaprozin (DAYPRO) 600 MG tablet Take 600 mg by mouth daily.    [provider]  phentermine 37.5 MG capsule Take 1 capsule (37.5 mg total) by mouth every morning. 12/12/16   Johna Sheriff, MD  TURMERIC CURCUMIN PO Take 1 capsule by mouth daily.    [provider]    Family History Family History  Problem Relation Age of Onset  . Diabetes Mother   . Diabetes Father   . Hypertension Father   . Hyperlipidemia Father   . Migraines Sister   . Diabetes Maternal Uncle   . Diabetes Maternal Grandmother   . Diabetes Other     Social History Social History  Substance Use Topics  . Smoking  status: Never Smoker  . Smokeless tobacco: Never Used  . Alcohol use 0.6 oz/week    1 Cans of beer per week     Comment: very rare  Smoked 5 cigars in his life smoked 5 cigars in his life as a teenager, otherwise nonsmoker. No illicit drug use   Allergies   Patient has no known allergies.   Review of Systems Review of Systems  Constitutional: Negative.   HENT: Negative.   Respiratory: Positive for shortness of breath.   Cardiovascular: Positive for chest pain.  Gastrointestinal: Negative.   Musculoskeletal: Positive for myalgias.       Left calf pain  Skin: Negative.   Neurological: Negative.   Psychiatric/Behavioral: Negative.   All other systems reviewed and are negative.    Physical Exam Updated Vital Signs BP 131/69   Pulse 91   Temp 98.1 F (36.7 C) (Oral)   Resp 13   Ht  (1.753 m)   Wt (!) 176.2 kg (388 lb 8 oz)   SpO2 96%   BMI 57.37 kg/m   Physical Exam  Constitutional: He appears well-developed and well-nourished. No distress.  HENT:  Head: Normocephalic and atraumatic.  Eyes: Pupils are equal, round, and reactive to light. Conjunctivae are normal.  Neck: Neck supple. No tracheal deviation present. No thyromegaly present.  Cardiovascular: Normal rate, regular rhythm and normal heart sounds.   No murmur heard. Pulmonary/Chest: Effort normal and breath sounds normal.  Abdominal: Soft. Bowel sounds are normal. He exhibits no distension. There is no tenderness.  Obese  Musculoskeletal: Normal range of motion. He exhibits no edema or tenderness.  4 extremities without redness swelling or tenderness neurovascularly intact  Neurological: He is alert. Coordination normal.  Skin: Skin is warm and dry. No rash noted.  Psychiatric: He has a normal mood and affect.  Nursing note and vitals reviewed.    ED Treatments / Results  Labs (all labs ordered are listed, but only abnormal results are displayed) Labs Reviewed  BASIC METABOLIC PANEL  CBC    TROPONIN I  D-DIMER, QUANTITATIVE (NOT AT [facility])  TROPONIN I    EKG  EKG Interpretation  Date/Time:  Wednesday January 28 2017 16:42:18 EDT Ventricular Rate:  89 PR Interval:  144 QRS Duration: 82 QT Interval:  362 QTC Calculation: 440 R Axis:   76 Text Interpretation:  Normal sinus rhythm Normal ECG No significant change since last tracing Confirmed by Doug Sou [phone]) on 01/28/2017 7:29:05 PM       Radiology Dg Chest 2 View  Result Date: 01/28/2017 CLINICAL DATA:  Chest pain EXAM: CHEST  2 VIEW COMPARISON:  01/21/2012 FINDINGS: Streaky atelectasis at the lingula and left base. No consolidation or effusion.  Normal cardiomediastinal silhouette. No pneumothorax. Mild scoliosis of the spine. Surgical clips in the upper abdomen IMPRESSION: Mild streaky atelectasis at the lingula and left base Electronically Signed   By: Jasmine Pang M.D.   On: 01/28/2017 17:06    Procedures Procedures (including critical care time)  Medications Ordered in ED Medications - No data to display  Results for orders placed or performed during the hospital encounter of 01/28/17  Basic metabolic panel  Result Value Ref Range   Sodium 137 135 - 145 mmol/L   Potassium 3.8 3.5 - 5.1 mmol/L   Chloride 101 101 - 111 mmol/L   CO2 27 22 - 32 mmol/L   Glucose, Bld 132 (H) 65 - 99 mg/dL   BUN 14 6 - 20 mg/dL   Creatinine, Ser 4.09 0.61 - 1.24 mg/dL   Calcium 9.2 8.9 - 81.1 mg/dL   GFR calc non Af Amer >60 >60 mL/min   GFR calc Af Amer >60 >60 mL/min   Anion gap 9 5 - 15  CBC  Result Value Ref Range   WBC 7.1 4.0 - 10.5 K/uL   RBC 4.46 4.22 - 5.81 MIL/uL   Hemoglobin 14.0 13.0 - 17.0 g/dL   HCT 91.4 78.2 - 95.6 %   MCV 92.4 78.0 - 100.0 fL   MCH 31.4 26.0 - 34.0 pg   MCHC 34.0 30.0 - 36.0 g/dL   RDW 21.3 08.6 - 57.8 %   Platelets 241 150 - 400 K/uL  D-dimer, quantitative (not at [facility])  Result Value Ref Range   D-Dimer, Quant 2.56 (H) 0.00 - 0.50 ug/mL-FEU  Troponin I  Result Value Ref  Range   Troponin I <0.03 <0.03 ng/mL   Dg Chest 2 View  Result Date: 01/28/2017 CLINICAL DATA:  Chest pain EXAM: CHEST  2 VIEW COMPARISON:  01/21/2012 FINDINGS: Streaky atelectasis at the lingula and left base. No consolidation or effusion. Normal cardiomediastinal silhouette. No pneumothorax. Mild scoliosis of the spine. Surgical clips in the upper abdomen IMPRESSION: Mild streaky atelectasis at the lingula and left base Electronically Signed   By: Jasmine Pang M.D.   On: 01/28/2017 17:06   Initial Impression / Assessment and Plan / ED Course  I have reviewed the triage vital signs and the nursing notes.  Pertinent labs & imaging results that were available during my care of the patient were reviewed by me and considered in my medical decision making (see chart for details).     10:15 PM patientpt signed out to Dr Deretha Emory. Chest pain is atypical for acute coronary syndrome. Heart score equals 1 . CT angiogram of chest pending given elevated d-dimer and dyspnea on exertion. Patient may need DVT study due to left calf pain Final Clinical Impressions(s) / ED Diagnoses  Dx #1Atypical chest pain #2 dyspnea on exertion Final diagnoses:  None    New Prescriptions New Prescriptions   No medications on file     Doug Sou, MD 01/28/17 2222    Doug Sou, MD 01/28/17 2248

## 2017-01-28 NOTE — ED Triage Notes (Signed)
Pt c/o SOB x 1.5 weeks and mid CP x 2 days. Denies n/v, dizziness, weakness. Pt also c/o left calf pain x 2.5 weeks, worse with flexion of the foot.

## 2017-01-29 ENCOUNTER — Inpatient Hospital Stay (HOSPITAL_COMMUNITY): Payer: Medicare Other

## 2017-01-29 ENCOUNTER — Encounter (HOSPITAL_COMMUNITY): Payer: Self-pay | Admitting: Internal Medicine

## 2017-01-29 DIAGNOSIS — I2609 Other pulmonary embolism with acute cor pulmonale: Secondary | ICD-10-CM

## 2017-01-29 DIAGNOSIS — R0609 Other forms of dyspnea: Secondary | ICD-10-CM

## 2017-01-29 LAB — ECHOCARDIOGRAM COMPLETE
AO mean calculated velocity dopler: 95 cm/s
AOPV: 0.87 m/s
AOVTI: 27.1 cm
AV Area VTI index: 1.39 cm2/m2
AV Area VTI: 3.94 cm2
AV Area mean vel: 4.12 cm2
AV Mean grad: 4 mmHg
AV Peak grad: 9 mmHg
AV vel: 4.25
AVAREAMEANVIN: 1.34 cm2/m2
AVCELMEANRAT: 0.91
AVLVOTPG: 7 mmHg
AVPKVEL: 148 cm/s
CHL CUP AV PEAK INDEX: 1.28
CHL CUP AV VALUE AREA INDEX: 1.39
CHL CUP DOP CALC LVOT VTI: 25.5 cm
CHL CUP MV DEC (S): 275
E decel time: 275 msec
EERAT: 5.36
FS: 37 % (ref 28–44)
Height: 69 in
IV/PV OW: 1.23
LA ID, A-P, ES: 38 mm
LADIAMINDEX: 1.24 cm/m2
LAVOL: 60.4 mL
LAVOLA4C: 54.7 mL
LAVOLIN: 19.7 mL/m2
LEFT ATRIUM END SYS DIAM: 38 mm
LV E/e' medial: 5.36
LV E/e'average: 5.36
LV PW d: 11.5 mm — AB (ref 0.6–1.1)
LV TDI E'MEDIAL: 12.1
LVELAT: 15.8 cm/s
LVOT area: 4.52 cm2
LVOT diameter: 24 mm
LVOTPV: 129 cm/s
LVOTSV: 115 mL
LVOTVTI: 0.94 cm
Lateral S' vel: 18.8 cm/s
MV Peak grad: 3 mmHg
MV pk E vel: 84.7 m/s
MVPKAVEL: 69.2 m/s
TAPSE: 21.4 mm
TDI e' lateral: 15.8
Valve area: 4.25 cm2
Weight: 6335.14 oz

## 2017-01-29 LAB — CBC
HCT: 39.2 % (ref 39.0–52.0)
HEMOGLOBIN: 13.4 g/dL (ref 13.0–17.0)
MCH: 31.8 pg (ref 26.0–34.0)
MCHC: 34.2 g/dL (ref 30.0–36.0)
MCV: 92.9 fL (ref 78.0–100.0)
PLATELETS: 204 10*3/uL (ref 150–400)
RBC: 4.22 MIL/uL (ref 4.22–5.81)
RDW: 13.2 % (ref 11.5–15.5)
WBC: 8.1 10*3/uL (ref 4.0–10.5)

## 2017-01-29 LAB — COMPREHENSIVE METABOLIC PANEL
ALK PHOS: 54 U/L (ref 38–126)
ALT: 42 U/L (ref 17–63)
AST: 23 U/L (ref 15–41)
Albumin: 3.6 g/dL (ref 3.5–5.0)
Anion gap: 10 (ref 5–15)
BILIRUBIN TOTAL: 0.7 mg/dL (ref 0.3–1.2)
BUN: 14 mg/dL (ref 6–20)
CALCIUM: 8.9 mg/dL (ref 8.9–10.3)
CO2: 26 mmol/L (ref 22–32)
CREATININE: 1.17 mg/dL (ref 0.61–1.24)
Chloride: 101 mmol/L (ref 101–111)
GLUCOSE: 116 mg/dL — AB (ref 65–99)
POTASSIUM: 3.5 mmol/L (ref 3.5–5.1)
Sodium: 137 mmol/L (ref 135–145)
Total Protein: 6.9 g/dL (ref 6.5–8.1)

## 2017-01-29 LAB — MRSA PCR SCREENING: MRSA BY PCR: NEGATIVE

## 2017-01-29 LAB — HEPARIN LEVEL (UNFRACTIONATED)
HEPARIN UNFRACTIONATED: 0.27 [IU]/mL — AB (ref 0.30–0.70)
HEPARIN UNFRACTIONATED: 0.56 [IU]/mL (ref 0.30–0.70)
Heparin Unfractionated: 0.2 IU/mL — ABNORMAL LOW (ref 0.30–0.70)

## 2017-01-29 LAB — ANTITHROMBIN III: AntiThromb III Func: 83 % (ref 75–120)

## 2017-01-29 LAB — TROPONIN I
Troponin I: <0.03 ng/mL (ref <0.03)
Troponin I: <0.03 ng/mL (ref <0.03)

## 2017-01-29 MED ORDER — BUPROPION HCL ER (XL) 150 MG PO TB24
150.0000 mg | ORAL_TABLET | Freq: Every day | ORAL | Status: DC
  Administered 2017-01-29 – 2017-02-01 (×4): 150 mg via ORAL
  Filled 2017-01-29 (×7): qty 1

## 2017-01-29 MED ORDER — SODIUM CHLORIDE 0.9 % IV SOLN
INTRAVENOUS | Status: AC
  Administered 2017-01-29 (×2): via INTRAVENOUS

## 2017-01-29 MED ORDER — HEPARIN BOLUS VIA INFUSION
2000.0000 [IU] | Freq: Once | INTRAVENOUS | Status: AC
  Administered 2017-01-29: 2000 [IU] via INTRAVENOUS
  Filled 2017-01-29: qty 2000

## 2017-01-29 MED ORDER — ACETAMINOPHEN 650 MG RE SUPP: 650.0000 mg | Freq: Four times a day (QID) | RECTAL | Status: DC | PRN

## 2017-01-29 MED ORDER — ESCITALOPRAM OXALATE 10 MG PO TABS
10.0000 mg | ORAL_TABLET | Freq: Every day | ORAL | Status: DC
  Administered 2017-01-29 – 2017-02-01 (×4): 10 mg via ORAL
  Filled 2017-01-29 (×4): qty 1

## 2017-01-29 MED ORDER — PERFLUTREN LIPID MICROSPHERE
1.0000 mL | INTRAVENOUS | Status: AC | PRN
  Administered 2017-01-29: 2 mL via INTRAVENOUS
  Administered 2017-01-29: 1 mL via INTRAVENOUS
  Filled 2017-01-29: qty 10

## 2017-01-29 MED ORDER — ACETAMINOPHEN-CODEINE #3 300-30 MG PO TABS: 1.0000 | ORAL_TABLET | Freq: Every evening | ORAL | Status: DC | PRN

## 2017-01-29 MED ORDER — FLUTICASONE PROPIONATE 50 MCG/ACT NA SUSP
2.0000 | Freq: Every day | NASAL | Status: DC
  Administered 2017-01-29 – 2017-01-30 (×2): 2 via NASAL
  Filled 2017-01-29: qty 16

## 2017-01-29 MED ORDER — ACETAMINOPHEN 325 MG PO TABS
650.0000 mg | ORAL_TABLET | Freq: Four times a day (QID) | ORAL | Status: DC | PRN
  Administered 2017-01-29: 650 mg via ORAL
  Filled 2017-01-29: qty 2

## 2017-01-29 MED ORDER — ZOLPIDEM TARTRATE 5 MG PO TABS
10.0000 mg | ORAL_TABLET | Freq: Every evening | ORAL | Status: DC | PRN
  Administered 2017-01-29 – 2017-02-01 (×3): 10 mg via ORAL
  Filled 2017-01-29 (×3): qty 2

## 2017-01-29 NOTE — Progress Notes (Signed)
*  PRELIMINARY RESULTS* Echocardiogram 2D Echocardiogram has been performed with Definity.  Stacey Drain 01/29/2017, 3:51 PM

## 2017-01-29 NOTE — ED Notes (Signed)
Pt given a meal tray 

## 2017-01-29 NOTE — ED Notes (Signed)
Dr. Kim in with pt at this time 

## 2017-01-29 NOTE — Progress Notes (Signed)
PROGRESS NOTE                                                                                                                                                                                                             Patient Demographics:    Justin Schmidt, is a 36 y.o. male, DOB - [birth year], BJY:[id number]  Admit date - 01/28/2017   Admitting Physician Pearson Grippe, MD  Outpatient Primary MD for the patient is Johna Sheriff, MD  LOS - 1  Outpatient Specialists:None  Chief Complaint  Patient presents with  . Chest Pain       Brief Narrative   36 year old morbidly obese male with chronic back pain, hyperlipidemia with one week history of progressive dyspnea presented to the ED and was found to have submassive PE. Reported some chest tightness earlier in the course. Denies any fever, chills, nausea, vomiting, recent travel, prior history of blood clots or family history of blood clot or sudden cardiac death. Reports trying to be active in the forearm and going to gym frequently. Patient placed on IV heparin drip and admitted to stepdown unit. PC CM was consulted by ED physician who recommended patient does not need TPA and can be monitored at [facility].   Subjective:   Patient reports his dyspnea to be better. Denies any chest pain. Vitals stable and satting >90% on room air.   Assessment  & Plan :    Principal Problem:   Acute Pulmonary embolism (HCC) No clear underlying etiology. Placed on IV heparin. hypercoaguable panel sent. CT chest suggests right heart strain. Hemodynamically stable. Check 2-D echo and lower extremity Dopplers. His weight may not permit him to be on NOAC or Lovenox, likely will need Coumadin.   Active Problems: Morbid obesity Patient is working on his diet and regular exercise to lose weight and was encouraged.  Depression Continue Lexapro and Wellbutrin. Stable.  Chronic back  pain Continue home dose Tylenol # 3.      Code Status : Full code  Family Communication  : None at bedside  Disposition Plan  : Home pending clinical improvement (if started on Coumadin he will need to be therapeutic before discharge)  Barriers For Discharge : Active symptoms  Consults  :  None  Procedures  : CT angiogram chest  DVT Prophylaxis  :  IV  heparin  Lab Results  Component Value Date   PLT 204 01/29/2017    Antibiotics  :   Anti-infectives    None        Objective:   Vitals:   01/29/17 0700 01/29/17 0800 01/29/17 0900 01/29/17 1000  BP: 120/72 135/78 125/70 139/71  Pulse: 75 97 87 (!) 118  Resp: 20 (!) 23 (!) 22 (!) 28  Temp:      TempSrc:      SpO2: (!) 87% 96% 95% 96%  Weight:      Height:        Wt Readings from Last 3 Encounters:  01/29/17 (!) 179.6 kg (395 lb 15.1 oz)  12/12/16 (!) 176 kg (388 lb)  11/20/16 (!) 176.4 kg (389 lb)     Intake/Output Summary (Last 24 hours) at 01/29/17 1058 Last data filed at 01/29/17 0834  Gross per 24 hour  Intake           434.28 ml  Output                0 ml  Net           434.28 ml     Physical Exam  Gen: Middle aged morbidly obese male not in distress HEENT: no pallor, moist mucosa, supple neck Chest: clear b/l, no added sounds CVS: N S1&S2, no murmurs, rubs or gallop GI: soft, NT, ND, BS+ Musculoskeletal: warm, no edema     Data Review:    CBC  Recent Labs Lab 01/28/17 1910 01/29/17 0633  WBC 7.1 8.1  HGB 14.0 13.4  HCT 41.2 39.2  PLT 241 204  MCV 92.4 92.9  MCH 31.4 31.8  MCHC 34.0 34.2  RDW 13.1 13.2    Chemistries   Recent Labs Lab 01/28/17 1910 01/29/17 0633  NA 137 137  K 3.8 3.5  CL 101 101  CO2 27 26  GLUCOSE 132* 116*  BUN 14 14  CREATININE 1.07 1.17  CALCIUM 9.2 8.9  AST  --  23  ALT  --  42  ALKPHOS  --  54  BILITOT  --  0.7   ------------------------------------------------------------------------------------------------------------------ No  results for input(s): CHOL, HDL, LDLCALC, TRIG, CHOLHDL, LDLDIRECT in the last 72 hours.  Lab Results  Component Value Date   HGBA1C 5.8 09/29/2014   ------------------------------------------------------------------------------------------------------------------ No results for input(s): TSH, T4TOTAL, T3FREE, THYROIDAB in the last 72 hours.  Invalid input(s): FREET3 ------------------------------------------------------------------------------------------------------------------ No results for input(s): VITAMINB12, FOLATE, FERRITIN, TIBC, IRON, RETICCTPCT in the last 72 hours.  Coagulation profile  Recent Labs Lab 01/28/17 1944  INR 0.96     Recent Labs  01/28/17 1910  DDIMER 2.56*    Cardiac Enzymes  Recent Labs Lab 01/28/17 1910 01/28/17 2241 01/29/17 0909  TROPONINI <0.03 <0.03 <0.03   ------------------------------------------------------------------------------------------------------------------ No results found for: BNP  Inpatient Medications  Scheduled Meds: . buPROPion  150 mg Oral Daily  . escitalopram  10 mg Oral Daily  . fluticasone  2 spray Each Nare Daily   Continuous Infusions: . sodium chloride 50 mL/hr at 01/29/17 0800  . heparin 2,150 Units/hr (01/29/17 0834)   PRN Meds:.acetaminophen **OR** acetaminophen, acetaminophen-codeine, zolpidem  Micro Results Recent Results (from the past 240 hour(s))  MRSA PCR Screening     Status: None   Collection Time: 01/29/17  3:14 AM  Result Value Ref Range Status   MRSA by PCR NEGATIVE NEGATIVE Final    Comment:        The GeneXpert MRSA Assay (  FDA approved for NASAL specimens only), is one component of a comprehensive MRSA colonization surveillance program. It is not intended to diagnose MRSA infection nor to guide or monitor treatment for MRSA infections.     Radiology Reports Dg Chest 2 View  Result Date: 01/28/2017 CLINICAL DATA:  Chest pain EXAM: CHEST  2 VIEW COMPARISON:  01/21/2012  FINDINGS: Streaky atelectasis at the lingula and left base. No consolidation or effusion. Normal cardiomediastinal silhouette. No pneumothorax. Mild scoliosis of the spine. Surgical clips in the upper abdomen IMPRESSION: Mild streaky atelectasis at the lingula and left base Electronically Signed   By: Jasmine Pang M.D.   On: 01/28/2017 17:06   Ct Angio Chest Pe W And/or Wo Contrast  Result Date: 01/28/2017 CLINICAL DATA:  Anterior chest pain for 2 weeks EXAM: CT ANGIOGRAPHY CHEST WITH CONTRAST TECHNIQUE: Multidetector CT imaging of the chest was performed using the standard protocol during bolus administration of intravenous contrast. Multiplanar CT image reconstructions and MIPs were obtained to evaluate the vascular anatomy. CONTRAST:  100 mL Isovue 370 intravenous COMPARISON:  Radiograph 01/28/2017 FINDINGS: Cardiovascular: Satisfactory opacification of the pulmonary arteries to the segmental level. Filling defect within the distal right pulmonary artery with thrombus extending into right upper and lower lobe pulmonary artery. Multiple filling defects visualized within segmental and subsegmental branches of the right upper, right middle, and right lower lobes. Additional filling defects are visualized within distal segmental/subsegmental left upper lobe and left lower lobe branches consistent with emboli. RV LV ratio meet criteria for right heart strain at 0.9. No pericardial effusion.  Nonaneurysmal aorta. Mediastinum/Nodes: Midline trachea. No thyroid mass. Nonspecific subcentimeter mediastinal lymph nodes. Lungs/Pleura: Lungs are clear. No pleural effusion or pneumothorax. Upper Abdomen: Hepatic steatosis. Surgical clips at the gallbladder fossa Musculoskeletal: No chest Gudino abnormality. No acute or significant osseous findings. Review of the MIP images confirms the above findings. IMPRESSION: 1. Acute pulmonary emboli involving the distal right main pulmonary artery with extension into multiple right  upper, middle, and lower lobe pulmonary vessels in addition to multiple distal segmental and subsegmental emboli within the left upper and lower lobes. Positive for acute PE with CT evidence of right heart strain (RV/LV Ratio = 0.9) consistent with at least submassive (intermediate risk) PE. The presence of right heart strain has been associated with an increased risk of morbidity and mortality. Please activate Code PE by paging [phone]. Critical Value/emergent results were called by telephone at the time of interpretation on 01/28/2017 at 10:44 pm to Dr. Doug Sou , who verbally acknowledged these results. Electronically Signed   By: Jasmine Pang M.D.   On: 01/28/2017 22:44    Time Spent in minutes 35   Eddie North M.D on 01/29/2017 at 10:58 AM  Between 7am to 7pm - Pager - [phone]  After 7pm go to [email or web address] - password [facility]  [facility] -  Office  [phone]

## 2017-01-29 NOTE — Telephone Encounter (Signed)
Patient seen in office and sent to ER

## 2017-01-29 NOTE — ED Notes (Signed)
Report given to Robbie RN in ICU 

## 2017-01-29 NOTE — Progress Notes (Signed)
ANTICOAGULATION CONSULT NOTE - follow up  Pharmacy Consult for Heparin Indication: Pulmonary embolism  No Known Allergies  Patient Measurements: Height:  (175.3 cm) Weight: (!) 395 lb 15.1 oz (179.6 kg) IBW/kg (Calculated) : 70.7 HEPARIN DW (KG): 114.7   Vital Signs: BP: 129/78 (10/04 1539) Pulse Rate: 82 (10/04 1539)  Labs:  Recent Labs  01/28/17 1910 01/28/17 1944  01/29/17 0621 01/29/17 0633 01/29/17 0909 01/29/17 1428  HGB 14.0  --   --   --  13.4  --   --   HCT 41.2  --   --   --  39.2  --   --   PLT 241  --   --   --  204  --   --   APTT  --  33  --   --   --   --   --   LABPROT  --  12.7  --   --   --   --   --   INR  --  0.96  --   --   --   --   --   HEPARINUNFRC  --   --   --   --  0.20*  --  0.27*  CREATININE 1.07  --   --   --  1.17  --   --   TROPONINI <0.03  --   < > <0.03  --  <0.03 <0.03  < > = values in this interval not displayed. Estimated Creatinine Clearance: 141.1 mL/min (by C-G formula based on SCr of 1.17 mg/dL).  Medical History: Past Medical History:  Diagnosis Date  . Back pain   . Gallstones   . Hyperlipidemia   . Pre-diabetes     Medications:   Assessment: 36 yo male with history of anterior chest pain x 2 weeks. Acute bilateral pulmonary emboli seen on CT. Pharmacy has been asked to dose IV heparin. HL remains slightly subtherapeutic.  Goal of Therapy:  Heparin goal level: 0.3-0.7 units/ml Monitor platelets by anticoagulation protocol: Yes   Plan:  Heparin 2000 unit IV bolus Increase Heparin infusion to 2350 units/hr Check anti-Xa level in 6-8 hours and daily while on heparin Monitor for S/S of bleeding F/U plan for oral anticoagulation  Mady Gemma, RPH  01/29/2017,4:18 PM

## 2017-01-29 NOTE — Progress Notes (Signed)
ANTICOAGULATION CONSULT NOTE - follow up  Pharmacy Consult for Heparin Indication: Pulmonary embolism  No Known Allergies  Patient Measurements: Height:  (175.3 cm) Weight: (!) 395 lb 15.1 oz (179.6 kg) IBW/kg (Calculated) : 70.7 HEPARIN DW (KG): 114.7   Vital Signs: BP: 127/65 (10/04 0500) Pulse Rate: 71 (10/04 0500)  Labs:  Recent Labs  01/28/17 1910 01/28/17 1944 01/28/17 2241 01/29/17 0633  HGB 14.0  --   --  13.4  HCT 41.2  --   --  39.2  PLT 241  --   --  204  APTT  --  33  --   --   LABPROT  --  12.7  --   --   INR  --  0.96  --   --   HEPARINUNFRC  --   --   --  0.20*  CREATININE 1.07  --   --  1.17  TROPONINI <0.03  --  <0.03  --    Estimated Creatinine Clearance: 141.1 mL/min (by C-G formula based on SCr of 1.17 mg/dL).  Medical History: Past Medical History:  Diagnosis Date  . Back pain   . Gallstones   . Hyperlipidemia   . Pre-diabetes     Medications:   Assessment: 36 yo male with history of anterior chest pain x 2 weeks. Acute bilateral pulmonary emboli seen on CT. Pharmacy has been asked to dose IV heparin. HL is subtherapeutic.  Goal of Therapy:  Heparin goal level: 0.3-0.7 units/ml Monitor platelets by anticoagulation protocol: Yes   Plan:  Heparin 2000 unit IV bolus Increase Heparin infusion to 2150 units/hr Check anti-Xa level in 6-8 hours and daily while on heparin Monitor for S/S of bleeding F/U plan for oral anticoagulation  Elder Cyphers, BS Loura Back, BCPS Clinical Pharmacist Pager [phone] 01/29/2017,7:59 AM

## 2017-01-30 DIAGNOSIS — Z6841 Body Mass Index (BMI) 40.0 and over, adult: Secondary | ICD-10-CM

## 2017-01-30 DIAGNOSIS — I2692 Saddle embolus of pulmonary artery without acute cor pulmonale: Secondary | ICD-10-CM | POA: Diagnosis present

## 2017-01-30 DIAGNOSIS — R0609 Other forms of dyspnea: Secondary | ICD-10-CM

## 2017-01-30 HISTORY — DX: Saddle embolus of pulmonary artery without acute cor pulmonale: I26.92

## 2017-01-30 LAB — CBC
HEMATOCRIT: 39.1 % (ref 39.0–52.0)
Hemoglobin: 13.3 g/dL (ref 13.0–17.0)
MCH: 31.3 pg (ref 26.0–34.0)
MCHC: 34 g/dL (ref 30.0–36.0)
MCV: 92 fL (ref 78.0–100.0)
PLATELETS: 229 10*3/uL (ref 150–400)
RBC: 4.25 MIL/uL (ref 4.22–5.81)
RDW: 13.1 % (ref 11.5–15.5)
WBC: 7.3 10*3/uL (ref 4.0–10.5)

## 2017-01-30 LAB — PROTEIN C ACTIVITY: Protein C Activity: 129 % (ref 73–180)

## 2017-01-30 LAB — LUPUS ANTICOAGULANT PANEL
DRVVT: 51.1 s — ABNORMAL HIGH (ref 0.0–47.0)
PTT Lupus Anticoagulant: 48.1 s (ref 0.0–51.9)

## 2017-01-30 LAB — HIV ANTIBODY (ROUTINE TESTING W REFLEX): HIV Screen 4th Generation wRfx: NONREACTIVE

## 2017-01-30 LAB — PROTEIN S ACTIVITY: PROTEIN S ACTIVITY: 93 % (ref 63–140)

## 2017-01-30 LAB — DRVVT MIX: DRVVT MIX: 40.3 s (ref 0.0–47.0)

## 2017-01-30 LAB — HOMOCYSTEINE: Homocysteine: 9.2 umol/L (ref 0.0–15.0)

## 2017-01-30 LAB — HEPARIN LEVEL (UNFRACTIONATED): Heparin Unfractionated: 0.33 IU/mL (ref 0.30–0.70)

## 2017-01-30 LAB — PROTEIN S, TOTAL: Protein S Ag, Total: 97 % (ref 60–150)

## 2017-01-30 MED ORDER — WARFARIN VIDEO: Freq: Once | Status: AC

## 2017-01-30 MED ORDER — COUMADIN BOOK
Freq: Once | Status: AC
  Administered 2017-01-30: 11:00:00
  Filled 2017-01-30: qty 1

## 2017-01-30 MED ORDER — WARFARIN - PHARMACIST DOSING INPATIENT
Status: DC
  Administered 2017-01-30 – 2017-01-31 (×2)

## 2017-01-30 MED ORDER — WARFARIN SODIUM 5 MG PO TABS
7.5000 mg | ORAL_TABLET | Freq: Once | ORAL | Status: AC
  Administered 2017-01-30: 7.5 mg via ORAL
  Filled 2017-01-30: qty 1

## 2017-01-30 MED ORDER — ACETAMINOPHEN-CODEINE #3 300-30 MG PO TABS
1.0000 | ORAL_TABLET | Freq: Three times a day (TID) | ORAL | Status: DC | PRN
  Administered 2017-01-30: 1 via ORAL
  Filled 2017-01-30: qty 1

## 2017-01-30 NOTE — Progress Notes (Signed)
Coumadin booklet given to patient. Educated on different topics within the book. Mother was at bedside and was listening to education as well. Will continue to educate pt throughout day.

## 2017-01-30 NOTE — Progress Notes (Signed)
PROGRESS NOTE                                                                                                                                                                                                             Patient Demographics:    Justin Schmidt, is a 36 y.o. male, DOB - 01/07/1981, UJW:[id number]  Admit date - 01/28/2017   Admitting Physician Pearson Grippe, MD  Outpatient Primary MD for the patient is Johna Sheriff, MD  LOS - 2  Outpatient Specialists:None  Chief Complaint  Patient presents with  . Chest Pain       Brief Narrative   36 year old morbidly obese male with chronic back pain, hyperlipidemia with one week history of progressive dyspnea presented to the ED and was found to have submassive PE. Reported some chest tightness earlier in the course. Denies any fever, chills, nausea, vomiting, recent travel, prior history of blood clots or family history of blood clot or sudden cardiac death. Reports trying to be active in the forearm and going to gym frequently. Patient placed on IV heparin drip and admitted to stepdown unit. PC CM was consulted by ED physician who recommended patient does not need TPA and can be monitored at [facility].   Subjective:   Feels better overall. Maintaining sats on room air..   Assessment  & Plan :    Principal Problem:   Acute Pulmonary embolism (HCC) No clear underlying etiology. Placed on IV heparin. hypercoaguable panel sent. CT chest suggests right heart strain but  was not evident on 2-D echo. Lower extremity Dopplers negative for DVT. With his BMI of 57, there have not been good studies to prove safety and efficacy of using NOAC or anticoagulation. Lovenox is also not beneficial for the same reason. Will start him on Coumadin. Patient will remain in the hospital until INR therapeutic.  Hemodynamically stable except for blood pressure with SBP 90s. Stable for transfer  to telemetry.  Active Problems: Morbid obesity Patient is working on his diet and regular exercise to lose weight and was encouraged.  Depression Continue Lexapro and Wellbutrin. Stable.  Chronic back pain Continue home dose Tylenol # 3.      Code Status : Full code  Family Communication  : None at bedside  Disposition Plan  : Home once INR therapeutic on Coumadin.  Barriers For Discharge : Active symptoms  Consults  :  None  Procedures  :  CT angiogram chest Doppler lower extremity 2-D echo  DVT Prophylaxis  :   IV heparin Coumadin (started on 10/5)  Lab Results  Component Value Date   PLT 229 01/30/2017    Antibiotics  :   Anti-infectives    None        Objective:   Vitals:   01/30/17 0449 01/30/17 0500 01/30/17 0600 01/30/17 0800  BP:  124/68 124/75 (!) 95/53  Pulse:  62 64   Resp:  20 18   Temp:      TempSrc:      SpO2:  91% 96%   Weight: (!) 174 kg (383 lb 9.6 oz)     Height:        Wt Readings from Last 3 Encounters:  01/30/17 (!) 174 kg (383 lb 9.6 oz)  12/12/16 (!) 176 kg (388 lb)  11/20/16 (!) 176.4 kg (389 lb)     Intake/Output Summary (Last 24 hours) at 01/30/17 0936 Last data filed at 01/30/17 0800  Gross per 24 hour  Intake           935.12 ml  Output                0 ml  Net           935.12 ml     Physical Exam Gen.: Morbidly obese male not in distress   HEENT: Moist mucosa, supple neck Chest: Clear bilaterally CVS: Normal S1 and S2, no murmurs GI: Soft, nontender, nondistended Musculoskeletal: Warm, no edema      Data Review:    CBC  Recent Labs Lab 01/28/17 1910 01/29/17 0633 01/30/17 0405  WBC 7.1 8.1 7.3  HGB 14.0 13.4 13.3  HCT 41.2 39.2 39.1  PLT 241 204 229  MCV 92.4 92.9 92.0  MCH 31.4 31.8 31.3  MCHC 34.0 34.2 34.0  RDW 13.1 13.2 13.1    Chemistries   Recent Labs Lab 01/28/17 1910 01/29/17 0633  NA 137 137  K 3.8 3.5  CL 101 101  CO2 27 26  GLUCOSE 132* 116*  BUN 14 14    CREATININE 1.07 1.17  CALCIUM 9.2 8.9  AST  --  23  ALT  --  42  ALKPHOS  --  54  BILITOT  --  0.7   ------------------------------------------------------------------------------------------------------------------ No results for input(s): CHOL, HDL, LDLCALC, TRIG, CHOLHDL, LDLDIRECT in the last 72 hours.  Lab Results  Component Value Date   HGBA1C 5.8 09/29/2014   ------------------------------------------------------------------------------------------------------------------ No results for input(s): TSH, T4TOTAL, T3FREE, THYROIDAB in the last 72 hours.  Invalid input(s): FREET3 ------------------------------------------------------------------------------------------------------------------ No results for input(s): VITAMINB12, FOLATE, FERRITIN, TIBC, IRON, RETICCTPCT in the last 72 hours.  Coagulation profile  Recent Labs Lab 01/28/17 1944  INR 0.96     Recent Labs  01/28/17 1910  DDIMER 2.56*    Cardiac Enzymes  Recent Labs Lab 01/29/17 0621 01/29/17 0909 01/29/17 1428  TROPONINI <0.03 <0.03 <0.03   ------------------------------------------------------------------------------------------------------------------ No results found for: BNP  Inpatient Medications  Scheduled Meds: . buPROPion  150 mg Oral Daily  . coumadin book   Does not apply Once  . escitalopram  10 mg Oral Daily  . fluticasone  2 spray Each Nare Daily  . warfarin  7.5 mg Oral Once  . warfarin   Does not apply Once  . Warfarin - Pharmacist Dosing Inpatient   Does not apply Q24H  Continuous Infusions: . heparin 2,350 Units/hr (01/30/17 0800)   PRN Meds:.acetaminophen **OR** acetaminophen, acetaminophen-codeine, zolpidem  Micro Results Recent Results (from the past 240 hour(s))  MRSA PCR Screening     Status: None   Collection Time: 01/29/17  3:14 AM  Result Value Ref Range Status   MRSA by PCR NEGATIVE NEGATIVE Final    Comment:        The GeneXpert MRSA Assay  (FDA approved for NASAL specimens only), is one component of a comprehensive MRSA colonization surveillance program. It is not intended to diagnose MRSA infection nor to guide or monitor treatment for MRSA infections.     Radiology Reports Dg Chest 2 View  Result Date: 01/28/2017 CLINICAL DATA:  Chest pain EXAM: CHEST  2 VIEW COMPARISON:  01/21/2012 FINDINGS: Streaky atelectasis at the lingula and left base. No consolidation or effusion. Normal cardiomediastinal silhouette. No pneumothorax. Mild scoliosis of the spine. Surgical clips in the upper abdomen IMPRESSION: Mild streaky atelectasis at the lingula and left base Electronically Signed   By: Jasmine Pang M.D.   On: 01/28/2017 17:06   Ct Angio Chest Pe W And/or Wo Contrast  Result Date: 01/28/2017 CLINICAL DATA:  Anterior chest pain for 2 weeks EXAM: CT ANGIOGRAPHY CHEST WITH CONTRAST TECHNIQUE: Multidetector CT imaging of the chest was performed using the standard protocol during bolus administration of intravenous contrast. Multiplanar CT image reconstructions and MIPs were obtained to evaluate the vascular anatomy. CONTRAST:  100 mL Isovue 370 intravenous COMPARISON:  Radiograph 01/28/2017 FINDINGS: Cardiovascular: Satisfactory opacification of the pulmonary arteries to the segmental level. Filling defect within the distal right pulmonary artery with thrombus extending into right upper and lower lobe pulmonary artery. Multiple filling defects visualized within segmental and subsegmental branches of the right upper, right middle, and right lower lobes. Additional filling defects are visualized within distal segmental/subsegmental left upper lobe and left lower lobe branches consistent with emboli. RV LV ratio meet criteria for right heart strain at 0.9. No pericardial effusion.  Nonaneurysmal aorta. Mediastinum/Nodes: Midline trachea. No thyroid mass. Nonspecific subcentimeter mediastinal lymph nodes. Lungs/Pleura: Lungs are clear. No  pleural effusion or pneumothorax. Upper Abdomen: Hepatic steatosis. Surgical clips at the gallbladder fossa Musculoskeletal: No chest Rager abnormality. No acute or significant osseous findings. Review of the MIP images confirms the above findings. IMPRESSION: 1. Acute pulmonary emboli involving the distal right main pulmonary artery with extension into multiple right upper, middle, and lower lobe pulmonary vessels in addition to multiple distal segmental and subsegmental emboli within the left upper and lower lobes. Positive for acute PE with CT evidence of right heart strain (RV/LV Ratio = 0.9) consistent with at least submassive (intermediate risk) PE. The presence of right heart strain has been associated with an increased risk of morbidity and mortality. Please activate Code PE by paging [phone]. Critical Value/emergent results were called by telephone at the time of interpretation on 01/28/2017 at 10:44 pm to Dr. Doug Sou , who verbally acknowledged these results. Electronically Signed   By: Jasmine Pang M.D.   On: 01/28/2017 22:44   US Venous Img Lower Bilateral  Result Date: 01/29/2017 CLINICAL DATA:  Acute pulmonary embolism, morbid obesity. EXAM: BILATERAL LOWER EXTREMITY VENOUS DOPPLER ULTRASOUND TECHNIQUE: Gray-scale sonography with graded compression, as well as color Doppler and duplex ultrasound were performed to evaluate the lower extremity deep venous systems from the level of the common femoral vein and including the common femoral, femoral, profunda femoral, popliteal and calf veins including the posterior tibial, peroneal and  gastrocnemius veins when visible. The superficial great saphenous vein was also interrogated. Spectral Doppler was utilized to evaluate flow at rest and with distal augmentation maneuvers in the common femoral, femoral and popliteal veins. COMPARISON:  None in PACs FINDINGS: RIGHT LOWER EXTREMITY Common Femoral Vein: No evidence of thrombus. Normal  compressibility, respiratory phasicity and response to augmentation. Saphenofemoral Junction: No evidence of thrombus. Normal compressibility and flow on color Doppler imaging. Profunda Femoral Vein: No evidence of thrombus. Normal compressibility and flow on color Doppler imaging. Femoral Vein: No evidence of thrombus. Normal compressibility, respiratory phasicity and response to augmentation. Popliteal Vein: No evidence of thrombus. Normal compressibility, respiratory phasicity and response to augmentation. Calf Veins: No evidence of thrombus. Normal compressibility and flow on color Doppler imaging. Superficial Great Saphenous Vein: No evidence of thrombus. Normal compressibility and flow on color Doppler imaging. Venous Reflux:  None. Other Findings:  None. LEFT LOWER EXTREMITY Common Femoral Vein: No evidence of thrombus. Normal compressibility, respiratory phasicity and response to augmentation. Saphenofemoral Junction: No evidence of thrombus. Normal compressibility and flow on color Doppler imaging. Profunda Femoral Vein: No evidence of thrombus. Normal compressibility and flow on color Doppler imaging. Femoral Vein: No evidence of thrombus. Normal compressibility, respiratory phasicity and response to augmentation. Popliteal Vein: No evidence of thrombus. Normal compressibility, respiratory phasicity and response to augmentation. Calf Veins: No evidence of thrombus. Normal compressibility and flow on color Doppler imaging. Superficial Great Saphenous Vein: No evidence of thrombus. Normal compressibility and flow on color Doppler imaging. Venous Reflux:  None. Other Findings:  None. IMPRESSION: No evidence of DVT within either lower extremity. Electronically Signed   By: David  [place] M.D.   On: 01/29/2017 14:05    Time Spent in minutes 35   Eddie North M.D on 01/30/2017 at 9:36 AM  Between 7am to 7pm - Pager - [phone]  After 7pm go to [email or web address] - password [facility]  [facility] -   Office  [phone]

## 2017-01-30 NOTE — Progress Notes (Addendum)
ANTICOAGULATION CONSULT NOTE - follow up  Pharmacy Consult for Heparin >> Warfarin Indication: Pulmonary embolism  No Known Allergies  Patient Measurements: Height:  (175.3 cm) Weight: (!) 383 lb 9.6 oz (174 kg) IBW/kg (Calculated) : 70.7 HEPARIN DW (KG): 114.7  Vital Signs: BP: 124/75 (10/05 0600) Pulse Rate: 64 (10/05 0600)  Labs:  Recent Labs  01/28/17 1910 01/28/17 1944  01/29/17 0621  01/29/17 0633 01/29/17 0909 01/29/17 1428 01/29/17 2223 01/30/17 0405  HGB 14.0  --   --   --   --  13.4  --   --   --  13.3  HCT 41.2  --   --   --   --  39.2  --   --   --  39.1  PLT 241  --   --   --   --  204  --   --   --  229  APTT  --  33  --   --   --   --   --   --   --   --   LABPROT  --  12.7  --   --   --   --   --   --   --   --   INR  --  0.96  --   --   --   --   --   --   --   --   HEPARINUNFRC  --   --   --   --   < > 0.20*  --  0.27* 0.56 0.33  CREATININE 1.07  --   --   --   --  1.17  --   --   --   --   TROPONINI <0.03  --   < > <0.03  --   --  <0.03 <0.03  --   --   < > = values in this interval not displayed. Estimated Creatinine Clearance: 138.3 mL/min (by C-G formula based on SCr of 1.17 mg/dL).  Medical History: Past Medical History:  Diagnosis Date  . Back pain   . Gallstones   . Hyperlipidemia   . Pre-diabetes    Prescriptions Prior to Admission  Medication Sig Dispense Refill Last Dose  . acetaminophen-codeine (TYLENOL #3) 300-30 MG tablet Take 1 tablet by mouth at bedtime as needed for moderate pain. 30 tablet 2 01/27/2017 at Unknown time  . Ascorbic Acid (VITAMIN C) 1000 MG tablet Take 1,000 mg by mouth daily.   Past Week at Unknown time  . Cholecalciferol (VITAMIN D) 2000 UNITS tablet Take 2,000 Units by mouth daily.   Past Week at Unknown time  . fluticasone (FLONASE) 50 MCG/ACT nasal spray Place 2 sprays into both nostrils daily. 16 g 2 Past Month at Unknown time  . ibuprofen (ADVIL,MOTRIN) 200 MG tablet Take 200 mg by mouth every 6 (six)  hours as needed for mild pain or moderate pain.   01/27/2017 at Unknown time  . Misc Natural Products (GREEN TEA) TABS Take 315 mg by mouth daily.   Past Month at Unknown time  . Multiple Vitamin (MULTIVITAMIN WITH MINERALS) TABS Take 1 tablet by mouth daily.   Past Week at Unknown time  . buPROPion (WELLBUTRIN XL) 150 MG 24 hr tablet Take 1 tablet (150 mg total) by mouth daily. (Patient not taking: Reported on 01/28/2017) 30 tablet 5 Not Taking at Unknown time  . escitalopram (LEXAPRO) 10 MG tablet Take 1 tablet (10 mg total) by  mouth daily. (Patient not taking: Reported on 01/28/2017) 30 tablet 5 Not Taking at Unknown time  . phentermine 37.5 MG capsule Take 1 capsule (37.5 mg total) by mouth every morning. (Patient not taking: Reported on 01/28/2017) 30 capsule 2 Not Taking at Unknown time   Assessment: 36 yo male with history of anterior chest pain x 2 weeks. Acute bilateral pulmonary emboli seen on CT. Pharmacy has been asked to dose IV heparin. HL is now therapeutic but on low end of range.  Pt is obese.  Asked to transition to Warfarin.  Goal of Therapy:  Heparin goal level: 0.3-0.7 units/ml Monitor platelets by anticoagulation protocol: Yes   Plan:  Increase Heparin infusion to 2500 units/hr Coumadin 7.5mg  today x 1 Check anti-Xa daily while on heparin w/ INR Monitor for S/S of bleeding F/U plan for oral anticoagulation  Valrie Hart A, RPH  01/30/2017,7:49 AM

## 2017-01-31 LAB — CBC
HCT: 40.7 % (ref 39.0–52.0)
Hemoglobin: 13.6 g/dL (ref 13.0–17.0)
MCH: 31.3 pg (ref 26.0–34.0)
MCHC: 33.4 g/dL (ref 30.0–36.0)
MCV: 93.8 fL (ref 78.0–100.0)
PLATELETS: 257 10*3/uL (ref 150–400)
RBC: 4.34 MIL/uL (ref 4.22–5.81)
RDW: 13.2 % (ref 11.5–15.5)
WBC: 7.6 10*3/uL (ref 4.0–10.5)

## 2017-01-31 LAB — PROTEIN C, TOTAL: PROTEIN C, TOTAL: 143 % (ref 60–150)

## 2017-01-31 LAB — PROTIME-INR
INR: 1
PROTHROMBIN TIME: 13.1 s (ref 11.4–15.2)

## 2017-01-31 LAB — HEPARIN LEVEL (UNFRACTIONATED): HEPARIN UNFRACTIONATED: 0.45 [IU]/mL (ref 0.30–0.70)

## 2017-01-31 MED ORDER — WARFARIN SODIUM 5 MG PO TABS
7.5000 mg | ORAL_TABLET | Freq: Once | ORAL | Status: AC
  Administered 2017-01-31: 16:00:00 7.5 mg via ORAL
  Filled 2017-01-31: qty 1

## 2017-01-31 NOTE — Progress Notes (Signed)
PROGRESS NOTE    Justin Schmidt  WUJ:[id number] DOB: 11/28/1980 DOA: 01/28/2017 PCP: Johna Sheriff, MD     Brief Narrative:  36 year old man admitted from home on 10/3 due to shortness of breath found to have bilateral submassive PEs on CT angiogram. Upon presentation EDP discuss case with PC CM who believed the patient did not require TPA and could be admitted to this hospital.   Assessment & Plan:   Principal Problem:   Acute saddle pulmonary embolism without acute cor pulmonale (HCC) Active Problems:   Depression   Dyspnea on exertion   Morbid obesity with BMI of 50.0-59.9, adult (HCC)   Acute PE without cor pulmonale  -For now has been placed on Coumadin with heparin bridge. -Have discussed possibility of NOAC with patient and family. They will decide and let me know tomorrow. -Discussed with case management who has given patient a 30 day card for Xarelto. -CT suggested right heart strain, this was not evident on 2-D echo. -Patient is hemodynamically stable.   morbid obesity -Noted, patient encouraged to continue diet and exercise.  Depression -Mood is stable. Continue Lexapro and Wellbutrin.   DVT prophylaxis: IV Heparin Code Status: full code Family Communication: mother and father via phone Disposition Plan: home when ready, anticipate 24 hours  Consultants:   None  Procedures:   None  Antimicrobials:  Anti-infectives    None       Subjective: Feels well, no issues  Objective: Vitals:   01/30/17 1331 01/30/17 2106 01/31/17 0539 01/31/17 1300  BP: (!) 110/59 122/64 116/61 109/75  Pulse: 83 76 79 78  Resp: Temp: 98.2 F (36.8 C) 97.7 F (36.5 C) 97.9 F (36.6 C) 97.8 F (36.6 C)  TempSrc: Oral Oral Oral Oral  SpO2: 95% 96% 95% 100%  Weight:  (!) 174.9 kg (385 lb 8 oz) (!) 173.5 kg (382 lb 8 oz)   Height:        Intake/Output Summary (Last 24 hours) at 01/31/17 1708 Last data filed at 01/31/17 0900  Gross per 24 hour    Intake          1705.85 ml  Output              900 ml  Net           805.85 ml   Filed Weights   01/30/17 0449 01/30/17 2106 01/31/17 0539  Weight: (!) 174 kg (383 lb 9.6 oz) (!) 174.9 kg (385 lb 8 oz) (!) 173.5 kg (382 lb 8 oz)    Examination:  General exam: Alert, awake, oriented x 3 Respiratory system: Clear to auscultation. Respiratory effort normal. Cardiovascular system:RRR. No murmurs, rubs, gallops. Gastrointestinal system: Abdomen is nondistended, soft and nontender. No organomegaly or masses felt. Normal bowel sounds heard. Central nervous system: Alert and oriented. No focal neurological deficits. Extremities: No C/C/E, +pedal pulses Skin: No rashes, lesions or ulcers Psychiatry: Judgement and insight appear normal. Mood & affect appropriate.     Data Reviewed: I have personally reviewed following labs and imaging studies  CBC:  Recent Labs Lab 01/28/17 1910 01/29/17 0633 01/30/17 0405 01/31/17 0420  WBC 7.1 8.1 7.3 7.6  HGB 14.0 13.4 13.3 13.6  HCT 41.2 39.2 39.1 40.7  MCV 92.4 92.9 92.0 93.8  PLT 241 204 229 257   Basic Metabolic Panel:  Recent Labs Lab 01/28/17 1910 01/29/17 0633  NA 137 137  K 3.8 3.5  CL 101 101  CO2 27  26  GLUCOSE 132* 116*  BUN 14 14  CREATININE 1.07 1.17  CALCIUM 9.2 8.9   GFR: Estimated Creatinine Clearance: 138 mL/min (by C-G formula based on SCr of 1.17 mg/dL). Liver Function Tests:  Recent Labs Lab 01/29/17 0633  AST 23  ALT 42  ALKPHOS 54  BILITOT 0.7  PROT 6.9  ALBUMIN 3.6   No results for input(s): LIPASE, AMYLASE in the last 168 hours. No results for input(s): AMMONIA in the last 168 hours. Coagulation Profile:  Recent Labs Lab 01/28/17 1944 01/31/17 0420  INR 0.96 1.00   Cardiac Enzymes:  Recent Labs Lab 01/28/17 1910 01/28/17 2241 01/29/17 0621 01/29/17 0909 01/29/17 1428  TROPONINI <0.03 <0.03 <0.03 <0.03 <0.03   BNP (last 3 results) No results for input(s): PROBNP in the last  8760 hours. HbA1C: No results for input(s): HGBA1C in the last 72 hours. CBG: No results for input(s): GLUCAP in the last 168 hours. Lipid Profile: No results for input(s): CHOL, HDL, LDLCALC, TRIG, CHOLHDL, LDLDIRECT in the last 72 hours. Thyroid Function Tests: No results for input(s): TSH, T4TOTAL, FREET4, T3FREE, THYROIDAB in the last 72 hours. Anemia Panel: No results for input(s): VITAMINB12, FOLATE, FERRITIN, TIBC, IRON, RETICCTPCT in the last 72 hours. Urine analysis:    Component Value Date/Time   COLORURINE YELLOW 01/21/2012 2342   APPEARANCEUR CLEAR 01/21/2012 2342   LABSPEC 1.025 01/21/2012 2342   PHURINE 5.5 01/21/2012 2342   GLUCOSEU NEGATIVE 01/21/2012 2342   HGBUR NEGATIVE 01/21/2012 2342   BILIRUBINUR NEGATIVE 01/21/2012 2342   KETONESUR NEGATIVE 01/21/2012 2342   PROTEINUR NEGATIVE 01/21/2012 2342   UROBILINOGEN 0.2 01/21/2012 2342   NITRITE NEGATIVE 01/21/2012 2342   LEUKOCYTESUR NEGATIVE 01/21/2012 2342   Sepsis Labs: (procalcitonin:4,lacticidven:4)  ) Recent Results (from the past 240 hour(s))  MRSA PCR Screening     Status: None   Collection Time: 01/29/17  3:14 AM  Result Value Ref Range Status   MRSA by PCR NEGATIVE NEGATIVE Final    Comment:        The GeneXpert MRSA Assay (FDA approved for NASAL specimens only), is one component of a comprehensive MRSA colonization surveillance program. It is not intended to diagnose MRSA infection nor to guide or monitor treatment for MRSA infections.          Radiology Studies: No results found.      Scheduled Meds: . buPROPion  150 mg Oral Daily  . escitalopram  10 mg Oral Daily  . fluticasone  2 spray Each Nare Daily  . Warfarin - Pharmacist Dosing Inpatient   Does not apply Q24H   Continuous Infusions: . heparin 2,500 Units/hr (01/31/17 1442)     LOS: 3 days    Time spent: 35 minutes. Greater than 50% of this time was spent in direct contact with the patient coordinating  care.     Chaya Jan, MD [facility] Pager [phone]  If 7PM-7AM, please contact night-coverage [email or web address] Password [email or web address] 01/31/2017, 5:08 PM

## 2017-01-31 NOTE — Progress Notes (Signed)
ANTICOAGULATION CONSULT NOTE - follow up  Pharmacy Consult for Heparin >> Warfarin Indication: Pulmonary embolism  No Known Allergies  Patient Measurements: Height:  (175.3 cm) Weight: (!) 382 lb 8 oz (173.5 kg) IBW/kg (Calculated) : 70.7 HEPARIN DW (KG): 114.7  Vital Signs: Temp: 97.9 F (36.6 C) (10/06 0539) Temp Source: Oral (10/06 0539) BP: 116/61 (10/06 0539) Pulse Rate: 79 (10/06 0539)  Labs:  Recent Labs  01/28/17 1910 01/28/17 1944  01/29/17 1610  01/29/17 9604 01/29/17 0909 01/29/17 1428 01/29/17 2223 01/30/17 0405 01/31/17 0419 01/31/17 0420  HGB 14.0  --   --   --   --  13.4  --   --   --  13.3  --  13.6  HCT 41.2  --   --   --   --  39.2  --   --   --  39.1  --  40.7  PLT 241  --   --   --   --  204  --   --   --  229  --  257  APTT  --  33  --   --   --   --   --   --   --   --   --   --   LABPROT  --  12.7  --   --   --   --   --   --   --   --   --  13.1  INR  --  0.96  --   --   --   --   --   --   --   --   --  1.00  HEPARINUNFRC  --   --   --   --   < > 0.20*  --  0.27* 0.56 0.33 0.45  --   CREATININE 1.07  --   --   --   --  1.17  --   --   --   --   --   --   TROPONINI <0.03  --   < > <0.03  --   --  <0.03 <0.03  --   --   --   --   < > = values in this interval not displayed. Estimated Creatinine Clearance: 138 mL/min (by C-G formula based on SCr of 1.17 mg/dL).  Medical History: Past Medical History:  Diagnosis Date  . Back pain   . Gallstones   . Hyperlipidemia   . Pre-diabetes    Prescriptions Prior to Admission  Medication Sig Dispense Refill Last Dose  . acetaminophen-codeine (TYLENOL #3) 300-30 MG tablet Take 1 tablet by mouth at bedtime as needed for moderate pain. 30 tablet 2 01/27/2017 at Unknown time  . Ascorbic Acid (VITAMIN C) 1000 MG tablet Take 1,000 mg by mouth daily.   Past Week at Unknown time  . Cholecalciferol (VITAMIN D) 2000 UNITS tablet Take 2,000 Units by mouth daily.   Past Week at Unknown time  . fluticasone  (FLONASE) 50 MCG/ACT nasal spray Place 2 sprays into both nostrils daily. 16 g 2 Past Month at Unknown time  . ibuprofen (ADVIL,MOTRIN) 200 MG tablet Take 200 mg by mouth every 6 (six) hours as needed for mild pain or moderate pain.   01/27/2017 at Unknown time  . Misc Natural Products (GREEN TEA) TABS Take 315 mg by mouth daily.   Past Month at Unknown time  . Multiple Vitamin (MULTIVITAMIN WITH MINERALS) TABS Take 1  tablet by mouth daily.   Past Week at Unknown time  . buPROPion (WELLBUTRIN XL) 150 MG 24 hr tablet Take 1 tablet (150 mg total) by mouth daily. (Patient not taking: Reported on 01/28/2017) 30 tablet 5 Not Taking at Unknown time  . escitalopram (LEXAPRO) 10 MG tablet Take 1 tablet (10 mg total) by mouth daily. (Patient not taking: Reported on 01/28/2017) 30 tablet 5 Not Taking at Unknown time  . phentermine 37.5 MG capsule Take 1 capsule (37.5 mg total) by mouth every morning. (Patient not taking: Reported on 01/28/2017) 30 capsule 2 Not Taking at Unknown time   Assessment: 36 yo male with history of anterior chest pain x 2 weeks. Acute bilateral pulmonary emboli seen on CT. Pharmacy has been asked to dose IV heparin. HL is now therapeutic. Pt is obese.  Asked to transition to Warfarin.  Goal of Therapy:  Heparin goal level: 0.3-0.7 units/ml Monitor platelets by anticoagulation protocol: Yes INR 2-3   Plan:  Continue Heparin infusion at 2500 units/hr Coumadin 7.5mg  today x 1 Check anti-Xa daily while on heparin w/ INR Monitor for S/S of bleeding  Valrie Hart A, RPH  01/31/2017,8:47 AM

## 2017-01-31 NOTE — Care Management Note (Signed)
Case Management Note  Patient Details  Name: TERANCE POMPLUN MRN: [id number] Date of Birth: 02-Nov-1980  Subjective/Objective:                 Herbert Pun 30 day free card to [phone] attn to patient. Spoke with patient over the phone to explain use. Please send patient home paper Rx for Xaralto to be used with coupon card.    Action/Plan:  No other CM needs identified.  Expected Discharge Date:  02/01/17               Expected Discharge Plan:  Home/Self Care  In-House Referral:     Discharge planning Services  CM Consult, Medication Assistance  Post Acute Care Choice:    Choice offered to:     DME Arranged:    DME Agency:     HH Arranged:    HH Agency:     Status of Service:  Completed, signed off  If discussed at [employer] of Stay Meetings, dates discussed:    Additional Comments:  Lawerance Sabal, RN 01/31/2017, 10:20 AM

## 2017-02-01 LAB — CBC
HEMATOCRIT: 39.8 % (ref 39.0–52.0)
HEMOGLOBIN: 13.5 g/dL (ref 13.0–17.0)
MCH: 31.8 pg (ref 26.0–34.0)
MCHC: 33.9 g/dL (ref 30.0–36.0)
MCV: 93.6 fL (ref 78.0–100.0)
Platelets: 247 10*3/uL (ref 150–400)
RBC: 4.25 MIL/uL (ref 4.22–5.81)
RDW: 12.9 % (ref 11.5–15.5)
WBC: 8 10*3/uL (ref 4.0–10.5)

## 2017-02-01 LAB — PROTIME-INR
INR: 1.02
Prothrombin Time: 13.3 seconds (ref 11.4–15.2)

## 2017-02-01 LAB — CARDIOLIPIN ANTIBODIES, IGG, IGM, IGA
Anticardiolipin IgA: <9 APL U/mL (ref 0–11)
Anticardiolipin IgG: <9 GPL U/mL (ref 0–14)

## 2017-02-01 LAB — HEPARIN LEVEL (UNFRACTIONATED): Heparin Unfractionated: 0.49 IU/mL (ref 0.30–0.70)

## 2017-02-01 MED ORDER — RIVAROXABAN 20 MG PO TABS: 20.0000 mg | ORAL_TABLET | Freq: Every day | ORAL | Status: DC

## 2017-02-01 MED ORDER — RIVAROXABAN (XARELTO) VTE STARTER PACK (15 & 20 MG): ORAL_TABLET | ORAL | 0 refills | Status: DC

## 2017-02-01 MED ORDER — RIVAROXABAN 15 MG PO TABS
15.0000 mg | ORAL_TABLET | Freq: Two times a day (BID) | ORAL | Status: DC
  Administered 2017-02-01 (×2): 15 mg via ORAL
  Filled 2017-02-01 (×2): qty 1

## 2017-02-01 MED ORDER — WARFARIN SODIUM 5 MG PO TABS: 10.0000 mg | ORAL_TABLET | Freq: Once | ORAL | Status: DC

## 2017-02-01 NOTE — Progress Notes (Signed)
ANTICOAGULATION CONSULT NOTE - follow up  Pharmacy Consult for XARELTO Indication: Pulmonary embolism  No Known Allergies  Patient Measurements: Height:  (175.3 cm) Weight: (!) 382 lb 6.4 oz (173.5 kg) IBW/kg (Calculated) : 70.7 HEPARIN DW (KG): 114.7  Vital Signs: Temp: 97.9 F (36.6 C) (10/07 0526) Temp Source: Oral (10/07 0526) BP: 112/61 (10/07 0526) Pulse Rate: 75 (10/07 0526)  Labs:  Recent Labs  01/29/17 1428  01/30/17 0405 01/31/17 0419 01/31/17 0420 02/01/17 0503 02/01/17 0506  HGB  --   < > 13.3  --  13.6  --  13.5  HCT  --   --  39.1  --  40.7  --  39.8  PLT  --   --  229  --  257  --  247  LABPROT  --   --   --   --  13.1  --  13.3  INR  --   --   --   --  1.00  --  1.02  HEPARINUNFRC 0.27*  < > 0.33 0.45  --  0.49  --   TROPONINI <0.03  --   --   --   --   --   --   < > = values in this interval not displayed. Estimated Creatinine Clearance: 138 mL/min (by C-G formula based on SCr of 1.17 mg/dL).  Medical History: Past Medical History:  Diagnosis Date  . Back pain   . Gallstones   . Hyperlipidemia   . Pre-diabetes    Prescriptions Prior to Admission  Medication Sig Dispense Refill Last Dose  . acetaminophen-codeine (TYLENOL #3) 300-30 MG tablet Take 1 tablet by mouth at bedtime as needed for moderate pain. 30 tablet 2 01/27/2017 at Unknown time  . Ascorbic Acid (VITAMIN C) 1000 MG tablet Take 1,000 mg by mouth daily.   Past Week at Unknown time  . Cholecalciferol (VITAMIN D) 2000 UNITS tablet Take 2,000 Units by mouth daily.   Past Week at Unknown time  . fluticasone (FLONASE) 50 MCG/ACT nasal spray Place 2 sprays into both nostrils daily. 16 g 2 Past Month at Unknown time  . ibuprofen (ADVIL,MOTRIN) 200 MG tablet Take 200 mg by mouth every 6 (six) hours as needed for mild pain or moderate pain.   01/27/2017 at Unknown time  . Misc Natural Products (GREEN TEA) TABS Take 315 mg by mouth daily.   Past Month at Unknown time  . Multiple Vitamin  (MULTIVITAMIN WITH MINERALS) TABS Take 1 tablet by mouth daily.   Past Week at Unknown time  . buPROPion (WELLBUTRIN XL) 150 MG 24 hr tablet Take 1 tablet (150 mg total) by mouth daily. (Patient not taking: Reported on 01/28/2017) 30 tablet 5 Not Taking at Unknown time  . escitalopram (LEXAPRO) 10 MG tablet Take 1 tablet (10 mg total) by mouth daily. (Patient not taking: Reported on 01/28/2017) 30 tablet 5 Not Taking at Unknown time  . phentermine 37.5 MG capsule Take 1 capsule (37.5 mg total) by mouth every morning. (Patient not taking: Reported on 01/28/2017) 30 capsule 2 Not Taking at Unknown time   Assessment: 36 yo male with history of anterior chest pain x 2 weeks. Acute bilateral pulmonary emboli seen on CT. Pt is obese.  Asked to transition to [facility].  Pt has been on IV Heparin and Warfarin      Goal of Therapy:  Monitor platelets by anticoagulation protocol: Yes   Plan:  Xarelto  po bid x 21 days then   once daily thereafter Provide written education Monitor for S/S of bleeding  Wayland Denis, RPH  02/01/2017,12:01 PM

## 2017-02-01 NOTE — Progress Notes (Signed)
Patient discharged home.  IVs removed  - WNL.  Reviewed DC instructions and medications.  Instructed to follow up with PCP in 2 weeks.  Advised on bleeding precautions and when to call MD.  Handouts given.  Emphasized importance of taking prescribed medicine to prevent further blood clots.  Verbalizes understanding.  Patient in NAD, awaiting arrival of ride

## 2017-02-01 NOTE — Progress Notes (Signed)
ANTICOAGULATION CONSULT NOTE - follow up  Pharmacy Consult for Heparin >> Warfarin Indication: Pulmonary embolism  No Known Allergies  Patient Measurements: Height:  (175.3 cm) Weight: (!) 382 lb 6.4 oz (173.5 kg) IBW/kg (Calculated) : 70.7 HEPARIN DW (KG): 114.7  Vital Signs: Temp: 97.9 F (36.6 C) (10/07 0526) Temp Source: Oral (10/07 0526) BP: 112/61 (10/07 0526) Pulse Rate: 75 (10/07 0526)  Labs:  Recent Labs  01/29/17 0909 01/29/17 1428  01/30/17 0405 01/31/17 0419 01/31/17 0420 02/01/17 0503 02/01/17 0506  HGB  --   --   < > 13.3  --  13.6  --  13.5  HCT  --   --   --  39.1  --  40.7  --  39.8  PLT  --   --   --  229  --  257  --  247  LABPROT  --   --   --   --   --  13.1  --  13.3  INR  --   --   --   --   --  1.00  --  1.02  HEPARINUNFRC  --  0.27*  < > 0.33 0.45  --  0.49  --   TROPONINI <0.03 <0.03  --   --   --   --   --   --   < > = values in this interval not displayed. Estimated Creatinine Clearance: 138 mL/min (by C-G formula based on SCr of 1.17 mg/dL).  Medical History: Past Medical History:  Diagnosis Date  . Back pain   . Gallstones   . Hyperlipidemia   . Pre-diabetes    Prescriptions Prior to Admission  Medication Sig Dispense Refill Last Dose  . acetaminophen-codeine (TYLENOL #3) 300-30 MG tablet Take 1 tablet by mouth at bedtime as needed for moderate pain. 30 tablet 2 01/27/2017 at Unknown time  . Ascorbic Acid (VITAMIN C) 1000 MG tablet Take 1,000 mg by mouth daily.   Past Week at Unknown time  . Cholecalciferol (VITAMIN D) 2000 UNITS tablet Take 2,000 Units by mouth daily.   Past Week at Unknown time  . fluticasone (FLONASE) 50 MCG/ACT nasal spray Place 2 sprays into both nostrils daily. 16 g 2 Past Month at Unknown time  . ibuprofen (ADVIL,MOTRIN) 200 MG tablet Take 200 mg by mouth every 6 (six) hours as needed for mild pain or moderate pain.   01/27/2017 at Unknown time  . Misc Natural Products (GREEN TEA) TABS Take 315 mg by  mouth daily.   Past Month at Unknown time  . Multiple Vitamin (MULTIVITAMIN WITH MINERALS) TABS Take 1 tablet by mouth daily.   Past Week at Unknown time  . buPROPion (WELLBUTRIN XL) 150 MG 24 hr tablet Take 1 tablet (150 mg total) by mouth daily. (Patient not taking: Reported on 01/28/2017) 30 tablet 5 Not Taking at Unknown time  . escitalopram (LEXAPRO) 10 MG tablet Take 1 tablet (10 mg total) by mouth daily. (Patient not taking: Reported on 01/28/2017) 30 tablet 5 Not Taking at Unknown time  . phentermine 37.5 MG capsule Take 1 capsule (37.5 mg total) by mouth every morning. (Patient not taking: Reported on 01/28/2017) 30 capsule 2 Not Taking at Unknown time   Assessment: 36 yo male with history of anterior chest pain x 2 weeks. Acute bilateral pulmonary emboli seen on CT. Pharmacy has been asked to dose IV heparin. HL is now therapeutic. Pt is obese.  Asked to transition to Warfarin.  Sluggish  INR response.    Goal of Therapy:  Heparin goal level: 0.3-0.7 units/ml Monitor platelets by anticoagulation protocol: Yes INR 2-3   Plan:  Continue Heparin infusion at 2500 units/hr Coumadin  today x 1 Check anti-Xa daily while on heparin w/ INR Monitor for S/S of bleeding  Valrie Hart A, RPH  02/01/2017,9:03 AM

## 2017-02-01 NOTE — Discharge Summary (Signed)
Physician Discharge Summary  Justin Schmidt ZOX:[id number] DOB: 05-Mar-1981 DOA: 01/28/2017  PCP: Justin Sheriff, MD  Admit date: 01/28/2017 Discharge date: 02/01/2017  Time spent: 45 minutes  Recommendations for Outpatient Follow-up:  -Will be discharged home today. -Advised to follow up with PCP in 2 weeks. -Started on Xarelto for PE.   Discharge Diagnoses:  Principal Problem:   Acute saddle pulmonary embolism without acute cor pulmonale (HCC) Active Problems:   Depression   Dyspnea on exertion   Morbid obesity with BMI of 50.0-59.9, adult [facility])   Discharge Condition: Stable and improved  Filed Weights   01/30/17 2106 01/31/17 0539 02/01/17 0526  Weight: (!) 174.9 kg (385 lb 8 oz) (!) 173.5 kg (382 lb 8 oz) (!) 173.5 kg (382 lb 6.4 oz)    History of present illness:  As per Dr. Selena Schmidt on 10/3:  Justin Schmidt  is a 36 y.o. male, w morbid obeisity, glucose intolerance, hyperlipidemia, apparently presented w dyspnea x 1 week. Denies fever, chills, cp, palp, n/v, diarrhea, brbpr.  Pt presented to ED for evaluation of dyspnea. Pt denies family hx of blood clots, no recent travel.   In ED,   Pt had CXR => mild streaky atelectasis.  CTA chest =>  IMPRESSION: 1. Acute pulmonary emboli involving the distal right main pulmonary artery with extension into multiple right upper, middle, and lower lobe pulmonary vessels in addition to multiple distal segmental and subsegmental emboli within the left upper and lower lobes. Positive for acute PE with CT evidence of right heart strain (RV/LV Ratio = 0.9) consistent with at least submassive (intermediate risk) PE. The presence of right heart strain has been associated with an increased risk of morbidity and mortality. Please activate Code PE by paging [phone].  ED spoke with PCCM who thought that TPA not needed, and recommended admission at [facility] hospital.   Hospital Course:   Acute PE without cor pulmonale  -Was initially placed  on Coumadin with heparin bridge. -Have discussed possibility of NOAC with patient and family. He has decided to switch to Xarelto. -Discussed with case management who has given patient a 30 day card for Xarelto. -CT suggested right heart strain, this was not evident on 2-D echo. -Patient is hemodynamically stable.   morbid obesity -Noted, patient encouraged to continue diet and exercise.  Depression -Mood is stable. Continue Lexapro and Wellbutrin.   Procedures:  None   Consultations:  None  Discharge Instructions  Discharge Instructions    Diet - low sodium heart healthy    Complete by:  As directed    Increase activity slowly    Complete by:  As directed      Allergies as of 02/01/2017   No Known Allergies     Medication List    STOP taking these medications   ibuprofen 200 MG tablet Commonly known as:  ADVIL,MOTRIN   phentermine 37.5 MG capsule     TAKE these medications   acetaminophen-codeine 300-30 MG tablet Commonly known as:  TYLENOL #3 Take 1 tablet by mouth at bedtime as needed for moderate pain.   buPROPion 150 MG 24 hr tablet Commonly known as:  WELLBUTRIN XL Take 1 tablet (150 mg total) by mouth daily.   escitalopram 10 MG tablet Commonly known as:  LEXAPRO Take 1 tablet (10 mg total) by mouth daily.   fluticasone 50 MCG/ACT nasal spray Commonly known as:  FLONASE Place 2 sprays into both nostrils daily.   Green Tea Tabs Take 315  mg by mouth daily.   multivitamin with minerals Tabs tablet Take 1 tablet by mouth daily.   Rivaroxaban 15 & 20 MG Tbpk Take as directed on package: Start with one  tablet by mouth twice a day with food. On Day 22, switch to one  tablet once a day with food.   vitamin C 1000 MG tablet Take 1,000 mg by mouth daily.   Vitamin D 2000 units tablet Take 2,000 Units by mouth daily.      No Known Allergies Follow-up Information    Justin Sheriff, MD. Schedule an appointment as soon as possible  for a visit in 2 week(s).   Specialty:  Pediatrics Contact information: [address] [place] [address] [phone]            The results of significant diagnostics from this hospitalization (including imaging, microbiology, ancillary and laboratory) are listed below for reference.    Significant Diagnostic Studies: Dg Chest 2 View  Result Date: 01/28/2017 CLINICAL DATA:  Chest pain EXAM: CHEST  2 VIEW COMPARISON:  01/21/2012 FINDINGS: Streaky atelectasis at the lingula and left base. No consolidation or effusion. Normal cardiomediastinal silhouette. No pneumothorax. Mild scoliosis of the spine. Surgical clips in the upper abdomen IMPRESSION: Mild streaky atelectasis at the lingula and left base Electronically Signed   By: Justin Schmidt M.D.   On: 01/28/2017 17:06   Ct Angio Chest Pe W And/or Wo Contrast  Result Date: 01/28/2017 CLINICAL DATA:  Anterior chest pain for 2 weeks EXAM: CT ANGIOGRAPHY CHEST WITH CONTRAST TECHNIQUE: Multidetector CT imaging of the chest was performed using the standard protocol during bolus administration of intravenous contrast. Multiplanar CT image reconstructions and MIPs were obtained to evaluate the vascular anatomy. CONTRAST:  100 mL Isovue 370 intravenous COMPARISON:  Radiograph 01/28/2017 FINDINGS: Cardiovascular: Satisfactory opacification of the pulmonary arteries to the segmental level. Filling defect within the distal right pulmonary artery with thrombus extending into right upper and lower lobe pulmonary artery. Multiple filling defects visualized within segmental and subsegmental branches of the right upper, right middle, and right lower lobes. Additional filling defects are visualized within distal segmental/subsegmental left upper lobe and left lower lobe branches consistent with emboli. RV LV ratio meet criteria for right heart strain at 0.9. No pericardial effusion.  Nonaneurysmal aorta. Mediastinum/Nodes: Midline trachea. No thyroid mass.  Nonspecific subcentimeter mediastinal lymph nodes. Lungs/Pleura: Lungs are clear. No pleural effusion or pneumothorax. Upper Abdomen: Hepatic steatosis. Surgical clips at the gallbladder fossa Musculoskeletal: No chest Treadway abnormality. No acute or significant osseous findings. Review of the MIP images confirms the above findings. IMPRESSION: 1. Acute pulmonary emboli involving the distal right main pulmonary artery with extension into multiple right upper, middle, and lower lobe pulmonary vessels in addition to multiple distal segmental and subsegmental emboli within the left upper and lower lobes. Positive for acute PE with CT evidence of right heart strain (RV/LV Ratio = 0.9) consistent with at least submassive (intermediate risk) PE. The presence of right heart strain has been associated with an increased risk of morbidity and mortality. Please activate Code PE by paging [phone]. Critical Value/emergent results were called by telephone at the time of interpretation on 01/28/2017 at 10:44 pm to Dr. Doug Sou , who verbally acknowledged these results. Electronically Signed   By: Justin Schmidt M.D.   On: 01/28/2017 22:44   US Venous Img Lower Bilateral  Result Date: 01/29/2017 CLINICAL DATA:  Acute pulmonary embolism, morbid obesity. EXAM: BILATERAL LOWER EXTREMITY VENOUS DOPPLER ULTRASOUND  TECHNIQUE: Gray-scale sonography with graded compression, as well as color Doppler and duplex ultrasound were performed to evaluate the lower extremity deep venous systems from the level of the common femoral vein and including the common femoral, femoral, profunda femoral, popliteal and calf veins including the posterior tibial, peroneal and gastrocnemius veins when visible. The superficial great saphenous vein was also interrogated. Spectral Doppler was utilized to evaluate flow at rest and with distal augmentation maneuvers in the common femoral, femoral and popliteal veins. COMPARISON:  None in PACs FINDINGS:  RIGHT LOWER EXTREMITY Common Femoral Vein: No evidence of thrombus. Normal compressibility, respiratory phasicity and response to augmentation. Saphenofemoral Junction: No evidence of thrombus. Normal compressibility and flow on color Doppler imaging. Profunda Femoral Vein: No evidence of thrombus. Normal compressibility and flow on color Doppler imaging. Femoral Vein: No evidence of thrombus. Normal compressibility, respiratory phasicity and response to augmentation. Popliteal Vein: No evidence of thrombus. Normal compressibility, respiratory phasicity and response to augmentation. Calf Veins: No evidence of thrombus. Normal compressibility and flow on color Doppler imaging. Superficial Great Saphenous Vein: No evidence of thrombus. Normal compressibility and flow on color Doppler imaging. Venous Reflux:  None. Other Findings:  None. LEFT LOWER EXTREMITY Common Femoral Vein: No evidence of thrombus. Normal compressibility, respiratory phasicity and response to augmentation. Saphenofemoral Junction: No evidence of thrombus. Normal compressibility and flow on color Doppler imaging. Profunda Femoral Vein: No evidence of thrombus. Normal compressibility and flow on color Doppler imaging. Femoral Vein: No evidence of thrombus. Normal compressibility, respiratory phasicity and response to augmentation. Popliteal Vein: No evidence of thrombus. Normal compressibility, respiratory phasicity and response to augmentation. Calf Veins: No evidence of thrombus. Normal compressibility and flow on color Doppler imaging. Superficial Great Saphenous Vein: No evidence of thrombus. Normal compressibility and flow on color Doppler imaging. Venous Reflux:  None. Other Findings:  None. IMPRESSION: No evidence of DVT within either lower extremity. Electronically Signed   By: David  [place] M.D.   On: 01/29/2017 14:05    Microbiology: Recent Results (from the past 240 hour(s))  MRSA PCR Screening     Status: None   Collection Time:  01/29/17  3:14 AM  Result Value Ref Range Status   MRSA by PCR NEGATIVE NEGATIVE Final    Comment:        The GeneXpert MRSA Assay (FDA approved for NASAL specimens only), is one component of a comprehensive MRSA colonization surveillance program. It is not intended to diagnose MRSA infection nor to guide or monitor treatment for MRSA infections.      Labs: Basic Metabolic Panel:  Recent Labs Lab 01/28/17 1910 01/29/17 0633  NA 137 137  K 3.8 3.5  CL 101 101  CO2 27 26  GLUCOSE 132* 116*  BUN 14 14  CREATININE 1.07 1.17  CALCIUM 9.2 8.9   Liver Function Tests:  Recent Labs Lab 01/29/17 0633  AST 23  ALT 42  ALKPHOS 54  BILITOT 0.7  PROT 6.9  ALBUMIN 3.6   No results for input(s): LIPASE, AMYLASE in the last 168 hours. No results for input(s): AMMONIA in the last 168 hours. CBC:  Recent Labs Lab 01/28/17 1910 01/29/17 0633 01/30/17 0405 01/31/17 0420 02/01/17 0506  WBC 7.1 8.1 7.3 7.6 8.0  HGB 14.0 13.4 13.3 13.6 13.5  HCT 41.2 39.2 39.1 40.7 39.8  MCV 92.4 92.9 92.0 93.8 93.6  PLT 241 204 229 257 247   Cardiac Enzymes:  Recent Labs Lab 01/28/17 1910 01/28/17 2241 01/29/17 0621 01/29/17 0909 01/29/17  1428  TROPONINI <0.03 <0.03 <0.03 <0.03 <0.03   BNP: BNP (last 3 results) No results for input(s): BNP in the last 8760 hours.  ProBNP (last 3 results) No results for input(s): PROBNP in the last 8760 hours.  CBG: No results for input(s): GLUCAP in the last 168 hours.     SignedChaya Jan  [facility] Pager: [phone] 02/01/2017, 3:44 PM

## 2017-02-01 NOTE — Progress Notes (Signed)
Patient ambulated approx 250 feet.  Tolerated well with minimal SOB, O2 sat 96% on RA

## 2017-02-01 NOTE — Discharge Instructions (Signed)
Information on my medicine - XARELTO (rivaroxaban)  This medication education was reviewed with me or my healthcare representative as part of my discharge preparation.  WHY WAS XARELTO PRESCRIBED FOR YOU? Xarelto was prescribed to treat blood clots that may have been found in the veins of your legs (deep vein thrombosis) or in your lungs (pulmonary embolism) and to reduce the risk of them occurring again.  What do you need to know about Xarelto? The starting dose is one 15 mg tablet taken TWICE daily with food for the FIRST 21 DAYS then on (enter date)  02/22/17  the dose is changed to one 20 mg tablet taken ONCE A DAY with your evening meal.  DO NOT stop taking Xarelto without talking to the health care provider who prescribed the medication.  Refill your prescription for 20 mg tablets before you run out.  After discharge, you should have regular check-up appointments with your healthcare provider that is prescribing your Xarelto.  In the future your dose may need to be changed if your kidney function changes by a significant amount.  What do you do if you miss a dose? If you are taking Xarelto TWICE DAILY and you miss a dose, take it as soon as you remember. You may take two 15 mg tablets (total 30 mg) at the same time then resume your regularly scheduled 15 mg twice daily the next day.  If you are taking Xarelto ONCE DAILY and you miss a dose, take it as soon as you remember on the same day then continue your regularly scheduled once daily regimen the next day. Do not take two doses of Xarelto at the same time.   Important Safety Information Xarelto is a blood thinner medicine that can cause bleeding. You should call your healthcare provider right away if you experience any of the following: ? Bleeding from an injury or your nose that does not stop. ? Unusual colored urine (red or dark brown) or unusual colored stools (red or black). ? Unusual bruising for unknown reasons. ? A  serious fall or if you hit your head (even if there is no bleeding).  Some medicines may interact with Xarelto and might increase your risk of bleeding while on Xarelto. To help avoid this, consult your healthcare provider or pharmacist prior to using any new prescription or non-prescription medications, including herbals, vitamins, non-steroidal anti-inflammatory drugs (NSAIDs) and supplements.  This website has more information on Xarelto: VisitDestination.com.br.

## 2017-02-02 LAB — PROTHROMBIN GENE MUTATION

## 2017-02-02 LAB — BETA-2-GLYCOPROTEIN I ABS, IGG/M/A
Beta-2 Glyco I IgG: <9 GPI IgG units (ref 0–20)
Beta-2-Glycoprotein I IgA: <9 GPI IgA units (ref 0–25)
Beta-2-Glycoprotein I IgM: <9 GPI IgM units (ref 0–32)

## 2017-02-02 LAB — FACTOR 5 LEIDEN

## 2017-02-25 ENCOUNTER — Ambulatory Visit (INDEPENDENT_AMBULATORY_CARE_PROVIDER_SITE_OTHER): Payer: Medicare Other | Admitting: Pediatrics

## 2017-02-25 ENCOUNTER — Encounter: Payer: Self-pay | Admitting: Pediatrics

## 2017-02-25 VITALS — BP 124/79 | HR 85 | Temp 97.1°F | Ht 69.0 in | Wt 396.0 lb

## 2017-02-25 DIAGNOSIS — I2692 Saddle embolus of pulmonary artery without acute cor pulmonale: Secondary | ICD-10-CM | POA: Diagnosis not present

## 2017-02-25 MED ORDER — RIVAROXABAN 20 MG PO TABS: 20.0000 mg | ORAL_TABLET | Freq: Every day | ORAL | 3 refills | Status: DC

## 2017-02-25 NOTE — Progress Notes (Signed)
  Subjective:   Patient ID: Justin Schmidt, male    DOB: 09-20-1980, 36 y.o.   MRN: [id number] CC: Hospitalization Follow-up  HPI: Justin Schmidt is a 36 y.o. male presenting for Hospitalization Follow-up  Admitted 4 weeks ago for acute PE Was going to the gym regularly, MWFs, mostly weight lifting One morning woke up and had SOB In ED found to have PE, evidence of R heart strain on CT scan ECHO was normal Mother with him in clinic today, had PE that same week Maternal uncle had blood clots also with hodkins lymphoma, colon cancer and chest ca mother describes Maternal great aunt and her son with blood clots, no cancer history  Leg pain has resolved Has been taking xarelto regularly, recently switched to 20mg  once a day per starter pack No trouble getting access to medications SOB has improved  Continues to drink some sodas Interested in getting back to exercising Declined nutrition consult in the past Has phone number to call to set back up  Relevant past medical, surgical, family and social history reviewed. Allergies and medications reviewed and updated. History  Smoking Status  . Never Smoker  Smokeless Tobacco  . Never Used   ROS: Per HPI   Objective:    BP 124/79   Pulse 85   Temp (!) 97.1 F (36.2 C) (Oral)   Ht 5\' 9"  (1.753 m)   Wt (!) 396 lb (179.6 kg)   BMI 58.48 kg/m   Wt Readings from Last 3 Encounters:  02/25/17 (!) 396 lb (179.6 kg)  02/01/17 (!) 382 lb 6.4 oz (173.5 kg)  12/12/16 (!) 388 lb (176 kg)    Gen: NAD, alert, cooperative with exam, NCAT EYES: EOMI, no conjunctival injection, or no icterus ENT:  OP without erythema LYMPH: no cervical LAD CV: NRRR, normal S1/S2, no murmur, distal pulses 2+ b/l Resp: CTABL, no wheezes, normal WOB Abd: +BS, soft, NTND Ext: trace pitting edema b/l LE, warm Neuro: Alert and oriented  Assessment & Plan:  Justin Schmidt was seen today for hospitalization follow-up. I have reviewed the records, labs and  imaging.  Diagnoses and all orders for this visit:  Acute saddle pulmonary embolism without acute cor pulmonale (HCC) Unprovoked, +family hx of blood clots. Initial coagulation work up in acute setting in hospital was negative. Cont xarelto. -     rivaroxaban (XARELTO) 20 MG TABS tablet; Take 1 tablet (20 mg total) by mouth daily with supper. -     Ambulatory referral to Hematology  Severe obesity (BMI >= 40) (HCC) Cont to encourage wt loss Stop regular sodas OK to start back gentle exercise, walking on treadmill  Follow up plan: Return in about 6 weeks (around 04/08/2017). Rex Krasarol Vincent, MD [facility]

## 2017-03-03 ENCOUNTER — Ambulatory Visit: Payer: Medicare Other | Admitting: Vascular Surgery

## 2017-03-11 ENCOUNTER — Telehealth (HOSPITAL_COMMUNITY): Payer: Self-pay

## 2017-03-12 ENCOUNTER — Encounter: Payer: Self-pay | Admitting: Pediatrics

## 2017-03-12 ENCOUNTER — Ambulatory Visit (INDEPENDENT_AMBULATORY_CARE_PROVIDER_SITE_OTHER): Payer: Medicare Other | Admitting: Pediatrics

## 2017-03-12 VITALS — BP 128/73 | HR 87 | Temp 97.6°F | Resp 22 | Ht 69.0 in | Wt 377.4 lb

## 2017-03-12 DIAGNOSIS — J189 Pneumonia, unspecified organism: Secondary | ICD-10-CM

## 2017-03-12 MED ORDER — AZITHROMYCIN 250 MG PO TABS: ORAL_TABLET | ORAL | 0 refills | Status: DC

## 2017-03-12 NOTE — Progress Notes (Signed)
  Subjective:   Patient ID: Justin Schmidt, male    DOB: July 09, 1980, 36 y.o.   MRN: [id number] CC: Cough (1 week); Fever; and Sore Throat  HPI: Justin CullensJames C Bordon is a 36 y.o. male presenting for Cough (1 week); Fever; and Sore Throat  Subjective fevers at home SOB at times, but no worse than usual Coughing a lot Often productive Some sore thraot Minimal sinus drainage No ear pain  Appetite down  Taking meds regularly, has not missed any doses of xarelto for recent PE No chest pain  Relevant past medical, surgical, family and social history reviewed. Allergies and medications reviewed and updated. Social History   Tobacco Use  Smoking Status Never Smoker  Smokeless Tobacco Never Used   ROS: Per HPI   Objective:    BP 128/73   Pulse 87   Temp 97.6 F (36.4 C) (Oral)   Resp (!) 22   Ht 5\' 9"  (1.753 m)   Wt (!) 377 lb 6.4 oz (171.2 kg)   SpO2 93%   BMI 55.73 kg/m   Wt Readings from Last 3 Encounters:  03/12/17 (!) 377 lb 6.4 oz (171.2 kg)  02/25/17 (!) 396 lb (179.6 kg)  02/01/17 (!) 382 lb 6.4 oz (173.5 kg)    Gen: NAD, alert, cooperative with exam, NCAT EYES: EOMI, no conjunctival injection, or no icterus ENT: L TM red, bulging, R TM nl, OP with mild erythema LYMPH: no cervical LAD CV: NRRR, normal S1/S2, no murmur, distal pulses 2+ b/l Resp: diminished breat sounds, moving air well,  no wheezes, comfortable WOB Neuro: Alert and oriented MSK: normal muscle bulk  Assessment & Plan:  Justin Schmidt was seen today for cough, fever and sore throat.  Diagnoses and all orders for this visit:  Community acquired pneumonia, unspecified laterality Treat with below, return precautions discussed -     azithromycin (ZITHROMAX) 250 MG tablet; Take 2 the first day and then one each day after.   Follow up plan: As scheduled Justin Krasarol Vincent, MD [facility]

## 2017-03-14 ENCOUNTER — Emergency Department (HOSPITAL_COMMUNITY)
Admission: EM | Admit: 2017-03-14 | Discharge: 2017-03-14 | Disposition: A | Discharge status: Home / Self Care | Payer: Medicare Other | Attending: Emergency Medicine | Admitting: Emergency Medicine

## 2017-03-14 ENCOUNTER — Encounter (HOSPITAL_COMMUNITY): Payer: Self-pay | Admitting: Emergency Medicine

## 2017-03-14 ENCOUNTER — Emergency Department (HOSPITAL_COMMUNITY): Payer: Medicare Other

## 2017-03-14 DIAGNOSIS — I2692 Saddle embolus of pulmonary artery without acute cor pulmonale: Secondary | ICD-10-CM | POA: Diagnosis not present

## 2017-03-14 DIAGNOSIS — R55 Syncope and collapse: Secondary | ICD-10-CM

## 2017-03-14 DIAGNOSIS — Z79899 Other long term (current) drug therapy: Secondary | ICD-10-CM | POA: Diagnosis not present

## 2017-03-14 DIAGNOSIS — R05 Cough: Secondary | ICD-10-CM | POA: Diagnosis not present

## 2017-03-14 DIAGNOSIS — Z7901 Long term (current) use of anticoagulants: Secondary | ICD-10-CM | POA: Diagnosis not present

## 2017-03-14 HISTORY — DX: Acute embolism and thrombosis of unspecified deep veins of unspecified lower extremity: I82.409

## 2017-03-14 HISTORY — DX: Other pulmonary embolism without acute cor pulmonale: I26.99

## 2017-03-14 LAB — CBC WITH DIFFERENTIAL/PLATELET
BASOS PCT: 1 %
Basophils Absolute: 0.1 10*3/uL (ref 0.0–0.1)
EOS ABS: 0.2 10*3/uL (ref 0.0–0.7)
EOS PCT: 2 %
HCT: 40.4 % (ref 39.0–52.0)
HEMOGLOBIN: 13.4 g/dL (ref 13.0–17.0)
Lymphocytes Relative: 18 %
Lymphs Abs: 1.9 10*3/uL (ref 0.7–4.0)
MCH: 31.2 pg (ref 26.0–34.0)
MCHC: 33.2 g/dL (ref 30.0–36.0)
MCV: 94.2 fL (ref 78.0–100.0)
MONOS PCT: 10 %
Monocytes Absolute: 1.1 10*3/uL — ABNORMAL HIGH (ref 0.1–1.0)
NEUTROS PCT: 69 %
Neutro Abs: 7.3 10*3/uL (ref 1.7–7.7)
PLATELETS: 374 10*3/uL (ref 150–400)
RBC: 4.29 MIL/uL (ref 4.22–5.81)
RDW: 12.9 % (ref 11.5–15.5)
WBC: 10.5 10*3/uL (ref 4.0–10.5)

## 2017-03-14 LAB — COMPREHENSIVE METABOLIC PANEL
ALBUMIN: 3.8 g/dL (ref 3.5–5.0)
ALK PHOS: 62 U/L (ref 38–126)
ALT: 32 U/L (ref 17–63)
ANION GAP: 7 (ref 5–15)
AST: 27 U/L (ref 15–41)
BUN: 13 mg/dL (ref 6–20)
CHLORIDE: 103 mmol/L (ref 101–111)
CO2: 28 mmol/L (ref 22–32)
Calcium: 9.1 mg/dL (ref 8.9–10.3)
Creatinine, Ser: 1.16 mg/dL (ref 0.61–1.24)
GFR calc non Af Amer: >60 mL/min (ref >60)
GLUCOSE: 101 mg/dL — AB (ref 65–99)
POTASSIUM: 3.9 mmol/L (ref 3.5–5.1)
SODIUM: 138 mmol/L (ref 135–145)
Total Bilirubin: 0.7 mg/dL (ref 0.3–1.2)
Total Protein: 7.6 g/dL (ref 6.5–8.1)

## 2017-03-14 MED ORDER — SODIUM CHLORIDE 0.9 % IV BOLUS (SEPSIS)
500.0000 mL | Freq: Once | INTRAVENOUS | Status: AC
Start: 2017-03-14 — End: 2017-03-14
  Administered 2017-03-14: 500 mL via INTRAVENOUS

## 2017-03-14 NOTE — Discharge Instructions (Signed)
Follow-up with your doctor this week if any problems.  Drink plenty of fluids and rest

## 2017-03-14 NOTE — ED Provider Notes (Signed)
[facility] Provider Note   CSN: [id number] Arrival date & time: 03/14/17  1522     History   Chief Complaint Chief Complaint  Patient presents with  . Near Syncope    HPI Justin Schmidt is a 36 y.o. male.  Patient states that he has had a cough congestion for a number days he is presently on Xarelto for PE.  He has had a few episodes of coughing and then just passing out.   The history is provided by the patient. No language interpreter was used.  Near Syncope  This is a new problem. The current episode started 2 days ago. The problem occurs rarely. The problem has been resolved. Pertinent negatives include no chest pain, no abdominal pain and no headaches. Nothing aggravates the symptoms.    Past Medical History:  Diagnosis Date  . Back pain   . DVT (deep venous thrombosis) (HCC)   . Gallstones   . Hyperlipidemia   . Pre-diabetes   . Pulmonary emboli [facility](HCC)     Patient Active Problem List   Diagnosis Date Noted  . Dyspnea on exertion 01/30/2017  . Acute saddle pulmonary embolism without acute cor pulmonale (HCC) 01/30/2017  . Morbid obesity with BMI of 50.0-59.9, adult (HCC) 01/30/2017  . Pain management contract agreement 09/11/2016  . Chronic back pain 09/11/2016  . Depression 09/11/2016  . Fatty liver 05/17/2015  . Elevated transaminase level 05/17/2015  . Stress at home 05/17/2015  . Metabolic syndrome 02/01/2015  . Hypertriglyceridemia 09/05/2013    Past Surgical History:  Procedure Laterality Date  . APPENDECTOMY  1991  . LAPAROSCOPIC CHOLECYSTECTOMY N/A 01/23/2012   Performed by Fabio BeringZiegler, Brent C, MD at [facility]  . TONSILLECTOMY         Home Medications    Prior to Admission medications   Medication Sig Start Date End Date Taking? Authorizing Provider  acetaminophen-codeine (TYLENOL #3) 300-30 MG tablet Take 1 tablet by mouth at bedtime as needed for moderate pain. 12/12/16   Johna SheriffVincent, Carol L, MD  Ascorbic Acid (VITAMIN C)  1000 MG tablet Take 1,000 mg by mouth daily.    [provider]  azithromycin (ZITHROMAX) 250 MG tablet Take 2 the first day and then one each day after. 03/12/17   Johna SheriffVincent, Carol L, MD  buPROPion (WELLBUTRIN XL) 150 MG 24 hr tablet Take 1 tablet (150 mg total) by mouth daily. 12/12/16   Johna SheriffVincent, Carol L, MD  Cholecalciferol (VITAMIN D) 2000 UNITS tablet Take 2,000 Units by mouth daily.    [provider]  escitalopram (LEXAPRO) 10 MG tablet Take 1 tablet (10 mg total) by mouth daily. 12/12/16   Johna SheriffVincent, Carol L, MD  fluticasone (FLONASE) 50 MCG/ACT nasal spray Place 2 sprays into both nostrils daily. 03/20/15   Johna SheriffVincent, Carol L, MD  Misc Natural Products (GREEN TEA) TABS Take 315 mg by mouth daily. 05/12/14   Henrene PastorEckard, Tammy, PharmD  Multiple Vitamin (MULTIVITAMIN WITH MINERALS) TABS Take 1 tablet by mouth daily.    [provider]  rivaroxaban (XARELTO) 20 MG TABS tablet Take 1 tablet (20 mg total) by mouth daily with supper. 02/25/17   Johna SheriffVincent, Carol L, MD    Family History Family History  Problem Relation Age of Onset  . Diabetes Mother   . Diabetes Father   . Hypertension Father   . Hyperlipidemia Father   . Migraines Sister   . Diabetes Maternal Uncle   . Diabetes Maternal Grandmother   . Diabetes Other  Social History Social History   Tobacco Use  . Smoking status: Never Smoker  . Smokeless tobacco: Never Used  Substance Use Topics  . Alcohol use: Yes    Alcohol/week: 0.6 oz    Types: 1 Cans of beer per week    Comment: very rare  . Drug use: No     Allergies   Patient has no known allergies.   Review of Systems Review of Systems  Constitutional: Negative for appetite change and fatigue.  HENT: Negative for congestion, ear discharge and sinus pressure.   Eyes: Negative for discharge.  Respiratory: Positive for cough.   Cardiovascular: Positive for near-syncope. Negative for chest pain.  Gastrointestinal: Negative for abdominal pain and  diarrhea.  Genitourinary: Negative for frequency and hematuria.  Musculoskeletal: Negative for back pain.  Skin: Negative for rash.  Neurological: Negative for seizures and headaches.  Psychiatric/Behavioral: Negative for hallucinations.     Physical Exam Updated Vital Signs BP 112/61   Pulse 78   Temp 97.6 F (36.4 C) (Oral)   Resp (!) 25   Ht 5\' 9"  (1.753 m)   Wt (!) 171.5 kg (378 lb)   SpO2 96%   BMI 55.82 kg/m   Physical Exam  Constitutional: He is oriented to person, place, and time. He appears well-developed.  HENT:  Head: Normocephalic.  Eyes: Conjunctivae and EOM are normal. No scleral icterus.  Neck: Neck supple. No thyromegaly present.  Cardiovascular: Normal rate and regular rhythm. Exam reveals no gallop and no friction rub.  No murmur heard. Pulmonary/Chest: No stridor. He has no wheezes. He has no rales. He exhibits no tenderness.  Abdominal: He exhibits no distension. There is no tenderness. There is no rebound.  Musculoskeletal: Normal range of motion. He exhibits no edema.  Lymphadenopathy:    He has no cervical adenopathy.  Neurological: He is oriented to person, place, and time. He exhibits normal muscle tone. Coordination normal.  Skin: No rash noted. No erythema.  Psychiatric: He has a normal mood and affect. His behavior is normal.     ED Treatments / Results  Labs (all labs ordered are listed, but only abnormal results are displayed) Labs Reviewed  CBC WITH DIFFERENTIAL/PLATELET - Abnormal; Notable for the following components:      Result Value   Monocytes Absolute 1.1 (*)    All other components within normal limits  COMPREHENSIVE METABOLIC PANEL - Abnormal; Notable for the following components:   Glucose, Bld 101 (*)    All other components within normal limits    EKG  EKG Interpretation None       Radiology Dg Chest 2 View  Result Date: 03/14/2017 CLINICAL DATA:  Cough and congestion for few days EXAM: CHEST  2 VIEW  COMPARISON:  01/28/2017 FINDINGS: Cardiac shadow is within normal limits. The lungs are well aerated bilaterally. No focal infiltrate or sizable effusion is seen. No acute bony abnormality is noted. IMPRESSION: No active cardiopulmonary disease. Electronically Signed   By: Alcide Clever M.D.   On: 03/14/2017 16:53    Procedures Procedures (including critical care time)  Medications Ordered in ED Medications  sodium chloride 0.9 % bolus 500 mL (500 mLs Intravenous New Bag/Given 03/14/17 1602)     Initial Impression / Assessment and Plan / ED Course  I have reviewed the triage vital signs and the nursing notes.  Pertinent labs & imaging results that were available during my care of the patient were reviewed by me and considered in my medical decision making (  see chart for details).     Patient's lab chest x-ray EKG all unremarkable.  Suspect patient having near syncopal episodes from coughing and respiratory infection and treatment for PE.  He will follow-up with his PCP  Final Clinical Impressions(s) / ED Diagnoses   Final diagnoses:  Near syncope    ED Discharge Orders    None       Bethann BerkshireZammit, Denece Shearer, MD 03/14/17 [phone]

## 2017-03-14 NOTE — ED Triage Notes (Signed)
Pt reports not feeling well for the past few days with cough, congestion, nose bleeds, fatigue, and aches.  2 syncopal episodes yesterday.  Pt has recent hx of PEs and is on Xarelto.

## 2017-03-16 ENCOUNTER — Telehealth: Payer: Self-pay | Admitting: Pediatrics

## 2017-03-16 NOTE — Telephone Encounter (Signed)
[facility]

## 2017-03-17 ENCOUNTER — Encounter: Payer: Self-pay | Admitting: Family

## 2017-03-17 ENCOUNTER — Ambulatory Visit (INDEPENDENT_AMBULATORY_CARE_PROVIDER_SITE_OTHER): Payer: Medicare Other | Admitting: Family

## 2017-03-17 VITALS — BP 110/71 | HR 79 | Temp 97.8°F | Ht 69.0 in | Wt 379.2 lb

## 2017-03-17 DIAGNOSIS — Z09 Encounter for follow-up examination after completed treatment for conditions other than malignant neoplasm: Secondary | ICD-10-CM | POA: Diagnosis not present

## 2017-03-17 DIAGNOSIS — J209 Acute bronchitis, unspecified: Secondary | ICD-10-CM | POA: Diagnosis not present

## 2017-03-17 DIAGNOSIS — R05 Cough: Secondary | ICD-10-CM | POA: Diagnosis not present

## 2017-03-17 DIAGNOSIS — R059 Cough, unspecified: Secondary | ICD-10-CM

## 2017-03-17 LAB — VERITOR FLU A/B WAIVED
INFLUENZA B: NEGATIVE
Influenza A: NEGATIVE

## 2017-03-17 MED ORDER — BENZONATATE 200 MG PO CAPS: 200.0000 mg | ORAL_CAPSULE | Freq: Three times a day (TID) | ORAL | 1 refills | Status: DC | PRN

## 2017-03-17 MED ORDER — PREDNISONE 10 MG (21) PO TBPK: ORAL_TABLET | ORAL | 0 refills | Status: DC

## 2017-03-17 NOTE — Progress Notes (Signed)
Subjective:    Patient ID: Justin Schmidt, male    DOB: 16-Sep-1980, 36 y.o.   MRN: [id number]  Pt presents to the office today with complaints and near syncope. Pt went to the ED on 03/14/17 and had negative chest x-ray and EKG. He was diagnosed with Respiratory infection.   Pt completed Zpak yesterday and continuing Mucinex. Pt complaining of a constant nonproductive cough Cough  The current episode started 1 to 4 weeks ago. The problem has been unchanged. The problem occurs every few minutes. The cough is non-productive. Associated symptoms include ear pain (right), headaches, myalgias, rhinorrhea, a sore throat, shortness of breath and wheezing. Pertinent negatives include no chills, ear congestion or fever. The symptoms are aggravated by lying down. He has tried rest and OTC cough suppressant for the symptoms. The treatment provided mild relief.   [facility].  *Hospital notes reviewed.   Review of Systems  Constitutional: Negative for chills and fever.  HENT: Positive for ear pain (right), rhinorrhea and sore throat.   Respiratory: Positive for cough, shortness of breath and wheezing.   Musculoskeletal: Positive for myalgias.  Neurological: Positive for headaches.  All other systems reviewed and are negative.      Objective:   Physical Exam  Constitutional: He is oriented to person, place, and time. He appears well-developed and well-nourished. No distress.  HENT:  Head: Normocephalic.  Right Ear: There is tenderness. Tympanic membrane is erythematous.  Left Ear: External ear normal.  Nose: Mucosal edema and rhinorrhea present.  Mouth/Throat: Posterior oropharyngeal erythema present.  Eyes: Pupils are equal, round, and reactive to light. Right eye exhibits no discharge. Left eye exhibits no discharge.  Neck: Normal range of motion. Neck supple. No thyromegaly present.  Cardiovascular: Normal rate, regular rhythm, normal heart sounds and intact distal pulses.  No murmur  heard. Pulmonary/Chest: Effort normal and breath sounds normal. No respiratory distress. He has no wheezes.  Intermittent nonproductive  cough   Abdominal: Soft. Bowel sounds are normal. He exhibits no distension. There is no tenderness.  Musculoskeletal: Normal range of motion. He exhibits no edema or tenderness.  Neurological: He is alert and oriented to person, place, and time.  Skin: Skin is warm and dry. No rash noted. No erythema.  Psychiatric: He has a normal mood and affect. His behavior is normal. Judgment and thought content normal.  Vitals reviewed.    BP 110/71   Pulse 79   Temp 97.8 F (36.6 C) (Oral)   Ht 5\' 9"  (1.753 m)   Wt (!) 379 lb 3.2 oz (172 kg)   SpO2 96%   BMI 56.00 kg/m      Assessment & Plan:  1. Cough - Veritor Flu A/B Waived  2. Hospital discharge follow-up  3. Acute bronchitis, unspecified organism - Take meds as prescribed - Use a cool mist humidifier  -Use saline nose sprays frequently -Saline irrigations of the nose can be very helpful if done frequently. -Force fluids -For any cough or congestion  Use plain Mucinex- regular strength or max strength is fine -For fever or aces or pains- take tylenol or ibuprofen appropriate for age and weight. -Throat lozenges if help -New toothbrush in 3 days - predniSONE (STERAPRED UNI-PAK 21 TAB) 10 MG (21) TBPK tablet; Use as directed  Dispense: 21 tablet; Refill: 0 - benzonatate (TESSALON) 200 MG capsule; Take 1 capsule (200 mg total) 3 (three) times daily as needed by mouth.  Dispense: 30 capsule; Refill: 1     Jannifer Rodneyhristy Hawks,  FNP  

## 2017-03-17 NOTE — Patient Instructions (Signed)

## 2017-03-26 ENCOUNTER — Ambulatory Visit (INDEPENDENT_AMBULATORY_CARE_PROVIDER_SITE_OTHER): Payer: Medicare Other | Admitting: Pediatrics

## 2017-03-26 ENCOUNTER — Encounter: Payer: Self-pay | Admitting: Pediatrics

## 2017-03-26 VITALS — BP 110/64 | HR 87 | Temp 97.8°F | Resp 24 | Ht 69.0 in | Wt 388.0 lb

## 2017-03-26 DIAGNOSIS — J069 Acute upper respiratory infection, unspecified: Secondary | ICD-10-CM

## 2017-03-26 DIAGNOSIS — G8929 Other chronic pain: Secondary | ICD-10-CM

## 2017-03-26 DIAGNOSIS — J209 Acute bronchitis, unspecified: Secondary | ICD-10-CM

## 2017-03-26 DIAGNOSIS — M549 Dorsalgia, unspecified: Secondary | ICD-10-CM

## 2017-03-26 MED ORDER — BENZONATATE 200 MG PO CAPS: 200.0000 mg | ORAL_CAPSULE | Freq: Three times a day (TID) | ORAL | 0 refills | Status: DC | PRN

## 2017-03-26 MED ORDER — ACETAMINOPHEN-CODEINE #3 300-30 MG PO TABS: 1.0000 | ORAL_TABLET | Freq: Every evening | ORAL | 2 refills | Status: DC | PRN

## 2017-03-26 NOTE — Progress Notes (Signed)
  Subjective:   Patient ID: Wallace CullensJames C Pancake, male    DOB: 1980-11-30, 36 y.o.   MRN: [id number] CC: Cough; Sore Throat; and Mid back pain  HPI: Wallace CullensJames C Moates is a 36 y.o. male presenting for Cough; Sore Throat; and Mid back pain  Has been sick with cough for approximately the past month Treated with azithromycin acquired pneumonia Seen in the emergency room with cough syncope Put on prednisone taper for bronchitis Thinks overall the last week he has been getting better until this morning he woke up with a sore throat Better now Continues to have some dry cough Taking Tessalon Perles helped a few weeks ago but is now out No fever Appetite is ok  Indication for chronic opioid: low back pain, arthritis Medication and dose: tylenol #3, takes once a day at night prn # pills per month: 30 Last UDS date: 08/2016 Pain contract signed (Y/N): y Date narcotic database (include red flags): reviewed, no red flags Helps improve his quality of life  On xarelto fo PE  Relevant past medical, surgical, family and social history reviewed. Allergies and medications reviewed and updated. Social History   Tobacco Use  Smoking Status Never Smoker  Smokeless Tobacco Never Used   ROS: Per HPI   Objective:    BP 110/64   Pulse 87   Temp 97.8 F (36.6 C) (Oral)   Resp (!) 24   Ht 5\' 9"  (1.753 m)   Wt (!) 388 lb (176 kg)   SpO2 95%   BMI 57.30 kg/m   Wt Readings from Last 3 Encounters:  03/26/17 (!) 388 lb (176 kg)  03/17/17 (!) 379 lb 3.2 oz (172 kg)  03/14/17 (!) 378 lb (171.5 kg)   Gen: NAD, alert, cooperative with exam, NCAT EYES: EOMI, no conjunctival injection, or no icterus ENT:  TMs dull gray b/l, OP with mild erythema LYMPH: no cervical LAD CV: NRRR, normal S1/S2, no murmur, distal pulses 2+ b/l Resp: CTABL, no wheezes, normal WOB Abd: +BS, soft, NTND. no guarding or organomegaly Ext: trace pitting edema b/l LE, warm Neuro: Alert and oriented, strength equal b/l UE and LE,  coordination grossly normal MSK: normal muscle bulk  Assessment & Plan:  [name] was seen today for cough, sore throat and mid back pain.  Diagnoses and all orders for this visit:  Acute URI Discussed symptomatic care  Acute bronchitis, unspecified organism Below helping with symptoms -     benzonatate (TESSALON) 200 MG capsule; Take 1 capsule (200 mg total) by mouth 3 (three) times daily as needed.  Chronic back pain, unspecified back location, unspecified back pain laterality Stable, takes below as needed -     acetaminophen-codeine (TYLENOL #3) 300-30 MG tablet; Take 1 tablet by mouth at bedtime as needed for moderate pain.    Follow up plan: No Follow-up on file. Rex Krasarol Kvion Shapley, MD [facility]

## 2017-03-27 ENCOUNTER — Encounter: Payer: Self-pay | Admitting: Pediatrics

## 2017-03-30 ENCOUNTER — Ambulatory Visit (INDEPENDENT_AMBULATORY_CARE_PROVIDER_SITE_OTHER): Payer: Medicare Other | Admitting: Pediatrics

## 2017-03-30 ENCOUNTER — Encounter: Payer: Self-pay | Admitting: Pediatrics

## 2017-03-30 ENCOUNTER — Ambulatory Visit (HOSPITAL_COMMUNITY): Payer: Medicare Other

## 2017-03-30 VITALS — BP 110/69 | HR 114 | Temp 97.5°F | Resp 24 | Ht 69.0 in | Wt 386.0 lb

## 2017-03-30 DIAGNOSIS — J189 Pneumonia, unspecified organism: Secondary | ICD-10-CM | POA: Diagnosis not present

## 2017-03-30 DIAGNOSIS — J209 Acute bronchitis, unspecified: Secondary | ICD-10-CM

## 2017-03-30 MED ORDER — BENZONATATE 200 MG PO CAPS: 200.0000 mg | ORAL_CAPSULE | Freq: Three times a day (TID) | ORAL | 0 refills | Status: DC | PRN

## 2017-03-30 MED ORDER — LEVOFLOXACIN 500 MG PO TABS: 500.0000 mg | ORAL_TABLET | Freq: Every day | ORAL | 0 refills | Status: DC

## 2017-03-30 NOTE — Progress Notes (Signed)
  Subjective:   Patient ID: Justin Schmidt, male    DOB: 11/14/1980, 36 y.o.   MRN: [id number] CC: Cough and Sore Throat  HPI: Justin Schmidt is a 36 y.o. male presenting for Cough and Sore Throat  Has been sick for the last 3-4 weeks Coughing improved initially since visit last week with the Tessalon Perles No longer having syncope with coughing Now having more productive cough, coughing up yellow Feeling more short of breath with exertion Has been treated with azithromycin 11/15  Has been taking Xarelto regularly, recently started for acute PE Was given prednisone with minimal improvement of symptoms a couple weeks ago  Relevant past medical, surgical, family and social history reviewed. Allergies and medications reviewed and updated. Social History   Tobacco Use  Smoking Status Never Smoker  Smokeless Tobacco Never Used   ROS: Per HPI   Objective:    BP 110/69   Pulse (!) 114   Temp (!) 97.5 F (36.4 C) (Oral)   Resp (!) 24   Ht 5\' 9"  (1.753 m)   Wt (!) 386 lb (175.1 kg)   SpO2 94%   BMI 57.00 kg/m   Wt Readings from Last 3 Encounters:  03/30/17 (!) 386 lb (175.1 kg)  03/26/17 (!) 388 lb (176 kg)  03/17/17 (!) 379 lb 3.2 oz (172 kg)   Gen: NAD, alert, cooperative with exam, NCAT EYES: EOMI, no conjunctival injection, or no icterus ENT:  TMs pearly gray b/l, OP without erythema LYMPH: no cervical LAD CV: NRRR, normal S1/S2, no murmur, distal pulses 2+ b/l Resp: distant breath sounds, no wheezes, normal WOB Abd: +BS, soft, NTND. no guarding or organomegaly Ext: No pitting edema, warm Neuro: Alert and oriented MSK: normal muscle bulk  Assessment & Plan:  Justin Schmidt was seen today for cough and sore throat.  Diagnoses and all orders for this visit:  Community acquired pneumonia, unspecified laterality -     levofloxacin (LEVAQUIN) 500 MG tablet; Take 1 tablet (500 mg total) by mouth daily.  Acute bronchitis, unspecified organism -     benzonatate (TESSALON) 200 MG  capsule; Take 1 capsule (200 mg total) by mouth 3 (three) times daily as needed.   Follow up plan: 8 weeks, sooner as needed, return precautions discussed Rex Krasarol Vincent, MD [facility]

## 2017-04-01 ENCOUNTER — Encounter: Payer: Self-pay | Admitting: Pediatrics

## 2017-04-01 ENCOUNTER — Ambulatory Visit: Payer: Medicare Other | Admitting: Pediatrics

## 2017-04-10 ENCOUNTER — Ambulatory Visit (HOSPITAL_COMMUNITY): Payer: Medicare Other

## 2017-04-15 ENCOUNTER — Telehealth (HOSPITAL_COMMUNITY): Payer: Self-pay

## 2017-04-15 NOTE — Telephone Encounter (Signed)
Writer left a voice mail message 

## 2017-04-15 NOTE — Telephone Encounter (Addendum)
[facility]   Writer left a voice mail message.  

## 2017-04-23 ENCOUNTER — Telehealth (HOSPITAL_COMMUNITY): Payer: Self-pay

## 2017-04-23 NOTE — Telephone Encounter (Signed)
[facility]   The patient voice mail system is not set up.  Writer sent the referring provider a staff message that attempts were made on 04/15/2017 and 04/23/2017 in order to reach the pateint.

## 2017-05-04 ENCOUNTER — Encounter (HOSPITAL_COMMUNITY): Payer: Self-pay | Admitting: Hematology and Oncology

## 2017-05-04 ENCOUNTER — Other Ambulatory Visit: Payer: Self-pay

## 2017-05-04 ENCOUNTER — Inpatient Hospital Stay (HOSPITAL_COMMUNITY): Payer: Medicare Other | Attending: Hematology and Oncology | Admitting: Hematology and Oncology

## 2017-05-04 VITALS — BP 118/75 | HR 85 | Temp 98.1°F | Resp 20 | Ht 69.0 in | Wt 389.1 lb

## 2017-05-04 DIAGNOSIS — I2699 Other pulmonary embolism without acute cor pulmonale: Secondary | ICD-10-CM | POA: Diagnosis not present

## 2017-05-04 DIAGNOSIS — N5319 Other ejaculatory dysfunction: Secondary | ICD-10-CM

## 2017-05-04 DIAGNOSIS — Z7901 Long term (current) use of anticoagulants: Secondary | ICD-10-CM | POA: Diagnosis not present

## 2017-05-04 DIAGNOSIS — I8290 Acute embolism and thrombosis of unspecified vein: Secondary | ICD-10-CM

## 2017-05-14 DIAGNOSIS — I8290 Acute embolism and thrombosis of unspecified vein: Secondary | ICD-10-CM

## 2017-05-14 HISTORY — DX: Acute embolism and thrombosis of unspecified vein: I82.90

## 2017-05-14 NOTE — Assessment & Plan Note (Signed)
37 y.o. 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  Patient is currently receiving rivaroxaban and appears to be tolerating it well.  Thrombophilia panel obtained on initial presentation demonstrates no evidence of congenital or acquired thrombophilia.  Our options here include indefinite anticoagulation based on severity of initial thrombotic presentation versus discontinue therapeutic anticoagulation after 6 months of therapy if symptoms resolve completely.  The second option is more desired by the patient due to his preferred active lifestyle which is limited by staying on anticoagulant.  Plan: -- Consult urology for ejaculation difficulties --Repeat lower extremity Doppler ultrasound prior to return in my clinic --Return to my clinic in 3 months prior to discontinuing anticoagulation for clinical assessment.

## 2017-05-14 NOTE — Progress Notes (Signed)
[facility] New Visit:  Assessment: VTE (venous thromboembolism) 37 y.o. 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  Patient is currently receiving rivaroxaban and appears to be tolerating it well.  Thrombophilia panel obtained on initial presentation demonstrates no evidence of congenital or acquired thrombophilia.  Our options here include indefinite anticoagulation based on severity of initial thrombotic presentation versus discontinue therapeutic anticoagulation after 6 months of therapy if symptoms resolve completely.  The second option is more desired by the patient due to his preferred active lifestyle which is limited by staying on anticoagulant.  Plan: -- Consult urology for ejaculation difficulties --Repeat lower extremity Doppler ultrasound prior to return in my clinic --Return to my clinic in 3 months prior to discontinuing anticoagulation for clinical assessment.  Voice recognition software was used and creation of this note. Despite my best effort at editing the text, some misspelling/errors may have occurred.  Orders Placed This Encounter  Procedures  . CBC with Differential (Cancer Center Only)    Standing Status:   Future    Standing Expiration Date:   05/04/2018  . CMP (Cancer Center only)    Standing Status:   Future    Standing Expiration Date:   05/04/2018  . Ambulatory referral to Urology    Referral Priority:   Routine    Referral Type:   Consultation    Referral Reason:   Specialty Services Required    Requested Specialty:   Urology    Number of Visits Requested:   1    All questions were answered.  . The patient knows to call the clinic with any problems, questions or concerns.  This note was electronically signed.    History of Presenting Illness Justin Schmidt 37 y.o. presenting to the  Cancer Center for "saddle pulmonary embolism", referred by Dr Okey Regalarol L Vi please see hematological history belowncent.  Regarding details of the sole event of thrombosis that patient has experienced so far.  His past medical history significant for morbid obesity, glucose intolerance, hyperlipidemia, cholelithiasis.  Patient has had appendectomy, cholecystectomy, and tonsillectomy in the past.  He is a non-smoker and drinks maybe 1 alcoholic beverage per week.  His past family history is significant for mother who has developed a pulmonary embolism the same week as the patient, maternal uncle with history of Hodgkin's lymphoma, colon cancer, "chest cancer "and history of DVT.  Maternal grand aunt with history of VT E, and her son with history of the same.  At this time, patient is taking rivaroxaban as prescribed.  Denies bleeding complications, other than easy bruisability.  Reports fatigue and swelling in the legs.  Most distressing symptom for the patient is his inability to continue doing power lifting due to respiratory limitations and recommendations of his medical providers.  Patient denies any headaches, vision changes, focal extremity weakness.  In addition, patient discloses that he has been having issues with ejaculation.  He denies any problems with erection per se, but he has been unable to ejaculate which is distressing him and preventing him from engaging in satisfactory sexual intercourse.   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  Patient denies any previous injury to the lower extremities, protracted immobility periods, or surgeries.  No previous episodes of similar nature. --CTA chest, 01/28/17: At least submassive pulmonary  embolism. Acute pulmonary embolism and distal right main artery with extension to multiple right upper, middle, and lower lobe arteries.   Additional subsegmental left upper and left lower lobe emboli.  Evidence of right ventricular strain based on RV/LVR ratio of 0.9.   --Bilateral lower extremity Doppler, 01/29/17: no evidence of deep vein thrombosis on either side --ECHO, 01/29/17: Normal systolic function and size of the right ventricle.  No luminal diameter of inferior vena cava. -- thrombophilia panel, 01/29/17: Negative for anti-thrombin, protein C, protein S deficiency, negative for factor V Leiden and prothrombin gene mutations, negative for anticardiolipin and beta-2 glycoprotein antibodies.  DRV VT 51.1 likely attributable to heparin administration.    Treatment:  --Heparin gtt, 10/03-08/18  --Rivaroxaban, 02/02/17-  Medical History: Past Medical History:  Diagnosis Date  . Back pain   . DVT (deep venous thrombosis) (HCC)   . Gallstones   . Hyperlipidemia   . Pre-diabetes   . Pulmonary emboli [facility])     Surgical History: Past Surgical History:  Procedure Laterality Date  . APPENDECTOMY  1991  . CHOLECYSTECTOMY  01/23/2012   Procedure: LAPAROSCOPIC CHOLECYSTECTOMY;  Surgeon: Fabio Bering, MD;  Location: [facility];  Service: General;  Laterality: N/A;  . TONSILLECTOMY      Family History: Family History  Problem Relation Age of Onset  . Diabetes Mother   . Diabetes Father   . Hypertension Father   . Hyperlipidemia Father   . Migraines Sister   . Diabetes Maternal Uncle   . Diabetes Maternal Grandmother   . Diabetes Other     Social History: Social History   Socioeconomic History  . Marital status: Single    Spouse name: Not on file  . Number of children: Not on file  . Years of education: Not on file  . Highest education level: Not on file  Social Needs  . Financial resource strain: Not on file  . Food insecurity - worry: Not on file  . Food insecurity - inability: Not on file  . Transportation needs - medical: Not on file  . Transportation needs - non-medical: Not on file  Occupational  History  . Not on file  Tobacco Use  . Smoking status: Never Smoker  . Smokeless tobacco: Never Used  Substance and Sexual Activity  . Alcohol use: Yes    Alcohol/week: 0.6 oz    Types: 1 Cans of beer per week    Comment: very rare  . Drug use: No  . Sexual activity: No  Other Topics Concern  . Not on file  Social History Narrative  . Not on file    Allergies: No Known Allergies  Medications:  Current Outpatient Medications  Medication Sig Dispense Refill  . acetaminophen-codeine (TYLENOL #3) 300-30 MG tablet Take 1 tablet by mouth at bedtime as needed for moderate pain. 30 tablet 2  . Ascorbic Acid (VITAMIN C) 1000 MG tablet Take 1,000 mg by mouth daily.    . Cholecalciferol (VITAMIN D) 2000 UNITS tablet Take 2,000 Units by mouth daily.    Marland Kitchen escitalopram (LEXAPRO) 10 MG tablet Take 1 tablet (10 mg total) by mouth daily. 30 tablet 5  . fluticasone (FLONASE) 50 MCG/ACT nasal spray Place 2 sprays into both nostrils daily. 16 g 2  . Misc Natural Products (GREEN TEA) TABS Take 315 mg by mouth daily.    . Multiple Vitamin (MULTIVITAMIN WITH MINERALS) TABS Take 1 tablet by mouth daily.    . rivaroxaban (XARELTO) 20 MG TABS tablet Take  1 tablet (20 mg total) by mouth daily with supper. 30 tablet 3  . benzonatate (TESSALON) 200 MG capsule Take 1 capsule (200 mg total) by mouth 3 (three) times daily as needed. (Patient not taking: Reported on 05/04/2017) 30 capsule 0  . buPROPion (WELLBUTRIN XL) 150 MG 24 hr tablet Take 1 tablet (150 mg total) by mouth daily. (Patient not taking: Reported on 05/04/2017) 30 tablet 5  . levofloxacin (LEVAQUIN) 500 MG tablet Take 1 tablet (500 mg total) by mouth daily. (Patient not taking: Reported on 05/04/2017) 7 tablet 0   No current facility-administered medications for this visit.    Facility-Administered Medications Ordered in Other Visits  Medication Dose Route Frequency Provider Last Rate Last Dose  . sodium chloride irrigation 0.9 %    PRN Tilford Pillar, MD   3,000 mL at 01/27/12 4098    Review of Systems: Review of Systems  Constitutional: Positive for diaphoresis and fatigue.  Cardiovascular: Positive for chest pain and leg swelling.  Gastrointestinal: Positive for diarrhea.  Musculoskeletal: Positive for back pain.  Neurological: Positive for numbness.  Hematological: Bruises/bleeds easily.  All other systems reviewed and are negative.    PHYSICAL EXAMINATION Blood pressure 118/75, pulse 85, temperature 98.1 F (36.7 C), temperature source Oral, resp. rate 20, height 5\' 9"  (1.753 m), weight (!) 389 lb 1.6 oz (176.5 kg), SpO2 97 %.  ECOG PERFORMANCE STATUS: 2 - Symptomatic, <50% confined to bed  Physical Exam  Constitutional: He is oriented to person, place, and time and well-developed, well-nourished, and in no distress. No distress.  HENT:  Head: Normocephalic and atraumatic.  Mouth/Throat: Oropharynx is clear and moist. No oropharyngeal exudate.  Eyes: Conjunctivae and EOM are normal. Pupils are equal, round, and reactive to light.  Neck: No thyromegaly present.  Cardiovascular: Normal rate, regular rhythm and normal heart sounds.  No murmur heard. Pulmonary/Chest: Effort normal and breath sounds normal. No respiratory distress. He has no wheezes. He has no rales.  Abdominal: Soft. Bowel sounds are normal. He exhibits no distension. There is no tenderness. There is no rebound and no guarding.  Musculoskeletal: He exhibits no edema.  Lymphadenopathy:    He has no cervical adenopathy.  Neurological: He is alert and oriented to person, place, and time. He has normal reflexes. No cranial nerve deficit.  Skin: Skin is warm and dry. No rash noted. He is not diaphoretic. No erythema.     LABORATORY DATA: I have personally reviewed the data as listed: No visits with results within 1 Week(s) from this visit.  Latest known visit with results is:  Office Visit on 03/17/2017  Component Date Value Ref Range Status  .  Influenza A 03/17/2017 Negative  Negative Final  . Influenza B 03/17/2017 Negative  Negative Final   Comment: If the test is negative for the presence of influenza A or influenza B antigen, infection due to influenza cannot be ruled-out because the antigen present in the sample may be below the detection limit of the test. It is recommended that these results be confirmed by viral culture or an FDA-cleared influenza A and B molecular assay.          Daisy Blossom, MD

## 2017-06-11 ENCOUNTER — Telehealth: Payer: Self-pay | Admitting: Pediatrics

## 2017-06-11 NOTE — Telephone Encounter (Signed)
What is the name of the medication? Flonase  Have you contacted your pharmacy to request a refill? YES  Which pharmacy would you [place] sent to[place] [facility] [place]   Patient notified that their request is being sent to the clinical staff for review and that they should receive a call once it is complete. If they do not receive a call within 24 hours they can check with their pharmacy or our office.

## 2017-06-12 ENCOUNTER — Other Ambulatory Visit: Payer: Self-pay

## 2017-06-12 DIAGNOSIS — J329 Chronic sinusitis, unspecified: Secondary | ICD-10-CM

## 2017-06-12 MED ORDER — FLUTICASONE PROPIONATE 50 MCG/ACT NA SUSP: 2.0000 | Freq: Every day | NASAL | 2 refills | Status: DC

## 2017-06-12 NOTE — Telephone Encounter (Signed)
done

## 2017-06-28 ENCOUNTER — Other Ambulatory Visit: Payer: Self-pay | Admitting: Pediatrics

## 2017-06-28 DIAGNOSIS — I2692 Saddle embolus of pulmonary artery without acute cor pulmonale: Secondary | ICD-10-CM

## 2017-07-08 ENCOUNTER — Ambulatory Visit (HOSPITAL_COMMUNITY)
Admission: RE | Admit: 2017-07-08 | Discharge: 2017-07-08 | Disposition: A | Discharge status: Home / Self Care | Payer: Medicare Other | Source: Ambulatory Visit | Attending: Hematology and Oncology | Admitting: Hematology and Oncology

## 2017-07-08 NOTE — Progress Notes (Addendum)
Elmarie ShileyMr.Stillman C Diliberto MRN # [id number] was scheduled for a [facility] today at 11:00 AM. He did not arrive nor did he call to cancel and will need to [facility] if [facility]. Thank -you [employer] Slaugher, RVS 07/08/2017 2:41 PM

## 2017-08-07 ENCOUNTER — Ambulatory Visit (INDEPENDENT_AMBULATORY_CARE_PROVIDER_SITE_OTHER): Payer: Medicare Other | Admitting: Family Medicine

## 2017-08-07 ENCOUNTER — Telehealth: Payer: Self-pay | Admitting: Pediatrics

## 2017-08-07 ENCOUNTER — Encounter: Payer: Self-pay | Admitting: Family Medicine

## 2017-08-07 VITALS — BP 138/86 | HR 87 | Temp 98.0°F | Ht 69.0 in | Wt >= 6400 oz

## 2017-08-07 DIAGNOSIS — Z6841 Body Mass Index (BMI) 40.0 and over, adult: Secondary | ICD-10-CM

## 2017-08-07 DIAGNOSIS — I890 Lymphedema, not elsewhere classified: Secondary | ICD-10-CM | POA: Diagnosis not present

## 2017-08-07 DIAGNOSIS — L03115 Cellulitis of right lower limb: Secondary | ICD-10-CM | POA: Diagnosis not present

## 2017-08-07 MED ORDER — SULFAMETHOXAZOLE-TRIMETHOPRIM 800-160 MG PO TABS: 1.0000 | ORAL_TABLET | Freq: Two times a day (BID) | ORAL | 0 refills | Status: DC

## 2017-08-07 NOTE — Addendum Note (Signed)
Addended by: Arville CareETTINGER, JOSHUA on: 08/07/2017 04:03 PM   Modules accepted: Orders

## 2017-08-07 NOTE — Progress Notes (Signed)
BP 138/86   Pulse 87   Temp 98 F (36.7 C) (Oral)   Ht 5\' 9"  (1.753 m)   Wt (!) 414 lb (187.8 kg)   BMI 61.14 kg/m    Subjective:    Patient ID: Justin Schmidt, male    DOB: 19-Apr-1981, 37 y.o.   MRN: [id number]  HPI: Justin Schmidt is a 37 y.o. male presenting on 08/07/2017 for Edema bilateral legs, draining   HPI Bilateral lower extremity swelling and draining Patient comes in complaining of bilateral lower extremity edema and swelling that has worsened recently over the past few weeks and has been getting drainage over the past week.  He has been using triple antibiotic ointment on it to help but it has been getting very red and purulent in nature on his right anterior leg.  He has an area about 4 x 4 cm as it is draining and painful and red.  It has improved slightly with the triple antibiotic ointment that he was using.  He denies any fevers or chills.  He has had troubles with both of his legs since he had blood clots in his legs and in his lungs but he recently had an ultrasound of his legs that cleared him of any clots down there.  Relevant past medical, surgical, family and social history reviewed and updated as indicated. Interim medical history since our last visit reviewed. Allergies and medications reviewed and updated.  Review of Systems  Constitutional: Negative for chills and fever.  Respiratory: Negative for shortness of breath and wheezing.   Cardiovascular: Positive for leg swelling. Negative for chest pain.  Musculoskeletal: Negative for back pain and gait problem.  Skin: Positive for color change and wound. Negative for rash.  All other systems reviewed and are negative.   Per HPI unless specifically indicated above   Allergies as of 08/07/2017   No Known Allergies     Medication List        Accurate as of 08/07/17  3:32 PM. Always use your most recent med list.          acetaminophen-codeine 300-30 MG tablet Commonly known as:  TYLENOL #3 Take 1  tablet by mouth at bedtime as needed for moderate pain.   escitalopram 10 MG tablet Commonly known as:  LEXAPRO Take 1 tablet (10 mg total) by mouth daily.   fluticasone 50 MCG/ACT nasal spray Commonly known as:  FLONASE Place 2 sprays into both nostrils daily.   Green Tea Tabs Take 315 mg by mouth daily.   multivitamin with minerals Tabs tablet Take 1 tablet by mouth daily.   sulfamethoxazole-trimethoprim 800-160 MG tablet Commonly known as:  BACTRIM DS,SEPTRA DS Take 1 tablet by mouth 2 (two) times daily.   vitamin C 1000 MG tablet Take 1,000 mg by mouth daily.   Vitamin D 2000 units tablet Take 2,000 Units by mouth daily.   XARELTO 20 MG Tabs tablet Generic drug:  rivaroxaban TAKE 1 TABLET BY MOUTH ONCE DAILY WITH SUPPER          Objective:    BP 138/86   Pulse 87   Temp 98 F (36.7 C) (Oral)   Ht 5\' 9"  (1.753 m)   Wt (!) 414 lb (187.8 kg)   BMI 61.14 kg/m   Wt Readings from Last 3 Encounters:  08/07/17 (!) 414 lb (187.8 kg)  05/04/17 (!) 389 lb 1.6 oz (176.5 kg)  03/30/17 (!) 386 lb (175.1 kg)    Physical Exam  Constitutional: He is oriented to person, place, and time. He appears well-developed and well-nourished. No distress.  Eyes: Pupils are equal, round, and reactive to light. Conjunctivae and EOM are normal. Right eye exhibits no discharge. No scleral icterus.  Neck: Neck supple. No thyromegaly present.  Musculoskeletal: Normal range of motion. He exhibits edema (Bilateral lower extremity 2+ edema with weeping from both shins, small area of erythema and warmth about 4 x 4 cm on right shin).  Lymphadenopathy:    He has no cervical adenopathy.  Neurological: He is alert and oriented to person, place, and time. Coordination normal.  Skin: Skin is warm and dry. No rash noted. He is not diaphoretic.  Psychiatric: He has a normal mood and affect. His behavior is normal.  Nursing note and vitals reviewed.       Assessment & Plan:   Problem List  Items Addressed This Visit    None    Visit Diagnoses    Lymphedema    -  Primary   Relevant Orders   Ambulatory referral to Physical Therapy   Cellulitis of right lower extremity       Right anterior leg with swelling and weeping   Relevant Medications   sulfamethoxazole-trimethoprim (BACTRIM DS,SEPTRA DS) 800-160 MG tablet       Follow up plan: Return if symptoms worsen or fail to improve.  Counseling provided for all of the vaccine components Orders Placed This Encounter  Procedures  . Ambulatory referral to Physical Therapy    Arville Care, MD [facility] 08/07/2017, 3:32 PM

## 2017-08-12 ENCOUNTER — Encounter: Payer: Self-pay | Admitting: Hematology and Oncology

## 2017-08-12 ENCOUNTER — Telehealth: Payer: Self-pay | Admitting: Hematology and Oncology

## 2017-08-12 ENCOUNTER — Inpatient Hospital Stay: Payer: Medicare Other

## 2017-08-12 ENCOUNTER — Inpatient Hospital Stay: Payer: Medicare Other | Attending: Hematology and Oncology | Admitting: Hematology and Oncology

## 2017-08-12 VITALS — BP 134/71 | HR 94 | Temp 97.8°F | Resp 17 | Ht 69.0 in | Wt >= 6400 oz

## 2017-08-12 DIAGNOSIS — Z86711 Personal history of pulmonary embolism: Secondary | ICD-10-CM | POA: Diagnosis not present

## 2017-08-12 DIAGNOSIS — Z86718 Personal history of other venous thrombosis and embolism: Secondary | ICD-10-CM | POA: Diagnosis not present

## 2017-08-12 DIAGNOSIS — M7989 Other specified soft tissue disorders: Secondary | ICD-10-CM | POA: Diagnosis not present

## 2017-08-12 DIAGNOSIS — Z79899 Other long term (current) drug therapy: Secondary | ICD-10-CM | POA: Diagnosis not present

## 2017-08-12 DIAGNOSIS — Z800 Family history of malignant neoplasm of digestive organs: Secondary | ICD-10-CM | POA: Diagnosis not present

## 2017-08-12 DIAGNOSIS — Z7901 Long term (current) use of anticoagulants: Secondary | ICD-10-CM | POA: Diagnosis not present

## 2017-08-12 DIAGNOSIS — Z8249 Family history of ischemic heart disease and other diseases of the circulatory system: Secondary | ICD-10-CM | POA: Insufficient documentation

## 2017-08-12 DIAGNOSIS — I8290 Acute embolism and thrombosis of unspecified vein: Secondary | ICD-10-CM

## 2017-08-12 LAB — CMP (CANCER CENTER ONLY)
ALT: 62 U/L — AB (ref 0–55)
AST: 36 U/L — ABNORMAL HIGH (ref 5–34)
Albumin: 3.7 g/dL (ref 3.5–5.0)
Alkaline Phosphatase: 62 U/L (ref 40–150)
Anion gap: 6 (ref 3–11)
BILIRUBIN TOTAL: 0.4 mg/dL (ref 0.2–1.2)
BUN: 14 mg/dL (ref 7–26)
CHLORIDE: 107 mmol/L (ref 98–109)
CO2: 29 mmol/L (ref 22–29)
CREATININE: 1.33 mg/dL — AB (ref 0.70–1.30)
Calcium: 9.7 mg/dL (ref 8.4–10.4)
GFR, Est AFR Am: >60 mL/min (ref >60)
GLUCOSE: 104 mg/dL (ref 70–140)
Potassium: 4.1 mmol/L (ref 3.5–5.1)
Sodium: 142 mmol/L (ref 136–145)
TOTAL PROTEIN: 7.1 g/dL (ref 6.4–8.3)

## 2017-08-12 LAB — CBC WITH DIFFERENTIAL (CANCER CENTER ONLY)
Basophils Absolute: 0 10*3/uL (ref 0.0–0.1)
Basophils Relative: 1 %
EOS ABS: 0.1 10*3/uL (ref 0.0–0.5)
EOS PCT: 2 %
HCT: 40 % (ref 38.4–49.9)
Hemoglobin: 13.4 g/dL (ref 13.0–17.1)
Lymphocytes Relative: 18 %
Lymphs Abs: 0.9 10*3/uL (ref 0.9–3.3)
MCH: 31.7 pg (ref 27.2–33.4)
MCHC: 33.5 g/dL (ref 32.0–36.0)
MCV: 94.8 fL (ref 79.3–98.0)
MONO ABS: 0.5 10*3/uL (ref 0.1–0.9)
Monocytes Relative: 10 %
Neutro Abs: 3.8 10*3/uL (ref 1.5–6.5)
Neutrophils Relative %: 69 %
PLATELETS: 246 10*3/uL (ref 140–400)
RBC: 4.23 MIL/uL (ref 4.20–5.82)
RDW: 14.4 % (ref 11.0–14.6)
WBC: 5.4 10*3/uL (ref 4.0–10.3)

## 2017-08-12 NOTE — Telephone Encounter (Signed)
Return if symptoms worsen or fail to improve. Per 4/17 los

## 2017-08-19 DIAGNOSIS — I890 Lymphedema, not elsewhere classified: Secondary | ICD-10-CM | POA: Diagnosis not present

## 2017-08-20 ENCOUNTER — Other Ambulatory Visit: Payer: Self-pay | Admitting: Pediatrics

## 2017-08-20 DIAGNOSIS — M549 Dorsalgia, unspecified: Principal | ICD-10-CM

## 2017-08-20 DIAGNOSIS — G8929 Other chronic pain: Secondary | ICD-10-CM

## 2017-08-21 ENCOUNTER — Telehealth: Payer: Self-pay | Admitting: Pediatrics

## 2017-08-21 NOTE — Telephone Encounter (Signed)
Sooner appt made 

## 2017-08-24 ENCOUNTER — Encounter: Payer: Self-pay | Admitting: Pediatrics

## 2017-08-24 ENCOUNTER — Ambulatory Visit (INDEPENDENT_AMBULATORY_CARE_PROVIDER_SITE_OTHER): Payer: Medicare Other | Admitting: Pediatrics

## 2017-08-24 VITALS — BP 134/84 | HR 78 | Temp 97.0°F | Ht 69.0 in | Wt >= 6400 oz

## 2017-08-24 DIAGNOSIS — R609 Edema, unspecified: Secondary | ICD-10-CM | POA: Diagnosis not present

## 2017-08-24 DIAGNOSIS — Z86718 Personal history of other venous thrombosis and embolism: Secondary | ICD-10-CM

## 2017-08-24 DIAGNOSIS — G8929 Other chronic pain: Secondary | ICD-10-CM | POA: Diagnosis not present

## 2017-08-24 DIAGNOSIS — R0683 Snoring: Secondary | ICD-10-CM

## 2017-08-24 DIAGNOSIS — M545 Low back pain, unspecified: Secondary | ICD-10-CM

## 2017-08-24 DIAGNOSIS — Z6841 Body Mass Index (BMI) 40.0 and over, adult: Secondary | ICD-10-CM | POA: Diagnosis not present

## 2017-08-24 DIAGNOSIS — M62838 Other muscle spasm: Secondary | ICD-10-CM

## 2017-08-24 DIAGNOSIS — F32A Depression, unspecified: Secondary | ICD-10-CM

## 2017-08-24 DIAGNOSIS — F329 Major depressive disorder, single episode, unspecified: Secondary | ICD-10-CM

## 2017-08-24 MED ORDER — HYDROCODONE-ACETAMINOPHEN 5-325 MG PO TABS: 1.0000 | ORAL_TABLET | Freq: Every day | ORAL | 0 refills | Status: AC | PRN

## 2017-08-24 MED ORDER — HYDROCODONE-ACETAMINOPHEN 5-325 MG PO TABS: 1.0000 | ORAL_TABLET | Freq: Every day | ORAL | 0 refills | Status: DC | PRN

## 2017-08-24 MED ORDER — RIVAROXABAN 20 MG PO TABS: ORAL_TABLET | ORAL | 2 refills | Status: DC

## 2017-08-24 MED ORDER — CYCLOBENZAPRINE HCL 5 MG PO TABS: 5.0000 mg | ORAL_TABLET | Freq: Two times a day (BID) | ORAL | 0 refills | Status: DC | PRN

## 2017-08-24 MED ORDER — FUROSEMIDE 20 MG PO TABS: 20.0000 mg | ORAL_TABLET | Freq: Every day | ORAL | 3 refills | Status: DC

## 2017-08-24 MED ORDER — ESCITALOPRAM OXALATE 10 MG PO TABS: 10.0000 mg | ORAL_TABLET | Freq: Every day | ORAL | 5 refills | Status: DC

## 2017-08-24 NOTE — Progress Notes (Signed)
Subjective:   Patient ID: Justin Schmidt, male    DOB: 02-Jan-1981, 37 y.o.   MRN: [id number] CC: Follow-up multiple medical problems HPI: Justin Schmidt is a 37 y.o. male   Depression: Mood is been down.  Stopped Lexapro several months ago, thought he could not take with the Xarelto.  No thoughts of self-harm.  Sleeping slightly better.  Snoring: Ongoing.  No prior sleep study.  Does not feel well rested during the day.  Elevated BMI: Has had difficulty exercising since recent DVT.  Cellulitis: Treated 2 weeks ago with Bactrim for cellulitis of his right lower leg.  He has swelling of his lower extremity's bilaterally.  Compression hose to help.  He is starting physical therapy soon for lymphedema.  He is not taking the Lasix in several months because he was concern of interaction with the Xarelto.  No pain with walking in the leg.  He thinks he feels much better since the antibiotic treatment.  Indication for chronic opioid: Arthritis Medication and dose: Codeine with Tylenol # pills per month: 30 Last UDS date: 08/2016 Opioid Treatment Agreement signed (Y/N): Y NCCSRS reviewed this encounter (include red flags):   Y   Relevant past medical, surgical, family and social history reviewed. Allergies and medications reviewed and updated. Social History   Tobacco Use  Smoking Status Never Smoker  Smokeless Tobacco Never Used   ROS: Per HPI   Objective:    BP 134/84   Pulse 78   Temp (!) 97 F (36.1 C) (Oral)   Ht  (1.753 m)   Wt (!) 415 lb (188.2 kg)   BMI 61.28 kg/m   Wt Readings from Last 3 Encounters:  08/24/17 (!) 415 lb (188.2 kg)  08/12/17 (!) 411 lb 9.6 oz (186.7 kg)  08/07/17 (!) 414 lb (187.8 kg)    Gen: NAD, alert, cooperative with exam, NCAT EYES: EOMI, no conjunctival injection, or no icterus ENT:  OP without erythema LYMPH: no cervical LAD CV: NRRR, normal S1/S2 Resp: CTABL, no wheezes, normal WOB Abd: +BS, soft, NTND. no guarding or organomegaly Ext:  b/l nonpitting edema, right lower leg small amount of clear fluid from excoriation Neuro: Alert and oriented Skin: Right lower leg slightly pink, several scabbed over excoriations, appear well-healing.  No tenderness.  No induration.  Assessment & Plan:  37 year old male here for follow-up multiple medical problems  Diagnoses and all orders for this visit:  Swelling Restart below. -     furosemide (LASIX) 20 MG tablet; Take 1 tablet (20 mg total) by mouth daily.  Depression, unspecified depression type Ongoing symptoms, mood is down.  Will refer to virtual behavioral health. -     escitalopram (LEXAPRO) 10 MG tablet; Take 1 tablet (10 mg total) by mouth daily.  History of PE 6 months ago, currently on Xarelto.  Recent appointment with oncology/hematology, patient is not sure if he supposed to continue Xarelto or not.  Continues to have swelling in his lower extremities likely due to lymphedema and venous stasis.  Has not had repeat Dopplers done that look like they were ordered by hematology. -     rivaroxaban (XARELTO) 20 MG TABS tablet; TAKE 1 TABLET BY MOUTH ONCE DAILY WITH SUPPER  BMI 60.0-69.9, adult (HCC) Continue lifestyle changes, will refer to nutrition.  Continue walking every day.  Must get weight under better control to improve his swelling in his legs.  -     Amb ref to Medical Nutrition Therapy-MNT  Snoring Loud  snoring, difficulty losing weight, daytime fatigue. -     Ambulatory referral to Neurology  Chronic midline low back pain without sciatica Previously treated with codeine with Tylenol.  Creatinine recently up to 1.3.  Will switch to hydrocodone 5 mg, take while awake for severe pain.  Do not take before going to sleep at night. -     HYDROcodone-acetaminophen (NORCO/VICODIN) 5-325 MG tablet; Take 1 tablet by mouth daily as needed for moderate pain. -     HYDROcodone-acetaminophen (NORCO/VICODIN) 5-325 MG tablet; Take 1 tablet by mouth daily as needed for moderate  pain.  Muscle spasm Okay to take below, not with hydrocodone, for muscle spasm. -     cyclobenzaprine (FLEXERIL) 5 MG tablet; Take 1 tablet (5 mg total) by mouth 2 (two) times daily as needed for muscle spasms.   Follow up plan: Return in about 2 months (around 10/24/2017). Rex Kras, MD [facility]

## 2017-08-25 ENCOUNTER — Telehealth: Payer: Self-pay

## 2017-08-25 DIAGNOSIS — I890 Lymphedema, not elsewhere classified: Secondary | ICD-10-CM | POA: Diagnosis not present

## 2017-08-25 NOTE — Telephone Encounter (Signed)
[facility] - Unable to leave a message because the voice mail is not set up.  Writer will continue to try and make contact with the patient.

## 2017-08-27 DIAGNOSIS — I890 Lymphedema, not elsewhere classified: Secondary | ICD-10-CM | POA: Diagnosis not present

## 2017-08-30 NOTE — Progress Notes (Signed)
[name] Follow-up Visit:  Assessment: VTE (venous thromboembolism) 37 y.o. 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  Patient is currently receiving rivaroxaban and appears to be tolerating it well.  Thrombophilia panel obtained on initial presentation demonstrates no evidence of congenital or acquired thrombophilia.  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  In this setting, discontinuation of anticoagulation is ill advised as patient has high risk of recurrent deep vein thrombosis.  Plan: - Recommend indefinite anticoagulation.  Rivaroxaban appears to be well tolerated without breakthrough clotting or pathological bleeding. -Return to our clinic as needed.   Voice recognition software was used and creation of this note. Despite my best effort at editing the text, some misspelling/errors may have occurred.  No orders of the defined types were placed in this encounter.   Cancer Staging No matching staging information was found for the patient.  All questions were answered.  . The patient knows to call the clinic with any problems, questions or concerns.  This note was electronically signed.    History of Presenting Illness Justin Schmidt is a 37 y.o. male followed in the [facility] for "saddle pulmonary embolism", referred by Dr Arbie Cookey L Vi please see hematological history belowncent.  Regarding details of the sole event of thrombosis that patient has experienced so far.  His past medical history significant for morbid obesity, glucose intolerance, hyperlipidemia, cholelithiasis.  Patient has had appendectomy, cholecystectomy, and tonsillectomy in the past.  He is a non-smoker and drinks maybe 1  alcoholic beverage per week.  His past family history is significant for mother who has developed a pulmonary embolism the same week as the patient, maternal uncle with history of Hodgkin's lymphoma, colon cancer, "chest cancer "and history of DVT.  Maternal grand aunt with history of VT E, and her son with history of the same.  At this time, patient is taking rivaroxaban as prescribed.  Denies bleeding complications, other than easy bruisability.  Reports fatigue, SOB, and swelling in the legs.   Patient does notice correlation with the mentioned symptoms and his salt intake, as he has recently been eating more fast food.  Denies any chest pain, palpitations or syncopal events.  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  Patient denies any previous injury to the lower extremities, protracted immobility periods, or surgeries.  No previous episodes of similar nature. --CTA chest, 01/28/17: At least submassive pulmonary embolism. Acute pulmonary embolism and distal right main artery with extension to multiple right upper, middle, and lower lobe arteries.  Additional subsegmental left upper and left lower lobe emboli.  Evidence of right ventricular strain based on RV/LVR ratio of 0.9.   --Bilateral lower extremity Doppler, 01/29/17: no evidence of deep vein thrombosis on either side --ECHO, 68/12/75: Normal systolic function and size of the right ventricle.  No luminal diameter of inferior vena cava. -- thrombophilia panel, 01/29/17: Negative for anti-thrombin, protein C, protein S deficiency, negative for factor V Leiden and prothrombin gene mutations, negative for anticardiolipin and beta-2 glycoprotein antibodies.  DRV VT 51.1 likely attributable to heparin administration.    Treatment:             --Heparin gtt, 10/03-08/18             --  Rivaroxaban, 02/02/17-    No  history exists.    Medical History: Past Medical History:  Diagnosis Date  . Back pain   . DVT (deep venous thrombosis) ([place])   . Gallstones   . Hyperlipidemia   . Pre-diabetes   . Pulmonary emboli [facility])     Surgical History: Past Surgical History:  Procedure Laterality Date  . APPENDECTOMY  1991  . CHOLECYSTECTOMY  01/23/2012   Procedure: LAPAROSCOPIC CHOLECYSTECTOMY;  Surgeon: Donato Heinz, MD;  Location: [facility];  Service: General;  Laterality: N/A;  . TONSILLECTOMY      Family History: Family History  Problem Relation Age of Onset  . Diabetes Mother   . Diabetes Father   . Hypertension Father   . Hyperlipidemia Father   . Migraines Sister   . Diabetes Maternal Uncle   . Diabetes Maternal Grandmother   . Diabetes Other     Social History: Social History   Socioeconomic History  . Marital status: Single    Spouse name: Not on file  . Number of children: Not on file  . Years of education: Not on file  . Highest education level: Not on file  Occupational History  . Not on file  Social Needs  . Financial resource strain: Not on file  . Food insecurity:    Worry: Not on file    Inability: Not on file  . Transportation needs:    Medical: Not on file    Non-medical: Not on file  Tobacco Use  . Smoking status: Never Smoker  . Smokeless tobacco: Never Used  Substance and Sexual Activity  . Alcohol use: Yes    Alcohol/week: 0.6 oz    Types: 1 Cans of beer per week    Comment: very rare  . Drug use: No  . Sexual activity: Never  Lifestyle  . Physical activity:    Days per week: Not on file    Minutes per session: Not on file  . Stress: Not on file  Relationships  . Social connections:    Talks on phone: Not on file    Gets together: Not on file    Attends religious service: Not on file    Active member of club or organization: Not on file    Attends meetings of clubs or organizations: Not on file    Relationship status: Not on file  . Intimate  partner violence:    Fear of current or ex partner: Not on file    Emotionally abused: Not on file    Physically abused: Not on file    Forced sexual activity: Not on file  Other Topics Concern  . Not on file  Social History Narrative  . Not on file    Allergies: No Known Allergies  Medications:  Current Outpatient Medications  Medication Sig Dispense Refill  . acetaminophen-codeine (TYLENOL #3) 300-30 MG tablet Take 1 tablet by mouth at bedtime as needed for moderate pain. 30 tablet 2  . Ascorbic Acid (VITAMIN C) 1000 MG tablet Take 1,000 mg by mouth daily.    . Cholecalciferol (VITAMIN D) 2000 UNITS tablet Take 2,000 Units by mouth daily.    . cyclobenzaprine (FLEXERIL) 5 MG tablet Take 1 tablet (5 mg total) by mouth 2 (two) times daily as needed for muscle spasms. 10 tablet 0  . escitalopram (LEXAPRO) 10 MG tablet Take 1 tablet (10 mg total) by mouth daily. 30 tablet 5  . fluticasone (FLONASE) 50 MCG/ACT nasal spray Place 2 sprays  into both nostrils daily. 16 g 2  . furosemide (LASIX) 20 MG tablet Take 1 tablet (20 mg total) by mouth daily. 30 tablet 3  . [START ON 09/23/2017] HYDROcodone-acetaminophen (NORCO/VICODIN) 5-325 MG tablet Take 1 tablet by mouth daily as needed for moderate pain. 30 tablet 0  . HYDROcodone-acetaminophen (NORCO/VICODIN) 5-325 MG tablet Take 1 tablet by mouth daily as needed for moderate pain. 30 tablet 0  . Misc Natural Products (GREEN TEA) TABS Take 315 mg by mouth daily.    . Multiple Vitamin (MULTIVITAMIN WITH MINERALS) TABS Take 1 tablet by mouth daily.    . rivaroxaban (XARELTO) 20 MG TABS tablet TAKE 1 TABLET BY MOUTH ONCE DAILY WITH SUPPER 30 tablet 2   No current facility-administered medications for this visit.    Facility-Administered Medications Ordered in Other Visits  Medication Dose Route Frequency Provider Last Rate Last Dose  . sodium chloride irrigation 0.9 %    PRN Chelsea Primus, MD   3,000 mL at 01/27/12 2130    Review of  Systems: Review of Systems  Constitutional: Positive for diaphoresis and fatigue.  Cardiovascular: Positive for chest pain and leg swelling.  Gastrointestinal: Positive for diarrhea.  Musculoskeletal: Positive for back pain.  Neurological: Positive for numbness.  Hematological: Bruises/bleeds easily.  All other systems reviewed and are negative.    PHYSICAL EXAMINATION Blood pressure 134/71, pulse 94, temperature 97.8 F (36.6 C), temperature source Oral, resp. rate 17, height _0  (1.753 m), weight (!) 411 lb 9.6 oz (186.7 kg), SpO2 96 %.  ECOG PERFORMANCE STATUS: 1 - Symptomatic but completely ambulatory  Physical Exam  Constitutional: He is oriented to person, place, and time. He appears well-developed and well-nourished. No distress.  HENT:  Head: Normocephalic and atraumatic.  Mouth/Throat: Oropharynx is clear and moist. No oropharyngeal exudate.  Eyes: Pupils are equal, round, and reactive to light. Conjunctivae and EOM are normal. No scleral icterus.  Neck: No thyromegaly present.  Cardiovascular: Normal rate, regular rhythm, normal heart sounds and intact distal pulses. Exam reveals no gallop and no friction rub.  No murmur heard. Pulmonary/Chest: Effort normal and breath sounds normal. No stridor. No respiratory distress. He has no wheezes. He has no rales.  Abdominal: Soft. Bowel sounds are normal. He exhibits no distension and no mass. There is no tenderness. There is no guarding.  Musculoskeletal: He exhibits edema and tenderness.  Lymphadenopathy:    He has no cervical adenopathy.  Neurological: He is alert and oriented to person, place, and time. He displays normal reflexes. No cranial nerve deficit or sensory deficit.  Skin: Skin is warm and dry. No rash noted. He is not diaphoretic. No erythema. No pallor.     LABORATORY DATA: I have personally reviewed the data as listed: Appointment on 08/12/2017  Component Date Value Ref Range Status  . Sodium 08/12/2017  142  136 - 145 mmol/L Final  . Potassium 08/12/2017 4.1  3.5 - 5.1 mmol/L Final  . Chloride 08/12/2017 107  98 - 109 mmol/L Final  . CO2 08/12/2017 29  22 - 29 mmol/L Final  . Glucose, Bld 08/12/2017 104  70 - 140 mg/dL Final  . BUN 08/12/2017 14  7 - 26 mg/dL Final  . Creatinine 08/12/2017 1.33* 0.70 - 1.30 mg/dL Final  . Calcium 08/12/2017 9.7  8.4 - 10.4 mg/dL Final  . Total Protein 08/12/2017 7.1  6.4 - 8.3 g/dL Final  . Albumin 08/12/2017 3.7  3.5 - 5.0 g/dL Final  . AST 08/12/2017 36* 5 -  34 U/L Final  . ALT 08/12/2017 62* 0 - 55 U/L Final  . Alkaline Phosphatase 08/12/2017 62  40 - 150 U/L Final  . Total Bilirubin 08/12/2017 0.4  0.2 - 1.2 mg/dL Final  . GFR, Est Non Af Am 08/12/2017 >60  >60 mL/min Final  . GFR, Est AFR Am 08/12/2017 >60  >60 mL/min Final   Comment: (NOTE) The eGFR has been calculated using the CKD EPI equation. This calculation has not been validated in all clinical situations. eGFR's persistently <60 mL/min signify possible Chronic Kidney Disease.   Georgiann Hahn gap 08/12/2017 6  3 - 11 Final   Performed at [facility], [place] [address] [place], [place] [address]  . WBC Count 08/12/2017 5.4  4.0 - 10.3 K/uL Final  . RBC 08/12/2017 4.23  4.20 - 5.82 MIL/uL Final  . Hemoglobin 08/12/2017 13.4  13.0 - 17.1 g/dL Final  . HCT 08/12/2017 40.0  38.4 - 49.9 % Final  . MCV 08/12/2017 94.8  79.3 - 98.0 fL Final  . MCH 08/12/2017 31.7  27.2 - 33.4 pg Final  . MCHC 08/12/2017 33.5  32.0 - 36.0 g/dL Final  . RDW 08/12/2017 14.4  11.0 - 14.6 % Final  . Platelet Count 08/12/2017 246  140 - 400 K/uL Final  . Neutrophils Relative % 08/12/2017 69  % Final  . Neutro Abs 08/12/2017 3.8  1.5 - 6.5 K/uL Final  . Lymphocytes Relative 08/12/2017 18  % Final  . Lymphs Abs 08/12/2017 0.9  0.9 - 3.3 K/uL Final  . Monocytes Relative 08/12/2017 10  % Final  . Monocytes Absolute 08/12/2017 0.5  0.1 - 0.9 K/uL Final  . Eosinophils Relative 08/12/2017 2  % Final   . Eosinophils Absolute 08/12/2017 0.1  0.0 - 0.5 K/uL Final  . Basophils Relative 08/12/2017 1  % Final  . Basophils Absolute 08/12/2017 0.0  0.0 - 0.1 K/uL Final   Performed at [facility], [place] [address], [place], [place] [address]       Ardath Sax, MD

## 2017-08-30 NOTE — Assessment & Plan Note (Signed)
37 y.o. 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  Patient is currently receiving rivaroxaban and appears to be tolerating it well.  Thrombophilia panel obtained on initial presentation demonstrates no evidence of congenital or acquired thrombophilia.  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  In this setting, discontinuation of anticoagulation is ill advised as patient has high risk of recurrent deep vein thrombosis.  Plan: - Recommend indefinite anticoagulation.  Rivaroxaban appears to be well tolerated without breakthrough clotting or pathological bleeding. -Return to our clinic as needed.

## 2017-09-01 DIAGNOSIS — I890 Lymphedema, not elsewhere classified: Secondary | ICD-10-CM | POA: Diagnosis not present

## 2017-09-08 ENCOUNTER — Telehealth: Payer: Self-pay | Admitting: Clinical

## 2017-09-08 DIAGNOSIS — F329 Major depressive disorder, single episode, unspecified: Secondary | ICD-10-CM

## 2017-09-08 DIAGNOSIS — F32A Depression, unspecified: Secondary | ICD-10-CM

## 2017-09-08 NOTE — BH Specialist Note (Signed)
3:09PM-3:50 PM [facility] Initial Clinical Assessment  MRN: [id number] NAME: Justin Schmidt Date: 09/08/17  Start time: 3:09pm End time: 3:50pm Total time: 41  min  Type of Contact: Type of Contact: Phone Call Initial Contact Patient consent obtained: Patient consent obtained for Virtual Visit: Yes Reason for Visit today: Reason for Your Call/Visit Today: Depression  Treatment History Patient recently received Inpatient Treatment: Have You Recently Been in Any Inpatient Treatment (Hospital/Detox/Crisis Center/28-Day Program)?: No  Facility/Program:    Date of discharge:   Patient currently being seen by therapist/psychiatrist: Do You Currently Have a Therapist/Psychiatrist?: No Patient currently receiving the following services: Patient Currently Receiving the Following Services:: Currently not receiving any services  Past Psychiatric History/Hospitalization(s): Anxiety: No Bipolar Disorder: No Depression: Yes Mania: No Psychosis: No Schizophrenia: No Personality Disorder: No Hospitalization for psychiatric illness: No History of Electroconvulsive Shock Therapy: NA Prior Suicide Attempts: No   Clinical Assessment:  PHQ-9 Assessments: Depression screen [facility] 2/9 09/08/2017 08/24/2017 03/30/2017  Decreased Interest Down, Depressed, Hopeless PHQ - 2 Score Altered sleeping Tired, decreased energy Change in appetite Feeling bad or failure about yourself  3 2 0  Trouble concentrating 0 0 1  Moving slowly or fidgety/restless Suicidal thoughts 0 3 0  PHQ-9 Score Difficult doing work/chores Very difficult - Somewhat difficult  Some recent data might be hidden    GAD-7 Assessments: GAD 7 : Generalized Anxiety Score 09/11/2016 09/04/2016 08/22/2016 01/04/2016  Nervous, Anxious, on Edge Control/stop worrying Worry too much - different things Trouble relaxing 0  Restless 0 0 0 0   Easily annoyed or irritable Afraid - awful might happen 0 0 0 0  Total GAD 7 Score Anxiety Difficulty Somewhat difficult - Somewhat difficult Somewhat difficult     Social Functioning Social maturity: Social Maturity: Responsible Social judgement: Social Judgement: Normal  Stress Current stressors: Current Stressors: Other (Comment)(Recent blood clot, gaining weight) Familial stressors: Familial Stressors: None Sleep: Sleep: No problems Appetite: Appetite: Increased, Weight gain(Stress and can't go to the gym) Coping ability: Coping ability: Resilient, Other (Comment)(Like to be with the animals, goat, chickens, horse & dogs) Patient taking medications as prescribed: Patient taking medications as prescribed: No(Taking lexapro only 1-2x/week not consistently, given education around taking it as prescribed daily)  Current medications:  Outpatient Encounter Medications as of 09/08/2017  Medication Sig  . acetaminophen-codeine (TYLENOL #3) 300-30 MG tablet Take 1 tablet by mouth at bedtime as needed for moderate pain.  . Ascorbic Acid (VITAMIN C) 1000 MG tablet Take 1,000 mg by mouth daily.  . Cholecalciferol (VITAMIN D) 2000 UNITS tablet Take 2,000 Units by mouth daily.  . cyclobenzaprine (FLEXERIL) 5 MG tablet Take 1 tablet (5 mg total) by mouth 2 (two) times daily as needed for muscle spasms.  Marland Kitchen escitalopram (LEXAPRO) 10 MG tablet Take 1 tablet (10 mg total) by mouth daily.  . fluticasone (FLONASE) 50 MCG/ACT nasal spray Place 2 sprays into both nostrils daily.  . furosemide (LASIX) 20 MG tablet Take 1 tablet (20 mg total) by mouth daily.  Melene Muller ON 09/23/2017] HYDROcodone-acetaminophen (NORCO/VICODIN) 5-325 MG tablet Take 1 tablet by mouth daily as needed for moderate  pain.  Marland Kitchen HYDROcodone-acetaminophen (NORCO/VICODIN) 5-325 MG tablet Take 1 tablet by mouth daily as needed for moderate pain.  . Misc Natural Products (GREEN TEA) TABS Take 315 mg by mouth daily.  .  Multiple Vitamin (MULTIVITAMIN WITH MINERALS) TABS Take 1 tablet by mouth daily.  . rivaroxaban (XARELTO) 20 MG TABS tablet TAKE 1 TABLET BY MOUTH ONCE DAILY WITH SUPPER   Facility-Administered Encounter Medications as of 09/08/2017  Medication  . sodium chloride irrigation 0.9 %    Self-harm Behaviors Risk Assessment Self-harm risk factors:   Patient endorses recent thoughts of harming self: Have you recently had any thoughts about harming yourself?: No  Danger to Others Risk Assessment Danger to others risk factors: Danger to Others Risk Factors: No risk factors noted Patient endorses recent thoughts of harming others:  No  Substance Use Assessment Patient recently consumed alcohol: Have you recently consumed alcohol?: Yes(1-2x/month)  Alcohol Use Disorder Identification Test (AUDIT):  Alcohol Use Disorder Test (AUDIT) 09/08/2017  1. How often do you have a drink containing alcohol? 2  2. How many drinks containing alcohol do you have on a typical day when you are drinking? 0  3. How often do you have six or more drinks on one occasion? 0  AUDIT-C Score 2   Patient recently used drugs: Have you recently used any drugs?: No  Opioid Risk Assessment:  Patient is concerned about dependence or abuse of substances: Does patient seem concerned about dependence or abuse of any substance?: No  Decreased need for sleep: No  Euphoria: No  Self Injurious behaviors: No  Family History of mental illness: No  Family History of substance abuse: No  Substance Abuse: No  DUI: No  Insomnia: No   History of violence: No  Physical, sexual or emotional abuse: No  Prior outpatient mental health therapy: More than 10 years ago   Goals, Interventions and Follow-up Plan Goals: Improve medication compliance and Increase pleasant activities, eg going to the gym Interventions: Behavioral Activation, Medication Monitoring and Psychoeducation and/or Health Education   Summary of Clinical  Assessment Summary:   Jaymie Misch is a 37 yo male with depressive symptoms which has increased due to more recent medical problems including swelling, hx of VTE, and chronic back pain. He reported weight increase due to his inability to be more physical which has made him feel more depressed.  Symeon Puleo currently lives and takes care of his 76 grandfather with Alzheimer's and his mother who moved in with them about a year ago with after her throat surgery.  Diondre Pulis is currently unemployed and on disability.  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  He reported general anxiety about his future.  Petra Sargeant is currently taking  Lexapro inconsistently, he reported he takes it twice a week, last time he took it was yesterday.  He was reminded about taking the medication as prescribed by Dr. Oswaldo Done, which is daily, for it to be effective.   Follow-up Plan: Continue [facility] services & Med monitoring - Follow up in 1 week and every 2 weeks after that. Consult with PCP & Dr. Vanetta Shawl, psychiatrist.  [facility] Follow up on Wednesday afternoons as preferred by Mr. Margo Aye.  Mr Margo Aye plans to do some weights tonight and walk around [place] for his activity this week.   Kaeson Kleinert Ed Blalock, LCSW

## 2017-09-09 ENCOUNTER — Encounter (HOSPITAL_COMMUNITY): Payer: Self-pay | Admitting: Psychiatry

## 2017-09-09 ENCOUNTER — Encounter: Payer: Self-pay | Admitting: Clinical

## 2017-09-09 ENCOUNTER — Telehealth: Payer: Self-pay | Admitting: Clinical

## 2017-09-09 DIAGNOSIS — I890 Lymphedema, not elsewhere classified: Secondary | ICD-10-CM | POA: Diagnosis not present

## 2017-09-09 DIAGNOSIS — F32A Depression, unspecified: Secondary | ICD-10-CM

## 2017-09-09 DIAGNOSIS — F329 Major depressive disorder, single episode, unspecified: Secondary | ICD-10-CM

## 2017-09-09 NOTE — Progress Notes (Signed)
Virtual behavioral Health Initiative (vBHI) Psychiatric Consultant Case Review   Summary Justin Schmidt is a 37 y.o. 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 Pending neuro referral for snoring. Patient was non adherence to lexapro with concern for medication interaction with Xarelto. He has been non adherent to lexapro.   Psychosocial factors: medical problems including back pain, DVT. 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 acetaminophen-codeine (TYLENOL #3) 300-30 MG tablet Take 1 tablet by mouth at bedtime as needed for moderate pain. 30 tablet 2  . Ascorbic Acid (VITAMIN C) 1000 MG tablet Take 1,000 mg by mouth daily.    . Cholecalciferol (VITAMIN D) 2000 UNITS tablet Take 2,000 Units by mouth daily.    . cyclobenzaprine (FLEXERIL) 5 MG tablet Take 1 tablet (5 mg total) by mouth 2 (two) times daily as needed for muscle spasms. 10 tablet 0  . escitalopram (LEXAPRO) 10 MG tablet Take 1 tablet (10 mg total) by mouth daily. 30 tablet 5  . fluticasone (FLONASE) 50 MCG/ACT nasal spray Place 2 sprays into both nostrils daily. 16 g 2  . furosemide (LASIX) 20 MG tablet Take 1 tablet (20 mg total) by mouth daily. 30 tablet 3  . [START ON 09/23/2017] HYDROcodone-acetaminophen (NORCO/VICODIN) 5-325 MG tablet Take 1 tablet by mouth daily as needed for moderate pain. 30 tablet 0  . HYDROcodone-acetaminophen (NORCO/VICODIN) 5-325 MG tablet Take 1 tablet by mouth daily as needed for moderate pain. 30 tablet 0  . Misc Natural Products (GREEN TEA) TABS Take 315 mg by mouth daily.    . Multiple Vitamin (MULTIVITAMIN WITH MINERALS) TABS Take 1 tablet by mouth daily.    . rivaroxaban (XARELTO) 20 MG TABS tablet TAKE 1 TABLET BY MOUTH ONCE DAILY WITH SUPPER 30 tablet 2   Current  Facility-Administered Medications on File Prior to Visit  Medication Dose Route Frequency Provider Last Rate Last Dose  . sodium chloride irrigation 0.9 %    PRN Tilford Pillar, MD   3,000 mL at 01/27/12 1610     Past psychiatry history Outpatient: denies Psychiatry admission: denies Previous suicide attempt: denies Past trials of medication: lexapro History of violence:   Current measures Depression screen [facility] 2/9 09/08/2017 08/24/2017 03/30/2017 03/17/2017 03/12/2017  Decreased Interest 0  Down, Depressed, Hopeless 0  PHQ - 2 Score 0  Altered sleeping -  Tired, decreased energy -  Change in appetite -  Feeling bad or failure about yourself  3 2 0 0 -  Trouble concentrating 0 0 1 1 -  Moving slowly or fidgety/restless -  Suicidal thoughts 0 3 0 0 -  PHQ-9 Score -  Difficult doing work/chores Very difficult - Somewhat difficult - -  Some recent data might be hidden   GAD 7 : Generalized Anxiety Score 09/11/2016 09/04/2016 08/22/2016 01/04/2016  Nervous, Anxious, on Edge Control/stop worrying Worry too much - different things Trouble relaxing 0  Restless 0 0 0 0  Easily annoyed or irritable Afraid - awful might  happen 0 0 0 0  Total GAD 7 Score Anxiety Difficulty Somewhat difficult - Somewhat difficult Somewhat difficult   Lab Results  Component Value Date   TSH 1.540 08/22/2016    Goals (patient centered) Increase healthy adjustment to current life circumstances   Assessment/Provisional Diagnosis # MDD Will continue lexapro at the current dose given patient has been non adherent to the medication. Noted that there is increased risk of bleeding with concomitant use of xarelto and lexapro/SSRI/SNRI; it is considered benefits of treatment of depression outweighs risk.   Recommendation - Continue lexapro 10 mg daily. Consider future uptitration to 20 mg  daily as indicated.  - BH specialist to work on behavioral activation, problem solving therapy  Thank you for your consult. We will continue to follow the patient. Please contact vBHI  for any questions or concerns.   The above treatment considerations and suggestions are based on consultation with the [facility] specialist and/or PCP and a review of information available in the shared registry and the patient's Electronic Health Record (EHR). I have not personally examined the patient. All recommendations should be implemented with consideration of the patient's relevant prior history and current clinical status. Please feel free to call me with any questions about the care of this patient.

## 2017-09-09 NOTE — BH Specialist Note (Signed)
A user error has taken place: encounter opened in error, closed for administrative reasons.

## 2017-09-09 NOTE — Telephone Encounter (Signed)
This encounter was created in error - please disregard.

## 2017-09-09 NOTE — BH Specialist Note (Signed)
Virtual Behavioral Health Treatment Plan Team Note  MRN: [id number] NAME: Justin Schmidt  DATE: 09/09/17  Start time: 2:35pm End time: 2:45pm Total time: 10 min Total number of Virtual BH Treatment Team Plan encounters: 1/4  Treatment Team Attendees: Dr. Vanetta Shawl (Psychiatrist) & J. Mayford Knife [facility] Specialist)  Virtual behavioral Health Initiative (vBHI) Psychiatric Consultant Case Review   Summary Justin Schmidt is a 37 y.o. 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 Pending neuro referral for snoring. Patient was non adherence to lexapro with concern for medication interaction with Xarelto. He has been non adherent to lexapro.   Psychosocial factors: medical problems including back pain, DVT. Being a caregiver of his 71 grandfather with Alzheimer, unemployment (on disability)   Goals (patient centered) Increase healthy adjustment to current life circumstances   Assessment/Provisional Diagnosis # MDD Will continue lexapro at the current dose given patient has been non adherent to the medication. Noted that there is increased risk of bleeding with concomitant use of xarelto and lexapro/SSRI/SNRI; it is considered benefits of treatment of depression outweighs risk.   Recommendation - Continue lexapro 10 mg daily. Consider future uptitration to 20 mg daily as indicated.  - BH specialist to work on behavioral activation, problem solving therapy  Thank you for your consult. We will continue to follow the patient. Please contact vBHI  for any questions or concerns.   The above treatment considerations and suggestions are based on consultation with the [facility] specialist and/or PCP and a review of information available in the shared registry and the patient's Electronic Health Record (EHR). I have not personally examined the patient. All recommendations should be implemented with consideration of the patient's relevant prior history  and current clinical status. Please feel free to call me with any questions about the care of this patient.    Behavioral Health Specialist Summary  Goals, Interventions and Follow-up Plan Goals: Improve medication compliance and Increase pleasant activities, eg going to the gym Interventions: Behavioral Activation, Medication Monitoring and Psychoeducation and/or Health Education   Summary of Clinical Assessment Summary:   Justin Schmidt is a 37 yo male with depressive symptoms which has increased due to more recent medical problems including swelling, hx of VTE, and chronic back pain. He reported weight increase due to his inability to be more physical which has made him feel more depressed.  Justin Schmidt currently lives and takes care of his 27 grandfather with Alzheimer's and his mother who moved in with them about a year ago with after her throat surgery.  Justin Schmidt is currently unemployed and on disability.  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  He reported general anxiety about his future.  Justin Schmidt is currently taking  Lexapro inconsistently, he reported he takes it twice a week, last time he took it was yesterday.  He was reminded about taking the medication as prescribed by Dr. Oswaldo Done, which is daily, for it to be effective.   Follow-up Plan: Continue VBH services & Med monitoring - Follow up in 1 week and every 2 weeks after that. Consult with PCP & Dr. Vanetta Shawl, psychiatrist.  [facility] Follow up on Wednesday afternoons as preferred by Justin Schmidt.  Justin Schmidt plans to do some weights tonight and walk [place] for his activity this week.   Justin Boyde Ed Blalock, LCSW   Current Medications  Current Outpatient Medications on File Prior to Visit  Medication Sig Dispense Refill  . acetaminophen-codeine (TYLENOL #3) 300-30 MG tablet Take 1 tablet by mouth at bedtime as needed for moderate  pain. 30 tablet 2  . Ascorbic Acid (VITAMIN C) 1000 MG tablet Take 1,000 mg by mouth daily.    . Cholecalciferol (VITAMIN D) 2000 UNITS tablet Take 2,000 Units by mouth daily.    . cyclobenzaprine (FLEXERIL) 5 MG tablet Take 1 tablet (5 mg total) by mouth 2 (two) times daily as needed for muscle spasms. 10 tablet 0  . escitalopram (LEXAPRO) 10 MG tablet Take 1 tablet (10 mg total) by mouth daily. 30 tablet 5  . fluticasone (FLONASE) 50 MCG/ACT nasal spray Place 2 sprays into both nostrils daily. 16 g 2  . furosemide (LASIX) 20 MG tablet Take 1 tablet (20 mg total) by mouth daily. 30 tablet 3  . [START ON 09/23/2017] HYDROcodone-acetaminophen (NORCO/VICODIN) 5-325 MG tablet Take 1 tablet by mouth daily as needed for moderate pain. 30 tablet 0  . HYDROcodone-acetaminophen (NORCO/VICODIN) 5-325 MG tablet Take 1 tablet by mouth daily as needed for moderate pain. 30 tablet 0  . Misc Natural Products (GREEN TEA) TABS Take 315 mg by mouth daily.    . Multiple Vitamin (MULTIVITAMIN WITH MINERALS) TABS Take 1 tablet by mouth daily.    . rivaroxaban (XARELTO) 20 MG TABS tablet TAKE 1 TABLET BY MOUTH ONCE DAILY WITH SUPPER 30 tablet 2            Current Facility-Administered Medications on File Prior to Visit  Medication Dose Route Frequency Provider Last Rate Last Dose  . sodium chloride irrigation 0.9 %    PRN Tilford Pillar, MD   3,000 mL at 01/27/12 1610     Past psychiatry history Outpatient: denies Psychiatry admission: denies Previous suicide attempt: denies Past trials of medication: lexapro History of violence:   Current measures Depression screen [facility] 2/9 09/08/2017 08/24/2017 03/30/2017 03/17/2017 03/12/2017  Decreased Interest 0  Down, Depressed, Hopeless 0  PHQ - 2 Score 0  Altered sleeping -  Tired, decreased energy -  Change in appetite -  Feeling bad or failure about yourself  3 2 0 0 -  Trouble concentrating 0 0 1 1 -   Moving slowly or fidgety/restless -  Suicidal thoughts 0 3 0 0 -  PHQ-9 Score -  Difficult doing work/chores Very difficult - Somewhat difficult - -  Some recent data might be hidden   GAD 7 : Generalized Anxiety Score 09/11/2016 09/04/2016 08/22/2016 01/04/2016  Nervous, Anxious, on Edge Control/stop worrying Worry too much - different things Trouble relaxing 0  Restless 0 0 0 0  Easily annoyed or irritable Afraid - awful might happen 0 0 0 0  Total GAD 7 Score Anxiety Difficulty Somewhat difficult - Somewhat difficult Somewhat difficult        Lab Results  Component Value Date   TSH 1.540 08/22/2016

## 2017-09-13 ENCOUNTER — Other Ambulatory Visit: Payer: Self-pay | Admitting: Pediatrics

## 2017-09-13 DIAGNOSIS — J329 Chronic sinusitis, unspecified: Secondary | ICD-10-CM

## 2017-09-14 ENCOUNTER — Telehealth: Payer: Self-pay

## 2017-09-14 NOTE — Telephone Encounter (Signed)
Received a voicemail from patient stating that he was trying to get in touch with Dr. Gweneth Dimitri and to please return his call. When I called the patient back at the number he left-[phone] there was no answer and no voicemail is set up.

## 2017-09-16 DIAGNOSIS — I890 Lymphedema, not elsewhere classified: Secondary | ICD-10-CM | POA: Diagnosis not present

## 2017-09-17 ENCOUNTER — Ambulatory Visit: Payer: Medicare Other | Admitting: Pediatrics

## 2017-09-17 DIAGNOSIS — I890 Lymphedema, not elsewhere classified: Secondary | ICD-10-CM | POA: Diagnosis not present

## 2017-09-18 ENCOUNTER — Telehealth: Payer: Self-pay | Admitting: Clinical

## 2017-09-18 DIAGNOSIS — F329 Major depressive disorder, single episode, unspecified: Secondary | ICD-10-CM

## 2017-09-18 DIAGNOSIS — F32A Depression, unspecified: Secondary | ICD-10-CM

## 2017-09-18 NOTE — BH Specialist Note (Signed)
[place] Virtual BH Telephone Follow-up  MRN: [id number] NAME: TORREY BALLINAS Date: 09/18/17  Start time: 3:05pm  End time: 3:33 PM  Total time: 28 min Call number: 2/6  Reason for call today: Reason for Contact: Medication Screening  PHQ-9 Scores:  Depression screen [facility] 2/9 09/08/2017 08/24/2017 03/30/2017 03/17/2017 03/12/2017  Decreased Interest 0  Down, Depressed, Hopeless 0  PHQ - 2 Score 0  Altered sleeping -  Tired, decreased energy -  Change in appetite -  Feeling bad or failure about yourself  3 2 0 0 -  Trouble concentrating 0 0 1 1 -  Moving slowly or fidgety/restless -  Suicidal thoughts 0 3 0 0 -  PHQ-9 Score -  Difficult doing work/chores Very difficult - Somewhat difficult - -  Some recent data might be hidden   GAD-7 Scores:  GAD 7 : Generalized Anxiety Score 09/11/2016 09/04/2016 08/22/2016 01/04/2016  Nervous, Anxious, on Edge Control/stop worrying Worry too much - different things Trouble relaxing 0  Restless 0 0 0 0  Easily annoyed or irritable Afraid - awful might happen 0 0 0 0  Total GAD 7 Score Anxiety Difficulty Somewhat difficult - Somewhat difficult Somewhat difficult    Stress Current stressors:  Still unable to get to the gym, being overweight Sleep: Sleep: Difficulty falling asleep(Although it's been better, was having difficulty due to legs) Appetite:   Coping ability:   Patient taking medications as prescribed: Patient taking medications as prescribed: Yes(Taken it daily)  Current medications:  Outpatient Encounter Medications as of 09/18/2017  Medication Sig  . acetaminophen-codeine (TYLENOL #3) 300-30 MG tablet Take 1 tablet by mouth at bedtime as needed for moderate pain.  . Ascorbic Acid (VITAMIN C) 1000 MG tablet Take 1,000 mg by mouth daily.  . Cholecalciferol (VITAMIN D) 2000 UNITS tablet Take 2,000 Units by mouth  daily.  . cyclobenzaprine (FLEXERIL) 5 MG tablet Take 1 tablet (5 mg total) by mouth 2 (two) times daily as needed for muscle spasms.  Marland Kitchen escitalopram (LEXAPRO) 10 MG tablet Take 1 tablet (10 mg total) by mouth daily.  . fluticasone (FLONASE) 50 MCG/ACT nasal spray SPRAY 2 SPRAYS INTO EACH NOSTRIL EVERY DAY  . furosemide (LASIX) 20 MG tablet Take 1 tablet (20 mg total) by mouth daily.  Melene Muller ON 09/23/2017] HYDROcodone-acetaminophen (NORCO/VICODIN) 5-325 MG tablet Take 1 tablet by mouth daily as needed for moderate pain.  Marland Kitchen HYDROcodone-acetaminophen (NORCO/VICODIN) 5-325 MG tablet Take 1 tablet by mouth daily as needed for moderate pain.  . Misc Natural Products (GREEN TEA) TABS Take 315 mg by mouth daily.  . Multiple Vitamin (MULTIVITAMIN WITH MINERALS) TABS Take 1 tablet by mouth daily.  . rivaroxaban (XARELTO) 20 MG TABS tablet TAKE 1 TABLET BY MOUTH ONCE DAILY WITH SUPPER   Facility-Administered Encounter Medications as of 09/18/2017  Medication  . sodium chloride irrigation 0.9 %     Substance Use Assessment Alcohol Use Disorder Identification Test (AUDIT):  Alcohol Use Disorder Test (AUDIT) 09/08/2017  1. How often do you have a drink containing alcohol? 2  2. How many drinks containing alcohol do you have on a typical day when you  are drinking? 0  3. How often do you have six or more drinks on one occasion? 0  AUDIT-C Score 2     Goals, Interventions and Follow-up Plan Goals: Improve medication compliance and Increase pleasant activties, eg going to gym Interventions: Behavioral Activation and Medication Monitoring    Summary:  Mr. Archila reported he's doing better today.  He reported he's been taking the Lexapro every day and denied any side effects.  Mr. Fooks has been struggling to get back to the gym after multiple medical problems.  He reported his goal is still to go to the gym since that helps him feel better being there.  He will try to do weights today at home when it's  cooler.  He was able to identify other things that he enjoys doing, eg talking to his friends.  Follow-up Plan: VBH Team to follow up in 2 weeks - med monitoring & behavioral activation  Pt's plan for the next 2 weeks:  Give his friends a call  Gave him [facility] information again for his grandfather & mother and he will try to follow up with them for more information  Gordy Savers, LCSW

## 2017-09-23 DIAGNOSIS — I890 Lymphedema, not elsewhere classified: Secondary | ICD-10-CM | POA: Diagnosis not present

## 2017-09-24 DIAGNOSIS — I890 Lymphedema, not elsewhere classified: Secondary | ICD-10-CM | POA: Diagnosis not present

## 2017-09-30 DIAGNOSIS — I890 Lymphedema, not elsewhere classified: Secondary | ICD-10-CM | POA: Diagnosis not present

## 2017-10-05 DIAGNOSIS — I890 Lymphedema, not elsewhere classified: Secondary | ICD-10-CM | POA: Diagnosis not present

## 2017-10-07 DIAGNOSIS — I890 Lymphedema, not elsewhere classified: Secondary | ICD-10-CM | POA: Diagnosis not present

## 2017-10-09 ENCOUNTER — Telehealth: Payer: Self-pay | Admitting: Clinical

## 2017-10-09 NOTE — Telephone Encounter (Signed)
This BH intern was not able to leave a message to call back with name and contact information. Patient's voicemail is not set up.   Luvenia StarchSudheera Ranaweera, M.A.  Behavioral Health Intern

## 2017-10-12 DIAGNOSIS — I890 Lymphedema, not elsewhere classified: Secondary | ICD-10-CM | POA: Diagnosis not present

## 2017-10-15 DIAGNOSIS — I890 Lymphedema, not elsewhere classified: Secondary | ICD-10-CM | POA: Diagnosis not present

## 2017-10-20 DIAGNOSIS — M79672 Pain in left foot: Secondary | ICD-10-CM | POA: Diagnosis not present

## 2017-10-20 DIAGNOSIS — M7989 Other specified soft tissue disorders: Secondary | ICD-10-CM | POA: Diagnosis not present

## 2017-10-20 DIAGNOSIS — S96912A Strain of unspecified muscle and tendon at ankle and foot level, left foot, initial encounter: Secondary | ICD-10-CM | POA: Diagnosis not present

## 2017-10-22 ENCOUNTER — Telehealth: Payer: Self-pay

## 2017-10-22 NOTE — Telephone Encounter (Signed)
Writer was not able to leave a message to call back with name and contact information. Patient's voicemail is not set up.

## 2017-10-23 ENCOUNTER — Ambulatory Visit (INDEPENDENT_AMBULATORY_CARE_PROVIDER_SITE_OTHER): Payer: Medicare Other | Admitting: Pediatrics

## 2017-10-23 ENCOUNTER — Encounter: Payer: Self-pay | Admitting: Pediatrics

## 2017-10-23 VITALS — BP 135/80 | HR 76 | Temp 97.3°F | Ht 69.0 in | Wt 396.4 lb

## 2017-10-23 DIAGNOSIS — F339 Major depressive disorder, recurrent, unspecified: Secondary | ICD-10-CM

## 2017-10-23 DIAGNOSIS — R609 Edema, unspecified: Secondary | ICD-10-CM

## 2017-10-23 DIAGNOSIS — Z6841 Body Mass Index (BMI) 40.0 and over, adult: Secondary | ICD-10-CM

## 2017-10-23 DIAGNOSIS — G8929 Other chronic pain: Secondary | ICD-10-CM

## 2017-10-23 DIAGNOSIS — M549 Dorsalgia, unspecified: Secondary | ICD-10-CM

## 2017-10-23 MED ORDER — ACETAMINOPHEN-CODEINE #3 300-30 MG PO TABS: 1.0000 | ORAL_TABLET | Freq: Every evening | ORAL | 2 refills | Status: DC | PRN

## 2017-10-23 NOTE — Progress Notes (Signed)
  Subjective:   Patient ID: Justin Schmidt, male    DOB: 11/11/1980, 37 y.o.   MRN: [id number] CC: Medical Management of Chronic Issues  HPI: Justin Schmidt is a 37 y.o. male   Elevated BMI: Lost about 20 pounds since last visit.  Patient has been working on being more active.  Seen earlier this week at urgent care for left foot sprain.  Put in a boot.  He says that has been a very disappointing setback as he cannot be as active as he was hoping to be.  He thinks he did doing too much at the gym.  Mood: Has been better until recent foot injury.  Taking medicines regularly.  Following with virtual behavioral health.  He thinks the 10 mg of Lexapro is doing enough for his mood down.  Back pain: Some days are worse than others.  Having pain medication available helps improve his ability to do his ADLs.  He is more active when his pain is better controlled.  He is very interested in losing weight.  Swelling: Much improved in lower extremities.  Relevant past medical, surgical, family and social history reviewed. Allergies and medications reviewed and updated. Social History   Tobacco Use  Smoking Status Never Smoker  Smokeless Tobacco Never Used   ROS: Per HPI   Objective:    BP 135/80   Pulse 76   Temp (!) 97.3 F (36.3 C) (Oral)   Ht 5\' 9"  (1.753 m)   Wt (!) 396 lb 6.4 oz (179.8 kg)   BMI 58.54 kg/m   Wt Readings from Last 3 Encounters:  10/23/17 (!) 396 lb 6.4 oz (179.8 kg)  08/24/17 (!) 415 lb (188.2 kg)  08/12/17 (!) 411 lb 9.6 oz (186.7 kg)   Gen: NAD, alert, cooperative with exam, NCAT EYES: EOMI, no conjunctival injection, or no icterus CV: NRRR, normal S1/S2, no murmur, distal pulses 2+ b/l Resp: CTABL, no wheezes, normal WOB Abd: +BS, soft, NT.  Ext: No pitting edema, warm Neuro: Alert and oriented MSK: Left foot in walking boot. Skin: Healing abrasions right anterior shin.  Assessment & Plan:  Justin Schmidt was seen today for medical management of chronic  issues.  Diagnoses and all orders for this visit:  Depression, recurrent (HCC) Stable.  Continue continue current medicines.  Class 3 severe obesity with body mass index (BMI) of 50.0 to 59.9 in adult, unspecified obesity type, unspecified whether serious comorbidity present (HCC) Been making good efforts at weight loss.  Discussed next steps patient, open to referral to weight loss clinic.  Continue avoiding sugary and carbohydrate heavy foods. -     Amb Ref to Medical Weight Management  Chronic back pain, unspecified back location, unspecified back pain laterality Stable, continue below -     acetaminophen-codeine (TYLENOL #3) 300-30 MG tablet; Take 1 tablet by mouth at bedtime as needed for moderate pain.  Swelling Improved.  Continue Lasix once a day in the morning.  We will recheck labs next visit.  Follow up plan: Return in about 3 months (around 01/23/2018). Rex Krasarol Ernie Sagrero, MD [facility].

## 2017-10-26 DIAGNOSIS — I890 Lymphedema, not elsewhere classified: Secondary | ICD-10-CM | POA: Diagnosis not present

## 2017-10-28 DIAGNOSIS — I890 Lymphedema, not elsewhere classified: Secondary | ICD-10-CM | POA: Diagnosis not present

## 2017-11-02 DIAGNOSIS — I890 Lymphedema, not elsewhere classified: Secondary | ICD-10-CM | POA: Diagnosis not present

## 2017-11-03 ENCOUNTER — Telehealth: Payer: Self-pay

## 2017-11-03 DIAGNOSIS — F339 Major depressive disorder, recurrent, unspecified: Secondary | ICD-10-CM

## 2017-11-03 NOTE — BH Specialist Note (Signed)
[place] Virtual BH Telephone Follow-up  MRN: [id number] NAME: Justin Schmidt Date: Nov 05, 2017   Total time: 30 minutes Call number: 3/6  Reason for call today: Reason for Contact: PHQ9-2 weeks  PHQ-9 Scores:  Depression screen [facility] 2/9 05-Nov-2017 10/23/2017 09/08/2017 08/24/2017 03/30/2017  Decreased Interest 1 2 1 3 2   Down, Depressed, Hopeless 1 2 1 3 2   PHQ - 2 Score 2 4 2 6 4   Altered sleeping 1 1 1 2 2   Tired, decreased energy 1 2 3 2 3   Change in appetite 0 2 3 3 2   Feeling bad or failure about yourself  2 2 3 2  0  Trouble concentrating 1 0 0 0 1  Moving slowly or fidgety/restless 0 2 2 2 3   Suicidal thoughts 0 0 0 3 0  PHQ-9 Score 7 13 14 20 15   Difficult doing work/chores Not difficult at all Somewhat difficult Very difficult - Somewhat difficult  Some recent data might be hidden    GAD-7 Scores:  GAD 7 : Generalized Anxiety Score Nov 05, 2017 09/11/2016 09/04/2016 08/22/2016  Nervous, Anxious, on Edge 1 1 1 1   Control/stop worrying 1 2 2 2   Worry too much - different things 0 2 2 2   Trouble relaxing 0 2 2 2   Restless 0 0 0 0  Easily annoyed or irritable 0 1 3 3   Afraid - awful might happen 0 0 0 0  Total GAD 7 Score 2 8 10 10   Anxiety Difficulty Not difficult at all Somewhat difficult - Somewhat difficult     Stress Current stressors: Current Stressors: (None reported) Sleep: Sleep: No problems Appetite: Appetite: No problems Coping ability: Coping ability: Normal  Patient taking medications as prescribed: Patient taking medications as prescribed: Yes  Current medications:  Outpatient Encounter Medications as of November 05, 2017  Medication Sig  . acetaminophen-codeine (TYLENOL #3) 300-30 MG tablet Take 1 tablet by mouth at bedtime as needed for moderate pain.  . Ascorbic Acid (VITAMIN C) 1000 MG tablet Take 1,000 mg by mouth daily.  . Cholecalciferol (VITAMIN D) 2000 UNITS tablet Take 2,000 Units by mouth daily.  . cyclobenzaprine (FLEXERIL) 5 MG tablet Take 1 tablet (5 mg  total) by mouth 2 (two) times daily as needed for muscle spasms.  Marland Kitchen escitalopram (LEXAPRO) 10 MG tablet Take 1 tablet (10 mg total) by mouth daily.  . fluticasone (FLONASE) 50 MCG/ACT nasal spray SPRAY 2 SPRAYS INTO EACH NOSTRIL EVERY DAY  . furosemide (LASIX) 20 MG tablet Take 1 tablet (20 mg total) by mouth daily.  . Misc Natural Products (GREEN TEA) TABS Take 315 mg by mouth daily.  . Multiple Vitamin (MULTIVITAMIN WITH MINERALS) TABS Take 1 tablet by mouth daily.  . rivaroxaban (XARELTO) 20 MG TABS tablet TAKE 1 TABLET BY MOUTH ONCE DAILY WITH SUPPER   Facility-Administered Encounter Medications as of 2017-11-05  Medication  . sodium chloride irrigation 0.9 %     Self-harm Behaviors Risk Assessment Self-harm risk factors: Self-harm risk factors: (NA) Patient endorses recent thoughts of harming self:  None   [place] Suicide Severity Rating Scale: No flowsheet data found. C-SRSS 2017-11-05  1. Wish to be Dead No  2. Suicidal Thoughts No  6. Suicide Behavior Question No     Danger to Others Risk Assessment Danger to others risk factors:   Patient endorses recent thoughts of harming others: Notification required: No need or identified person     Substance Use Assessment Patient recently consumed alcohol: Have you recently consumed alcohol?: No  Alcohol Use Disorder Identification Test (AUDIT):  Alcohol Use Disorder Test (AUDIT) 09/08/2017 11/03/2017  1. How often do you have a drink containing alcohol? 2 0  2. How many drinks containing alcohol do you have on a typical day when you are drinking? 0 0  3. How often do you have six or more drinks on one occasion? 0 0  AUDIT-C Score 2 0  Intervention/Follow-up - AUDIT Score <7 follow-up not indicated   Patient recently used drugs: Have you recently used any drugs?: No    Goals, Interventions and Follow-up Plan Goals: Increase healthy adjustment to current life circumstances Interventions: Motivational Interviewing, Behavioral  Activation and Supportive Counseling Follow-up Plan: VBH Phone Follow Up  2, Discuss with Dr. Franchot MimesHisad regarding placemen ton the inactive list for this patient.   Summary:   Patient is a 37 year old male that denies any stressors.  Patient reports that he has been doing well.  Patient reports that he has been working out until her sprained his ankle last week.  Patient reports that he is not having any issues with his medication.  Patent reports an improved mood and decreased anxiety.  Patient denies any issues with his sleep or appetite.    Patient was receiving services through [facility] in the past.  Writer discussed the possibility of having the patient on the inactive list.  Write informed the patient that this will be discussed with the PCP and Dr. Oswaldo DoneVincent.    Phillip HealStevenson, Orville Mena LaVerne, LCAS-A

## 2017-11-03 NOTE — BH Specialist Note (Signed)
Virtual Behavioral Health Treatment Plan Team Note  MRN: [id number] NAME: Justin Schmidt  DATE: 11/03/17  Total time: 15 minutes Total number of Virtual BH Treatment Team Plan encounters: 2/4  Treatment Team Attendees: Phillip Heal and Dr. Vanetta Shawl   Screenings PHQ-9 Assessments:  Depression screen [facility] 2/9 11/03/2017 10/23/2017 09/08/2017  Decreased Interest 1 2 1   Down, Depressed, Hopeless 1 2 1   PHQ - 2 Score 2 4 2   Altered sleeping 1 1 1   Tired, decreased energy 1 2 3   Change in appetite 0 2 3  Feeling bad or failure about yourself  2 2 3   Trouble concentrating 1 0 0  Moving slowly or fidgety/restless 0 2 2  Suicidal thoughts 0 0 0  PHQ-9 Score 7 13 14   Difficult doing work/chores Not difficult at all Somewhat difficult Very difficult  Some recent data might be hidden   GAD-7 Assessments:  GAD 7 : Generalized Anxiety Score 11/03/2017 09/11/2016 09/04/2016 08/22/2016  Nervous, Anxious, on Edge 1 1 1 1   Control/stop worrying 1 2 2 2   Worry too much - different things 0 2 2 2   Trouble relaxing 0 2 2 2   Restless 0 0 0 0  Easily annoyed or irritable 0 1 3 3   Afraid - awful might happen 0 0 0 0  Total GAD 7 Score 2 8 10 10   Anxiety Difficulty Not difficult at all Somewhat difficult - Somewhat difficult    Presenting Problem/Current Symptoms:   Patient is a 37 year old male that denies any stressors.  Patient reports that he has been doing well.  Patient reports that he has been working out until her sprained his ankle last week.  Patient reports that he is not having any issues with his medication.  Patent reports an improved mood and decreased anxiety.  Patient denies any issues with his sleep or appetite.    Patient was receiving services through [facility] in the past.  Writer discussed the possibility of having the patient on the inactive list.  Write informed the patient that this will be discussed with the PCP and Dr. Oswaldo Done.     Diagnoses:    ICD-10-CM   1. Depression,  recurrent (HCC) F33.9     Psychiatric History  Past Psychiatric History/Hospitalization(s): Anxiety: No Bipolar Disorder: No Depression: Yes Mania: No Psychosis: No Schizophrenia: No Personality Disorder: No Hospitalization for psychiatric illness: No History of Electroconvulsive Shock Therapy: No Prior Suicide Attempts: No   Stress   Virtual BH Phone Follow Up from 11/03/2017 in [facility]  Current Stressors  -- [facility] General reported]  Familial Stressors  None  Sleep  No problems  Appetite  No problems  Coping ability  Normal  Patient taking medications as prescribed  Yes        Self-harm Behaviors Risk Assessment   Virtual BH Phone Follow Up from 11/03/2017 in [facility]  Self-harm risk factors  -- [NA]       Allergies:  Allergies as of 11/03/2017  . (No Known Allergies)   Medication History Current medications:  Outpatient Encounter Medications as of 11/03/2017  Medication Sig  . acetaminophen-codeine (TYLENOL #3) 300-30 MG tablet Take 1 tablet by mouth at bedtime as needed for moderate pain.  . Ascorbic Acid (VITAMIN C) 1000 MG tablet Take 1,000 mg by mouth daily.  . Cholecalciferol (VITAMIN D) 2000 UNITS tablet Take 2,000 Units by mouth daily.  . cyclobenzaprine (FLEXERIL) 5 MG tablet Take 1 tablet (5 mg total) by  mouth 2 (two) times daily as needed for muscle spasms.  Marland Kitchen. escitalopram (LEXAPRO) 10 MG tablet Take 1 tablet (10 mg total) by mouth daily.  . fluticasone (FLONASE) 50 MCG/ACT nasal spray SPRAY 2 SPRAYS INTO EACH NOSTRIL EVERY DAY  . furosemide (LASIX) 20 MG tablet Take 1 tablet (20 mg total) by mouth daily.  . Misc Natural Products (GREEN TEA) TABS Take 315 mg by mouth daily.  . Multiple Vitamin (MULTIVITAMIN WITH MINERALS) TABS Take 1 tablet by mouth daily.  . rivaroxaban (XARELTO) 20 MG TABS tablet TAKE 1 TABLET BY MOUTH ONCE DAILY WITH SUPPER   Facility-Administered Encounter Medications as of 11/03/2017   Medication  . sodium chloride irrigation 0.9 %    Psychotropic Medication Management:   1. Medication:   Indication:  Date started:   Date(s) changed:   Taking as prescribed:   Positive effects/relief of symptoms:  Negative side effects:  2. Medication:   Indication:  Date started:   Date(s) changed:   Taking as prescribed:   Positive effects/relief of symptoms:  Negative side effects:   3.  Medication:   Indication:  Date started:   Date(s) changed:   Taking as prescribed:   Positive effects/relief of symptoms:  Negative side effects:  Medication Management Recommendations:   Goals, Interventions and Follow-up Plan Goals: Increase healthy adjustment to current life circumstances Interventions: Motivational Interviewing Behavioral Activation Supportive Counseling Follow-up Plan: VBH Phone Follow Up  2, Discuss with Dr. Franchot MimesHisad regarding placemen ton the inactive list for this patient.   Scribe for Treatment Team: Linton RumpStevenson, Alessio Bogan [place], LCAS-A

## 2017-11-05 DIAGNOSIS — I890 Lymphedema, not elsewhere classified: Secondary | ICD-10-CM | POA: Diagnosis not present

## 2017-11-10 ENCOUNTER — Telehealth: Payer: Self-pay

## 2017-11-10 DIAGNOSIS — I890 Lymphedema, not elsewhere classified: Secondary | ICD-10-CM | POA: Diagnosis not present

## 2017-11-10 NOTE — Telephone Encounter (Signed)
VBH - Left Msg 

## 2017-11-11 NOTE — Progress Notes (Signed)
The patient declined to continue vBHI service as he is doing better. Will sign off. Noted that it is generally recommended to stay on effective dose of antidepressant at least for 6-9 months for people who has first episode of depression. Please contact us if you have any questions or concerns.

## 2017-11-12 DIAGNOSIS — I890 Lymphedema, not elsewhere classified: Secondary | ICD-10-CM | POA: Diagnosis not present

## 2017-11-16 ENCOUNTER — Telehealth: Payer: Self-pay

## 2017-11-16 NOTE — Telephone Encounter (Signed)
Patient has maintained a lowered PHQ9 score. Patient has reported that he no longer needs VBH services.   Writer consulted with Dr. Vanetta ShawlHisada and the patient will be on the inactive list and this information will be routed to Dr. Oswaldo DoneVincent .

## 2017-11-17 DIAGNOSIS — I890 Lymphedema, not elsewhere classified: Secondary | ICD-10-CM | POA: Diagnosis not present

## 2017-11-19 DIAGNOSIS — I890 Lymphedema, not elsewhere classified: Secondary | ICD-10-CM | POA: Diagnosis not present

## 2017-11-23 DIAGNOSIS — I872 Venous insufficiency (chronic) (peripheral): Secondary | ICD-10-CM | POA: Diagnosis not present

## 2017-11-27 DIAGNOSIS — I890 Lymphedema, not elsewhere classified: Secondary | ICD-10-CM | POA: Diagnosis not present

## 2017-11-30 DIAGNOSIS — I890 Lymphedema, not elsewhere classified: Secondary | ICD-10-CM | POA: Diagnosis not present

## 2017-12-01 ENCOUNTER — Ambulatory Visit (INDEPENDENT_AMBULATORY_CARE_PROVIDER_SITE_OTHER): Payer: Medicare Other | Admitting: *Deleted

## 2017-12-01 VITALS — BP 112/67 | HR 86 | Ht 69.0 in | Wt >= 6400 oz

## 2017-12-01 DIAGNOSIS — Z0000 Encounter for general adult medical examination without abnormal findings: Secondary | ICD-10-CM

## 2017-12-01 DIAGNOSIS — Z23 Encounter for immunization: Secondary | ICD-10-CM

## 2017-12-01 NOTE — Progress Notes (Addendum)
Subjective:   Justin Schmidt is a 37 y.o. male who presents for an Initial Medicare Annual Wellness Visit.  [name] lives at home with his grandfather and mother that he takes care of. He exercise at least 3 times weekly by doing cardio, weight lifting or circuit training. He has chickens, a hors, 2 cats and a dog that he takes care of also for his grandfather and mother.  [name] had one hospitalization in the past year due to blood clots and overall feels that his health is the same and "somewhat" better than it was last year     Objective:    Today's Vitals   12/01/17 1410  BP: 112/67  Pulse: 86  Weight: (!) 408 lb (185.1 kg)  Height: 5\' 9"  (1.753 m)   Body mass index is 60.25 kg/m.  Advanced Directives 08/12/2017 05/04/2017 03/14/2017 01/29/2017 01/28/2017 11/20/2016 09/24/2016  Does Patient Have a Medical Advance Directive? No No No No No No No  Would patient like information on creating a medical advance directive? No - Patient declined No - Patient declined - No - Patient declined No - Patient declined - -  Pre-existing out of facility DNR order (yellow form or pink MOST form) - - - - - - -    Current Medications (verified) Outpatient Encounter Medications as of 12/01/2017  Medication Sig  . acetaminophen-codeine (TYLENOL #3) 300-30 MG tablet Take 1 tablet by mouth at bedtime as needed for moderate pain.  . Ascorbic Acid (VITAMIN C) 1000 MG tablet Take 1,000 mg by mouth daily.  . cyclobenzaprine (FLEXERIL) 5 MG tablet Take 1 tablet (5 mg total) by mouth 2 (two) times daily as needed for muscle spasms.  Marland Kitchen escitalopram (LEXAPRO) 10 MG tablet Take 1 tablet (10 mg total) by mouth daily.  . fluticasone (FLONASE) 50 MCG/ACT nasal spray SPRAY 2 SPRAYS INTO EACH NOSTRIL EVERY DAY  . ketoconazole (NIZORAL) 2 % cream Apply topically.  . Multiple Vitamin (MULTIVITAMIN WITH MINERALS) TABS Take 1 tablet by mouth daily.  . rivaroxaban (XARELTO) 20 MG TABS tablet TAKE 1 TABLET BY MOUTH ONCE  DAILY WITH SUPPER  . sulfamethoxazole-trimethoprim (BACTRIM DS,SEPTRA DS) 800-160 MG tablet Take by mouth.  . Cholecalciferol (VITAMIN D) 2000 UNITS tablet Take 2,000 Units by mouth daily.  . furosemide (LASIX) 20 MG tablet Take 1 tablet (20 mg total) by mouth daily. (Patient not taking: Reported on 12/01/2017)  . Misc Natural Products (GREEN TEA) TABS Take 315 mg by mouth daily. (Patient not taking: Reported on 12/01/2017)   Facility-Administered Encounter Medications as of 12/01/2017  Medication  . sodium chloride irrigation 0.9 %    Allergies (verified) Patient has no known allergies.   History: Past Medical History:  Diagnosis Date  . Back pain   . DVT (deep venous thrombosis) (HCC)   . Gallstones   . Hyperlipidemia   . Pre-diabetes   . Pulmonary emboli [facility])    Past Surgical History:  Procedure Laterality Date  . APPENDECTOMY  1991  . CHOLECYSTECTOMY  01/23/2012   Procedure: LAPAROSCOPIC CHOLECYSTECTOMY;  Surgeon: Fabio Bering, MD;  Location: [facility];  Service: General;  Laterality: N/A;  . TONSILLECTOMY     Family History  Problem Relation Age of Onset  . Diabetes Mother   . Diabetes Father   . Hypertension Father   . Hyperlipidemia Father   . Migraines Sister   . Diabetes Maternal Uncle   . Diabetes Maternal Grandmother   . Diabetes Other  Social History   Socioeconomic History  . Marital status: Single    Spouse name: Not on file  . Number of children: Not on file  . Years of education: 12th grade  . Highest education level: High school graduate  Occupational History  . Not on file  Social Needs  . Financial resource strain: Not hard at all  . Food insecurity:    Worry: Never true    Inability: Never true  . Transportation needs:    Medical: No    Non-medical: No  Tobacco Use  . Smoking status: Never Smoker  . Smokeless tobacco: Never Used  Substance and Sexual Activity  . Alcohol use: Yes    Alcohol/week: 0.6 oz    Types: 1 Cans of beer per week     Comment: very rare  . Drug use: No  . Sexual activity: Never  Lifestyle  . Physical activity:    Days per week: 3 days    Minutes per session: 30 min  . Stress: Not at all  Relationships  . Social connections:    Talks on phone: More than three times a week    Gets together: More than three times a week    Attends religious service: Never    Active member of club or organization: No    Attends meetings of clubs or organizations: Never    Relationship status: Never married  Other Topics Concern  . Not on file  Social History Narrative  . Not on file   Tobacco Counseling No tobacco use at this time   Clinical Intake:  Pre-visit preparation completed: No  Pain : No/denies pain     BMI - recorded: 60.3 Nutritional Status: BMI > 30  Obese Nutritional Risks: None Diabetes: No  How often do you need to have someone help you when you read instructions, pamphlets, or other written materials from your doctor or pharmacy?: 1 - Never What is the last grade level you completed in school?: High School Graduate  Interpreter Needed?: No  Information entered by :: Josetta Huddlehanda Miking Usrey, LPN  Activities of Daily Living In your present state of health, do you have any difficulty performing the following activities: 12/01/2017 01/29/2017  Hearing? N N  Vision? N N  Difficulty concentrating or making decisions? N N  Walking or climbing stairs? Y N  Dressing or bathing? N N  Doing errands, shopping? N N  Some recent data might be hidden  due to weight   Immunizations and Health Maintenance Immunization History  Administered Date(s) Administered  . Influenza,inj,Quad PF,6+ Mos 02/01/2015, 02/07/2016  . Td 04/08/2005  Tetanus vaccination given Health Maintenance Due  Topic Date Due  . TETANUS/TDAP  04/09/2015  . INFLUENZA VACCINE  11/26/2017    Patient Care Team: Johna SheriffVincent, Carol L, MD as PCP - General (Internal Medicine)  Indicate any recent Medical Services you may have  received from other than [facility] providers in the past year (date may be approximate).    Assessment:   This is a routine wellness examination for [name].  Hearing/Vision screen No vision or hearing loss at this time  Dietary issues and exercise activities discussed: Current Exercise Habits: Home exercise routine, Time (Minutes): 30, Frequency (Times/Week): 3, Weekly Exercise (Minutes/Week): 90, Exercise limited by: None identified  Goals    None    Eat 3 meals daily that consist of lean proteins, fruits and vegetables Decrease carb intake  Depression Screen PHQ 2/9 Scores 12/01/2017 11/03/2017 10/23/2017 09/08/2017  PHQ - 2  Score 0 2 4 2   PHQ- 9 Score - 7 13 14   Exception Documentation - - - -    Fall Risk Fall Risk  12/01/2017 10/23/2017 03/30/2017 03/26/2017 03/17/2017  Falls in the past year? No No No No No  Number falls in past yr: - - - - -  Injury with Fall? - - - - -    Is the patient's home free of loose throw rugs in walkways, pet beds, electrical cords, etc?   yes      Grab bars in the bathroom? yes      Handrails on the stairs?   yes      Adequate lighting?   yes  Timed Get Up and Go performed:   Cognitive Function: MMSE - Mini Mental State Exam 12/01/2017 11/19/2015 09/29/2014  Orientation to time 5 5 5   Orientation to Place 5 5 5   Registration 3 3 3   Attention/ Calculation 5 5 5   Recall 3 3 3   Language- name 2 objects 2 2 2   Language- repeat 1 1 1   Language- follow 3 step command 3 3 3   Language- read & follow direction 1 1 1   Write a sentence 1 1 1   Copy design 1 1 1   Total score 30 30 30     No memory loss noted     Screening Tests Health Maintenance  Topic Date Due  . TETANUS/TDAP  04/09/2015  . INFLUENZA VACCINE  11/26/2017  . HIV Screening  Completed   Tdap given Qualifies for Shingles Vaccine? declined  Cancer Screenings: Lung: Low Dose CT Chest recommended if Age 54-80 years, 30 pack-year currently smoking OR have quit w/in 15years. Patient does not  qualify. Colorectal: N/A  Additional Screenings:  Hepatitis C Screening:       Plan:   Encouraged to continue to exercise for at least 30 minutes, 3 times weekly.  Encourage to eat healthy meals daily that consist of lean proteins, fruits, and vegetables.  Tetanus Vaccine given.  Encourage to keep follow up appointment with PCP  I have personally reviewed and noted the following in the patient's chart:   . Medical and social history . Use of alcohol, tobacco or illicit drugs  . Current medications and supplements . Functional ability and status . Nutritional status . Physical activity . Advanced directives . List of other physicians . Hospitalizations, surgeries, and ER visits in previous 12 months . Vitals . Screenings to include cognitive, depression, and falls . Referrals and appointments  In addition, I have reviewed and discussed with patient certain preventive protocols, quality metrics, and best practice recommendations. A written personalized care plan for preventive services as well as general preventive health recommendations were provided to patient.     Caryl Bis, LPN   05/03/1094    I have reviewed and agree with the above AWV documentation.  Mechele Claude, M.D.

## 2017-12-01 NOTE — Patient Instructions (Signed)
   Mr. Justin Schmidt , Thank you for taking time to come for your Medicare Wellness Visit. I appreciate your ongoing commitment to your health goals. Please review the following plan we discussed and let me know if I can assist you in the future.   These are the goals we discussed: Goals    None      This is a list of the screening recommended for you and due dates:  Health Maintenance  Topic Date Due  . Tetanus Vaccine  04/09/2015  . Flu Shot  11/26/2017  . HIV Screening  Completed

## 2017-12-03 DIAGNOSIS — I890 Lymphedema, not elsewhere classified: Secondary | ICD-10-CM | POA: Diagnosis not present

## 2017-12-07 DIAGNOSIS — I872 Venous insufficiency (chronic) (peripheral): Secondary | ICD-10-CM | POA: Diagnosis not present

## 2017-12-07 DIAGNOSIS — L03119 Cellulitis of unspecified part of limb: Secondary | ICD-10-CM | POA: Diagnosis not present

## 2017-12-08 ENCOUNTER — Telehealth: Payer: Self-pay | Admitting: Pediatrics

## 2017-12-08 DIAGNOSIS — I890 Lymphedema, not elsewhere classified: Secondary | ICD-10-CM | POA: Diagnosis not present

## 2017-12-09 NOTE — Telephone Encounter (Signed)
Printed off from media and [phone] and notified patient

## 2017-12-10 DIAGNOSIS — I890 Lymphedema, not elsewhere classified: Secondary | ICD-10-CM | POA: Diagnosis not present

## 2017-12-16 ENCOUNTER — Other Ambulatory Visit: Payer: Self-pay | Admitting: Pediatrics

## 2017-12-16 DIAGNOSIS — R609 Edema, unspecified: Secondary | ICD-10-CM

## 2017-12-17 DIAGNOSIS — I890 Lymphedema, not elsewhere classified: Secondary | ICD-10-CM | POA: Diagnosis not present

## 2017-12-19 ENCOUNTER — Other Ambulatory Visit: Payer: Self-pay | Admitting: Pediatrics

## 2017-12-19 DIAGNOSIS — Z86718 Personal history of other venous thrombosis and embolism: Secondary | ICD-10-CM

## 2017-12-22 DIAGNOSIS — I890 Lymphedema, not elsewhere classified: Secondary | ICD-10-CM | POA: Diagnosis not present

## 2017-12-31 DIAGNOSIS — I890 Lymphedema, not elsewhere classified: Secondary | ICD-10-CM | POA: Diagnosis not present

## 2018-01-04 DIAGNOSIS — I890 Lymphedema, not elsewhere classified: Secondary | ICD-10-CM | POA: Diagnosis not present

## 2018-01-06 DIAGNOSIS — I890 Lymphedema, not elsewhere classified: Secondary | ICD-10-CM | POA: Diagnosis not present

## 2018-01-07 ENCOUNTER — Encounter (INDEPENDENT_AMBULATORY_CARE_PROVIDER_SITE_OTHER): Payer: Medicare Other

## 2018-01-11 DIAGNOSIS — I890 Lymphedema, not elsewhere classified: Secondary | ICD-10-CM | POA: Diagnosis not present

## 2018-01-13 ENCOUNTER — Other Ambulatory Visit: Payer: Self-pay | Admitting: Pediatrics

## 2018-01-13 DIAGNOSIS — R609 Edema, unspecified: Secondary | ICD-10-CM

## 2018-01-14 DIAGNOSIS — I890 Lymphedema, not elsewhere classified: Secondary | ICD-10-CM | POA: Diagnosis not present

## 2018-01-19 DIAGNOSIS — I890 Lymphedema, not elsewhere classified: Secondary | ICD-10-CM | POA: Diagnosis not present

## 2018-01-21 ENCOUNTER — Encounter (INDEPENDENT_AMBULATORY_CARE_PROVIDER_SITE_OTHER): Payer: Self-pay

## 2018-01-21 ENCOUNTER — Ambulatory Visit (INDEPENDENT_AMBULATORY_CARE_PROVIDER_SITE_OTHER): Payer: Medicare Other | Admitting: Family Medicine

## 2018-01-21 DIAGNOSIS — I890 Lymphedema, not elsewhere classified: Secondary | ICD-10-CM | POA: Diagnosis not present

## 2018-01-25 ENCOUNTER — Ambulatory Visit (INDEPENDENT_AMBULATORY_CARE_PROVIDER_SITE_OTHER): Payer: Medicare Other | Admitting: Pediatrics

## 2018-01-25 ENCOUNTER — Ambulatory Visit (INDEPENDENT_AMBULATORY_CARE_PROVIDER_SITE_OTHER): Payer: Medicare Other

## 2018-01-25 ENCOUNTER — Encounter: Payer: Self-pay | Admitting: Pediatrics

## 2018-01-25 VITALS — BP 122/74 | HR 90 | Temp 96.7°F | Ht 69.0 in | Wt >= 6400 oz

## 2018-01-25 DIAGNOSIS — M545 Low back pain: Secondary | ICD-10-CM

## 2018-01-25 DIAGNOSIS — M549 Dorsalgia, unspecified: Secondary | ICD-10-CM

## 2018-01-25 DIAGNOSIS — F329 Major depressive disorder, single episode, unspecified: Secondary | ICD-10-CM

## 2018-01-25 DIAGNOSIS — M47816 Spondylosis without myelopathy or radiculopathy, lumbar region: Secondary | ICD-10-CM | POA: Diagnosis not present

## 2018-01-25 DIAGNOSIS — G8929 Other chronic pain: Secondary | ICD-10-CM

## 2018-01-25 DIAGNOSIS — R0683 Snoring: Secondary | ICD-10-CM

## 2018-01-25 DIAGNOSIS — Z6841 Body Mass Index (BMI) 40.0 and over, adult: Secondary | ICD-10-CM | POA: Diagnosis not present

## 2018-01-25 DIAGNOSIS — Z8672 Personal history of thrombophlebitis: Secondary | ICD-10-CM

## 2018-01-25 DIAGNOSIS — F32A Depression, unspecified: Secondary | ICD-10-CM

## 2018-01-25 MED ORDER — ACETAMINOPHEN-CODEINE #3 300-30 MG PO TABS: 1.0000 | ORAL_TABLET | Freq: Every evening | ORAL | 2 refills | Status: DC | PRN

## 2018-01-25 MED ORDER — ESCITALOPRAM OXALATE 20 MG PO TABS: 20.0000 mg | ORAL_TABLET | Freq: Every day | ORAL | 5 refills | Status: DC

## 2018-01-25 NOTE — Patient Instructions (Signed)
Virtual Behavioral Health Contact: [phone]  

## 2018-01-25 NOTE — Progress Notes (Signed)
Subjective:   Patient ID: Justin Schmidt, male    DOB: 1981/02/07, 37 y.o.   MRN: [id number] CC: Medical Management of Chronic Issues  HPI: Justin Schmidt is a 37 y.o. male   Here today with father.  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  He was following with oncology.  Recommended lifelong anticoagulation.  He did have a thrombophilia panel that showed no congenital thrombophilia.  Mood: Ongoing stress at home, taking care of elderly grandfather.  Would like to try increasing Lexapro to see if any improvement.  He was safe at home.  Feeling tired much of the time.  Has not yet been followed up.  Father often drives him to appointments because he worries about pt falling asleep.  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  Was previously followed by orthopedics.  Asks about getting back in to be seen. Medication and dose: Tylenol 3, #30 tabs a month # pills per month: #30 Last UDS date: 08/2016, due Opioid Treatment Agreement signed (Y/N): Y  Opioid Treatment Agreement last reviewed with patient:   at each visit [place] reviewed this encounter (include red flags):   reviewed, appropriate. 6/26 had 12 tabs hydrocodone sent in from urgent care following ankle injury.   Relevant past medical, surgical, family and social history reviewed. Allergies and medications reviewed and updated. Social History   Tobacco Use  Smoking Status Never Smoker  Smokeless Tobacco Never Used   ROS: Per HPI   Objective:    BP 122/74   Pulse 90   Temp (!) 96.7 F (35.9 C) (Oral)   Ht _0  (1.753 m)   Wt (!) 425 lb (192.8 kg)   BMI 62.76 kg/m   Wt Readings from Last 3 Encounters:  01/25/18 (!) 425 lb (192.8 kg)  12/01/17 (!) 408 lb (185.1 kg)  10/23/17 (!) 396 lb 6.4 oz (179.8 kg)    Gen: NAD, alert, cooperative with exam, NCAT EYES: EOMI, no  conjunctival injection, or no icterus ENT:  TMs pearly gray b/l, OP without erythema LYMPH: no cervical LAD CV: NRRR, normal S1/S2, no murmur, distal pulses 2+ b/l Resp: CTABL, no wheezes, normal WOB Abd: +BS, soft, NTND. no guarding or organomegaly Ext: No edema, warm Neuro: Alert and oriented, strength equal b/l UE and LE, coordination grossly normal MSK: normal muscle bulk  Assessment & Plan:  Jaliel was seen today for medical management of chronic issues.  Diagnoses and all orders for this visit:  Snoring High risk for sleep apnea.  Do not take medicines that can cause increased sleepiness before bed. -     Ambulatory referral to Sleep Studies  Depression, unspecified depression type We will increase to 20 mg.  Phone number for virtual behavioral health given. -     escitalopram (LEXAPRO) 20 MG tablet; Take 1 tablet (20 mg total) by mouth daily.  Chronic midline low back pain without sciatica Continue to encourage weight loss.  Will get x-ray, refer back to orthopedics.  Increased #45 tabs in a month of Tylenol 3, enough to take 2 every other day. -     Ambulatory referral to Orthopedic Surgery -     DG Lumbar Spine 2-3 Views; Future -     acetaminophen-codeine (TYLENOL #3) 300-30 MG tablet; Take 1 tablet by mouth at bedtime as needed for moderate pain.  Chronic back pain, unspecified back location, unspecified back pain  laterality Recheck kidney function, slightly elevated -     BMP8+EGFR  H/O deep vein thrombophlebitis of lower extremity Continue Xarelto.  Patient has about stopping it.  Repeat blood clot  Elevated BMI Was scheduled with medical weight management.  Patient canceled appointment, has not yet rescheduled.  Encouraged follow-up with them.  Start small amounts of exercise daily, 5 to 10 minutes at a time and working up from there.  Follow up plan: Return in about 3 months (around 04/26/2018). Assunta Found, MD [place]

## 2018-01-26 ENCOUNTER — Telehealth: Payer: Self-pay | Admitting: Pediatrics

## 2018-01-26 DIAGNOSIS — I890 Lymphedema, not elsewhere classified: Secondary | ICD-10-CM | POA: Diagnosis not present

## 2018-01-26 LAB — BMP8+EGFR
BUN/Creatinine Ratio: 12 (ref 9–20)
BUN: 13 mg/dL (ref 6–20)
CO2: 24 mmol/L (ref 20–29)
Calcium: 9.4 mg/dL (ref 8.7–10.2)
Chloride: 105 mmol/L (ref 96–106)
Creatinine, Ser: 1.09 mg/dL (ref 0.76–1.27)
GFR calc non Af Amer: 86 mL/min/{1.73_m2} (ref >59)
GFR, EST AFRICAN AMERICAN: 100 mL/min/{1.73_m2} (ref >59)
Glucose: 105 mg/dL — ABNORMAL HIGH (ref 65–99)
POTASSIUM: 4.8 mmol/L (ref 3.5–5.2)
Sodium: 143 mmol/L (ref 134–144)

## 2018-01-26 NOTE — Telephone Encounter (Signed)
Idamae Schuller (mother) called asking if patient's WBCs and Platelets were checked at last appt.  Informed these were not check but were checked in April which was normal.  Mother states that she has a genetic blood cancer and would like for patient's WBCs and platelets to be checked.

## 2018-01-27 NOTE — Telephone Encounter (Signed)
Can add on CBC w diff?

## 2018-01-27 NOTE — Telephone Encounter (Signed)
Aware. Lab can be done on next visit.

## 2018-01-29 DIAGNOSIS — I890 Lymphedema, not elsewhere classified: Secondary | ICD-10-CM | POA: Diagnosis not present

## 2018-02-14 ENCOUNTER — Other Ambulatory Visit: Payer: Self-pay | Admitting: Pediatrics

## 2018-02-14 DIAGNOSIS — M549 Dorsalgia, unspecified: Principal | ICD-10-CM

## 2018-02-14 DIAGNOSIS — G8929 Other chronic pain: Secondary | ICD-10-CM

## 2018-02-15 ENCOUNTER — Other Ambulatory Visit: Payer: Self-pay | Admitting: Pediatrics

## 2018-02-15 DIAGNOSIS — F329 Major depressive disorder, single episode, unspecified: Secondary | ICD-10-CM

## 2018-02-15 DIAGNOSIS — F32A Depression, unspecified: Secondary | ICD-10-CM

## 2018-03-04 ENCOUNTER — Encounter: Payer: Self-pay | Admitting: *Deleted

## 2018-03-19 ENCOUNTER — Other Ambulatory Visit: Payer: Self-pay | Admitting: *Deleted

## 2018-03-19 ENCOUNTER — Other Ambulatory Visit: Payer: Medicare Other

## 2018-03-19 ENCOUNTER — Ambulatory Visit (INDEPENDENT_AMBULATORY_CARE_PROVIDER_SITE_OTHER): Payer: Medicare Other

## 2018-03-19 DIAGNOSIS — Z6841 Body Mass Index (BMI) 40.0 and over, adult: Secondary | ICD-10-CM

## 2018-03-19 DIAGNOSIS — E8881 Metabolic syndrome: Secondary | ICD-10-CM | POA: Diagnosis not present

## 2018-03-19 DIAGNOSIS — R7303 Prediabetes: Secondary | ICD-10-CM

## 2018-03-19 DIAGNOSIS — Z23 Encounter for immunization: Secondary | ICD-10-CM | POA: Diagnosis not present

## 2018-03-19 DIAGNOSIS — Z8672 Personal history of thrombophlebitis: Secondary | ICD-10-CM

## 2018-03-19 DIAGNOSIS — Z86718 Personal history of other venous thrombosis and embolism: Secondary | ICD-10-CM | POA: Diagnosis not present

## 2018-03-19 LAB — CBC WITH DIFFERENTIAL/PLATELET
BASOS ABS: 0.1 10*3/uL (ref 0.0–0.2)
Basos: 1 %
EOS (ABSOLUTE): 0.1 10*3/uL (ref 0.0–0.4)
Eos: 2 %
Hematocrit: 40.9 % (ref 37.5–51.0)
Hemoglobin: 14.3 g/dL (ref 13.0–17.7)
IMMATURE GRANS (ABS): 0 10*3/uL (ref 0.0–0.1)
IMMATURE GRANULOCYTES: 0 %
LYMPHS: 25 %
Lymphocytes Absolute: 1.4 10*3/uL (ref 0.7–3.1)
MCH: 32.3 pg (ref 26.6–33.0)
MCHC: 35 g/dL (ref 31.5–35.7)
MCV: 92 fL (ref 79–97)
Monocytes Absolute: 0.5 10*3/uL (ref 0.1–0.9)
Monocytes: 10 %
NEUTROS PCT: 62 %
Neutrophils Absolute: 3.5 10*3/uL (ref 1.4–7.0)
PLATELETS: 263 10*3/uL (ref 150–450)
RBC: 4.43 x10E6/uL (ref 4.14–5.80)
RDW: 13.1 % (ref 12.3–15.4)
WBC: 5.6 10*3/uL (ref 3.4–10.8)

## 2018-03-29 ENCOUNTER — Telehealth: Payer: Self-pay | Admitting: Pediatrics

## 2018-03-30 NOTE — Telephone Encounter (Signed)
No answer, VM not set up.

## 2018-04-08 NOTE — Telephone Encounter (Signed)
Attempted calls without return call in over 3 days will close encounter.

## 2018-04-16 ENCOUNTER — Other Ambulatory Visit: Payer: Self-pay | Admitting: Pediatrics

## 2018-04-16 DIAGNOSIS — F32A Depression, unspecified: Secondary | ICD-10-CM

## 2018-04-16 DIAGNOSIS — F329 Major depressive disorder, single episode, unspecified: Secondary | ICD-10-CM

## 2018-04-29 ENCOUNTER — Ambulatory Visit: Payer: Medicare Other | Admitting: Pediatrics

## 2018-05-03 ENCOUNTER — Ambulatory Visit: Payer: Medicare Other | Admitting: Pediatrics

## 2018-05-12 ENCOUNTER — Encounter: Payer: Self-pay | Admitting: Pediatrics

## 2018-05-12 ENCOUNTER — Ambulatory Visit (INDEPENDENT_AMBULATORY_CARE_PROVIDER_SITE_OTHER): Payer: Medicare Other | Admitting: Pediatrics

## 2018-05-12 VITALS — BP 118/78 | HR 80 | Temp 98.1°F | Ht 69.0 in | Wt >= 6400 oz

## 2018-05-12 DIAGNOSIS — Z6841 Body Mass Index (BMI) 40.0 and over, adult: Secondary | ICD-10-CM | POA: Diagnosis not present

## 2018-05-12 DIAGNOSIS — R0683 Snoring: Secondary | ICD-10-CM

## 2018-05-12 DIAGNOSIS — G8929 Other chronic pain: Secondary | ICD-10-CM

## 2018-05-12 DIAGNOSIS — F339 Major depressive disorder, recurrent, unspecified: Secondary | ICD-10-CM | POA: Diagnosis not present

## 2018-05-12 DIAGNOSIS — R740 Nonspecific elevation of levels of transaminase and lactic acid dehydrogenase [LDH]: Secondary | ICD-10-CM

## 2018-05-12 DIAGNOSIS — Z86718 Personal history of other venous thrombosis and embolism: Secondary | ICD-10-CM

## 2018-05-12 DIAGNOSIS — M545 Low back pain: Secondary | ICD-10-CM | POA: Diagnosis not present

## 2018-05-12 DIAGNOSIS — R7401 Elevation of levels of liver transaminase levels: Secondary | ICD-10-CM

## 2018-05-12 DIAGNOSIS — R7303 Prediabetes: Secondary | ICD-10-CM | POA: Diagnosis not present

## 2018-05-12 LAB — BAYER DCA HB A1C WAIVED: HB A1C: 6.8 % (ref <7.0)

## 2018-05-12 MED ORDER — ACETAMINOPHEN-CODEINE #3 300-30 MG PO TABS: 1.0000 | ORAL_TABLET | Freq: Every evening | ORAL | 2 refills | Status: DC | PRN

## 2018-05-12 NOTE — Progress Notes (Addendum)
Subjective:   Patient ID: Justin Schmidt, male    DOB: January 13, 1981, 38 y.o.   MRN: [id number] CC: Medical Management of Chronic Issues  HPI: Justin Schmidt is a 38 y.o. male   Here today with father for follow-up  History of unprovoked DVT: Indefinite anticoagulation recommended by hematology.  Patient stopped it several months ago.  Was making him feel cold.  He is taking a baby aspirin daily.  Elevated BMI: Portion control has been hard, has been working at cutting back on sodas.  Not daily drinking Mountain Dew's anymore.  Notices he is more swelling in his legs when he eats out, also sometimes with more loose stools when he eats out versus when he eats at home.  [place], not clear if every day.  He would like to get back to doing more physical activity at the gym that he used to do.  Snoring: Was referred for sleep apnea evaluation, has not yet been seen.  Depression: He does think his mood is been doing okay over the last few months.  Chronic back pain: Ongoing.  Worse when he is not regularly getting exercise. 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 # pills per month: 45 Last UDS date: Due Opioid Treatment Agreement signed (Y/N): Yes [place] reviewed this encounter (include red flags): Yes Yes   Relevant past medical, surgical, family and social history reviewed. Allergies and medications reviewed and updated. Social History   Tobacco Use  Smoking Status Never Smoker  Smokeless Tobacco Never Used   ROS: Per HPI   Objective:    BP 118/78   Pulse 80   Temp 98.1 F (36.7 C) (Oral)   Ht 5' 9" (1.753 m)   Wt (!) 425 lb (192.8 kg)   BMI 62.76 kg/m   Wt Readings from Last 3 Encounters:  05/12/18 (!) 425 lb (192.8 kg)  01/25/18 (!) 425 lb (192.8 kg)  12/01/17 (!) 408 lb (185.1 kg)    Gen: NAD, alert, cooperative with exam, NCAT EYES: EOMI, no conjunctival injection, or no  icterus CV: NRRR, normal S1/S2, no murmur, distal pulses 2+ b/l Resp: CTABL, no wheezes, normal WOB Abd: +BS, soft, NTND. no guarding or organomegaly Ext: No edema, warm Neuro: Alert and oriented, strength equal b/l UE and LE, coordination grossly normal MSK: normal muscle bulk  Assessment & Plan:  Macklin was seen today for medical management of chronic issues.  Diagnoses and all orders for this visit:  Depression, recurrent ([place]) Stable, continue current medicine  BMI 60.0-69.9, adult ([place]) Weight up from last visit.  Recommend following up with nutrition.  Went to 1 weight loss clinic visit in [employer], worried about not being able to keep up with frequency of visits there.  Continue lifestyle changes, cutting back carbohydrates, eating out, portion sizes.  Trying to stick with 4 ounces of meat at dinner, not more.  Fruits and vegetables with each meal.  Avoiding sodas. 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  Indefinite anticoagulation recommended, was seen by oncology, work-up negative.  Patient has stopped his anticoagulation.  He says it makes him feel cold and tired.  Recommended he restart lifelong anticoagulation.  He wants to wait.  Discussed risks of not being on anticoagulation including additional blood clot, PE, heart strain, death.  He is taking a baby aspirin daily.  Chronic midline low back pain without sciatica Continue regular physical activity, refill of below. -     acetaminophen-codeine (TYLENOL #3) 300-30 MG tablet; Take 1 tablet by mouth at bedtime as needed for moderate pain.   Follow up plan: Return in about 3 months (around 08/11/2018). Assunta Found,  MD [place]

## 2018-05-13 LAB — CMP14+EGFR
ALK PHOS: 79 IU/L (ref 39–117)
ALT: 66 IU/L — AB (ref 0–44)
AST: 31 IU/L (ref 0–40)
Albumin/Globulin Ratio: 1.4 (ref 1.2–2.2)
Albumin: 4 g/dL (ref 3.5–5.5)
BUN/Creatinine Ratio: 9 (ref 9–20)
BUN: 9 mg/dL (ref 6–20)
Bilirubin Total: 0.3 mg/dL (ref 0.0–1.2)
CALCIUM: 9.5 mg/dL (ref 8.7–10.2)
CO2: 23 mmol/L (ref 20–29)
CREATININE: 1.05 mg/dL (ref 0.76–1.27)
Chloride: 101 mmol/L (ref 96–106)
GFR calc Af Amer: 104 mL/min/{1.73_m2} (ref >59)
GFR, EST NON AFRICAN AMERICAN: 90 mL/min/{1.73_m2} (ref >59)
GLOBULIN, TOTAL: 2.8 g/dL (ref 1.5–4.5)
GLUCOSE: 107 mg/dL — AB (ref 65–99)
Potassium: 4.6 mmol/L (ref 3.5–5.2)
SODIUM: 143 mmol/L (ref 134–144)
Total Protein: 6.8 g/dL (ref 6.0–8.5)

## 2018-05-17 ENCOUNTER — Other Ambulatory Visit: Payer: Self-pay | Admitting: Pediatrics

## 2018-05-17 DIAGNOSIS — F329 Major depressive disorder, single episode, unspecified: Secondary | ICD-10-CM

## 2018-05-17 DIAGNOSIS — F32A Depression, unspecified: Secondary | ICD-10-CM

## 2018-06-02 ENCOUNTER — Telehealth: Payer: Self-pay

## 2018-06-02 MED ORDER — METFORMIN HCL 500 MG PO TABS: 500.0000 mg | ORAL_TABLET | Freq: Two times a day (BID) | ORAL | 0 refills | Status: DC

## 2018-06-02 NOTE — Telephone Encounter (Signed)
Father aware of lab results.  Metformin 500 mg bid sent to [facility].

## 2018-06-09 ENCOUNTER — Other Ambulatory Visit: Payer: Self-pay | Admitting: Pediatrics

## 2018-06-09 DIAGNOSIS — J329 Chronic sinusitis, unspecified: Secondary | ICD-10-CM

## 2018-08-11 ENCOUNTER — Encounter: Payer: Self-pay | Admitting: Family Medicine

## 2018-08-11 ENCOUNTER — Ambulatory Visit (INDEPENDENT_AMBULATORY_CARE_PROVIDER_SITE_OTHER): Payer: Medicare Other | Admitting: Family Medicine

## 2018-08-11 ENCOUNTER — Other Ambulatory Visit: Payer: Self-pay

## 2018-08-11 DIAGNOSIS — R609 Edema, unspecified: Secondary | ICD-10-CM

## 2018-08-11 DIAGNOSIS — E119 Type 2 diabetes mellitus without complications: Secondary | ICD-10-CM | POA: Diagnosis not present

## 2018-08-11 DIAGNOSIS — E1169 Type 2 diabetes mellitus with other specified complication: Secondary | ICD-10-CM | POA: Insufficient documentation

## 2018-08-11 HISTORY — DX: Type 2 diabetes mellitus without complications: E11.9

## 2018-08-11 MED ORDER — FUROSEMIDE 20 MG PO TABS: 20.0000 mg | ORAL_TABLET | Freq: Every day | ORAL | 0 refills | Status: DC

## 2018-08-11 NOTE — Progress Notes (Signed)
Virtual Visit via telephone Note Due to COVID-19, visit is conducted virtually and was requested by patient.  I connected with Justin Schmidt on 08/11/18 at 1430 by telephone and verified that I am speaking with the correct person using two identifiers. Justin Schmidt is currently located at home and family is currently with them during visit. The provider, Kari Baars, FNP is located in their office at time of visit.  I discussed the limitations, risks, security and privacy concerns of performing an evaluation and management service by telephone and the availability of in person appointments. I also discussed with the patient that there may be a patient responsible charge related to this service. The patient expressed understanding and agreed to proceed.  Subjective:  Patient ID: Justin Schmidt, male    DOB: February 03, 1981, 38 y.o.   MRN: [id number]  Chief Complaint:  Medical Management of Chronic Issues   HPI: Justin QUEBEDEAUX is a 38 y.o. male presenting on 08/11/2018 for Medical Management of Chronic Issues   Pt following up for elevated A1C. Pt states he was unaware of need to start metformin. Pt states he has not started the medication. States he has been dieting and trying to exercise. States he is eating more chicken and vegetables. BS at home has been running 130s-160s. He denies polyuria, polydipsia, or polyphagia.    Relevant past medical, surgical, family, and social history reviewed and updated as indicated.  Allergies and medications reviewed and updated.   Past Medical History:  Diagnosis Date  . Back pain   . DVT (deep venous thrombosis) (HCC)   . Gallstones   . Hyperlipidemia   . Pre-diabetes   . Pulmonary emboli [facility])     Past Surgical History:  Procedure Laterality Date  . APPENDECTOMY  1991  . CHOLECYSTECTOMY  01/23/2012   Procedure: LAPAROSCOPIC CHOLECYSTECTOMY;  Surgeon: Fabio Bering, MD;  Location: [facility];  Service: General;  Laterality: N/A;  . TONSILLECTOMY       Social History   Socioeconomic History  . Marital status: Single    Spouse name: Not on file  . Number of children: Not on file  . Years of education: 12th grade  . Highest education level: High school graduate  Occupational History  . Not on file  Social Needs  . Financial resource strain: Not hard at all  . Food insecurity:    Worry: Never true    Inability: Never true  . Transportation needs:    Medical: No    Non-medical: No  Tobacco Use  . Smoking status: Never Smoker  . Smokeless tobacco: Never Used  Substance and Sexual Activity  . Alcohol use: Yes    Alcohol/week: 1.0 standard drinks    Types: 1 Cans of beer per week    Comment: very rare  . Drug use: No  . Sexual activity: Never  Lifestyle  . Physical activity:    Days per week: 3 days    Minutes per session: 30 min  . Stress: Not at all  Relationships  . Social connections:    Talks on phone: More than three times a week    Gets together: More than three times a week    Attends religious service: Never    Active member of club or organization: No    Attends meetings of clubs or organizations: Never    Relationship status: Never married  . Intimate partner violence:    Fear of current or ex partner: No  Emotionally abused: No    Physically abused: No    Forced sexual activity: No  Other Topics Concern  . Not on file  Social History Narrative  . Not on file    Outpatient Encounter Medications as of 08/11/2018  Medication Sig  . acetaminophen-codeine (TYLENOL #3) 300-30 MG tablet Take 1 tablet by mouth at bedtime as needed for moderate pain.  . Ascorbic Acid (VITAMIN C) 1000 MG tablet Take 1,000 mg by mouth daily.  . Cholecalciferol (VITAMIN D) 2000 UNITS tablet Take 2,000 Units by mouth daily.  . cyclobenzaprine (FLEXERIL) 5 MG tablet Take 1 tablet (5 mg total) by mouth 2 (two) times daily as needed for muscle spasms.  Marland Kitchen escitalopram (LEXAPRO) 10 MG tablet TAKE 1 TABLET BY MOUTH EVERY DAY  .  fluticasone (FLONASE) 50 MCG/ACT nasal spray USE 2 SPRAYS IN EACH NOSTRIL EVERY DAY  . furosemide (LASIX) 20 MG tablet Take 1 tablet (20 mg total) by mouth daily.  Marland Kitchen ketoconazole (NIZORAL) 2 % cream Apply topically.  . metFORMIN (GLUCOPHAGE) 500 MG tablet Take 1 tablet (500 mg total) by mouth 2 (two) times daily with a meal.  . Misc Natural Products (GREEN TEA) TABS Take 315 mg by mouth daily.  . Multiple Vitamin (MULTIVITAMIN WITH MINERALS) TABS Take 1 tablet by mouth daily.  Carlena Hurl 20 MG TABS tablet TAKE 1 TABLET BY MOUTH ONCE DAILY WITH SUPPER  . [DISCONTINUED] furosemide (LASIX) 20 MG tablet TAKE 1 TABLET BY MOUTH EVERY DAY   Facility-Administered Encounter Medications as of 08/11/2018  Medication  . sodium chloride irrigation 0.9 %    No Known Allergies  Review of Systems  Constitutional: Negative for chills, fatigue and fever.  Respiratory: Negative for cough, shortness of breath and wheezing.   Cardiovascular: Positive for leg swelling (minimal in lower extremities). Negative for chest pain and palpitations.  Gastrointestinal: Negative for abdominal distention, abdominal pain, constipation, diarrhea, nausea and vomiting.  Endocrine: Negative for polydipsia, polyphagia and polyuria.  Genitourinary: Negative for decreased urine volume and difficulty urinating.  Neurological: Negative for dizziness, tremors, syncope, weakness, light-headedness and headaches.  Psychiatric/Behavioral: Negative for confusion.  All other systems reviewed and are negative.        Observations/Objective: No vital signs or physical exam, this was a telephone or virtual health encounter.  Pt alert and oriented, answers all questions appropriately, and able to speak in full sentences.    Assessment and Plan: Diagnoses and all orders for this visit:  Type 2 diabetes mellitus without complication, without long-term current use of insulin (HCC) Pt has not started metformin. Pt has been monitoring  blood sugar at home, highest 163, lowest 135. Will see in office next week to recheck A1C and other labs. Pt aware to be fasting so his cholesterol can be checked.   Swelling Requested refill today. States swelling is well controlled with lasix.  -     furosemide (LASIX) 20 MG tablet; Take 1 tablet (20 mg total) by mouth daily.     Follow Up Instructions: Return in about 1 week (around 08/18/2018), or if symptoms worsen or fail to improve, for DM, A1C, in office.    I discussed the assessment and treatment plan with the patient. The patient was provided an opportunity to ask questions and all were answered. The patient agreed with the plan and demonstrated an understanding of the instructions.   The patient was advised to call back or seek an in-person evaluation if the symptoms worsen or if the condition  fails to improve as anticipated.  The above assessment and management plan was discussed with the patient. The patient verbalized understanding of and has agreed to the management plan. Patient is aware to call the clinic if symptoms persist or worsen. Patient is aware when to return to the clinic for a follow-up visit. Patient educated on when it is appropriate to go to the emergency department.    I provided 15 minutes of non-face-to-face time during this encounter. The call started at 1430. The call ended at 1500.   Kari BaarsMichelle Kindel Rochefort, FNP-C [facility] [address] [place], [place] [address] [phone]

## 2018-08-12 ENCOUNTER — Ambulatory Visit: Payer: Medicare Other | Admitting: Family Medicine

## 2018-08-14 ENCOUNTER — Other Ambulatory Visit: Payer: Self-pay | Admitting: Pediatrics

## 2018-08-14 DIAGNOSIS — F329 Major depressive disorder, single episode, unspecified: Secondary | ICD-10-CM

## 2018-08-14 DIAGNOSIS — F32A Depression, unspecified: Secondary | ICD-10-CM

## 2018-08-16 ENCOUNTER — Other Ambulatory Visit: Payer: Self-pay

## 2018-08-17 ENCOUNTER — Ambulatory Visit: Payer: Medicare Other | Admitting: Family Medicine

## 2018-08-19 ENCOUNTER — Ambulatory Visit: Payer: Medicare Other | Admitting: Family Medicine

## 2018-08-30 ENCOUNTER — Ambulatory Visit: Payer: Medicare Other | Admitting: Family Medicine

## 2018-09-01 ENCOUNTER — Ambulatory Visit: Payer: Medicare Other | Admitting: Family Medicine

## 2018-09-02 ENCOUNTER — Other Ambulatory Visit: Payer: Self-pay | Admitting: Family Medicine

## 2018-09-07 ENCOUNTER — Other Ambulatory Visit: Payer: Self-pay | Admitting: *Deleted

## 2018-09-24 ENCOUNTER — Telehealth: Payer: Self-pay | Admitting: Family Medicine

## 2018-10-19 ENCOUNTER — Telehealth: Payer: Self-pay | Admitting: *Deleted

## 2018-10-19 NOTE — Telephone Encounter (Signed)
farxiga is not on his list of meds and is not in last office note- he will need  To be sen for followup

## 2018-10-22 ENCOUNTER — Other Ambulatory Visit: Payer: Self-pay | Admitting: *Deleted

## 2018-10-29 ENCOUNTER — Telehealth: Payer: Self-pay | Admitting: Family Medicine

## 2018-11-05 ENCOUNTER — Other Ambulatory Visit: Payer: Self-pay

## 2018-11-08 ENCOUNTER — Encounter: Payer: Self-pay | Admitting: Family Medicine

## 2018-11-08 ENCOUNTER — Ambulatory Visit (INDEPENDENT_AMBULATORY_CARE_PROVIDER_SITE_OTHER): Payer: Medicare Other | Admitting: Family Medicine

## 2018-11-08 DIAGNOSIS — R609 Edema, unspecified: Secondary | ICD-10-CM

## 2018-11-08 DIAGNOSIS — M545 Low back pain, unspecified: Secondary | ICD-10-CM

## 2018-11-08 DIAGNOSIS — Z6841 Body Mass Index (BMI) 40.0 and over, adult: Secondary | ICD-10-CM

## 2018-11-08 DIAGNOSIS — F339 Major depressive disorder, recurrent, unspecified: Secondary | ICD-10-CM

## 2018-11-08 DIAGNOSIS — Z86711 Personal history of pulmonary embolism: Secondary | ICD-10-CM | POA: Diagnosis not present

## 2018-11-08 DIAGNOSIS — E119 Type 2 diabetes mellitus without complications: Secondary | ICD-10-CM | POA: Diagnosis not present

## 2018-11-08 DIAGNOSIS — G8929 Other chronic pain: Secondary | ICD-10-CM

## 2018-11-08 DIAGNOSIS — Z86718 Personal history of other venous thrombosis and embolism: Secondary | ICD-10-CM | POA: Insufficient documentation

## 2018-11-08 MED ORDER — FUROSEMIDE 20 MG PO TABS: 20.0000 mg | ORAL_TABLET | Freq: Every day | ORAL | 0 refills | Status: DC

## 2018-11-08 MED ORDER — ESCITALOPRAM OXALATE 10 MG PO TABS: 10.0000 mg | ORAL_TABLET | Freq: Every day | ORAL | 0 refills | Status: DC
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
## 2019-03-07 ENCOUNTER — Other Ambulatory Visit: Payer: Self-pay | Admitting: Family Medicine

## 2019-03-07 DIAGNOSIS — F339 Major depressive disorder, recurrent, unspecified: Secondary | ICD-10-CM

## 2019-03-07 DIAGNOSIS — R609 Edema, unspecified: Secondary | ICD-10-CM

## 2019-04-04 ENCOUNTER — Ambulatory Visit (INDEPENDENT_AMBULATORY_CARE_PROVIDER_SITE_OTHER): Payer: Medicare Other | Admitting: Family Medicine

## 2019-04-04 ENCOUNTER — Other Ambulatory Visit: Payer: Self-pay

## 2019-04-04 ENCOUNTER — Encounter: Payer: Self-pay | Admitting: Family Medicine

## 2019-04-04 VITALS — BP 128/76 | HR 90 | Temp 96.4°F | Ht 69.0 in | Wt >= 6400 oz

## 2019-04-04 DIAGNOSIS — E119 Type 2 diabetes mellitus without complications: Secondary | ICD-10-CM

## 2019-04-04 DIAGNOSIS — L03119 Cellulitis of unspecified part of limb: Secondary | ICD-10-CM

## 2019-04-04 DIAGNOSIS — I890 Lymphedema, not elsewhere classified: Secondary | ICD-10-CM | POA: Diagnosis not present

## 2019-04-04 LAB — BAYER DCA HB A1C WAIVED: HB A1C (BAYER DCA - WAIVED): 6.9 % (ref <7.0)

## 2019-04-04 MED ORDER — KETOCONAZOLE 2 % EX CREA: 1.0000 "application " | TOPICAL_CREAM | Freq: Every day | CUTANEOUS | 0 refills | Status: DC

## 2019-04-04 MED ORDER — SULFAMETHOXAZOLE-TRIMETHOPRIM 800-160 MG PO TABS: 1.0000 | ORAL_TABLET | Freq: Two times a day (BID) | ORAL | 0 refills | Status: DC

## 2019-04-04 NOTE — Progress Notes (Signed)
BP 128/76   Pulse 90   Temp (!) 96.4 F (35.8 C) (Temporal)   Ht '5\' 9"'  (1.753 m)   Wt (!) 438 lb 12.8 oz (199 kg)   SpO2 97%   BMI 64.80 kg/m    Subjective:   Patient ID: Justin Schmidt, male    DOB: January 02, 1981, 38 y.o.   MRN: [id number]  HPI: Justin Schmidt is a 38 y.o. male presenting on 04/04/2019 for Edema (both legs swelling)   HPI Patient is coming in with chronic bilateral lower extremity edema that is worsened and has erythema and more weeping over the past couple weeks.  He has noticed this being an increased issue and he has had problems like this before.  He does have compression stockings but has not been using them as consistently as he should.  He says he does not use them when it is sore.  Patient denies any fevers or chills.  It has been almost 11 months since he had blood work done for his diabetes or anything else.  He takes furosemide which does help some.  Relevant past medical, surgical, family and social history reviewed and updated as indicated. Interim medical history since our last visit reviewed. Allergies and medications reviewed and updated.  Review of Systems  Constitutional: Negative for chills and fever.  Respiratory: Negative for shortness of breath and wheezing.   Cardiovascular: Positive for leg swelling (Redness and weeping on bilateral lower extremities on the front of both of his lower legs). Negative for chest pain.  Skin: Positive for color change, rash and wound.  All other systems reviewed and are negative.   Per HPI unless specifically indicated above      Objective:   BP 128/76   Pulse 90   Temp (!) 96.4 F (35.8 C) (Temporal)   Ht '5\' 9"'  (1.753 m)   Wt (!) 438 lb 12.8 oz (199 kg)   SpO2 97%   BMI 64.80 kg/m   Wt Readings from Last 3 Encounters:  04/04/19 (!) 438 lb 12.8 oz (199 kg)  05/12/18 (!) 425 lb (192.8 kg)  01/25/18 (!) 425 lb (192.8 kg)    Physical Exam Vitals signs and nursing note reviewed.  Constitutional:      General: He is not in acute distress.    Appearance: He is well-developed. He is not diaphoretic.  Eyes:     General: No scleral icterus.    Conjunctiva/sclera: Conjunctivae normal.  Neck:     Thyroid: No thyromegaly.  Musculoskeletal: Normal range of motion.        General: Swelling (Redness and weeping on bilateral lower extremities on the front of both of his lower legs) present.  Skin:    General: Skin is warm and dry.     Findings: No rash.  Neurological:     Mental Status: He is alert and oriented to person, place, and time.     Coordination: Coordination normal.  Psychiatric:        Behavior: Behavior normal.       Assessment & Plan:   Problem List Items Addressed This Visit      Endocrine   Type 2 diabetes mellitus without complication, without long-term current use of insulin ([place])   Relevant Orders   CMP14+EGFR   hgba1c   Lipid panel    Other Visit Diagnoses    Cellulitis of lower extremity, unspecified laterality    -  Primary   Relevant Medications   sulfamethoxazole-trimethoprim (BACTRIM DS)  800-160 MG tablet   Other Relevant Orders   CMP14+EGFR   CBC with Differential/Platelet   Chronic acquired lymphedema       Relevant Medications   ketoconazole (NIZORAL) 2 % cream      Will place labs for follow-up with PCP Treat cellulitis with Bactrim and can use ketoconazole for itch Follow-up in 2 weeks with PCP Follow up plan: Return in about 2 weeks (around 04/18/2019), or if symptoms worsen or fail to improve, for Diabetes and bilateral lower extremity edema.  Counseling provided for all of the vaccine components Orders Placed This Encounter  Procedures  . CMP14+EGFR  . hgba1c  . CBC with Differential/Platelet  . Lipid panel    Caryl Pina, MD [facility] 04/04/2019, 12:18 PM

## 2019-04-05 LAB — LIPID PANEL
Chol/HDL Ratio: 3 ratio (ref 0.0–5.0)
Cholesterol, Total: 134 mg/dL (ref 100–199)
HDL: 44 mg/dL (ref >39)
LDL Chol Calc (NIH): 72 mg/dL (ref 0–99)
Triglycerides: 93 mg/dL (ref 0–149)
VLDL Cholesterol Cal: 18 mg/dL (ref 5–40)

## 2019-04-05 LAB — CMP14+EGFR
ALT: 61 IU/L — ABNORMAL HIGH (ref 0–44)
AST: 27 IU/L (ref 0–40)
Albumin/Globulin Ratio: 1.7 (ref 1.2–2.2)
Albumin: 4.3 g/dL (ref 4.0–5.0)
Alkaline Phosphatase: 73 IU/L (ref 39–117)
BUN/Creatinine Ratio: 14 (ref 9–20)
BUN: 13 mg/dL (ref 6–20)
Bilirubin Total: 0.3 mg/dL (ref 0.0–1.2)
CO2: 22 mmol/L (ref 20–29)
Calcium: 9.2 mg/dL (ref 8.7–10.2)
Chloride: 104 mmol/L (ref 96–106)
Creatinine, Ser: 0.91 mg/dL (ref 0.76–1.27)
GFR calc Af Amer: 122 mL/min/{1.73_m2} (ref >59)
GFR calc non Af Amer: 106 mL/min/{1.73_m2} (ref >59)
Globulin, Total: 2.5 g/dL (ref 1.5–4.5)
Glucose: 141 mg/dL — ABNORMAL HIGH (ref 65–99)
Potassium: 4.4 mmol/L (ref 3.5–5.2)
Sodium: 143 mmol/L (ref 134–144)
Total Protein: 6.8 g/dL (ref 6.0–8.5)

## 2019-04-05 LAB — CBC WITH DIFFERENTIAL/PLATELET
Basophils Absolute: 0.1 10*3/uL (ref 0.0–0.2)
Basos: 1 %
EOS (ABSOLUTE): 0.1 10*3/uL (ref 0.0–0.4)
Eos: 2 %
Hematocrit: 40.4 % (ref 37.5–51.0)
Hemoglobin: 14.2 g/dL (ref 13.0–17.7)
Immature Grans (Abs): 0 10*3/uL (ref 0.0–0.1)
Immature Granulocytes: 0 %
Lymphocytes Absolute: 1.2 10*3/uL (ref 0.7–3.1)
Lymphs: 21 %
MCH: 32.1 pg (ref 26.6–33.0)
MCHC: 35.1 g/dL (ref 31.5–35.7)
MCV: 91 fL (ref 79–97)
Monocytes Absolute: 0.5 10*3/uL (ref 0.1–0.9)
Monocytes: 8 %
Neutrophils Absolute: 4 10*3/uL (ref 1.4–7.0)
Neutrophils: 68 %
Platelets: 261 10*3/uL (ref 150–450)
RBC: 4.43 x10E6/uL (ref 4.14–5.80)
RDW: 13.1 % (ref 11.6–15.4)
WBC: 5.9 10*3/uL (ref 3.4–10.8)

## 2019-04-18 ENCOUNTER — Other Ambulatory Visit: Payer: Self-pay

## 2019-04-18 ENCOUNTER — Telehealth: Payer: Self-pay | Admitting: Family Medicine

## 2019-04-19 ENCOUNTER — Other Ambulatory Visit: Payer: Self-pay

## 2019-04-19 NOTE — Telephone Encounter (Signed)
[place] for father to come is his screening is negative

## 2019-04-20 ENCOUNTER — Encounter: Payer: Self-pay | Admitting: Family Medicine

## 2019-04-20 ENCOUNTER — Ambulatory Visit (INDEPENDENT_AMBULATORY_CARE_PROVIDER_SITE_OTHER): Payer: Medicare Other | Admitting: Family Medicine

## 2019-04-20 VITALS — BP 136/83 | HR 80 | Temp 97.8°F | Resp 20 | Ht 69.0 in | Wt >= 6400 oz

## 2019-04-20 DIAGNOSIS — M545 Low back pain, unspecified: Secondary | ICD-10-CM

## 2019-04-20 DIAGNOSIS — L03116 Cellulitis of left lower limb: Secondary | ICD-10-CM | POA: Diagnosis not present

## 2019-04-20 DIAGNOSIS — G8929 Other chronic pain: Secondary | ICD-10-CM

## 2019-04-20 DIAGNOSIS — L03115 Cellulitis of right lower limb: Secondary | ICD-10-CM

## 2019-04-20 DIAGNOSIS — Z79899 Other long term (current) drug therapy: Secondary | ICD-10-CM

## 2019-04-20 DIAGNOSIS — Z23 Encounter for immunization: Secondary | ICD-10-CM

## 2019-04-20 DIAGNOSIS — Z91199 Patient's noncompliance with other medical treatment and regimen due to unspecified reason: Secondary | ICD-10-CM | POA: Insufficient documentation

## 2019-04-20 DIAGNOSIS — E119 Type 2 diabetes mellitus without complications: Secondary | ICD-10-CM

## 2019-04-20 DIAGNOSIS — Z9119 Patient's noncompliance with other medical treatment and regimen: Secondary | ICD-10-CM | POA: Insufficient documentation

## 2019-04-20 MED ORDER — ACETAMINOPHEN-CODEINE #3 300-30 MG PO TABS: 1.0000 | ORAL_TABLET | Freq: Every evening | ORAL | 0 refills | Status: DC | PRN

## 2019-04-20 MED ORDER — ACETAMINOPHEN-CODEINE #3 300-30 MG PO TABS: ORAL_TABLET | ORAL | 0 refills | Status: DC

## 2019-04-20 MED ORDER — DOXYCYCLINE HYCLATE 100 MG PO TABS: 100.0000 mg | ORAL_TABLET | Freq: Two times a day (BID) | ORAL | 0 refills | Status: AC

## 2019-04-20 MED ORDER — METFORMIN HCL 500 MG PO TABS: 500.0000 mg | ORAL_TABLET | Freq: Every day | ORAL | 1 refills | Status: DC
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
## 2019-04-20 NOTE — Patient Instructions (Signed)

## 2019-04-21 LAB — MICROALBUMIN / CREATININE URINE RATIO
Creatinine, Urine: 64.2 mg/dL
Microalb/Creat Ratio: 6 mg/g creat (ref 0–29)
Microalbumin, Urine: 3.7 ug/mL

## 2019-04-24 LAB — TOXASSURE SELECT 13 (MW), URINE

## 2019-07-04 ENCOUNTER — Telehealth: Payer: Self-pay | Admitting: Family Medicine

## 2019-07-04 NOTE — Telephone Encounter (Signed)
Pt's father called to see if it was ok for pt to stop taking his blood thinner Rx. Was told by pt that he isn't having issues with blood clots anymore so he doesn't have to take them. Father wants to confirm this.

## 2019-07-04 NOTE — Telephone Encounter (Signed)
No, he needs to continue the blood thinner. He needs to be seen prior to stopping therapy.

## 2019-07-05 ENCOUNTER — Other Ambulatory Visit: Payer: Self-pay | Admitting: Family Medicine

## 2019-07-05 ENCOUNTER — Other Ambulatory Visit: Payer: Self-pay | Admitting: *Deleted

## 2019-07-05 DIAGNOSIS — I890 Lymphedema, not elsewhere classified: Secondary | ICD-10-CM

## 2019-07-05 DIAGNOSIS — G8929 Other chronic pain: Secondary | ICD-10-CM

## 2019-07-05 DIAGNOSIS — Z79899 Other long term (current) drug therapy: Secondary | ICD-10-CM

## 2019-07-05 DIAGNOSIS — R609 Edema, unspecified: Secondary | ICD-10-CM

## 2019-07-05 NOTE — Telephone Encounter (Signed)
CALLED PATIENT, LEFT MESSAGE TO RETURN CALL 

## 2019-07-08 NOTE — Telephone Encounter (Signed)
Father says son stopped blood thinner about a month earlier,  (claims of stomach issues due to medicine).  He takes two baby aspirins now.. Advised an appointment should be scheduled with provider for in depth discussion.   Dad says he would like to come if he can get him scheduled because son does not comprehend well.

## 2019-07-08 NOTE — Telephone Encounter (Signed)
That is fine 

## 2019-07-14 ENCOUNTER — Other Ambulatory Visit: Payer: Self-pay

## 2019-07-14 ENCOUNTER — Encounter: Payer: Self-pay | Admitting: Family Medicine

## 2019-07-14 ENCOUNTER — Ambulatory Visit (INDEPENDENT_AMBULATORY_CARE_PROVIDER_SITE_OTHER): Payer: Medicare Other | Admitting: Family Medicine

## 2019-07-14 VITALS — BP 120/64 | HR 86 | Temp 97.1°F | Resp 20 | Ht 69.0 in | Wt >= 6400 oz

## 2019-07-14 DIAGNOSIS — E119 Type 2 diabetes mellitus without complications: Secondary | ICD-10-CM | POA: Diagnosis not present

## 2019-07-14 DIAGNOSIS — G8929 Other chronic pain: Secondary | ICD-10-CM | POA: Diagnosis not present

## 2019-07-14 DIAGNOSIS — M545 Low back pain: Secondary | ICD-10-CM | POA: Diagnosis not present

## 2019-07-14 DIAGNOSIS — F339 Major depressive disorder, recurrent, unspecified: Secondary | ICD-10-CM

## 2019-07-14 LAB — BAYER DCA HB A1C WAIVED: HB A1C (BAYER DCA - WAIVED): 6.7 % (ref <7.0)

## 2019-07-14 MED ORDER — CANAGLIFLOZIN 100 MG PO TABS: 100.0000 mg | ORAL_TABLET | Freq: Every day | ORAL | 5 refills | Status: DC

## 2019-07-14 MED ORDER — ACETAMINOPHEN-CODEINE #3 300-30 MG PO TABS: 1.0000 | ORAL_TABLET | Freq: Every evening | ORAL | 0 refills | Status: DC | PRN

## 2019-07-14 MED ORDER — ACETAMINOPHEN-CODEINE #3 300-30 MG PO TABS: 1.0000 | ORAL_TABLET | Freq: Every evening | ORAL | 0 refills | Status: AC | PRN

## 2019-07-14 MED ORDER — ACETAMINOPHEN-CODEINE #3 300-30 MG PO TABS: ORAL_TABLET | ORAL | 0 refills | Status: DC
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
## 2019-07-15 LAB — CBC WITH DIFFERENTIAL/PLATELET
Basophils Absolute: 0.1 10*3/uL (ref 0.0–0.2)
Basos: 1 %
EOS (ABSOLUTE): 0.1 10*3/uL (ref 0.0–0.4)
Eos: 2 %
Hematocrit: 42.6 % (ref 37.5–51.0)
Hemoglobin: 14.6 g/dL (ref 13.0–17.7)
Immature Grans (Abs): 0 10*3/uL (ref 0.0–0.1)
Immature Granulocytes: 0 %
Lymphocytes Absolute: 1.5 10*3/uL (ref 0.7–3.1)
Lymphs: 24 %
MCH: 31.7 pg (ref 26.6–33.0)
MCHC: 34.3 g/dL (ref 31.5–35.7)
MCV: 93 fL (ref 79–97)
Monocytes Absolute: 0.5 10*3/uL (ref 0.1–0.9)
Monocytes: 8 %
Neutrophils Absolute: 4 10*3/uL (ref 1.4–7.0)
Neutrophils: 65 %
Platelets: 258 10*3/uL (ref 150–450)
RBC: 4.6 x10E6/uL (ref 4.14–5.80)
RDW: 12.7 % (ref 11.6–15.4)
WBC: 6.1 10*3/uL (ref 3.4–10.8)

## 2019-07-15 LAB — CMP14+EGFR
ALT: 57 IU/L — ABNORMAL HIGH (ref 0–44)
AST: 31 IU/L (ref 0–40)
Albumin/Globulin Ratio: 1.6 (ref 1.2–2.2)
Albumin: 4.3 g/dL (ref 4.0–5.0)
Alkaline Phosphatase: 72 IU/L (ref 39–117)
BUN/Creatinine Ratio: 12 (ref 9–20)
BUN: 14 mg/dL (ref 6–20)
Bilirubin Total: 0.3 mg/dL (ref 0.0–1.2)
CO2: 26 mmol/L (ref 20–29)
Calcium: 9.4 mg/dL (ref 8.7–10.2)
Chloride: 101 mmol/L (ref 96–106)
Creatinine, Ser: 1.18 mg/dL (ref 0.76–1.27)
GFR calc Af Amer: 89 mL/min/{1.73_m2} (ref >59)
GFR calc non Af Amer: 77 mL/min/{1.73_m2} (ref >59)
Globulin, Total: 2.7 g/dL (ref 1.5–4.5)
Glucose: 111 mg/dL — ABNORMAL HIGH (ref 65–99)
Potassium: 4.4 mmol/L (ref 3.5–5.2)
Sodium: 140 mmol/L (ref 134–144)
Total Protein: 7 g/dL (ref 6.0–8.5)

## 2019-07-15 LAB — LIPID PANEL
Chol/HDL Ratio: 3.9 ratio (ref 0.0–5.0)
Cholesterol, Total: 155 mg/dL (ref 100–199)
HDL: 40 mg/dL (ref >39)
LDL Chol Calc (NIH): 87 mg/dL (ref 0–99)
Triglycerides: 162 mg/dL — ABNORMAL HIGH (ref 0–149)
VLDL Cholesterol Cal: 28 mg/dL (ref 5–40)

## 2019-07-17 NOTE — Progress Notes (Signed)
CBC normal.  Glucose high at 111. Diet, exercise, and medications as discussed.  Renal function is normal.  ALT is slightly elevated. Need to limit intake of Tylenol and alcohol.  Triglycerides are high at 162. Need to limit intake of fried, greasy, fatty, and fast foods.

## 2019-07-19 ENCOUNTER — Telehealth: Payer: Self-pay | Admitting: Family Medicine

## 2019-07-19 NOTE — Chronic Care Management (AMB) (Signed)
  Chronic Care Management   Note  07/19/2019 Name: Justin Schmidt MRN: [id number] DOB: 1980/07/30  Justin Schmidt is a 39 y.o. year old male who is a primary care patient of Loman Brooklyn, FNP. I reached out to Justin Schmidt by phone today in response to a referral sent by Mr. JORMA TASSINARI health plan.     Mr. Dudek was given information about Chronic Care Management services today including:  1. CCM service includes personalized support from designated clinical staff supervised by his physician, including individualized plan of care and coordination with other care providers 2. 24/7 contact phone numbers for assistance for urgent and routine care needs. 3. Service will only be billed when office clinical staff spend 20 minutes or more in a month to coordinate care. 4. Only one practitioner may furnish and bill the service in a calendar month. 5. The patient may stop CCM services at any time (effective at the end of the month) by phone call to the office staff. 6. The patient will be responsible for cost sharing (co-pay) of up to 20% of the service fee (after annual deductible is met).  Patient agreed to services and verbal consent obtained.   Follow up plan: Telephone appointment with care management team member scheduled for:01/14/2020.  [place], [place] [address] Direct Dial: [phone] [email or web address]: [email or web address]

## 2019-07-22 ENCOUNTER — Ambulatory Visit: Payer: Medicare Other | Admitting: Family Medicine

## 2019-08-03 ENCOUNTER — Encounter: Payer: Self-pay | Admitting: *Deleted

## 2019-08-29 ENCOUNTER — Other Ambulatory Visit: Payer: Self-pay | Admitting: *Deleted

## 2019-08-29 DIAGNOSIS — M545 Low back pain, unspecified: Secondary | ICD-10-CM

## 2019-08-29 DIAGNOSIS — G8929 Other chronic pain: Secondary | ICD-10-CM

## 2019-08-29 DIAGNOSIS — F339 Major depressive disorder, recurrent, unspecified: Secondary | ICD-10-CM

## 2019-08-29 MED ORDER — ESCITALOPRAM OXALATE 10 MG PO TABS: 10.0000 mg | ORAL_TABLET | Freq: Every day | ORAL | 1 refills | Status: DC

## 2019-09-07 DIAGNOSIS — M25562 Pain in left knee: Secondary | ICD-10-CM | POA: Diagnosis not present

## 2019-09-12 DIAGNOSIS — M1712 Unilateral primary osteoarthritis, left knee: Secondary | ICD-10-CM | POA: Diagnosis not present

## 2019-09-12 DIAGNOSIS — M25562 Pain in left knee: Secondary | ICD-10-CM | POA: Diagnosis not present

## 2019-09-29 ENCOUNTER — Other Ambulatory Visit: Payer: Self-pay | Admitting: Family Medicine

## 2019-09-29 DIAGNOSIS — F339 Major depressive disorder, recurrent, unspecified: Secondary | ICD-10-CM

## 2019-10-05 ENCOUNTER — Other Ambulatory Visit: Payer: Self-pay | Admitting: *Deleted

## 2019-10-05 DIAGNOSIS — R609 Edema, unspecified: Secondary | ICD-10-CM

## 2019-10-05 DIAGNOSIS — G8929 Other chronic pain: Secondary | ICD-10-CM

## 2019-10-05 MED ORDER — FUROSEMIDE 20 MG PO TABS: 20.0000 mg | ORAL_TABLET | Freq: Every day | ORAL | 0 refills | Status: DC

## 2019-10-14 ENCOUNTER — Encounter: Payer: Self-pay | Admitting: Family Medicine

## 2019-10-14 ENCOUNTER — Other Ambulatory Visit: Payer: Self-pay

## 2019-10-14 ENCOUNTER — Ambulatory Visit (INDEPENDENT_AMBULATORY_CARE_PROVIDER_SITE_OTHER): Payer: Medicare Other | Admitting: Family Medicine

## 2019-10-14 VITALS — BP 126/73 | HR 89 | Temp 95.7°F | Ht 69.0 in | Wt >= 6400 oz

## 2019-10-14 DIAGNOSIS — G8929 Other chronic pain: Secondary | ICD-10-CM

## 2019-10-14 DIAGNOSIS — F339 Major depressive disorder, recurrent, unspecified: Secondary | ICD-10-CM

## 2019-10-14 DIAGNOSIS — K760 Fatty (change of) liver, not elsewhere classified: Secondary | ICD-10-CM

## 2019-10-14 DIAGNOSIS — R609 Edema, unspecified: Secondary | ICD-10-CM | POA: Diagnosis not present

## 2019-10-14 DIAGNOSIS — M545 Low back pain: Secondary | ICD-10-CM

## 2019-10-14 DIAGNOSIS — E119 Type 2 diabetes mellitus without complications: Secondary | ICD-10-CM

## 2019-10-14 DIAGNOSIS — Z79899 Other long term (current) drug therapy: Secondary | ICD-10-CM

## 2019-10-14 DIAGNOSIS — K469 Unspecified abdominal hernia without obstruction or gangrene: Secondary | ICD-10-CM

## 2019-10-14 LAB — BAYER DCA HB A1C WAIVED: HB A1C (BAYER DCA - WAIVED): 6.4 % (ref <7.0)

## 2019-10-14 MED ORDER — FUROSEMIDE 20 MG PO TABS: 20.0000 mg | ORAL_TABLET | Freq: Every day | ORAL | 1 refills | Status: DC

## 2019-10-14 MED ORDER — ACETAMINOPHEN-CODEINE #3 300-30 MG PO TABS: ORAL_TABLET | ORAL | 2 refills | Status: DC

## 2019-10-14 MED ORDER — DICLOFENAC SODIUM 50 MG PO TBEC: 50.0000 mg | DELAYED_RELEASE_TABLET | Freq: Two times a day (BID) | ORAL | 2 refills | Status: DC

## 2019-10-14 MED ORDER — ESCITALOPRAM OXALATE 10 MG PO TABS: 10.0000 mg | ORAL_TABLET | Freq: Every day | ORAL | 1 refills | Status: DC

## 2019-10-14 NOTE — Patient Instructions (Addendum)
A1c 6.4 today  Weight today 434 lbs  MyChart Username: [email or web address] Password: [email or web address]  Semaglutide injection solution What is this medicine? SEMAGLUTIDE (Sem a GLOO tide) is used to improve blood sugar control in adults with type 2 diabetes. This medicine may be used with other diabetes medicines. This drug may also reduce the risk of heart attack or stroke if you have type 2 diabetes and risk factors for heart disease. This medicine may be used for other purposes; ask your health care provider or pharmacist if you have questions. COMMON BRAND NAME(S): OZEMPIC What should I tell my health care provider before I take this medicine? They need to know if you have any of these conditions:  endocrine tumors (MEN 2) or if someone in your family had these tumors  eye disease, vision problems  history of pancreatitis  kidney disease  stomach problems  thyroid cancer or if someone in your family had thyroid cancer  an unusual or allergic reaction to semaglutide, other medicines, foods, dyes, or preservatives  pregnant or trying to get pregnant  breast-feeding How should I use this medicine? This medicine is for injection under the skin of your upper leg (thigh), stomach area, or upper arm. It is given once every week (every 7 days). You will be taught how to prepare and give this medicine. Use exactly as directed. Take your medicine at regular intervals. Do not take it more often than directed. If you use this medicine with insulin, you should inject this medicine and the insulin separately. Do not mix them together. Do not give the injections right next to each other. Change (rotate) injection sites with each injection. It is important that you put your used needles and syringes in a special sharps container. Do not put them in a trash can. If you do not have a sharps container, call your pharmacist or healthcare provider to get one. A special MedGuide will be given to you by the  pharmacist with each prescription and refill. Be sure to read this information carefully each time. This drug comes with INSTRUCTIONS FOR USE. Ask your pharmacist for directions on how to use this drug. Read the information carefully. Talk to your pharmacist or health care provider if you have questions. Talk to your pediatrician regarding the use of this medicine in children. Special care may be needed. Overdosage: If you think you have taken too much of this medicine contact a poison control center or emergency room at once. NOTE: This medicine is only for you. Do not share this medicine with others. What if I miss a dose? If you miss a dose, take it as soon as you can within 5 days after the missed dose. Then take your next dose at your regular weekly time. If it has been longer than 5 days after the missed dose, do not take the missed dose. Take the next dose at your regular time. Do not take double or extra doses. If you have questions about a missed dose, contact your health care provider for advice. What may interact with this medicine?  other medicines for diabetes Many medications may cause changes in blood sugar, these include:  alcohol containing beverages  antiviral medicines for HIV or AIDS  aspirin and aspirin-like drugs  certain medicines for blood pressure, heart disease, irregular heart beat  chromium  diuretics  male hormones, such as estrogens or progestins, birth control pills  fenofibrate  gemfibrozil  isoniazid  lanreotide  male hormones or anabolic steroids  MAOIs  like Carbex, Eldepryl, Marplan, Nardil, and Parnate  medicines for weight loss  medicines for allergies, asthma, cold, or cough  medicines for depression, anxiety, or psychotic disturbances  niacin  nicotine  NSAIDs, medicines for pain and inflammation, like ibuprofen or naproxen  octreotide  pasireotide  pentamidine  phenytoin  probenecid  quinolone antibiotics such as  ciprofloxacin, levofloxacin, ofloxacin  some herbal dietary supplements  steroid medicines such as prednisone or cortisone  sulfamethoxazole; trimethoprim  thyroid hormones Some medications can hide the warning symptoms of low blood sugar (hypoglycemia). You may need to monitor your blood sugar more closely if you are taking one of these medications. These include:  beta-blockers, often used for high blood pressure or heart problems (examples include atenolol, metoprolol, propranolol)  clonidine  guanethidine  reserpine This list may not describe all possible interactions. Give your health care provider a list of all the medicines, herbs, non-prescription drugs, or dietary supplements you use. Also tell them if you smoke, drink alcohol, or use illegal drugs. Some items may interact with your medicine. What should I watch for while using this medicine? Visit your doctor or health care professional for regular checks on your progress. Drink plenty of fluids while taking this medicine. Check with your doctor or health care professional if you get an attack of severe diarrhea, nausea, and vomiting. The loss of too much body fluid can make it dangerous for you to take this medicine. A test called the HbA1C (A1C) will be monitored. This is a simple blood test. It measures your blood sugar control over the last 2 to 3 months. You will receive this test every 3 to 6 months. Learn how to check your blood sugar. Learn the symptoms of low and high blood sugar and how to manage them. Always carry a quick-source of sugar with you in case you have symptoms of low blood sugar. Examples include hard sugar candy or glucose tablets. Make sure others know that you can choke if you eat or drink when you develop serious symptoms of low blood sugar, such as seizures or unconsciousness. They must get medical help at once. Tell your doctor or health care professional if you have high blood sugar. You might need to  change the dose of your medicine. If you are sick or exercising more than usual, you might need to change the dose of your medicine. Do not skip meals. Ask your doctor or health care professional if you should avoid alcohol. Many nonprescription cough and cold products contain sugar or alcohol. These can affect blood sugar. Pens should never be shared. Even if the needle is changed, sharing may result in passing of viruses like hepatitis or HIV. Wear a medical ID bracelet or chain, and carry a card that describes your disease and details of your medicine and dosage times. Do not become pregnant while taking this medicine. Women should inform their doctor if they wish to become pregnant or think they might be pregnant. There is a potential for serious side effects to an unborn child. Talk to your health care professional or pharmacist for more information. What side effects may I notice from receiving this medicine? Side effects that you should report to your doctor or health care professional as soon as possible:  allergic reactions like skin rash, itching or hives, swelling of the face, lips, or tongue  breathing problems  changes in vision  diarrhea that continues or is severe  lump or swelling on the neck  severe nausea  signs and symptoms of infection like fever or chills; cough; sore throat; pain or trouble passing urine  signs and symptoms of low blood sugar such as feeling anxious, confusion, dizziness, increased hunger, unusually weak or tired, sweating, shakiness, cold, irritable, headache, blurred vision, fast heartbeat, loss of consciousness  signs and symptoms of kidney injury like trouble passing urine or change in the amount of urine  trouble swallowing  unusual stomach upset or pain  vomiting Side effects that usually do not require medical attention (report to your doctor or health care professional if they continue or are  bothersome):  constipation  diarrhea  nausea  pain, redness, or irritation at site where injected  stomach upset This list may not describe all possible side effects. Call your doctor for medical advice about side effects. You may report side effects to FDA at 1-800-FDA-1088. Where should I keep my medicine? Keep out of the reach of children. Store unopened pens in a refrigerator between 2 and 8 degrees C (36 and 46 degrees F). Do not freeze. Protect from light and heat. After you first use the pen, it can be stored for 56 days at room temperature between 15 and 30 degrees C (59 and 86 degrees F) or in a refrigerator. Throw away your used pen after 56 days or after the expiration date, whichever comes first. Do not store your pen with the needle attached. If the needle is left on, medicine may leak from the pen. NOTE: This sheet is a summary. It may not cover all possible information. If you have questions about this medicine, talk to your doctor, pharmacist, or health care provider.  2020 Elsevier/Gold Standard (2018-12-28 09:41:51)

## 2019-10-14 NOTE — Progress Notes (Signed)
Assessment & Plan:  1. Type 2 diabetes mellitus without complication, without long-term current use of insulin (HCC) -  Lab Results  Component Value Date   HGBA1C 6.4 10/14/2019   HGBA1C 6.7 07/14/2019   HGBA1C 6.9 04/04/2019  - Diabetes is at goal of A1c < 7. - Medications: not currently on medications - Patient is not currently taking a statin. Patient is not taking an ACE-inhibitor/ARB.  - Last foot exam: 04/20/2019 - Last diabetic eye exam: recent per patient - record requested from [facility] - Urine Microalbumin/Creat Ratio: 04/20/2019 - Instruction/counseling given: discussed the need for weight loss - Bayer DCA Hb A1c Waived - CMP14+EGFR  2. Chronic midline low back pain without sciatica - Well controlled on current regimen.  - acetaminophen-codeine (TYLENOL #3) 300-30 MG tablet; TAKE 1 TABLET BY MOUTH AT BEDTIME AS NEEDED FOR MODERATE PAIN.  Dispense: 30 tablet; Refill: 2 - diclofenac (VOLTAREN) 50 MG EC tablet; Take 1 tablet (50 mg total) by mouth 2 (two) times daily.  Dispense: 180 tablet; Refill: 2 - CMP14+EGFR  3. Controlled substance agreement signed - Signed for Tylenol #3. UDS as expected on 04/20/2019. PDMP reviewed and no concerning findings.   4. Depression, recurrent ([place]) - Encouraged to take on a daily basis and explained how medications works. He will start taking daily.  - escitalopram (LEXAPRO) 10 MG tablet; Take 1 tablet (10 mg total) by mouth daily.  Dispense: 90 tablet; Refill: 1 - CMP14+EGFR  5. Swelling - Well controlled on current regimen.  - furosemide (LASIX) 20 MG tablet; Take 1 tablet (20 mg total) by mouth daily.  Dispense: 90 tablet; Refill: 1 - CMP14+EGFR  6. Fatty liver - CMP14+EGFR  7. Morbid obesity ([place]) - Discussed benefits of adding Ozempic for diabetes to help with weight loss. Education provided on Ozempic. Patient would like to think about it first. He will continue diet and exercise. Encouraged to schedule  appointment next week with Almyra Free to discuss Ozempic further.   8. Non-recurrent abdominal hernia without obstruction or gangrene, unspecified hernia type - Discussed possible referral; patient does not wish to do this at this time. He will let me know if he changes his mind.    Return for with Almyra Free next week for weight/Ozempic; me in 3 months pain/weight.  [name], MSN, APRN, FNP-C [facility]  Subjective:    Patient ID: Justin Schmidt, male    DOB: May 18, 1980, 39 y.o.   MRN: [id number]  Patient Care Team: Loman Brooklyn, FNP as PCP - General (Family Medicine) Ilean China, RN as Registered Nurse   Chief Complaint:  Chief Complaint  Patient presents with   Establish Care    rakes   Diabetes    check up of chronic medical conditions    HPI: Justin Schmidt is a 39 y.o. male presenting on 10/14/2019 for Establish Care (rakes) and Diabetes (check up of chronic medical conditions)  Diabetes: Patient presents for follow up of diabetes. Current symptoms include: none. Known diabetic complications: none. Medication compliance: not on medications at this time. Current diet: in general, a "healthy" diet  . Current exercise: walking, weightlifting and karate. Is he  on ACE inhibitor or angiotensin II receptor blocker? No. Is he on a statin? No.   Lab Results  Component Value Date   HGBA1C 6.4 10/14/2019   HGBA1C 6.7 07/14/2019   HGBA1C 6.9 04/04/2019   Lab Results  Component Value Date   LDLCALC 87 07/14/2019  CREATININE 1.18 07/14/2019     Patient takes Tylenol #3 once daily as needed at bedtime for chronic pain/knee pain. He takes diclofenac during the day.   He has a prescription for Lexapro but only takes it as needed.  Depression screen [facility] 2/9 10/14/2019 07/14/2019 04/20/2019  Decreased Interest 1 0 0  Down, Depressed, Hopeless 2 1 0  PHQ - 2 Score 3 1 0  Altered sleeping 2 - -  Tired, decreased energy 1 - -  Change in appetite 1 - -    Feeling bad or failure about yourself  1 - -  Trouble concentrating 0 - -  Moving slowly or fidgety/restless 0 - -  Suicidal thoughts 1 - -  PHQ-9 Score 9 - -  Difficult doing work/chores - - -  Some recent data might be hidden   GAD 7 : Generalized Anxiety Score 10/14/2019 04/04/2019 11/03/2017 09/11/2016  Nervous, Anxious, on Edge '1 2 1 1  ' Control/stop worrying '1 1 1 2  ' Worry too much - different things 2 2 0 2  Trouble relaxing 1 1 0 2  Restless 0 0 0 0  Easily annoyed or irritable 1 2 0 1  Afraid - awful might happen 0 0 0 0  Total GAD 7 Score '6 8 2 8  ' Anxiety Difficulty - Somewhat difficult Not difficult at all Somewhat difficult   He takes furosemide for lower extremity edema.   He has been trying really hard to take care of himself again by eating right and exercising. He admits he was not taking good care of himself over the past couple of years because he was caring for sick family members.   Patient is concerned about a bulge he has noticed on his abdomen since he has started to lose weight. 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 He reports he had to lift her as well and she was dead weight, which is quite different than doing karate or lifting weights.    Social history:  Relevant past medical, surgical, family and social history reviewed and updated as indicated. Interim medical history since our last visit reviewed.  Allergies and medications reviewed and updated.  DATA REVIEWED: CHART IN EPIC  ROS: Negative unless specifically indicated above in HPI.    Current Outpatient Medications:    acetaminophen-codeine (TYLENOL #3) 300-30 MG tablet, TAKE 1 TABLET BY MOUTH AT BEDTIME AS NEEDED FOR MODERATE PAIN., Disp: 30 tablet, Rfl: 0   Ascorbic Acid (VITAMIN C) 1000 MG tablet, Take 1,000 mg by mouth daily., Disp: , Rfl:    aspirin EC 81 MG tablet, Take 81 mg by mouth daily., Disp: , Rfl:     Cholecalciferol (VITAMIN D) 2000 UNITS tablet, Take 2,000 Units by mouth daily., Disp: , Rfl:    diclofenac (VOLTAREN) 50 MG EC tablet, Take 50 mg by mouth 2 (two) times daily., Disp: , Rfl:    escitalopram (LEXAPRO) 10 MG tablet, TAKE 1 TABLET BY MOUTH EVERY DAY, Disp: 90 tablet, Rfl: 0   fluticasone (FLONASE) 50 MCG/ACT nasal spray, USE 2 SPRAYS IN EACH NOSTRIL EVERY DAY, Disp: 48 g, Rfl: 4   furosemide (LASIX) 20 MG tablet, Take 1 tablet (20 mg total) by mouth daily., Disp: 90 tablet, Rfl: 0   Multiple Vitamin (MULTIVITAMIN WITH MINERALS) TABS, Take 1 tablet by mouth daily., Disp: , Rfl:    OVER THE COUNTER MEDICATION, zinc, Disp: ,  Rfl:    OVER THE COUNTER MEDICATION, Flaxseed oil, Disp: , Rfl:  No current facility-administered medications for this visit.  Facility-Administered Medications Ordered in Other Visits:    sodium chloride irrigation 0.9 %, , , PRN, Chelsea Primus, MD, 3,000 mL at 01/27/12 0739   No Known Allergies Past Medical History:  Diagnosis Date   Acute saddle pulmonary embolism without acute cor pulmonale ([place]) 01/30/2017   Back pain    Gallstones    Hyperlipidemia    Pre-diabetes    VTE (venous thromboembolism) 05/14/2017    Past Surgical History:  Procedure Laterality Date   APPENDECTOMY  1991   CHOLECYSTECTOMY  01/23/2012   Procedure: LAPAROSCOPIC CHOLECYSTECTOMY;  Surgeon: Donato Heinz, MD;  Location: [facility];  Service: General;  Laterality: N/A;   TONSILLECTOMY      Social History   Socioeconomic History   Marital status: Single    Spouse name: Not on file   Number of children: Not on file   Years of education: 12th grade   Highest education level: High school graduate  Occupational History   Not on file  Tobacco Use   Smoking status: Never Smoker   Smokeless tobacco: Never Used  Vaping Use   Vaping Use: Never used  Substance and Sexual Activity   Alcohol use: Yes    Alcohol/week: 1.0 standard drink    Types: 1 Cans  of beer per week    Comment: very rare   Drug use: No   Sexual activity: Never  Other Topics Concern   Not on file  Social History Narrative   Not on file   Social Determinants of Health   Financial Resource Strain:    Difficulty of Paying Living Expenses:   Food Insecurity:    Worried About Charity fundraiser in the Last Year:    Arboriculturist in the Last Year:   Transportation Needs:    Film/video editor (Medical):    Lack of Transportation (Non-Medical):   Physical Activity:    Days of Exercise per Week:    Minutes of Exercise per Session:   Stress:    Feeling of Stress :   Social Connections:    Frequency of Communication with Friends and Family:    Frequency of Social Gatherings with Friends and Family:    Attends Religious Services:    Active Member of Clubs or Organizations:    Attends Archivist Meetings:    Marital Status:   Intimate Partner Violence:    Fear of Current or Ex-Partner:    Emotionally Abused:    Physically Abused:    Sexually Abused:         Objective:    BP 126/73    Pulse 89    Temp (!) 95.7 F (35.4 C) (Temporal)    Ht '5\' 9"'  (1.753 m)    Wt (!) 434 lb 6.4 oz (197 kg)    SpO2 95%    BMI 64.15 kg/m   Wt Readings from Last 3 Encounters:  10/14/19 (!) 434 lb 6.4 oz (197 kg)  07/14/19 (!) 423 lb (191.9 kg)  04/20/19 (!) 435 lb (197.3 kg)    Physical Exam Vitals reviewed.  Constitutional:      General: He is not in acute distress.    Appearance: Normal appearance. He is morbidly obese. He is not ill-appearing, toxic-appearing or diaphoretic.  HENT:     Head: Normocephalic and atraumatic.  Eyes:     General: No  scleral icterus.       Right eye: No discharge.        Left eye: No discharge.     Conjunctiva/sclera: Conjunctivae normal.  Cardiovascular:     Rate and Rhythm: Normal rate and regular rhythm.     Heart sounds: Normal heart sounds. No murmur heard.  No friction rub. No gallop.     Pulmonary:     Effort: Pulmonary effort is normal. No respiratory distress.     Breath sounds: Normal breath sounds. No stridor. No wheezing, rhonchi or rales.  Abdominal:     Hernia: A hernia is present.    Musculoskeletal:        General: Normal range of motion.     Cervical back: Normal range of motion.  Skin:    General: Skin is warm and dry.  Neurological:     Mental Status: He is alert and oriented to person, place, and time. Mental status is at baseline.  Psychiatric:        Mood and Affect: Mood normal.        Behavior: Behavior normal.        Thought Content: Thought content normal.        Judgment: Judgment normal.     Lab Results  Component Value Date   TSH 1.540 08/22/2016   Lab Results  Component Value Date   WBC 6.1 07/14/2019   HGB 14.6 07/14/2019   HCT 42.6 07/14/2019   MCV 93 07/14/2019   PLT 258 07/14/2019   Lab Results  Component Value Date   NA 140 07/14/2019   K 4.4 07/14/2019   CO2 26 07/14/2019   GLUCOSE 111 (H) 07/14/2019   BUN 14 07/14/2019   CREATININE 1.18 07/14/2019   BILITOT 0.3 07/14/2019   ALKPHOS 72 07/14/2019   AST 31 07/14/2019   ALT 57 (H) 07/14/2019   PROT 7.0 07/14/2019   ALBUMIN 4.3 07/14/2019   CALCIUM 9.4 07/14/2019   ANIONGAP 6 08/12/2017   Lab Results  Component Value Date   CHOL 155 07/14/2019   Lab Results  Component Value Date   HDL 40 07/14/2019   Lab Results  Component Value Date   LDLCALC 87 07/14/2019   Lab Results  Component Value Date   TRIG 162 (H) 07/14/2019   Lab Results  Component Value Date   CHOLHDL 3.9 07/14/2019   Lab Results  Component Value Date   HGBA1C 6.7 07/14/2019

## 2019-10-15 LAB — CMP14+EGFR
ALT: 45 IU/L — ABNORMAL HIGH (ref 0–44)
AST: 27 IU/L (ref 0–40)
Albumin/Globulin Ratio: 1.8 (ref 1.2–2.2)
Albumin: 4.6 g/dL (ref 4.0–5.0)
Alkaline Phosphatase: 68 IU/L (ref 48–121)
BUN/Creatinine Ratio: 14 (ref 9–20)
BUN: 18 mg/dL (ref 6–20)
Bilirubin Total: 0.3 mg/dL (ref 0.0–1.2)
CO2: 25 mmol/L (ref 20–29)
Calcium: 9.6 mg/dL (ref 8.7–10.2)
Chloride: 100 mmol/L (ref 96–106)
Creatinine, Ser: 1.25 mg/dL (ref 0.76–1.27)
GFR calc Af Amer: 83 mL/min/{1.73_m2} (ref >59)
GFR calc non Af Amer: 72 mL/min/{1.73_m2} (ref >59)
Globulin, Total: 2.5 g/dL (ref 1.5–4.5)
Glucose: 109 mg/dL — ABNORMAL HIGH (ref 65–99)
Potassium: 3.8 mmol/L (ref 3.5–5.2)
Sodium: 142 mmol/L (ref 134–144)
Total Protein: 7.1 g/dL (ref 6.0–8.5)

## 2019-10-17 ENCOUNTER — Encounter: Payer: Self-pay | Admitting: Family Medicine

## 2019-10-21 ENCOUNTER — Encounter: Payer: Self-pay | Admitting: Pharmacist

## 2019-10-21 ENCOUNTER — Ambulatory Visit (INDEPENDENT_AMBULATORY_CARE_PROVIDER_SITE_OTHER): Payer: Medicare Other | Admitting: Pharmacist

## 2019-10-21 ENCOUNTER — Other Ambulatory Visit: Payer: Self-pay

## 2019-10-21 VITALS — Wt >= 6400 oz

## 2019-10-21 DIAGNOSIS — E119 Type 2 diabetes mellitus without complications: Secondary | ICD-10-CM

## 2019-10-21 NOTE — Progress Notes (Signed)
    10/21/2019 Name: Justin Schmidt MRN: [id number] DOB: 1981/04/02   S:  38 YOM Presents for diabetes evaluation, education, and management Patient was referred and last seen by Primary Care Provider on 10/14/19.  Insurance coverage/medication affordability: [facility] MEDICARE  Patient reports adherence with medications. . Current diabetes medications include: n/a ( per notes, he has tried and failed metformin) . Current hypertension medications include: n/a3 Goal 130/80 . Current hyperlipidemia medications include: n/a  LDL 87 ON 07/14/19   Patient denies hypoglycemic events.   Patient reported dietary habits: Eats 3 meals/day Chicken/tuna, broccoli  He has cut down on red meat gatorade zero sugar water  Patient-reported exercise habits: bike, walking  O:  Lab Results  Component Value Date   HGBA1C 6.4 10/14/2019    Weight: 435lb    Lipid Panel     Component Value Date/Time   CHOL 155 07/14/2019 1509   TRIG 162 (H) 07/14/2019 1509   HDL 40 07/14/2019 1509   CHOLHDL 3.9 07/14/2019 1509   LDLCALC 87 07/14/2019 1509    Home fasting blood sugars: 120, 115, 110, 90  2 hour post-meal/random blood sugars: N/A.     A/P:  Diabetes T2DM currently CONTROLLED. Patient is able to verbalize appropriate hypoglycemia management plan. Patient is adherent with medication.   -Discussed Ozempic in-depth and reviewed benefits of GLP1 (weight loss, cardiovascular and renal protective mechanisms)   -Ozempic/GLP1 Handout provided  -Patient does not wish to start this medication at this time  -Highly encouraged him to think about it and to call us if he is ready to start in the near future  -He is working to control diet and increase physical activity  -per notes, he has tried/failed metformin (updated intolerances)  -Message relayed to PCP to consider statin  -Extensively discussed pathophysiology of diabetes, recommended lifestyle interventions, dietary effects on blood sugar  control  -Counseled on s/sx of and management of hypoglycemia  -Next A1C anticipated 12/2019.    Written patient instructions provided.  Total time in face to face counseling 30 minutes.   Follow up PCP Clinic Visit in 3 months  Kieth Brightly, PharmD, BCPS Clinical Pharmacist, [facility]  II Phone [phone]

## 2019-11-01 DIAGNOSIS — R531 Weakness: Secondary | ICD-10-CM | POA: Diagnosis not present

## 2019-11-01 DIAGNOSIS — R600 Localized edema: Secondary | ICD-10-CM | POA: Diagnosis not present

## 2020-01-06 ENCOUNTER — Telehealth: Payer: Self-pay

## 2020-01-06 NOTE — Chronic Care Management (AMB) (Signed)
  Care Management   Note  01/06/2020 Name: EDILBERTO ROOSEVELT MRN: [id number] DOB: 1980/08/27  [name] is a 39 y.o. year old male who is a primary care patient of Gwenlyn Fudge, FNP and is actively engaged with the care management team. I reached out to [name] by phone today to assist with re-scheduling an initial visit with the RN Case Manager  Follow up plan: Unsuccessful telephone outreach attempt made. A HIPPA compliant phone message was left for the patient providing contact information and requesting a return call.  The care management team will reach out to the patient again over the next 7 days.  If patient returns call to provider office, please advise to call Embedded Care Management Care Guide Penne Lash  at [phone]  Penne Lash, RMA Care Guide, Embedded Care Coordination [facility]  [place], [place] [address] Direct Dial: [phone] [email or web address]

## 2020-01-06 NOTE — Chronic Care Management (AMB) (Signed)
  Care Management   Note  01/06/2020 Name: Justin Schmidt MRN: [id number] DOB: 1981-01-09  Justin Schmidt is a 39 y.o. year old male who is a primary care patient of Gwenlyn Fudge, FNP and is actively engaged with the care management team. I reached out to Justin Schmidt by phone today to assist with re-scheduling an initial visit with the RN Case Manager Licensed Clinical Social Worker  Follow up plan: Telephone appointment with care management team member scheduled for:02/08/2020  Penne Lash, RMA Care Guide, Embedded Care Coordination [facility]  [place], [place] [address] Direct Dial: [phone] [email or web address]: [email or web address]

## 2020-01-11 ENCOUNTER — Telehealth: Payer: Medicare Other

## 2020-01-17 ENCOUNTER — Encounter: Payer: Self-pay | Admitting: Family Medicine

## 2020-01-17 ENCOUNTER — Telehealth: Payer: Self-pay | Admitting: Family Medicine

## 2020-01-17 ENCOUNTER — Other Ambulatory Visit: Payer: Self-pay

## 2020-01-17 ENCOUNTER — Ambulatory Visit (INDEPENDENT_AMBULATORY_CARE_PROVIDER_SITE_OTHER): Payer: Medicare Other | Admitting: Family Medicine

## 2020-01-17 VITALS — BP 130/75 | HR 83 | Temp 96.3°F | Ht 69.0 in | Wt >= 6400 oz

## 2020-01-17 DIAGNOSIS — E119 Type 2 diabetes mellitus without complications: Secondary | ICD-10-CM

## 2020-01-17 DIAGNOSIS — M25562 Pain in left knee: Secondary | ICD-10-CM | POA: Diagnosis not present

## 2020-01-17 DIAGNOSIS — Z79899 Other long term (current) drug therapy: Secondary | ICD-10-CM

## 2020-01-17 DIAGNOSIS — M545 Low back pain: Secondary | ICD-10-CM

## 2020-01-17 DIAGNOSIS — G8929 Other chronic pain: Secondary | ICD-10-CM

## 2020-01-17 LAB — BAYER DCA HB A1C WAIVED: HB A1C (BAYER DCA - WAIVED): 6.2 % (ref <7.0)

## 2020-01-17 NOTE — Telephone Encounter (Signed)
Informed pt that he would probably need labs. Pt has not had anything to eat. Instructed him if he was feeling bad to go ahead and eat. That we could have him come back in one am to re check. Otherwise, bring a snack with him to the appt and go to lab when he arrives.  Made Britney's nurse aware.

## 2020-01-17 NOTE — Progress Notes (Signed)
Assessment & Plan:  1. Type 2 diabetes mellitus without complication, without long-term current use of insulin (HCC) Lab Results  Component Value Date   HGBA1C 6.2 01/17/2020   HGBA1C 6.4 10/14/2019   HGBA1C 6.7 07/14/2019  - Diabetes is at goal of A1c < 7. - Medications: not currently on medication - Patient is not currently taking a statin. Patient is not taking an ACE-inhibitor/ARB. We discussed both medications today and he declines them both. - Last foot exam: 04/20/2019 - Last diabetic eye exam: within the past year per patient - requesting record again from [facility] - Urine Microalbumin/Creat Ratio: 04/20/2019 - Instruction/counseling given: discussed the need for weight loss and discussed diet - Bayer DCA Hb A1c Waived - CMP14+EGFR - Lipid panel  2. Morbid obesity ([place]) - Patient to continue diet and exercise. Patient was encouraged to start Ozempic for diabetes to help with weight loss previously but he declines. He has gained 3 lbs since our last visit.  - CMP14+EGFR - Lipid panel  3. Chronic midline low back pain without sciatica - Well controlled on current regimen. PDMP reviewed with no concerning findings. Controlled substance agreement and urine drug screen are up-to-date.  - CMP14+EGFR - acetaminophen-codeine (TYLENOL #3) 300-30 MG tablet; TAKE 1 TABLET BY MOUTH AT BEDTIME AS NEEDED FOR MODERATE PAIN.  Dispense: 30 tablet; Refill: 2  4. Chronic pain of left knee - For home use only DME Other see comment - CMP14+EGFR - acetaminophen-codeine (TYLENOL #3) 300-30 MG tablet; TAKE 1 TABLET BY MOUTH AT BEDTIME AS NEEDED FOR MODERATE PAIN.  Dispense: 30 tablet; Refill: 2  5. Controlled substance agreement signed - In place for Tylenol #3.    Return in about 3 months (around 04/17/2020) for pain contract.  [name], MSN, APRN, FNP-C [facility]  Subjective:    Patient ID: Justin Schmidt, male    DOB: 01-06-1981, 39 y.o.    MRN: [id number]  Patient Care Team: Loman Brooklyn, FNP as PCP - General (Family Medicine) Ilean China, RN as Registered Nurse Blanca Friend, Royce Macadamia, [facility] (Pharmacist)   Chief Complaint:  Chief Complaint  Patient presents with  . Diabetes    check up of chronic medical conditions  . Knee Pain    Patient states he has been having right knee pain for a few months and will pop.    HPI: Justin Schmidt is a 39 y.o. male presenting on 01/17/2020 for Diabetes (check up of chronic medical conditions) and Knee Pain (Patient states he has been having right knee pain for a few months and will pop.)  Diabetes: Patient presents for follow up of diabetes. Current symptoms include: none. Known diabetic complications: none. Medication compliance: not on medication at this time. Current diet: in general, a "healthy" diet  . Current exercise: walking.  Is he  on ACE inhibitor or angiotensin II receptor blocker? No, declined. Is he on a statin? No, declined.   Lab Results  Component Value Date   HGBA1C 6.2 01/17/2020   HGBA1C 6.4 10/14/2019   HGBA1C 6.7 07/14/2019   Lab Results  Component Value Date   LDLCALC 103 (H) 01/17/2020   CREATININE 1.12 01/17/2020     Pain assessment: Cause of pain- arthritis and morbid obesity Pain location- low back and left knee Pain on scale of 1-10- 6-8/10 Frequency- daily What increases pain- activity What makes pain better- rest, medication Effects on ADL- limits at times Any change in general medical condition-  none  Current opioids rx- Tylenol #3 QHS PRN # meds rx- 30 Effectiveness of current meds- good  Adverse reactions from pain meds- none Morphine equivalent- 4.50 MME/day  Pill count performed-No Last drug screen - 04/20/2019 ( high risk q23m moderate risk q671mlow risk yearly ) Urine drug screen today- No Was the NCAccordeviewed- Yes  If yes were their any concerning findings? - No  Overdose risk: 140  Opioid Risk  04/20/2019  Alcohol 0   Illegal Drugs 0  Rx Drugs 0  Alcohol 0  Illegal Drugs 0  Rx Drugs 0  Age between 1651-45ears  1  History of Preadolescent Sexual Abuse 0  Psychological Disease 0  Depression 1  Opioid Risk Tool Scoring 2  Opioid Risk Interpretation Low Risk   Pain contract signed on: 10/14/2019  [facility] 01/17[facility]2020 10/14/2019 07/14/2019  Decreased Interest 1 1 0  Down, Depressed, Hopeless '1 2 1  ' PHQ - 2 Score '2 3 1  ' Altered sleeping 0 2 -  Tired, decreased energy 0 1 -  Change in appetite 1 1 -  Feeling bad or failure about yourself  1 1 -  Trouble concentrating 0 0 -  Moving slowly or fidgety/restless 0 0 -  Suicidal thoughts 0 1 -  PHQ-9 Score 4 9 -  Difficult doing work/chores - - -  Some recent data might be hidden    New complaints: Patient is requesting a prescription for a knee brace as he has been unable to find one OTC that fits.    Social history:  Relevant past medical, surgical, family and social history reviewed and updated as indicated. Interim medical history since our last visit reviewed.  Allergies and medications reviewed and updated.  DATA REVIEWED: CHART IN EPIC  ROS: Negative unless specifically indicated above in HPI.    Current Outpatient Medications:  .  acetaminophen-codeine (TYLENOL #3) 300-30 MG tablet, TAKE 1 TABLET BY MOUTH AT BEDTIME AS NEEDED FOR MODERATE PAIN., Disp: 30 tablet, Rfl: 2 .  Ascorbic Acid (VITAMIN C) 1000 MG tablet, Take 1,000 mg by mouth daily., Disp: , Rfl:  .  aspirin EC 81 MG tablet, Take 162 mg by mouth daily. , Disp: , Rfl:  .  Cholecalciferol (VITAMIN D) 2000 UNITS tablet, Take 2,000 Units by mouth daily., Disp: , Rfl:  .  diclofenac (VOLTAREN) 50 MG EC tablet, Take 1 tablet (50 mg total) by mouth 2 (two) times daily., Disp: 180 tablet, Rfl: 2 .  escitalopram (LEXAPRO) 10 MG tablet, Take 1 tablet (10 mg total) by mouth daily., Disp: 90 tablet, Rfl: 1 .  fluticasone (FLONASE) 50 MCG/ACT nasal spray, USE 2 SPRAYS IN  EACH NOSTRIL EVERY DAY, Disp: 48 g, Rfl: 4 .  furosemide (LASIX) 20 MG tablet, Take 1 tablet (20 mg total) by mouth daily. (Patient taking differently: Take 40 mg by mouth daily. ), Disp: 90 tablet, Rfl: 1 .  Multiple Vitamin (MULTIVITAMIN WITH MINERALS) TABS, Take 1 tablet by mouth daily., Disp: , Rfl:  .  OVER THE COUNTER MEDICATION, zinc, Disp: , Rfl:  .  OVER THE COUNTER MEDICATION, Flaxseed oil, Disp: , Rfl:  No current facility-administered medications for this visit.  Facility-Administered Medications Ordered in Other Visits:  .  sodium chloride irrigation 0.9 %, , , PRN, ZiChelsea PrimusMD, 3,000 mL at 01/27/12 0739   Allergies  Allergen Reactions  . Metformin And Related     diarrhea  . Xarelto [Rivaroxaban]     DROWSINESS,  FATIGUE   Past Medical History:  Diagnosis Date  . Acute saddle pulmonary embolism without acute cor pulmonale ([place]) 01/30/2017  . Back pain   . Gallstones   . Hyperlipidemia   . Type 2 diabetes mellitus without complication, without long-term current use of insulin ([place]) 08/11/2018  . VTE (venous thromboembolism) 05/14/2017    Past Surgical History:  Procedure Laterality Date  . APPENDECTOMY  1991  . CHOLECYSTECTOMY  01/23/2012   Procedure: LAPAROSCOPIC CHOLECYSTECTOMY;  Surgeon: Donato Heinz, MD;  Location: [facility];  Service: General;  Laterality: N/A;  . TONSILLECTOMY      Social History   Socioeconomic History  . Marital status: Single    Spouse name: Not on file  . Number of children: Not on file  . Years of education: 12th grade  . Highest education level: High school graduate  Occupational History  . Not on file  Tobacco Use  . Smoking status: Never Smoker  . Smokeless tobacco: Never Used  Vaping Use  . Vaping Use: Never used  Substance and Sexual Activity  . Alcohol use: Yes    Alcohol/week: 1.0 standard drink    Types: 1 Cans of beer per week    Comment: very rare  . Drug use: No  . Sexual activity: Never  Other Topics Concern   . Not on file  Social History Narrative  . Not on file   Social Determinants of Health   Financial Resource Strain:   . Difficulty of Paying Living Expenses: Not on file  Food Insecurity:   . Worried About Charity fundraiser in the Last Year: Not on file  . Ran Out of Food in the Last Year: Not on file  Transportation Needs:   . Lack of Transportation (Medical): Not on file  . Lack of Transportation (Non-Medical): Not on file  Physical Activity:   . Days of Exercise per Week: Not on file  . Minutes of Exercise per Session: Not on file  Stress:   . Feeling of Stress : Not on file  Social Connections:   . Frequency of Communication with Friends and Family: Not on file  . Frequency of Social Gatherings with Friends and Family: Not on file  . Attends Religious Services: Not on file  . Active Member of Clubs or Organizations: Not on file  . Attends Archivist Meetings: Not on file  . Marital Status: Not on file  Intimate Partner Violence:   . Fear of Current or Ex-Partner: Not on file  . Emotionally Abused: Not on file  . Physically Abused: Not on file  . Sexually Abused: Not on file        Objective:    BP 130/75   Pulse 83   Temp (!) 96.3 F (35.7 C) (Temporal)   Ht '5\' 9"'  (1.753 m)   Wt (!) 437 lb 6.4 oz (198.4 kg)   SpO2 97%   BMI 64.59 kg/m   Wt Readings from Last 3 Encounters:  01/17/20 (!) 437 lb 6.4 oz (198.4 kg)  10/21/19 (!) 435 lb 6.4 oz (197.5 kg)  10/14/19 (!) 434 lb 6.4 oz (197 kg)    Physical Exam Vitals reviewed.  Constitutional:      General: He is not in acute distress.    Appearance: Normal appearance. He is morbidly obese. He is not ill-appearing, toxic-appearing or diaphoretic.  HENT:     Head: Normocephalic and atraumatic.  Eyes:     General: No scleral icterus.  Right eye: No discharge.        Left eye: No discharge.     Conjunctiva/sclera: Conjunctivae normal.  Cardiovascular:     Rate and Rhythm: Normal rate and  regular rhythm.     Heart sounds: Normal heart sounds. No murmur heard.  No friction rub. No gallop.   Pulmonary:     Effort: Pulmonary effort is normal. No respiratory distress.     Breath sounds: Normal breath sounds. No stridor. No wheezing, rhonchi or rales.  Musculoskeletal:        General: Normal range of motion.     Cervical back: Normal range of motion.  Skin:    General: Skin is warm and dry.  Neurological:     Mental Status: He is alert and oriented to person, place, and time. Mental status is at baseline.     Gait: Gait abnormal.  Psychiatric:        Mood and Affect: Mood normal.        Behavior: Behavior normal.        Thought Content: Thought content normal.        Judgment: Judgment normal.    Lab Results  Component Value Date   TSH 1.540 08/22/2016   Lab Results  Component Value Date   WBC 6.1 07/14/2019   HGB 14.6 07/14/2019   HCT 42.6 07/14/2019   MCV 93 07/14/2019   PLT 258 07/14/2019   Lab Results  Component Value Date   NA 142 10/14/2019   K 3.8 10/14/2019   CO2 25 10/14/2019   GLUCOSE 109 (H) 10/14/2019   BUN 18 10/14/2019   CREATININE 1.25 10/14/2019   BILITOT 0.3 10/14/2019   ALKPHOS 68 10/14/2019   AST 27 10/14/2019   ALT 45 (H) 10/14/2019   PROT 7.1 10/14/2019   ALBUMIN 4.6 10/14/2019   CALCIUM 9.6 10/14/2019   ANIONGAP 6 08/12/2017   Lab Results  Component Value Date   CHOL 155 07/14/2019   Lab Results  Component Value Date   HDL 40 07/14/2019   Lab Results  Component Value Date   LDLCALC 87 07/14/2019   Lab Results  Component Value Date   TRIG 162 (H) 07/14/2019   Lab Results  Component Value Date   CHOLHDL 3.9 07/14/2019   Lab Results  Component Value Date   HGBA1C 6.4 10/14/2019

## 2020-01-18 LAB — CMP14+EGFR
ALT: 60 IU/L — ABNORMAL HIGH (ref 0–44)
AST: 32 IU/L (ref 0–40)
Albumin/Globulin Ratio: 1.8 (ref 1.2–2.2)
Albumin: 4.4 g/dL (ref 4.0–5.0)
Alkaline Phosphatase: 62 IU/L (ref 44–121)
BUN/Creatinine Ratio: 12 (ref 9–20)
BUN: 13 mg/dL (ref 6–20)
Bilirubin Total: 0.3 mg/dL (ref 0.0–1.2)
CO2: 25 mmol/L (ref 20–29)
Calcium: 9.9 mg/dL (ref 8.7–10.2)
Chloride: 103 mmol/L (ref 96–106)
Creatinine, Ser: 1.12 mg/dL (ref 0.76–1.27)
GFR calc Af Amer: 95 mL/min/{1.73_m2} (ref >59)
GFR calc non Af Amer: 82 mL/min/{1.73_m2} (ref >59)
Globulin, Total: 2.4 g/dL (ref 1.5–4.5)
Glucose: 101 mg/dL — ABNORMAL HIGH (ref 65–99)
Potassium: 4.6 mmol/L (ref 3.5–5.2)
Sodium: 142 mmol/L (ref 134–144)
Total Protein: 6.8 g/dL (ref 6.0–8.5)

## 2020-01-18 LAB — LIPID PANEL
Chol/HDL Ratio: 4 ratio (ref 0.0–5.0)
Cholesterol, Total: 161 mg/dL (ref 100–199)
HDL: 40 mg/dL (ref >39)
LDL Chol Calc (NIH): 103 mg/dL — ABNORMAL HIGH (ref 0–99)
Triglycerides: 96 mg/dL (ref 0–149)
VLDL Cholesterol Cal: 18 mg/dL (ref 5–40)

## 2020-01-23 ENCOUNTER — Encounter: Payer: Self-pay | Admitting: Family Medicine

## 2020-01-23 MED ORDER — ACETAMINOPHEN-CODEINE #3 300-30 MG PO TABS: ORAL_TABLET | ORAL | 2 refills | Status: DC

## 2020-01-24 NOTE — Progress Notes (Signed)
RR filled out 

## 2020-02-08 ENCOUNTER — Ambulatory Visit (INDEPENDENT_AMBULATORY_CARE_PROVIDER_SITE_OTHER): Payer: Medicare Other | Admitting: Licensed Clinical Social Worker

## 2020-02-08 DIAGNOSIS — R0609 Other forms of dyspnea: Secondary | ICD-10-CM

## 2020-02-08 DIAGNOSIS — E119 Type 2 diabetes mellitus without complications: Secondary | ICD-10-CM

## 2020-02-08 DIAGNOSIS — Z86718 Personal history of other venous thrombosis and embolism: Secondary | ICD-10-CM

## 2020-02-08 DIAGNOSIS — Z86711 Personal history of pulmonary embolism: Secondary | ICD-10-CM

## 2020-02-08 DIAGNOSIS — F339 Major depressive disorder, recurrent, unspecified: Secondary | ICD-10-CM | POA: Diagnosis not present

## 2020-02-08 DIAGNOSIS — E8881 Metabolic syndrome: Secondary | ICD-10-CM

## 2020-02-08 NOTE — Chronic Care Management (AMB) (Signed)
Chronic Care Management    Clinical Social Work Follow Up Note  02/08/2020 Name: Justin Schmidt MRN: [id number] DOB: Jan 31, 1981  [name] is a 39 y.o. year old male who is a primary care patient of Gwenlyn Fudge, FNP. The CCM team was consulted for assistance with [employer] .   Review of patient status, including review of consultants reports, other relevant assessments, and collaboration with appropriate care team members and the patient's provider was performed as part of comprehensive patient evaluation and provision of chronic care management services.    SDOH (Social Determinants of Health) assessments performed: Yes;risk for depression; risk for stress; risk for tobacco use; risk for physical inactivity; risk for social isolation  SDOH Interventions     Most Recent Value  SDOH Interventions  Depression Interventions/Treatment  Medication        Chronic Care Management from 02/08/2020 in [facility]  PHQ-9 Total Score 4     GAD 7 : Generalized Anxiety Score 02/08/2020 01/17/2020 10/14/2019 04/04/2019  Nervous, Anxious, on Edge 1 1 1 2   Control/stop worrying 0 2 1 1   Worry too much - different things 0 2 2 2   Trouble relaxing 0 1 1 1   Restless 0 0 0 0  Easily annoyed or irritable 0 3 1 2   Afraid - awful might happen 0 0 0 0  Total GAD 7 Score 1 9 6 8   Anxiety Difficulty Somewhat difficult - - Somewhat difficult    Outpatient Encounter Medications as of 02/08/2020  Medication Sig  . acetaminophen-codeine (TYLENOL #3) 300-30 MG tablet TAKE 1 TABLET BY MOUTH AT BEDTIME AS NEEDED FOR MODERATE PAIN.  Ascorbic Acid (VITAMIN C) 1000 MG tablet Take 1,000 mg by mouth daily.  aspirin EC 81 MG tablet Take 162 mg by mouth daily.   . Cholecalciferol (VITAMIN D) 2000 UNITS tablet Take 2,000 Units by mouth daily.  . diclofenac (VOLTAREN) 50 MG EC tablet Take 1 tablet (50 mg total) by mouth 2 (two) times daily.  escitalopram (LEXAPRO) 10 MG  tablet Take 1 tablet (10 mg total) by mouth daily.  . fluticasone (FLONASE) 50 MCG/ACT nasal spray USE 2 SPRAYS IN EACH NOSTRIL EVERY DAY  . furosemide (LASIX) 20 MG tablet Take 1 tablet (20 mg total) by mouth daily. (Patient taking differently: Take 40 mg by mouth daily. )  . Multiple Vitamin (MULTIVITAMIN WITH MINERALS) TABS Take 1 tablet by mouth daily.  OVER THE COUNTER MEDICATION zinc  . OVER THE COUNTER MEDICATION Flaxseed oil   Facility-Administered Encounter Medications as of 02/08/2020  Medication  . sodium chloride irrigation 0.9 %     Goals Addressed              This Visit's Progress   .  Client will communicate with LCSW in next 30 days to discuss medical challenges of client and to discuss daily activities completion of client (pt-stated)        CARE PLAN ENTRY   Current Barriers:  . Patient with chronic diagnoses of Metabolic Syndrome, Hx of Pulmonary embolus, Depression, Dyspnea on Exertion, Morbid Obesity, Type 2 DM, Hx of DVT  Clinical Social Work Clinical Goal(s):  02/10/2020 LCSW to call client in next 30 days to discuss medical challenges of client and to discuss client completion of daily activities  Interventions: . Talked with client about CCM program . Talked with client about mobility of client . Talked with client about RNCM support with CCM program .  Talked with client about medication procurement of client . Talked with client about pain issues of client . Talked with client about vision of client . Talked with client about meal provision of client . Talked with client about transport needs of client  . Talked with client about upcoming client medical appointments . Talked with client about relaxation techniques (karate, martial arts, walking outside) . Talked with client about social support (support from his sister,support from his father) . Talked with client about client completion of ADLs . Talked with client about his use of diuretic (has  occasional swelling in legs) . Talked with client about Meals on Wheels support (LCSW gave client the number to call for Meals on Wheels support)  Patient Self Care Activities:   Prepares meals as needed Drives car as needed to appointments or to complete errands Takes medications as prescribed   Patient Self Care Deficits:  Mobility challenges  Initial goal documentation       Follow Up Plan: LCSW to call client in next 30 days to discuss medical challenges of client and to discuss client completion of daily activities  Kelton Pillar.Sajid Ruppert MSW, LCSW Licensed Clinical Social Worker [facility] Care Management [phone]

## 2020-02-08 NOTE — Patient Instructions (Addendum)
Licensed Clinical Social Worker Visit Information  Goals we discussed today:  Goals Addressed              This Visit's Progress   .  Client will communicate with LCSW in next 30 days to discuss medical challenges of client and to discuss daily activities completion of client (pt-stated)        CARE PLAN ENTRY   Current Barriers:  . Patient with chronic diagnoses of Metabolic Syndrome, Hx of Pulmonary embolus, Depression, Dyspnea on Exertion, Morbid Obesity, Type 2 DM, Hx of DVT  Clinical Social Work Clinical Goal(s):  Marland Kitchen LCSW to call client in next 30 days to discuss medical challenges of client and to discuss client completion of daily activities  Interventions: . Talked with client about CCM program . Talked with client about mobility of client . Talked with client about RNCM support with CCM program . Talked with client about medication procurement of client . Talked with client about pain issues of client . Talked with client about vision of client . Talked with client about meal provision of client . Talked with client about transport needs of client  . Talked with client about upcoming client medical appointments . Talked with client about relaxation techniques (karate, martial arts, walking outside) . Talked with client about social support (support from his sister,support from his father) . Talked with client about client completion of ADLs . Talked with client about his use of diuretic (has occasional swelling in legs) . Talked with client about Meals on Wheels support (LCSW gave client the number to call for Meals on Wheels support)  Patient Self Care Activities:   Prepares meals as needed Drives car as needed to appointments or to complete errands Takes medications as prescribed   Patient Self Care Deficits:  Mobility challenges   Initial goal documentation        Materials Provided: No  Follow Up Plan: LCSW to call client in next 30 days to discuss  medical challenges of client and to discuss client completion of daily activities  The patient verbalized understanding of instructions provided today and declined a print copy of patient instruction materials.   Kelton Pillar.Rabia Argote MSW, LCSW Licensed Clinical Social Worker [facility] [phone]

## 2020-03-15 ENCOUNTER — Ambulatory Visit (INDEPENDENT_AMBULATORY_CARE_PROVIDER_SITE_OTHER): Payer: Medicare Other | Admitting: Licensed Clinical Social Worker

## 2020-03-15 DIAGNOSIS — Z86718 Personal history of other venous thrombosis and embolism: Secondary | ICD-10-CM

## 2020-03-15 DIAGNOSIS — R0600 Dyspnea, unspecified: Secondary | ICD-10-CM

## 2020-03-15 DIAGNOSIS — R0609 Other forms of dyspnea: Secondary | ICD-10-CM

## 2020-03-15 DIAGNOSIS — Z86711 Personal history of pulmonary embolism: Secondary | ICD-10-CM

## 2020-03-15 DIAGNOSIS — F339 Major depressive disorder, recurrent, unspecified: Secondary | ICD-10-CM

## 2020-03-15 DIAGNOSIS — E119 Type 2 diabetes mellitus without complications: Secondary | ICD-10-CM

## 2020-03-15 DIAGNOSIS — E8881 Metabolic syndrome: Secondary | ICD-10-CM

## 2020-03-15 NOTE — Chronic Care Management (AMB) (Addendum)
Chronic Care Management    Clinical Social Work Follow Up Note  03/15/2020 Name: Justin Schmidt MRN: [id number] DOB: [birth year]  Justin Schmidt is a 39 y.o. year old male who is a primary care patient of Gwenlyn Fudge, FNP. The CCM team was consulted for assistance with [employer] .   Review of patient status, including review of consultants reports, other relevant assessments, and collaboration with appropriate care team members and the patient's provider was performed as part of comprehensive patient evaluation and provision of chronic care management services.    SDOH (Social Determinants of Health) assessments performed: No; risk for tobacco use; risk for depression; risk for physical inactivity; risk for stress    Chronic Care Management from 02/08/2020 in [facility]  PHQ-9 Total Score 4       GAD 7 : Generalized Anxiety Score 02/08/2020 01/17/2020 10/14/2019 04/04/2019  Nervous, Anxious, on Edge 1 1 1 2   Control/stop worrying 0 2 1 1   Worry too much - different things 0 2 2 2   Trouble relaxing 0 1 1 1   Restless 0 0 0 0  Easily annoyed or irritable 0 3 1 2   Afraid - awful might happen 0 0 0 0  Total GAD 7 Score 1 9 6 8   Anxiety Difficulty Somewhat difficult - - Somewhat difficult    Outpatient Encounter Medications as of 03/15/2020  Medication Sig  . acetaminophen-codeine (TYLENOL #3) 300-30 MG tablet TAKE 1 TABLET BY MOUTH AT BEDTIME AS NEEDED FOR MODERATE PAIN.  Ascorbic Acid (VITAMIN C) 1000 MG tablet Take 1,000 mg by mouth daily.  aspirin EC 81 MG tablet Take 162 mg by mouth daily.   . Cholecalciferol (VITAMIN D) 2000 UNITS tablet Take 2,000 Units by mouth daily.  . diclofenac (VOLTAREN) 50 MG EC tablet Take 1 tablet (50 mg total) by mouth 2 (two) times daily.  escitalopram (LEXAPRO) 10 MG tablet Take 1 tablet (10 mg total) by mouth daily.  . fluticasone (FLONASE) 50 MCG/ACT nasal spray USE 2 SPRAYS IN EACH NOSTRIL EVERY DAY  .  furosemide (LASIX) 20 MG tablet Take 1 tablet (20 mg total) by mouth daily. (Patient taking differently: Take 40 mg by mouth daily. )  . Multiple Vitamin (MULTIVITAMIN WITH MINERALS) TABS Take 1 tablet by mouth daily.  OVER THE COUNTER MEDICATION zinc  . OVER THE COUNTER MEDICATION Flaxseed oil   Facility-Administered Encounter Medications as of 03/15/2020  Medication  . sodium chloride irrigation 0.9 %    Goals    .  Client will communicate with LCSW in next 30 days to discuss medical challenges of client and to discuss daily activities completion of client (pt-stated)      CARE PLAN ENTRY   Current Barriers:  . Patient with chronic diagnoses of Metabolic Syndrome, Hx of Pulmonary embolus, Depression, Dyspnea on Exertion, Morbid Obesity, Type 2 DM, Hx of DVT  Clinical Social Work Clinical Goal(s):  03/17/2020 LCSW to call client in next 30 days to discuss medical challenges of client and to discuss client completion of daily activities  Interventions: . Talked with client about CCM program . Talked with client about mobility of client . Talked with client about RNCM support with CCM program . Talked with client about medication procurement of client . Talked with client about pain issues of client . Talked with client about vision of client . Talked with client about meal provision of client . Talked with client about transport needs  of client  . Talked with client about upcoming client medical appointments . Talked with client about relaxation techniques (karate, martial arts, walking outside) . Talked with client about social support (support from his sister,support from his father) . Talked with client about client completion of ADLs . Talked with client about his use of diuretic (has occasional swelling in legs) . Talked with client about Meals on Wheels support (LCSW gave client the number to call for Meals on Wheels support) . Talked with client about his plans to obtain knee brace  (he has prescription for knee brace) . Talked with client  about sleeping issues . Talked with client about exercise of client (said he walks when he is able and occasionally will lift weights to help with strength) . Talked with client about his appetite (he tries to eat a healthy diet and watch his sugar intake)  Patient Self Care Activities:   Prepares meals as needed Drives car as needed to appointments or to complete errands Takes medications as prescribed   Patient Self Care Deficits:  Mobility challenges    Initial goal documentation    Follow Up Plan: LCSW to call client in next 30 days to discuss medical challenges of client and to discuss client completion of daily activities  Kelton Pillar.Braniyah Besse MSW, LCSW Licensed Clinical Social Worker [facility] [phone]

## 2020-03-15 NOTE — Patient Instructions (Addendum)
Licensed Clinical Social Worker Visit Information  Goals we discussed today:    .  Client will communicate with LCSW in next 30 days to discuss medical challenges of client and to discuss daily activities completion of client (pt-stated)       CARE PLAN ENTRY   Current Barriers:   Patient with chronic diagnoses of Metabolic Syndrome, Hx of Pulmonary embolus, Depression, Dyspnea on Exertion, Morbid Obesity, Type 2 DM, Hx of DVT  Clinical Social Work Clinical Goal(s):   LCSW to call client in next 30 days to discuss medical challenges of client and to discuss client completion of daily activities  Interventions:  Talked with client about CCM program  Talked with client about mobility of client  Talked with client about RNCM support with CCM program  Talked with client about medication procurement of client  Talked with client about pain issues of client  Talked with client about vision of client  Talked with client about meal provision of client  Talked with client about transport needs of client   Talked with client about upcoming client medical appointments  Talked with client about relaxation techniques (karate, martial arts, walking outside)  Talked with client about social support (support from his sister,support from his father)  Talked with client about client completion of ADLs  Talked with client about his use of diuretic (has occasional swelling in legs)  Talked with client about Meals on Wheels support (LCSW gave client the number to call for Meals on Wheels support)  Talked with client about his plans to obtain knee brace (he has prescription for knee brace)  Talked with client  about sleeping issues  Talked with client about exercise of client (said he walks when he is able and occasionally will lift weights to help with strength)  Talked with client about his appetite (he tries to eat a healthy diet and watch his sugar intake)  Patient Self  Care Activities:   Prepares meals as needed Drives car as needed to appointments or to complete errands Takes medications as prescribed   Patient Self Care Deficits:  Mobility challenges    Initial goal documentation    Follow Up Plan:LCSW to call client in next 30 days to discuss medical challenges of client and to discuss client completion of daily activities  Materials Provided: No  The patient verbalized understanding of instructions provided today and declined a print copy of patient instruction materials.   Kelton Pillar.Darriona Dehaas MSW, LCSW Licensed Clinical Social Worker [facility] [phone]

## 2020-04-16 ENCOUNTER — Ambulatory Visit: Payer: Medicare Other | Admitting: Licensed Clinical Social Worker

## 2020-04-16 DIAGNOSIS — E8881 Metabolic syndrome: Secondary | ICD-10-CM

## 2020-04-16 DIAGNOSIS — R0609 Other forms of dyspnea: Secondary | ICD-10-CM

## 2020-04-16 DIAGNOSIS — Z86711 Personal history of pulmonary embolism: Secondary | ICD-10-CM

## 2020-04-16 DIAGNOSIS — F339 Major depressive disorder, recurrent, unspecified: Secondary | ICD-10-CM

## 2020-04-16 DIAGNOSIS — E119 Type 2 diabetes mellitus without complications: Secondary | ICD-10-CM

## 2020-04-16 DIAGNOSIS — Z86718 Personal history of other venous thrombosis and embolism: Secondary | ICD-10-CM

## 2020-04-16 NOTE — Patient Instructions (Addendum)
Licensed Clinical Child psychotherapist Visit Information  Goals we discussed today:    Client will communicate with LCSW in next 30 days to discuss medical challenges of client and to discuss daily activities completion of client (pt-stated)        CARE PLAN ENTRY   Current Barriers:   Patient with chronic diagnoses of Metabolic Syndrome, Hx of Pulmonary embolus, Depression, Dyspnea on Exertion, Morbid Obesity, Type 2 DM, Hx of DVT  Clinical Social Work Clinical Goal(s):   LCSW to call client in next 30 days to discuss medical challenges of client and to discuss client completion of daily activities  Interventions:  Talked with client about CCM program  Talked with client about mobility of client  Talked with client about RNCM support with CCM program  Talked with client about medication procurement of client  Talked with client about pain issues of client  Talked with client about vision of client  Talked with client about meal provision of client  Talked with client about transport needs of client   Talked with client about upcoming client medical appointments  Talked with client about relaxation techniques (karate, martial arts, walking outside)  Talked with client about social support (support from his sister,support from his father)  Talked with client about client completion of ADLs  Talked with client about his use of diuretic (has occasional swelling in legs)  Talked with client about Meals on Wheels support (LCSW gave client the number to call for Meals on Wheels support)  Talked with client about his appointment tomorrow with Harlow Mares FNP at 2:00 PM at [facility]  Talked with client about skin care of client   Patient Self Care Activities:   Prepares meals as needed Drives car as needed to appointments or to complete errands Takes medications as prescribed   Patient Self Care Deficits:  Mobility challenges    Initial goal  documentation     Follow Up Plan: LCSW to call client in next 30 days to discuss medical challenges of client and to discuss client completion of daily activities  Materials Provided: No  The patient verbalized understanding of instructions provided today and declined a print copy of patient instruction materials.   Kelton Pillar.Willowdean Luhmann MSW, LCSW Licensed Clinical Social Worker [facility] [phone]

## 2020-04-16 NOTE — Chronic Care Management (AMB) (Signed)
Chronic Care Management    Clinical Social Work Follow Up Note  04/16/2020 Name: COSIMO SCHERTZER MRN: [id number] DOB: 03-14-1981  [name] is a 39 y.o. year old male who is a primary care patient of Gwenlyn Fudge, FNP. The CCM team was consulted for assistance with [employer] .   Review of patient status, including review of consultants reports, other relevant assessments, and collaboration with appropriate care team members and the patient's provider was performed as part of comprehensive patient evaluation and provision of chronic care management services.    SDOH (Social Determinants of Health) assessments performed: No; risk for depression; risk for tobacco use; risk for stress; risk for financial strain  Flowsheet Row Chronic Care Management from 02/08/2020 in [facility]  PHQ-9 Total Score 4      GAD 7 : Generalized Anxiety Score 02/08/2020 01/17/2020 10/14/2019 04/04/2019  Nervous, Anxious, on Edge 1 1 1 2   Control/stop worrying 0 2 1 1   Worry too much - different things 0 2 2 2   Trouble relaxing 0 1 1 1   Restless 0 0 0 0  Easily annoyed or irritable 0 3 1 2   Afraid - awful might happen 0 0 0 0  Total GAD 7 Score 1 9 6 8   Anxiety Difficulty Somewhat difficult - - Somewhat difficult    Outpatient Encounter Medications as of 04/16/2020  Medication Sig  . acetaminophen-codeine (TYLENOL #3) 300-30 MG tablet TAKE 1 TABLET BY MOUTH AT BEDTIME AS NEEDED FOR MODERATE PAIN.  Ascorbic Acid (VITAMIN C) 1000 MG tablet Take 1,000 mg by mouth daily.  aspirin EC 81 MG tablet Take 162 mg by mouth daily.   . Cholecalciferol (VITAMIN D) 2000 UNITS tablet Take 2,000 Units by mouth daily.  . diclofenac (VOLTAREN) 50 MG EC tablet Take 1 tablet (50 mg total) by mouth 2 (two) times daily.  escitalopram (LEXAPRO) 10 MG tablet Take 1 tablet (10 mg total) by mouth daily.  . fluticasone (FLONASE) 50 MCG/ACT nasal spray USE 2 SPRAYS IN EACH NOSTRIL EVERY DAY   . furosemide (LASIX) 20 MG tablet Take 1 tablet (20 mg total) by mouth daily. (Patient taking differently: Take 40 mg by mouth daily. )  . Multiple Vitamin (MULTIVITAMIN WITH MINERALS) TABS Take 1 tablet by mouth daily.  OVER THE COUNTER MEDICATION zinc  . OVER THE COUNTER MEDICATION Flaxseed oil   Facility-Administered Encounter Medications as of 04/16/2020  Medication  . sodium chloride irrigation 0.9 %    Goals    .  Client will communicate with LCSW in next 30 days to discuss medical challenges of client and to discuss daily activities completion of client (pt-stated)      CARE PLAN ENTRY   Current Barriers:  . Patient with chronic diagnoses of Metabolic Syndrome, Hx of Pulmonary embolus, Depression, Dyspnea on Exertion, Morbid Obesity, Type 2 DM, Hx of DVT  Clinical Social Work Clinical Goal(s):  04/18/2020 LCSW to call client in next 30 days to discuss medical challenges of client and to discuss client completion of daily activities  Interventions: . Talked with client about CCM program . Talked with client about mobility of client . Talked with client about RNCM support with CCM program . Talked with client about medication procurement of client . Talked with client about pain issues of client . Talked with client about vision of client . Talked with client about meal provision of client . Talked with client about transport needs of  client  . Talked with client about upcoming client medical appointments . Talked with client about relaxation techniques (karate, martial arts, walking outside) . Talked with client about social support (support from his sister,support from his father) . Talked with client about client completion of ADLs . Talked with client about his use of diuretic (has occasional swelling in legs) . Talked with client about Meals on Wheels support (LCSW gave client the number to call for Meals on Wheels support) . Talked with client about his appointment tomorrow  with Harlow Mares FNP at 2:00 PM at [facility] . Talked with client about skin care of client   Patient Self Care Activities:   Prepares meals as needed Drives car as needed to appointments or to complete errands Takes medications as prescribed   Patient Self Care Deficits:  Mobility challenges    Initial goal documentation     Follow Up Plan: LCSW to call client in next 30 days to discuss medical challenges of client and to discuss client completion of daily activities  Kelton Pillar.Kemoni Quesenberry MSW, LCSW Licensed Clinical Social Worker [facility] [phone]

## 2020-04-17 ENCOUNTER — Ambulatory Visit (INDEPENDENT_AMBULATORY_CARE_PROVIDER_SITE_OTHER): Payer: Medicare Other | Admitting: Family Medicine

## 2020-04-17 ENCOUNTER — Other Ambulatory Visit: Payer: Self-pay

## 2020-04-17 ENCOUNTER — Encounter: Payer: Self-pay | Admitting: Family Medicine

## 2020-04-17 VITALS — BP 119/71 | HR 89 | Temp 96.4°F | Ht 69.0 in | Wt >= 6400 oz

## 2020-04-17 DIAGNOSIS — M545 Low back pain, unspecified: Secondary | ICD-10-CM

## 2020-04-17 DIAGNOSIS — G8929 Other chronic pain: Secondary | ICD-10-CM

## 2020-04-17 DIAGNOSIS — E119 Type 2 diabetes mellitus without complications: Secondary | ICD-10-CM

## 2020-04-17 DIAGNOSIS — E8881 Metabolic syndrome: Secondary | ICD-10-CM | POA: Diagnosis not present

## 2020-04-17 DIAGNOSIS — M25562 Pain in left knee: Secondary | ICD-10-CM | POA: Diagnosis not present

## 2020-04-17 DIAGNOSIS — Z79899 Other long term (current) drug therapy: Secondary | ICD-10-CM

## 2020-04-17 DIAGNOSIS — J329 Chronic sinusitis, unspecified: Secondary | ICD-10-CM

## 2020-04-17 LAB — BAYER DCA HB A1C WAIVED: HB A1C (BAYER DCA - WAIVED): 6.7 % (ref <7.0)

## 2020-04-17 MED ORDER — FLUTICASONE PROPIONATE 50 MCG/ACT NA SUSP: 2.0000 | Freq: Every day | NASAL | 4 refills | Status: DC

## 2020-04-17 MED ORDER — ACETAMINOPHEN-CODEINE #3 300-30 MG PO TABS: ORAL_TABLET | ORAL | 2 refills | Status: DC
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
## 2020-04-17 NOTE — Patient Instructions (Signed)
American Heart Association (AHA) Exercise Recommendation  Being physically active is important to prevent heart disease and stroke, the nations No. 1and No. 5killers. To improve overall cardiovascular health, we suggest at least 150 minutes per week of moderate exercise or 75 minutes per week of vigorous exercise (or a combination of moderate and vigorous activity). Thirty minutes a day, five times a week is an easy goal to remember. You will also experience benefits even if you divide your time into two or three segments of 10 to 15 minutes per day.  For people who would benefit from lowering their blood pressure or cholesterol, we recommend 40 minutes of aerobic exercise of moderate to vigorous intensity three to four times a week to lower the risk for heart attack and stroke.  Physical activity is anything that makes you move your body and burn calories.  This includes things like climbing stairs or playing sports. Aerobic exercises benefit your heart, and include walking, jogging, swimming or biking. Strength and stretching exercises are best for overall stamina and flexibility.  The simplest, positive change you can make to effectively improve your heart health is to start walking. It's enjoyable, free, easy, social and great exercise. A walking program is flexible and boasts high success rates because people can stick with it. It's easy for walking to become a regular and satisfying part of life.   For Overall Cardiovascular Health:  At least 30 minutes of moderate-intensity aerobic activity at least 5 days per week for a total of 150  OR   At least 25 minutes of vigorous aerobic activity at least 3 days per week for a total of 75 minutes; or a combination of moderate- and vigorous-intensity aerobic activity  AND   Moderate- to high-intensity muscle-strengthening activity at least 2 days per week for additional health benefits.  For Lowering Blood Pressure and Cholesterol  An  average 40 minutes of moderate- to vigorous-intensity aerobic activity 3 or 4 times per week  What if I cant make it to the time goal? Something is always better than nothing! And everyone has to start somewhere. Even if you've been sedentary for years, today is the day you can begin to make healthy changes in your life. If you don't think you'll make it for 30 or 40 minutes, set a reachable goal for today. You can work up toward your overall goal by increasing your time as you get stronger. Don't let all-or-nothing thinking rob you of doing what you can every day.  Source:http://www.heart.org   Diabetes Mellitus and Nutrition, Adult When you have diabetes (diabetes mellitus), it is very important to have healthy eating habits because your blood sugar (glucose) levels are greatly affected by what you eat and drink. Eating healthy foods in the appropriate amounts, at about the same times every day, can help you:  Control your blood glucose.  Lower your risk of heart disease.  Improve your blood pressure.  Reach or maintain a healthy weight. Every person with diabetes is different, and each person has different needs for a meal plan. Your health care provider may recommend that you work with a diet and nutrition specialist (dietitian) to make a meal plan that is best for you. Your meal plan may vary depending on factors such as:  The calories you need.  The medicines you take.  Your weight.  Your blood glucose, blood pressure, and cholesterol levels.  Your activity level.  Other health conditions you have, such as heart or kidney disease. How  do carbohydrates affect me? Carbohydrates, also called carbs, affect your blood glucose level more than any other type of food. Eating carbs naturally raises the amount of glucose in your blood. Carb counting is a method for keeping track of how many carbs you eat. Counting carbs is important to keep your blood glucose at a healthy level,  especially if you use insulin or take certain oral diabetes medicines. It is important to know how many carbs you can safely have in each meal. This is different for every person. Your dietitian can help you calculate how many carbs you should have at each meal and for each snack. Foods that contain carbs include:  Bread, cereal, rice, pasta, and crackers.  Potatoes and corn.  Peas, beans, and lentils.  Milk and yogurt.  Fruit and juice.  Desserts, such as cakes, cookies, ice cream, and candy. How does alcohol affect me? Alcohol can cause a sudden decrease in blood glucose (hypoglycemia), especially if you use insulin or take certain oral diabetes medicines. Hypoglycemia can be a life-threatening condition. Symptoms of hypoglycemia (sleepiness, dizziness, and confusion) are similar to symptoms of having too much alcohol. If your health care provider says that alcohol is safe for you, follow these guidelines:  Limit alcohol intake to no more than 1 drink per day for nonpregnant women and 2 drinks per day for men. One drink equals 12 oz of beer, 5 oz of wine, or 1 oz of hard liquor.  Do not drink on an empty stomach.  Keep yourself hydrated with water, diet soda, or unsweetened iced tea.  Keep in mind that regular soda, juice, and other mixers may contain a lot of sugar and must be counted as carbs. What are tips for following this plan?  Reading food labels  Start by checking the serving size on the "Nutrition Facts" label of packaged foods and drinks. The amount of calories, carbs, fats, and other nutrients listed on the label is based on one serving of the item. Many items contain more than one serving per package.  Check the total grams (g) of carbs in one serving. You can calculate the number of servings of carbs in one serving by dividing the total carbs by 15. For example, if a food has 30 g of total carbs, it would be equal to 2 servings of carbs.  Check the number of grams  (g) of saturated and trans fats in one serving. Choose foods that have low or no amount of these fats.  Check the number of milligrams (mg) of salt (sodium) in one serving. Most people should limit total sodium intake to less than 2,300 mg per day.  Always check the nutrition information of foods labeled as "low-fat" or "nonfat". These foods may be higher in added sugar or refined carbs and should be avoided.  Talk to your dietitian to identify your daily goals for nutrients listed on the label. Shopping  Avoid buying canned, premade, or processed foods. These foods tend to be high in fat, sodium, and added sugar.  Shop around the outside edge of the grocery store. This includes fresh fruits and vegetables, bulk grains, fresh meats, and fresh dairy. Cooking  Use low-heat cooking methods, such as baking, instead of high-heat cooking methods like deep frying.  Cook using healthy oils, such as olive, canola, or sunflower oil.  Avoid cooking with butter, cream, or high-fat meats. Meal planning  Eat meals and snacks regularly, preferably at the same times every day. Avoid going long  periods of time without eating.  Eat foods high in fiber, such as fresh fruits, vegetables, beans, and whole grains. Talk to your dietitian about how many servings of carbs you can eat at each meal.  Eat 4-6 ounces (oz) of lean protein each day, such as lean meat, chicken, fish, eggs, or tofu. One oz of lean protein is equal to: ? 1 oz of meat, chicken, or fish. ? 1 egg. ?  cup of tofu.  Eat some foods each day that contain healthy fats, such as avocado, nuts, seeds, and fish. Lifestyle  Check your blood glucose regularly.  Exercise regularly as told by your health care provider. This may include: ? 150 minutes of moderate-intensity or vigorous-intensity exercise each week. This could be brisk walking, biking, or water aerobics. ? Stretching and doing strength exercises, such as yoga or weightlifting, at  least 2 times a week.  Take medicines as told by your health care provider.  Do not use any products that contain nicotine or tobacco, such as cigarettes and e-cigarettes. If you need help quitting, ask your health care provider.  Work with a Veterinary surgeon or diabetes educator to identify strategies to manage stress and any emotional and social challenges. Questions to ask a health care provider  Do I need to meet with a diabetes educator?  Do I need to meet with a dietitian?  What number can I call if I have questions?  When are the best times to check my blood glucose? Where to find more information:  American Diabetes Association: diabetes.org  Academy of Nutrition and Dietetics: www.eatright.[employer] of Diabetes and Digestive and Kidney Diseases (NIH): CarFlippers.tn Summary  A healthy meal plan will help you control your blood glucose and maintain a healthy lifestyle.  Working with a diet and nutrition specialist (dietitian) can help you make a meal plan that is best for you.  Keep in mind that carbohydrates (carbs) and alcohol have immediate effects on your blood glucose levels. It is important to count carbs and to use alcohol carefully. This information is not intended to replace advice given to you by your health care provider. Make sure you discuss any questions you have with your health care provider. Document Revised: 03/27/2017 Document Reviewed: 05/19/2016 Elsevier Patient Education  2020 [employer].

## 2020-04-18 LAB — CMP14+EGFR
ALT: 50 IU/L — ABNORMAL HIGH (ref 0–44)
AST: 25 IU/L (ref 0–40)
Albumin/Globulin Ratio: 1.5 (ref 1.2–2.2)
Albumin: 4.4 g/dL (ref 4.0–5.0)
Alkaline Phosphatase: 75 IU/L (ref 44–121)
BUN/Creatinine Ratio: 11 (ref 9–20)
BUN: 12 mg/dL (ref 6–24)
Bilirubin Total: 0.3 mg/dL (ref 0.0–1.2)
CO2: 26 mmol/L (ref 20–29)
Calcium: 9.7 mg/dL (ref 8.7–10.2)
Chloride: 105 mmol/L (ref 96–106)
Creatinine, Ser: 1.13 mg/dL (ref 0.76–1.27)
GFR calc Af Amer: 93 mL/min/{1.73_m2} (ref >59)
GFR calc non Af Amer: 81 mL/min/{1.73_m2} (ref >59)
Globulin, Total: 2.9 g/dL (ref 1.5–4.5)
Glucose: 104 mg/dL — ABNORMAL HIGH (ref 65–99)
Potassium: 5.1 mmol/L (ref 3.5–5.2)
Sodium: 143 mmol/L (ref 134–144)
Total Protein: 7.3 g/dL (ref 6.0–8.5)

## 2020-04-18 LAB — CBC WITH DIFFERENTIAL/PLATELET
Basophils Absolute: 0.1 10*3/uL (ref 0.0–0.2)
Basos: 1 %
EOS (ABSOLUTE): 0.1 10*3/uL (ref 0.0–0.4)
Eos: 2 %
Hematocrit: 44 % (ref 37.5–51.0)
Hemoglobin: 15.2 g/dL (ref 13.0–17.7)
Immature Grans (Abs): 0 10*3/uL (ref 0.0–0.1)
Immature Granulocytes: 0 %
Lymphocytes Absolute: 1.6 10*3/uL (ref 0.7–3.1)
Lymphs: 25 %
MCH: 31.3 pg (ref 26.6–33.0)
MCHC: 34.5 g/dL (ref 31.5–35.7)
MCV: 91 fL (ref 79–97)
Monocytes Absolute: 0.4 10*3/uL (ref 0.1–0.9)
Monocytes: 7 %
Neutrophils Absolute: 4 10*3/uL (ref 1.4–7.0)
Neutrophils: 65 %
Platelets: 291 10*3/uL (ref 150–450)
RBC: 4.86 x10E6/uL (ref 4.14–5.80)
RDW: 12.7 % (ref 11.6–15.4)
WBC: 6.2 10*3/uL (ref 3.4–10.8)

## 2020-04-18 LAB — LIPID PANEL
Chol/HDL Ratio: 3.6 ratio (ref 0.0–5.0)
Cholesterol, Total: 167 mg/dL (ref 100–199)
HDL: 46 mg/dL (ref >39)
LDL Chol Calc (NIH): 104 mg/dL — ABNORMAL HIGH (ref 0–99)
Triglycerides: 92 mg/dL (ref 0–149)
VLDL Cholesterol Cal: 17 mg/dL (ref 5–40)

## 2020-05-23 ENCOUNTER — Ambulatory Visit: Payer: Medicare Other | Admitting: Licensed Clinical Social Worker

## 2020-05-23 DIAGNOSIS — Z86718 Personal history of other venous thrombosis and embolism: Secondary | ICD-10-CM

## 2020-05-23 DIAGNOSIS — E119 Type 2 diabetes mellitus without complications: Secondary | ICD-10-CM

## 2020-05-23 DIAGNOSIS — F339 Major depressive disorder, recurrent, unspecified: Secondary | ICD-10-CM

## 2020-05-23 DIAGNOSIS — R0600 Dyspnea, unspecified: Secondary | ICD-10-CM

## 2020-05-23 DIAGNOSIS — R0609 Other forms of dyspnea: Secondary | ICD-10-CM

## 2020-05-23 DIAGNOSIS — Z86711 Personal history of pulmonary embolism: Secondary | ICD-10-CM

## 2020-05-23 NOTE — Patient Instructions (Addendum)
Licensed Clinical Social Worker Visit Information  Goals we discussed today:  .  Client will communicate with LCSW in next 30 days to discuss medical challenges of client and to discuss daily activities completion of client (pt-stated)        CARE PLAN ENTRY   Current Barriers:   Patient with chronic diagnoses of Metabolic Syndrome, Hx of Pulmonary embolus, Depression, Dyspnea on Exertion, Morbid Obesity, Type 2 DM, Hx of DVT  Clinical Social Work Clinical Goal(s):   LCSW to call client in next 30 days to discuss medical challenges of client and to discuss client completion of daily activities  Interventions:  Talked with client about CCM program  Talked with client about mobility of client  Talked with client about RNCM support with CCM program  Talked with client about medication procurement of client  Talked with client about pain issues of client  Talked with client about vision of client  Talked with client about meal provision of client  Talked with client about transport needs of client   Talked with client about upcoming client medical appointments  Talked with client about relaxation techniques (karate, martial arts, walking outside)  Talked with client about social support (support from his sister,support from his father)  Talked with client about client completion of ADLs  Talked with client about his use of diuretic (has occasional swelling in legs)  Talked with client about Meals on Wheels support (LCSW gave client the number to call for Meals on Wheels support)  Talked with [name] about [facility] Frozen Meals Program  Talked with client about mood of client  Patient Self Care Activities:   Prepares meals as needed Drives car as needed to appointments or to complete errands Takes medications as prescribed   Patient Self Care Deficits:  Mobility challenges    Initial goal documentation     Follow Up Plan:LCSW to call client in  next 30 days to discuss medical challenges of client and to discuss client completion of daily activities  Materials Provided: No  The patient verbalized understanding of instructions provided today and declined a print copy of patient instruction materials.   Justin Schmidt.Justin Schmidt MSW, LCSW Licensed Clinical Social Worker [facility] Care Management [phone]

## 2020-05-23 NOTE — Chronic Care Management (AMB) (Signed)
Chronic Care Management    Clinical Social Work Follow Up Note  05/23/2020 Name: LESHAUN BIEBEL MRN: [id number] DOB: September 27, 1980  [name] is a 40 y.o. year old male who is a primary care patient of Gwenlyn Fudge, FNP. The CCM team was consulted for assistance with [employer] .   Review of patient status, including review of consultants reports, other relevant assessments, and collaboration with appropriate care team members and the patient's provider was performed as part of comprehensive patient evaluation and provision of chronic care management services.    SDOH (Social Determinants of Health) assessments performed: No; risk for depression; risk for tobacco use; risk for stress; risk for physical inactivity  Flowsheet Row Chronic Care Management from 02/08/2020 in [facility]  PHQ-9 Total Score 4     GAD 7 : Generalized Anxiety Score 02/08/2020 01/17/2020 10/14/2019 04/04/2019  Nervous, Anxious, on Edge 1 1 1 2   Control/stop worrying 0 2 1 1   Worry too much - different things 0 2 2 2   Trouble relaxing 0 1 1 1   Restless 0 0 0 0  Easily annoyed or irritable 0 3 1 2   Afraid - awful might happen 0 0 0 0  Total GAD 7 Score 1 9 6 8   Anxiety Difficulty Somewhat difficult - - Somewhat difficult    Outpatient Encounter Medications as of 05/23/2020  Medication Sig  . acetaminophen-codeine (TYLENOL #3) 300-30 MG tablet TAKE 1 TABLET BY MOUTH AT BEDTIME AS NEEDED FOR MODERATE PAIN.  Ascorbic Acid (VITAMIN C) 1000 MG tablet Take 1,000 mg by mouth daily.  aspirin EC 81 MG tablet Take 162 mg by mouth daily.   . Cholecalciferol (VITAMIN D) 2000 UNITS tablet Take 2,000 Units by mouth daily.  CINNAMON PO Take by mouth.  . diclofenac (VOLTAREN) 50 MG EC tablet Take 1 tablet (50 mg total) by mouth 2 (two) times daily.  escitalopram (LEXAPRO) 10 MG tablet Take 1 tablet (10 mg total) by mouth daily.  . fluticasone (FLONASE) 50 MCG/ACT nasal spray Place 2  sprays into both nostrils daily.  . furosemide (LASIX) 20 MG tablet Take 1 tablet (20 mg total) by mouth daily. (Patient taking differently: Take 40 mg by mouth daily.)  . GARLIC PO Take by mouth.  . Multiple Vitamin (MULTIVITAMIN WITH MINERALS) TABS Take 1 tablet by mouth daily.  OVER THE COUNTER MEDICATION zinc  . OVER THE COUNTER MEDICATION Flaxseed oil   Facility-Administered Encounter Medications as of 05/23/2020  Medication  . sodium chloride irrigation 0.9 %    Goals    .  Client will communicate with LCSW in next 30 days to discuss medical challenges of client and to discuss daily activities completion of client (pt-stated)      CARE PLAN ENTRY   Current Barriers:  . Patient with chronic diagnoses of Metabolic Syndrome, Hx of Pulmonary embolus, Depression, Dyspnea on Exertion, Morbid Obesity, Type 2 DM, Hx of DVT  Clinical Social Work Clinical Goal(s):  Marland Kitchen LCSW to call client in next 30 days to discuss medical challenges of client and to discuss client completion of daily activities  Interventions: . Talked with client about CCM program . Talked with client about mobility of client . Talked with client about RNCM support with CCM program . Talked with client about medication procurement of client . Talked with client about pain issues of client . Talked with client about vision of client . Talked with client about meal  provision of client . Talked with client about transport needs of client  . Talked with client about upcoming client medical appointments . Talked with client about relaxation techniques (karate, martial arts, walking outside) . Talked with client about social support (support from his sister,support from his father) . Talked with client about client completion of ADLs . Talked with client about his use of diuretic (has occasional swelling in legs) . Talked with client about Meals on Wheels support (LCSW gave client the number to call for Meals on Wheels  support) . Talked with [name] about [facility] Frozen Meals Program . Talked with client about mood of client  Patient Self Care Activities:   Prepares meals as needed Drives car as needed to appointments or to complete errands Takes medications as prescribed   Patient Self Care Deficits:  Mobility challenges    Initial goal documentation     Follow Up Plan: LCSW to call client in next 30 days to discuss medical challenges of client and to discuss client completion of daily activities  Kelton Pillar.Shanigua Gibb MSW, LCSW Licensed Clinical Social Worker [facility] Care Management [phone]

## 2020-05-28 ENCOUNTER — Other Ambulatory Visit: Payer: Self-pay | Admitting: Family Medicine

## 2020-05-28 DIAGNOSIS — F339 Major depressive disorder, recurrent, unspecified: Secondary | ICD-10-CM

## 2020-06-26 ENCOUNTER — Ambulatory Visit (INDEPENDENT_AMBULATORY_CARE_PROVIDER_SITE_OTHER): Payer: Medicare Other | Admitting: Licensed Clinical Social Worker

## 2020-06-26 DIAGNOSIS — Z6841 Body Mass Index (BMI) 40.0 and over, adult: Secondary | ICD-10-CM

## 2020-06-26 DIAGNOSIS — R0600 Dyspnea, unspecified: Secondary | ICD-10-CM

## 2020-06-26 DIAGNOSIS — Z86718 Personal history of other venous thrombosis and embolism: Secondary | ICD-10-CM

## 2020-06-26 DIAGNOSIS — F339 Major depressive disorder, recurrent, unspecified: Secondary | ICD-10-CM

## 2020-06-26 DIAGNOSIS — E119 Type 2 diabetes mellitus without complications: Secondary | ICD-10-CM

## 2020-06-26 DIAGNOSIS — E8881 Metabolic syndrome: Secondary | ICD-10-CM

## 2020-06-26 DIAGNOSIS — R0609 Other forms of dyspnea: Secondary | ICD-10-CM

## 2020-06-26 DIAGNOSIS — Z86711 Personal history of pulmonary embolism: Secondary | ICD-10-CM

## 2020-06-26 NOTE — Patient Instructions (Addendum)
Licensed Clinical Social Worker Visit Information  Goals we discussed today:  .  Client will communicate with LCSW in next 30 days to discuss medical challenges of client and to discuss daily activities completion of client (pt-stated)        CARE PLAN ENTRY   Current Barriers:   Patient with chronic diagnoses of Metabolic Syndrome, Hx of Pulmonary embolus, Depression, Dyspnea on Exertion, Morbid Obesity, Type 2 DM, Hx of DVT  Clinical Social Work Clinical Goal(s):   LCSW to call client in next 30 days to discuss medical challenges of client and to discuss client completion of daily activities  Interventions:   Talked with client about his use of compression hose (he said he has two pairs of compression hose he wears)  Talked with client about CCM program  Talked with client about mobility of client  Talked with client about RNCM support with CCM program  Talked with client about medication procurement of client  Talked with client about pain issues of client  Talked with client about vision of client  Talked with client about meal provision of client  Talked with client about transport needs of client   Talked with client about upcoming client medical appointments  Talked with client about relaxation techniques (karate, martial arts, walking outside)  Talked with client about social support (support from his sister,support from his father)  Talked with client about client completion of ADLs  Talked with client about his use of diuretic (has occasional swelling in legs)  Talked with client about Meals on Wheels support (LCSW gave client the number to call for Meals on Wheels support)  Talked with client about sleeping issues of client  Talked with client about weight challenges of client (client has challenges with weight although he is exercising, is watching his diet and does take diuretic as prescribed)  Talked with client about his use of [employer]  card provided through his insurance  Collaborated with RNCM regarding weight challenges of client   Patient Self Care Activities:   Prepares meals as needed Drives car as needed to appointments or to complete errands Takes medications as prescribed   Patient Self Care Deficits:  Mobility challenges    Initial goal documentation     Follow Up Plan:LCSW to call client in next 30 days to discuss medical challenges of client and to discuss client completion of daily activities  Materials Provided: No  The patient verbalized understanding of instructions provided today and declined a print copy of patient instruction materials.   Kelton Pillar.Shoshanna Mcquitty MSW, LCSW Licensed Clinical Social Worker [facility] Care Management [phone]

## 2020-06-26 NOTE — Chronic Care Management (AMB) (Signed)
Chronic Care Management    Clinical Social Work Follow Up Note  06/26/2020 Name: GAY RAPE MRN: [id number] DOB: 21-Nov-1980  [name] is a 40 y.o. year old male who is a primary care patient of Gwenlyn Fudge, FNP. The CCM team was consulted for assistance with [employer] .   Review of patient status, including review of consultants reports, other relevant assessments, and collaboration with appropriate care team members and the patient's provider was performed as part of comprehensive patient evaluation and provision of chronic care management services.    SDOH (Social Determinants of Health) assessments performed: No; risk for depression; risk for stress; risk for tobacco use; risk for financial strain;   Flowsheet Row Chronic Care Management from 02/08/2020 in [facility]  PHQ-9 Total Score 4      GAD 7 : Generalized Anxiety Score 02/08/2020 01/17/2020 10/14/2019 04/04/2019  Nervous, Anxious, on Edge 1 1 1 2   Control/stop worrying 0 2 1 1   Worry too much - different things 0 2 2 2   Trouble relaxing 0 1 1 1   Restless 0 0 0 0  Easily annoyed or irritable 0 3 1 2   Afraid - awful might happen 0 0 0 0  Total GAD 7 Score 1 9 6 8   Anxiety Difficulty Somewhat difficult - - Somewhat difficult    Outpatient Encounter Medications as of 06/26/2020  Medication Sig   acetaminophen-codeine (TYLENOL #3) 300-30 MG tablet TAKE 1 TABLET BY MOUTH AT BEDTIME AS NEEDED FOR MODERATE PAIN.   Ascorbic Acid (VITAMIN C) 1000 MG tablet Take 1,000 mg by mouth daily.   aspirin EC 81 MG tablet Take 162 mg by mouth daily.    Cholecalciferol (VITAMIN D) 2000 UNITS tablet Take 2,000 Units by mouth daily.   CINNAMON PO Take by mouth.   diclofenac (VOLTAREN) 50 MG EC tablet Take 1 tablet (50 mg total) by mouth 2 (two) times daily.   escitalopram (LEXAPRO) 10 MG tablet TAKE 1 TABLET BY MOUTH EVERY DAY   fluticasone (FLONASE) 50 MCG/ACT nasal spray Place 2 sprays into  both nostrils daily.   furosemide (LASIX) 20 MG tablet Take 1 tablet (20 mg total) by mouth daily. (Patient taking differently: Take 40 mg by mouth daily.)   GARLIC PO Take by mouth.   Multiple Vitamin (MULTIVITAMIN WITH MINERALS) TABS Take 1 tablet by mouth daily.   OVER THE COUNTER MEDICATION zinc   OVER THE COUNTER MEDICATION Flaxseed oil   Facility-Administered Encounter Medications as of 06/26/2020  Medication   sodium chloride irrigation 0.9 %    Goals      Client will communicate with LCSW in next 30 days to discuss medical challenges of client and to discuss daily activities completion of client (pt-stated)      CARE PLAN ENTRY   Current Barriers:   Patient with chronic diagnoses of Metabolic Syndrome, Hx of Pulmonary embolus, Depression, Dyspnea on Exertion, Morbid Obesity, Type 2 DM, Hx of DVT  Clinical Social Work Clinical Goal(s):   LCSW to call client in next 30 days to discuss medical challenges of client and to discuss client completion of daily activities  Interventions:   Talked with client about his use of compression hose (he said he has two pairs of compression hose he wears)  Talked with client about CCM program  Talked with client about mobility of client  Talked with client about RNCM support with CCM program  Talked with client about medication procurement of client  Talked with client about pain issues of client  Talked with client about vision of client  Talked with client about meal provision of client  Talked with client about transport needs of client   Talked with client about upcoming client medical appointments  Talked with client about relaxation techniques (karate, martial arts, walking outside)  Talked with client about social support (support from his sister,support from his father)  Talked with client about client completion of ADLs  Talked with client about his use of diuretic (has occasional swelling in legs)  Talked  with client about Meals on Wheels support (LCSW gave client the number to call for Meals on Wheels support)  Talked with client about sleeping issues of client  Talked with client about weight challenges of client (client has challenges with weight although he is exercising, is watching his diet and does take diuretic as prescribed)  Talked with client about his use of [employer] card provided through his insurance  Collaborated with RNCM regarding weight challenges of client   Patient Self Care Activities:   Prepares meals as needed Drives car as needed to appointments or to complete errands Takes medications as prescribed   Patient Self Care Deficits:  Mobility challenges    Initial goal documentation     Follow Up Plan:LCSW to call client in next 30 days to discuss medical challenges of client and to discuss client completion of daily activities  Kelton Pillar.Taner Rzepka MSW, LCSW Licensed Clinical Social Worker [facility] Care Management [phone]

## 2020-07-10 ENCOUNTER — Other Ambulatory Visit: Payer: Self-pay

## 2020-07-10 ENCOUNTER — Ambulatory Visit (INDEPENDENT_AMBULATORY_CARE_PROVIDER_SITE_OTHER): Payer: Medicare Other | Admitting: Family Medicine

## 2020-07-10 ENCOUNTER — Encounter: Payer: Self-pay | Admitting: Family Medicine

## 2020-07-10 VITALS — BP 121/71 | HR 71 | Temp 97.9°F | Ht 69.0 in | Wt >= 6400 oz

## 2020-07-10 DIAGNOSIS — M25562 Pain in left knee: Secondary | ICD-10-CM

## 2020-07-10 DIAGNOSIS — E119 Type 2 diabetes mellitus without complications: Secondary | ICD-10-CM | POA: Diagnosis not present

## 2020-07-10 DIAGNOSIS — R609 Edema, unspecified: Secondary | ICD-10-CM | POA: Diagnosis not present

## 2020-07-10 DIAGNOSIS — L84 Corns and callosities: Secondary | ICD-10-CM

## 2020-07-10 DIAGNOSIS — Z79899 Other long term (current) drug therapy: Secondary | ICD-10-CM

## 2020-07-10 DIAGNOSIS — F339 Major depressive disorder, recurrent, unspecified: Secondary | ICD-10-CM

## 2020-07-10 DIAGNOSIS — E781 Pure hyperglyceridemia: Secondary | ICD-10-CM

## 2020-07-10 DIAGNOSIS — M545 Low back pain, unspecified: Secondary | ICD-10-CM

## 2020-07-10 DIAGNOSIS — G8929 Other chronic pain: Secondary | ICD-10-CM | POA: Diagnosis not present

## 2020-07-10 LAB — BAYER DCA HB A1C WAIVED: HB A1C (BAYER DCA - WAIVED): 6.7 % (ref <7.0)

## 2020-07-10 MED ORDER — FUROSEMIDE 20 MG PO TABS: 20.0000 mg | ORAL_TABLET | Freq: Every day | ORAL | 1 refills | Status: DC

## 2020-07-10 MED ORDER — ESCITALOPRAM OXALATE 10 MG PO TABS
10.0000 mg | ORAL_TABLET | Freq: Every day | ORAL | 1 refills | Status: DC
Start: 2020-07-10 — End: 2021-02-24

## 2020-07-10 MED ORDER — ACETAMINOPHEN-CODEINE #3 300-30 MG PO TABS: ORAL_TABLET | ORAL | 2 refills | Status: DC

## 2020-07-10 MED ORDER — DICLOFENAC SODIUM 50 MG PO TBEC: 50.0000 mg | DELAYED_RELEASE_TABLET | Freq: Two times a day (BID) | ORAL | 2 refills | Status: DC

## 2020-07-10 MED ORDER — OZEMPIC (0.25 OR 0.5 MG/DOSE) 2 MG/1.5ML ~~LOC~~ SOPN
0.2500 mg | PEN_INJECTOR | SUBCUTANEOUS | 0 refills | Status: DC
Start: 2020-07-10 — End: 2020-08-13

## 2020-07-10 NOTE — Patient Instructions (Signed)
Miracle Repair Foot Cream

## 2020-07-10 NOTE — Progress Notes (Signed)
Assessment & Plan:  1. Type 2 diabetes mellitus without complication, without long-term current use of insulin (HCC) A1c 6.7 today.  - Diabetes is at goal of A1c < 7. - Medications: not previously on medication. Started Ozempic today. - Patient is not currently taking a statin. Patient is not taking an ACE-inhibitor/ARB.  - Instruction/counseling given: reminded to get eye exam and discussed foot care  Diabetes Health Maintenance Due  Topic Date Due  . OPHTHALMOLOGY EXAM  Never done  . URINE MICROALBUMIN  04/19/2020  . HEMOGLOBIN A1C  10/16/2020  . FOOT EXAM  07/10/2021    Lab Results  Component Value Date   LABMICR 3.7 04/20/2019   - Lipid panel - CBC with Differential/Platelet - CMP14+EGFR - Bayer DCA Hb A1c Waived - Microalbumin / creatinine urine ratio - Semaglutide,0.25 or 0.5MG/DOS, (OZEMPIC, 0.25 OR 0.5 MG/DOSE,) 2 MG/1.5ML SOPN; Inject 0.25 mg into the skin once a week.  Dispense: 1.5 mL; Refill: 0  2. Hypertriglyceridemia Well controlled on current regimen.  - Lipid panel - CBC with Differential/Platelet - CMP14+EGFR  3. Chronic pain of left knee Well controlled on current regimen.  - CMP14+EGFR - acetaminophen-codeine (TYLENOL #3) 300-30 MG tablet; TAKE 1 TABLET BY MOUTH AT BEDTIME AS NEEDED FOR MODERATE PAIN.  Dispense: 30 tablet; Refill: 2 - diclofenac (VOLTAREN) 50 MG EC tablet; Take 1 tablet (50 mg total) by mouth 2 (two) times daily.  Dispense: 180 tablet; Refill: 2  4. Chronic midline low back pain without sciatica Well controlled on current regimen.  - CMP14+EGFR - acetaminophen-codeine (TYLENOL #3) 300-30 MG tablet; TAKE 1 TABLET BY MOUTH AT BEDTIME AS NEEDED FOR MODERATE PAIN.  Dispense: 30 tablet; Refill: 2 - diclofenac (VOLTAREN) 50 MG EC tablet; Take 1 tablet (50 mg total) by mouth 2 (two) times daily.  Dispense: 180 tablet; Refill: 2 - ToxASSURE Select 13 (MW), Urine  5. Controlled substance agreement signed Controlled substance agreement in  place.  Urine drug screen collected today.  PDMP reviewed with no concerning findings. - acetaminophen-codeine (TYLENOL #3) 300-30 MG tablet; TAKE 1 TABLET BY MOUTH AT BEDTIME AS NEEDED FOR MODERATE PAIN.  Dispense: 30 tablet; Refill: 2 - ToxASSURE Select 13 (MW), Urine  6. Depression, recurrent ([place]) Well controlled on current regimen.  - CMP14+EGFR - escitalopram (LEXAPRO) 10 MG tablet; Take 1 tablet (10 mg total) by mouth daily.  Dispense: 90 tablet; Refill: 1  7. Callus of heel Encouraged to keep an eye on his feet.  Discussed we can refer to podiatry when he wishes if he continues to have issues.  Recommended Miracle Repair Foot Cream.  8. Morbid obesity ([place]) Continue diet and exercise.  Started patient on Ozempic today. - Lipid panel - CBC with Differential/Platelet - CMP14+EGFR - Semaglutide,0.25 or 0.5MG/DOS, (OZEMPIC, 0.25 OR 0.5 MG/DOSE,) 2 MG/1.5ML SOPN; Inject 0.25 mg into the skin once a week.  Dispense: 1.5 mL; Refill: 0  9. Swelling Well controlled on current regimen.  - CMP14+EGFR - furosemide (LASIX) 20 MG tablet; Take 1 tablet (20 mg total) by mouth daily.  Dispense: 90 tablet; Refill: 1   Return in about 6 weeks (around 08/21/2020) for weight.  [name], MSN, APRN, FNP-C [facility]  Subjective:    Patient ID: Justin Schmidt, male    DOB: 11/21/1980, 40 y.o.   MRN: [id number]  Patient Care Team: Loman Brooklyn, FNP as PCP - General (Family Medicine) Ilean China, RN as Registered Nurse Blanca Friend, Royce Macadamia, [facility] (Pharmacist) Shea Evans,  Norva Riffle, LCSW as Education officer, museum (Licensed Holiday representative)   Chief Complaint:  Chief Complaint  Patient presents with  . Diabetes    3 month follow up of chronic medical conditions   . Weight Loss    HPI: Justin Schmidt is a 40 y.o. male presenting on 07/10/2020 for Diabetes (3 month follow up of chronic medical conditions ) and Weight Loss  Patient is accompanied by his father who he is  okay with being present.  Pain assessment: Cause of pain- arthritis and morbid obesity Pain location- low back and left knee Pain on scale of 1-10- 6-8/10 Frequency- daily What increases pain- activity What makes pain better- rest, medication Effects on ADL- limits at times Any change in general medical condition- none  Current opioids rx- Tylenol #3 QHS PRN # meds rx- 30 Effectiveness of current meds- good  Adverse reactions from pain meds- none Morphine equivalent- 4.50 MME/day  Pill count performed-No Last drug screen - 03/2019 ( high risk q63m moderate risk q693mlow risk yearly ) Urine drug screen today- Yes Was [place]- Yes  If yes were their any concerning findings? - No  Overdose risk: 80  Opioid Risk  04/20/2019  Alcohol 0  Illegal Drugs 0  Rx Drugs 0  Alcohol 0  Illegal Drugs 0  Rx Drugs 0  Age between 16-45 years  1  History of Preadolescent Sexual Abuse 0  Psychological Disease 0  Depression 1  Opioid Risk Tool Scoring 2  Opioid Risk Interpretation Low Risk   Pain contract signed on:10/14/2019   Diabetes: Patient presents for follow up of diabetes. Current symptoms include: none. Known diabetic complications: none. Medication compliance: not on medications at this time. Current diet: in general, a "healthy" diet  . Current exercise: walking. Is he  on ACE inhibitor or angiotensin II receptor blocker? No. Is he on a statin? No.   Morbid Obesity: patient is eating healthy and exercising and feels like he has hit a brick [place] with weight loss.  New complaints: None  Social history:  Relevant past medical, surgical, family and social history reviewed and updated as indicated. Interim medical history since our last visit reviewed.  Allergies and medications reviewed and updated.  DATA REVIEWED: CHART IN EPIC  ROS: Negative unless specifically indicated above in HPI.    Current Outpatient Medications:  .  acetaminophen-codeine (TYLENOL  #3) 300-30 MG tablet, TAKE 1 TABLET BY MOUTH AT BEDTIME AS NEEDED FOR MODERATE PAIN., Disp: 30 tablet, Rfl: 2 .  Ascorbic Acid (VITAMIN C) 1000 MG tablet, Take 1,000 mg by mouth daily., Disp: , Rfl:  .  aspirin EC 81 MG tablet, Take 162 mg by mouth daily. , Disp: , Rfl:  .  Cholecalciferol (VITAMIN D) 2000 UNITS tablet, Take 2,000 Units by mouth daily., Disp: , Rfl:  .  CINNAMON PO, Take by mouth., Disp: , Rfl:  .  diclofenac (VOLTAREN) 50 MG EC tablet, Take 1 tablet (50 mg total) by mouth 2 (two) times daily., Disp: 180 tablet, Rfl: 2 .  escitalopram (LEXAPRO) 10 MG tablet, TAKE 1 TABLET BY MOUTH EVERY DAY, Disp: 90 tablet, Rfl: 0 .  fluticasone (FLONASE) 50 MCG/ACT nasal spray, Place 2 sprays into both nostrils daily., Disp: 48 g, Rfl: 4 .  furosemide (LASIX) 20 MG tablet, Take 1 tablet (20 mg total) by mouth daily. (Patient taking differently: Take 40 mg by mouth daily.), Disp: 90 tablet, Rfl: 1 .  GARLIC PO, Take by mouth., Disp: ,  Rfl:  .  Multiple Vitamin (MULTIVITAMIN WITH MINERALS) TABS, Take 1 tablet by mouth daily., Disp: , Rfl:  .  OVER THE COUNTER MEDICATION, zinc, Disp: , Rfl:  .  OVER THE COUNTER MEDICATION, Flaxseed oil, Disp: , Rfl:  No current facility-administered medications for this visit.  Facility-Administered Medications Ordered in Other Visits:  .  sodium chloride irrigation 0.9 %, , , PRN, Chelsea Primus, MD, 3,000 mL at 01/27/12 0739   Allergies  Allergen Reactions  . Metformin And Related     diarrhea  . Xarelto [Rivaroxaban]     DROWSINESS, FATIGUE   Past Medical History:  Diagnosis Date  . Acute saddle pulmonary embolism without acute cor pulmonale ([place]) 01/30/2017  . Back pain   . Gallstones   . Hyperlipidemia   . Type 2 diabetes mellitus without complication, without long-term current use of insulin ([place]) 08/11/2018  . VTE (venous thromboembolism) 05/14/2017    Past Surgical History:  Procedure Laterality Date  . APPENDECTOMY  1991  . CHOLECYSTECTOMY   01/23/2012   Procedure: LAPAROSCOPIC CHOLECYSTECTOMY;  Surgeon: Donato Heinz, MD;  Location: [facility];  Service: General;  Laterality: N/A;  . TONSILLECTOMY      Social History   Socioeconomic History  . Marital status: Single    Spouse name: Not on file  . Number of children: Not on file  . Years of education: 12th grade  . Highest education level: High school graduate  Occupational History  . Not on file  Tobacco Use  . Smoking status: Never Smoker  . Smokeless tobacco: Never Used  Vaping Use  . Vaping Use: Never used  Substance and Sexual Activity  . Alcohol use: Yes    Alcohol/week: 1.0 standard drink    Types: 1 Cans of beer per week    Comment: very rare  . Drug use: No  . Sexual activity: Never  Other Topics Concern  . Not on file  Social History Narrative  . Not on file   Social Determinants of Health   Financial Resource Strain: Not on file  Food Insecurity: Not on file  Transportation Needs: Not on file  Physical Activity: Not on file  Stress: Not on file  Social Connections: Not on file  Intimate Partner Violence: Not on file        Objective:    BP 121/71   Pulse 71   Temp 97.9 F (36.6 C) (Temporal)   Ht '5\' 9"'  (1.753 m)   Wt (!) 438 lb 6.4 oz (198.9 kg)   SpO2 97%   BMI 64.74 kg/m   Wt Readings from Last 3 Encounters:  07/10/20 (!) 438 lb 6.4 oz (198.9 kg)  04/17/20 (!) 431 lb 4 oz (195.6 kg)  01/17/20 (!) 437 lb 6.4 oz (198.4 kg)    Physical Exam Vitals reviewed.  Constitutional:      General: He is not in acute distress.    Appearance: Normal appearance. He is morbidly obese. He is not ill-appearing, toxic-appearing or diaphoretic.  HENT:     Head: Normocephalic and atraumatic.  Eyes:     General: No scleral icterus.       Right eye: No discharge.        Left eye: No discharge.     Conjunctiva/sclera: Conjunctivae normal.  Cardiovascular:     Rate and Rhythm: Normal rate and regular rhythm.     Heart sounds: Normal heart  sounds. No murmur heard. No friction rub. No gallop.   Pulmonary:  Effort: Pulmonary effort is normal. No respiratory distress.     Breath sounds: Normal breath sounds. No stridor. No wheezing, rhonchi or rales.  Musculoskeletal:        General: Normal range of motion.     Cervical back: Normal range of motion.  Skin:    General: Skin is warm and dry.  Neurological:     Mental Status: He is alert and oriented to person, place, and time. Mental status is at baseline.  Psychiatric:        Mood and Affect: Mood normal.        Behavior: Behavior normal.        Thought Content: Thought content normal.        Judgment: Judgment normal.    Diabetic Foot Exam - Simple   Simple Foot Form Diabetic Foot exam was performed with the following findings: Yes 07/10/2020  3:04 PM  Visual Inspection No deformities, no ulcerations, no other skin breakdown bilaterally: Yes See comments: Yes Sensation Testing Intact to touch and monofilament testing bilaterally: Yes Pulse Check Posterior Tibialis and Dorsalis pulse intact bilaterally: Yes Comments Patient does have very thick heels and states they sometimes crack.     Lab Results  Component Value Date   TSH 1.540 08/22/2016   Lab Results  Component Value Date   WBC 6.2 04/17/2020   HGB 15.2 04/17/2020   HCT 44.0 04/17/2020   MCV 91 04/17/2020   PLT 291 04/17/2020   Lab Results  Component Value Date   NA 143 04/17/2020   K 5.1 04/17/2020   CO2 26 04/17/2020   GLUCOSE 104 (H) 04/17/2020   BUN 12 04/17/2020   CREATININE 1.13 04/17/2020   BILITOT 0.3 04/17/2020   ALKPHOS 75 04/17/2020   AST 25 04/17/2020   ALT 50 (H) 04/17/2020   PROT 7.3 04/17/2020   ALBUMIN 4.4 04/17/2020   CALCIUM 9.7 04/17/2020   ANIONGAP 6 08/12/2017   Lab Results  Component Value Date   CHOL 167 04/17/2020   Lab Results  Component Value Date   HDL 46 04/17/2020   Lab Results  Component Value Date   LDLCALC 104 (H) 04/17/2020   Lab Results   Component Value Date   TRIG 92 04/17/2020   Lab Results  Component Value Date   CHOLHDL 3.6 04/17/2020   Lab Results  Component Value Date   HGBA1C 6.7 04/17/2020

## 2020-07-11 LAB — MICROALBUMIN / CREATININE URINE RATIO
Creatinine, Urine: 88 mg/dL
Microalb/Creat Ratio: <3 mg/g creat (ref 0–29)
Microalbumin, Urine: <3 ug/mL

## 2020-07-11 LAB — CMP14+EGFR
ALT: 45 IU/L — ABNORMAL HIGH (ref 0–44)
AST: 24 IU/L (ref 0–40)
Albumin/Globulin Ratio: 1.6 (ref 1.2–2.2)
Albumin: 4.3 g/dL (ref 4.0–5.0)
Alkaline Phosphatase: 71 IU/L (ref 44–121)
BUN/Creatinine Ratio: 9 (ref 9–20)
BUN: 11 mg/dL (ref 6–24)
Bilirubin Total: 0.3 mg/dL (ref 0.0–1.2)
CO2: 23 mmol/L (ref 20–29)
Calcium: 9.4 mg/dL (ref 8.7–10.2)
Chloride: 106 mmol/L (ref 96–106)
Creatinine, Ser: 1.19 mg/dL (ref 0.76–1.27)
Globulin, Total: 2.7 g/dL (ref 1.5–4.5)
Glucose: 108 mg/dL — ABNORMAL HIGH (ref 65–99)
Potassium: 5.1 mmol/L (ref 3.5–5.2)
Sodium: 143 mmol/L (ref 134–144)
Total Protein: 7 g/dL (ref 6.0–8.5)
eGFR: 79 mL/min/{1.73_m2} (ref >59)

## 2020-07-11 LAB — LIPID PANEL
Chol/HDL Ratio: 3.8 ratio (ref 0.0–5.0)
Cholesterol, Total: 169 mg/dL (ref 100–199)
HDL: 44 mg/dL (ref >39)
LDL Chol Calc (NIH): 109 mg/dL — ABNORMAL HIGH (ref 0–99)
Triglycerides: 87 mg/dL (ref 0–149)
VLDL Cholesterol Cal: 16 mg/dL (ref 5–40)

## 2020-07-11 LAB — CBC WITH DIFFERENTIAL/PLATELET
Basophils Absolute: 0.1 10*3/uL (ref 0.0–0.2)
Basos: 1 %
EOS (ABSOLUTE): 0.1 10*3/uL (ref 0.0–0.4)
Eos: 2 %
Hematocrit: 43.1 % (ref 37.5–51.0)
Hemoglobin: 14.4 g/dL (ref 13.0–17.7)
Immature Grans (Abs): 0 10*3/uL (ref 0.0–0.1)
Immature Granulocytes: 0 %
Lymphocytes Absolute: 1.3 10*3/uL (ref 0.7–3.1)
Lymphs: 21 %
MCH: 31.1 pg (ref 26.6–33.0)
MCHC: 33.4 g/dL (ref 31.5–35.7)
MCV: 93 fL (ref 79–97)
Monocytes Absolute: 0.4 10*3/uL (ref 0.1–0.9)
Monocytes: 7 %
Neutrophils Absolute: 4.3 10*3/uL (ref 1.4–7.0)
Neutrophils: 69 %
Platelets: 244 10*3/uL (ref 150–450)
RBC: 4.63 x10E6/uL (ref 4.14–5.80)
RDW: 13.2 % (ref 11.6–15.4)
WBC: 6.2 10*3/uL (ref 3.4–10.8)

## 2020-07-18 ENCOUNTER — Ambulatory Visit: Payer: Medicare Other | Admitting: Family Medicine

## 2020-07-19 ENCOUNTER — Encounter: Payer: Self-pay | Admitting: *Deleted

## 2020-07-19 LAB — TOXASSURE SELECT 13 (MW), URINE

## 2020-07-27 ENCOUNTER — Ambulatory Visit (INDEPENDENT_AMBULATORY_CARE_PROVIDER_SITE_OTHER): Payer: Medicare Other | Admitting: Licensed Clinical Social Worker

## 2020-07-27 DIAGNOSIS — F339 Major depressive disorder, recurrent, unspecified: Secondary | ICD-10-CM | POA: Diagnosis not present

## 2020-07-27 DIAGNOSIS — E8881 Metabolic syndrome: Secondary | ICD-10-CM

## 2020-07-27 DIAGNOSIS — E119 Type 2 diabetes mellitus without complications: Secondary | ICD-10-CM

## 2020-07-27 DIAGNOSIS — R0609 Other forms of dyspnea: Secondary | ICD-10-CM

## 2020-07-27 DIAGNOSIS — Z86718 Personal history of other venous thrombosis and embolism: Secondary | ICD-10-CM

## 2020-07-27 DIAGNOSIS — Z86711 Personal history of pulmonary embolism: Secondary | ICD-10-CM

## 2020-07-27 DIAGNOSIS — R0600 Dyspnea, unspecified: Secondary | ICD-10-CM

## 2020-07-27 NOTE — Patient Instructions (Signed)
Visit Information  PATIENT GOALS: Goals Addressed            This Visit's Progress   . Manage Health issues faced       Timeframe:  Short-Term Goal Priority:  Medium Progress: On Track Start Date:             07/27/09                Expected End Date:           10/26/20            Follow Up Date 08/31/20   Protect My Health (Patient)   Manage Health issues faced    Why is this important?    Screening tests can find diseases early when they are easier to treat.   Your doctor or nurse will talk with you about which tests are important for you.   Getting shots for common diseases like the flu and shingles will help prevent them.   Patient Strengths/Coping Skills: Exercises regularly Tries to eat a health diet  Patient Deficits Some Pain and mobility challenges  Patient Goals:  Over next 30 days, client will talk with LCSW about his coping strategies to manage his current health needs Over next 30 days , patient will attend all scheduled client medical appointments Over next 30 days, client will call RNCM or LCSW as needed for CCM support -  Follow Up Plan:  LCSW to call client on 08/31/20 to assess client needs      Kelton Pillar.Shreya Lacasse MSW, LCSW Licensed Clinical Social Worker [facility] Care Management [phone]

## 2020-07-27 NOTE — Chronic Care Management (AMB) (Signed)
Chronic Care Management    Clinical Social Work Note  07/27/2020 Name: Justin Schmidt MRN: [id number] DOB: 01-Aug-1980  Justin Schmidt is a 40 y.o. year old male who is a primary care patient of Gwenlyn Fudge, FNP. The CCM team was consulted to assist the patient with chronic disease management and/or care coordination needs related to: [employer] .   Engaged with patient by telephone for follow up visit in response to provider referral for social work chronic care management and care coordination services.   Consent to Services:  The patient was given information about Chronic Care Management services, agreed to services, and gave verbal consent prior to initiation of services.  Please see initial visit note for detailed documentation.   Patient agreed to services and consent obtained.   Assessment: Review of patient past medical history, allergies, medications, and health status, including review of relevant consultants reports was performed today as part of a comprehensive evaluation and provision of chronic care management and care coordination services.     SDOH (Social Determinants of Health) assessments and interventions performed:    Advanced Directives Status: See Vynca application for related entries.  CCM Care Plan  Allergies  Allergen Reactions  . Metformin And Related     diarrhea  . Xarelto [Rivaroxaban]     DROWSINESS, FATIGUE    Outpatient Encounter Medications as of 07/27/2020  Medication Sig  . acetaminophen-codeine (TYLENOL #3) 300-30 MG tablet TAKE 1 TABLET BY MOUTH AT BEDTIME AS NEEDED FOR MODERATE PAIN.  Marland Kitchen Ascorbic Acid (VITAMIN C) 1000 MG tablet Take 1,000 mg by mouth daily.  Marland Kitchen aspirin EC 81 MG tablet Take 162 mg by mouth daily.   . Cholecalciferol (VITAMIN D) 2000 UNITS tablet Take 2,000 Units by mouth daily.  Marland Kitchen CINNAMON PO Take by mouth.  . diclofenac (VOLTAREN) 50 MG EC tablet Take 1 tablet (50 mg total) by mouth 2 (two) times daily.  Marland Kitchen  escitalopram (LEXAPRO) 10 MG tablet Take 1 tablet (10 mg total) by mouth daily.  . fluticasone (FLONASE) 50 MCG/ACT nasal spray Place 2 sprays into both nostrils daily.  . furosemide (LASIX) 20 MG tablet Take 1 tablet (20 mg total) by mouth daily.  Marland Kitchen GARLIC PO Take by mouth.  . Multiple Vitamin (MULTIVITAMIN WITH MINERALS) TABS Take 1 tablet by mouth daily.  Marland Kitchen OVER THE COUNTER MEDICATION zinc  . OVER THE COUNTER MEDICATION Flaxseed oil  . Semaglutide,0.25 or 0.5MG /DOS, (OZEMPIC, 0.25 OR 0.5 MG/DOSE,) 2 MG/1.5ML SOPN Inject 0.25 mg into the skin once a week.   Facility-Administered Encounter Medications as of 07/27/2020  Medication  . sodium chloride irrigation 0.9 %    Patient Active Problem List   Diagnosis Date Noted  . Chronic pain of left knee 07/10/2020  . History of pulmonary embolus (PE) 11/08/2018  . History of DVT (deep vein thrombosis) 11/08/2018  . Type 2 diabetes mellitus without complication, without long-term current use of insulin (HCC) 08/11/2018  . Dyspnea on exertion 01/30/2017  . Morbid obesity (HCC) 01/30/2017  . Controlled substance agreement signed 09/11/2016  . Chronic back pain 09/11/2016  . Depression, recurrent (HCC) 09/11/2016  . Fatty liver 05/17/2015  . Elevated transaminase level 05/17/2015  . Metabolic syndrome 02/01/2015  . Hypertriglyceridemia 09/05/2013    Conditions to be addressed/monitored: Monitor client management of ongoing health needs   Care Plan : LCSW Care Plan  Updates made by Isaiah Blakes, LCSW since 07/27/2020 12:00 AM    Problem: Coping Skills (General Plan  of Care)     Goal: Manage Health needs faced   Start Date: 07/27/2020  Expected End Date: 10/26/2020  This Visit's Progress: On track  Priority: Medium  Note:   Current barriers:   . Patient in need of assistance with connecting to community resources for possible help in managing his ongoing medical needs . Patient is unable to independently navigate community resource  options without care coordination support .  Clinical Goals:  patient will work with SW  in next 30 days to address concerns related to client management of his current health conditions Client to talk with LCSW in next 30 days about mobility issues of client and pain issues of client   Clinical Interventions:  . Collaboration with Gwenlyn Fudge, FNP regarding development and update of comprehensive plan of care as evidenced by provider attestation and co-signature . Assessment of needs, barriers of client  .  Talked with client about pain issues of client (occasional back pain) . Talked with client about mood of client . Talked with client about exercise of client . Talked with client about sleeping issues of client . Talked with client about transport needs of client . Talked with client about upcoming client medical appointments . Talked with client about his managing Diabetes symptoms . Encouraged client to call RNCM as needed for nursing support . Talked with client about relaxation techniques (karate, weightlifting, walking) . Talked with client about family support (father, sister)  Patient Strengths/Coping Skills: Exercises regularly Tries to eat a health diet  Patient Deficits Some Pain and mobility challenges  Patient Goals:  Over next 30 days, client will talk with LCSW about his coping strategies to manage his current health needs Over next 30 days , patient will attend all scheduled client medical appointments Over next 30 days, client will call RNCM or LCSW as needed for CCM support -  Follow Up Plan:  LCSW to call client on 08/31/20 to assess client needs    Kelton Pillar.Marq Rebello MSW, LCSW Licensed Clinical Social Worker [facility] Care Management [phone]

## 2020-08-10 ENCOUNTER — Other Ambulatory Visit: Payer: Self-pay | Admitting: Family Medicine

## 2020-08-10 DIAGNOSIS — E119 Type 2 diabetes mellitus without complications: Secondary | ICD-10-CM

## 2020-08-17 ENCOUNTER — Other Ambulatory Visit: Payer: Self-pay

## 2020-08-17 ENCOUNTER — Ambulatory Visit: Payer: Medicare Other | Admitting: Family Medicine

## 2020-08-24 ENCOUNTER — Ambulatory Visit: Payer: Medicare Other | Admitting: Family Medicine

## 2020-08-31 ENCOUNTER — Telehealth: Payer: Medicare Other

## 2020-10-11 ENCOUNTER — Telehealth: Payer: Medicare Other

## 2020-10-29 DIAGNOSIS — R319 Hematuria, unspecified: Secondary | ICD-10-CM | POA: Diagnosis not present

## 2020-10-29 DIAGNOSIS — Z9049 Acquired absence of other specified parts of digestive tract: Secondary | ICD-10-CM | POA: Diagnosis not present

## 2020-10-29 DIAGNOSIS — Z87442 Personal history of urinary calculi: Secondary | ICD-10-CM | POA: Diagnosis not present

## 2020-11-01 ENCOUNTER — Telehealth: Payer: Medicare Other

## 2020-11-22 ENCOUNTER — Ambulatory Visit (INDEPENDENT_AMBULATORY_CARE_PROVIDER_SITE_OTHER): Payer: Medicare Other | Admitting: Family Medicine

## 2020-11-22 DIAGNOSIS — U071 COVID-19: Secondary | ICD-10-CM | POA: Diagnosis not present

## 2020-11-22 MED ORDER — MOLNUPIRAVIR EUA 200MG CAPSULE: 4.0000 | ORAL_CAPSULE | Freq: Two times a day (BID) | ORAL | 0 refills | Status: AC

## 2020-11-22 MED ORDER — DEXAMETHASONE 6 MG PO TABS: 6.0000 mg | ORAL_TABLET | Freq: Two times a day (BID) | ORAL | 0 refills | Status: DC

## 2020-11-22 NOTE — Progress Notes (Signed)
Subjective:    Patient ID: Justin Schmidt, male    DOB: June 17, 1980, 40 y.o.   MRN: [id number]   HPI: Justin Schmidt is a 40 y.o. male presenting for covid positive test. Sx started about a week ago.Symptoms achy, bad cough, sore throat, light headed, subjective fever. Decreased appetite. Denies dyspnea.    Depression screen [facility] 2/9 07/10/2020 04/17/2020 02/08/2020 01/17/2020 10/14/2019  Decreased Interest 0 0 1 1 1   Down, Depressed, Hopeless 0 0 1 1 2   PHQ - 2 Score 0 0 2 2 3   Altered sleeping 0 - 0 0 2  Tired, decreased energy 1 - 1 0 1  Change in appetite 2 - 0 1 1  Feeling bad or failure about yourself  1 - 0 1 1  Trouble concentrating 0 - 0 0 0  Moving slowly or fidgety/restless 0 - 1 0 0  Suicidal thoughts 0 - 0 0 1  PHQ-9 Score 4 - 4 4 9   Difficult doing work/chores Not difficult at all - Somewhat difficult - -  Some recent data might be hidden     Relevant past medical, surgical, family and social history reviewed and updated as indicated.  Interim medical history since our last visit reviewed. Allergies and medications reviewed and updated.  ROS:  Review of Systems  Constitutional:  Positive for fever. Negative for activity change, appetite change and chills.  HENT:  Positive for congestion, postnasal drip, rhinorrhea and sinus pressure. Negative for ear discharge, ear pain, hearing loss, nosebleeds, sneezing and trouble swallowing.   Respiratory:  Negative for chest tightness and shortness of breath.   Cardiovascular:  Negative for chest pain and palpitations.  Musculoskeletal:  Negative for arthralgias.  Skin:  Negative for rash.    Social History   Tobacco Use  Smoking Status Never  Smokeless Tobacco Never       Objective:     Wt Readings from Last 3 Encounters:  07/10/20 (!) 438 lb 6.4 oz (198.9 kg)  04/17/20 (!) 431 lb 4 oz (195.6 kg)  01/17/20 (!) 437 lb 6.4 oz (198.4 kg)     Exam deferred. Pt. Harboring due to COVID 19. Phone visit performed.    Assessment & Plan:   1. COVID-19 virus infection     Meds ordered this encounter  Medications   dexamethasone (DECADRON) 6 MG tablet    Sig: Take 1 tablet (6 mg total) by mouth 2 (two) times daily with a meal.    Dispense:  10 tablet    Refill:  0   molnupiravir EUA 200 mg CAPS    Sig: Take 4 capsules (800 mg total) by mouth 2 (two) times daily for 5 days.    Dispense:  40 capsule    Refill:  0    No orders of the defined types were placed in this encounter.     Diagnoses and all orders for this visit:  COVID-19 virus infection  Other orders -     dexamethasone (DECADRON) 6 MG tablet; Take 1 tablet (6 mg total) by mouth 2 (two) times daily with a meal. -     molnupiravir EUA 200 mg CAPS; Take 4 capsules (800 mg total) by mouth 2 (two) times daily for 5 days.   Virtual Visit via telephone Note  I discussed the limitations, risks, security and privacy concerns of performing an evaluation and management service by telephone and the availability of in person appointments. The patient was identified with two identifiers. Pt.expressed  understanding and agreed to proceed. Pt. Is at home. Dr. Darlyn Read is in his office.  Follow Up Instructions:   I discussed the assessment and treatment plan with the patient. The patient was provided an opportunity to ask questions and all were answered. The patient agreed with the plan and demonstrated an understanding of the instructions.   The patient was advised to call back or seek an in-person evaluation if the symptoms worsen or if the condition fails to improve as anticipated.   Total minutes including chart review and phone contact time: 11  Follow up plan: Return if symptoms worsen or fail to improve.  Mechele Claude, MD [place] [facility]

## 2020-11-23 ENCOUNTER — Telehealth: Payer: Medicare Other

## 2020-11-30 ENCOUNTER — Ambulatory Visit (INDEPENDENT_AMBULATORY_CARE_PROVIDER_SITE_OTHER): Payer: Medicare Other | Admitting: Nurse Practitioner

## 2020-11-30 DIAGNOSIS — R059 Cough, unspecified: Secondary | ICD-10-CM | POA: Diagnosis not present

## 2020-11-30 MED ORDER — BENZONATATE 100 MG PO CAPS: 100.0000 mg | ORAL_CAPSULE | Freq: Three times a day (TID) | ORAL | 0 refills | Status: DC | PRN

## 2020-11-30 MED ORDER — DM-GUAIFENESIN ER 30-600 MG PO TB12: 1.0000 | ORAL_TABLET | Freq: Two times a day (BID) | ORAL | 0 refills | Status: DC

## 2020-11-30 NOTE — Progress Notes (Signed)
   Virtual Visit  Note Due to COVID-19 pandemic this visit was conducted virtually. This visit type was conducted due to national recommendations for restrictions regarding the COVID-19 Pandemic (e.g. social distancing, sheltering in place) in an effort to limit this patient's exposure and mitigate transmission in our community. All issues noted in this document were discussed and addressed.  A physical exam was not performed with this format.  I connected with Justin Schmidt on 11/30/20 at 4:30 PM by telephone and verified that I am speaking with the correct person using two identifiers. Justin Schmidt is currently located at home during visit. The provider, Daryll Drown, NP is located in their office at time of visit.  I discussed the limitations, risks, security and privacy concerns of performing an evaluation and management service by telephone and the availability of in person appointments. I also discussed with the patient that there may be a patient responsible charge related to this service. The patient expressed understanding and agreed to proceed.   History and Present Illness:  Cough This is a recurrent problem. The current episode started in the past 7 days. The problem has been unchanged. The problem occurs constantly. The cough is Non-productive. Pertinent negatives include no chest pain, chills, ear congestion or rash. Nothing aggravates the symptoms. The treatment provided no relief.     Review of Systems  Constitutional:  Negative for chills.  Respiratory:  Positive for cough.   Cardiovascular:  Negative for chest pain.  Skin:  Negative for rash.  All other systems reviewed and are negative.   Observations/Objective: Televisit patient not in distress.  Assessment and Plan: Encourage patient to take medication as prescribed, increase hydration, humidifier, Benzonate 100 milligrams tablet by mouth, guaifenesin for cough and congestion Follow Up Instructions: Follow-up  with worsening unresolved symptoms.    I discussed the assessment and treatment plan with the patient. The patient was provided an opportunity to ask questions and all were answered. The patient agreed with the plan and demonstrated an understanding of the instructions.   The patient was advised to call back or seek an in-person evaluation if the symptoms worsen or if the condition fails to improve as anticipated.  The above assessment and management plan was discussed with the patient. The patient verbalized understanding of and has agreed to the management plan. Patient is aware to call the clinic if symptoms persist or worsen. Patient is aware when to return to the clinic for a follow-up visit. Patient educated on when it is appropriate to go to the emergency department.   Time call ended: 4:40 PM  I provided 10 minutes of  non face-to-face time during this encounter.    Daryll Drown, NP

## 2020-12-01 ENCOUNTER — Encounter: Payer: Self-pay | Admitting: Nurse Practitioner

## 2020-12-01 NOTE — Assessment & Plan Note (Signed)
Encourage patient to take medication as prescribed, increase hydration, humidifier, Benzonate 100 milligrams tablet by mouth, guaifenesin for cough and congestion

## 2020-12-13 ENCOUNTER — Ambulatory Visit (INDEPENDENT_AMBULATORY_CARE_PROVIDER_SITE_OTHER): Payer: Medicare Other

## 2020-12-13 VITALS — Ht 69.0 in | Wt >= 6400 oz

## 2020-12-13 DIAGNOSIS — Z0000 Encounter for general adult medical examination without abnormal findings: Secondary | ICD-10-CM

## 2020-12-13 NOTE — Patient Instructions (Signed)
Justin Schmidt , Thank you for taking time to come for your Medicare Wellness Visit. I appreciate your ongoing commitment to your health goals. Please review the following plan we discussed and let me know if I can assist you in the future.   Screening recommendations/referrals: Colonoscopy: Due at age 41 Recommended yearly ophthalmology/optometry visit for glaucoma screening and checkup Recommended yearly dental visit for hygiene and checkup  Vaccinations: Influenza vaccine: Due every fall Pneumococcal vaccine: Due after age 58 Tdap vaccine: Done 12/01/2017 - Repeat in 10 years Shingles vaccine: Due after age 54   Covid-19: Declined  Advanced directives: Please bring a copy of your health care power of attorney and living will to the office to be added to your chart at your convenience.   Conditions/risks identified: Aim for 30 minutes of exercise or brisk walking each day, drink 6-8 glasses of water and eat lots of fruits and vegetables.   Next appointment: Follow up in one year for your annual wellness visit   Preventive Care 40-64 Years, Male Preventive care refers to lifestyle choices and visits with your health care provider that can promote health and wellness. What does preventive care include? A yearly physical exam. This is also called an annual well check. Dental exams once or twice a year. Routine eye exams. Ask your health care provider how often you should have your eyes checked. Personal lifestyle choices, including: Daily care of your teeth and gums. Regular physical activity. Eating a healthy diet. Avoiding tobacco and drug use. Limiting alcohol use. Practicing safe sex. Taking low-dose aspirin every day starting at age 40. What happens during an annual well check? The services and screenings done by your health care provider during your annual well check will depend on your age, overall health, lifestyle risk factors, and family history of disease. Counseling  Your  health care provider may ask you questions about your: Alcohol use. Tobacco use. Drug use. Emotional well-being. Home and relationship well-being. Sexual activity. Eating habits. Work and work Astronomer. Screening  You may have the following tests or measurements: Height, weight, and BMI. Blood pressure. Lipid and cholesterol levels. These may be checked every 5 years, or more frequently if you are over 51 years old. Skin check. Lung cancer screening. You may have this screening every year starting at age 52 if you have a 30-pack-year history of smoking and currently smoke or have quit within the past 15 years. Fecal occult blood test (FOBT) of the stool. You may have this test every year starting at age 97. Flexible sigmoidoscopy or colonoscopy. You may have a sigmoidoscopy every 5 years or a colonoscopy every 10 years starting at age 40. Prostate cancer screening. Recommendations will vary depending on your family history and other risks. Hepatitis C blood test. Hepatitis B blood test. Sexually transmitted disease (STD) testing. Diabetes screening. This is done by checking your blood sugar (glucose) after you have not eaten for a while (fasting). You may have this done every 1-3 years. Discuss your test results, treatment options, and if necessary, the need for more tests with your health care provider. Vaccines  Your health care provider may recommend certain vaccines, such as: Influenza vaccine. This is recommended every year. Tetanus, diphtheria, and acellular pertussis (Tdap, Td) vaccine. You may need a Td booster every 10 years. Zoster vaccine. You may need this after age 45. Pneumococcal 13-valent conjugate (PCV13) vaccine. You may need this if you have certain conditions and have not been vaccinated. Pneumococcal polysaccharide (PPSV23) vaccine.  You may need one or two doses if you smoke cigarettes or if you have certain conditions. Talk to your health care provider about  which screenings and vaccines you need and how often you need them. This information is not intended to replace advice given to you by your health care provider. Make sure you discuss any questions you have with your health care provider. Document Released: 05/11/2015 Document Revised: 01/02/2016 Document Reviewed: 02/13/2015 Elsevier Interactive Patient Education  2017 [employer].  Fall Prevention in the Home Falls can cause injuries. They can happen to people of all ages. There are many things you can do to make your home safe and to help prevent falls. What can I do on the outside of my home? Regularly fix the edges of walkways and driveways and fix any cracks. Remove anything that might make you trip as you walk through a door, such as a raised step or threshold. Trim any bushes or trees on the path to your home. Use bright outdoor lighting. Clear any walking paths of anything that might make someone trip, such as rocks or tools. Regularly check to see if handrails are loose or broken. Make sure that both sides of any steps have handrails. Any raised decks and porches should have guardrails on the edges. Have any leaves, snow, or ice cleared regularly. Use sand or salt on walking paths during winter. Clean up any spills in your garage right away. This includes oil or grease spills. What can I do in the bathroom? Use night lights. Install grab bars by the toilet and in the tub and shower. Do not use towel bars as grab bars. Use non-skid mats or decals in the tub or shower. If you need to sit down in the shower, use a plastic, non-slip stool. Keep the floor dry. Clean up any water that spills on the floor as soon as it happens. Remove soap buildup in the tub or shower regularly. Attach bath mats securely with double-sided non-slip rug tape. Do not have throw rugs and other things on the floor that can make you trip. What can I do in the bedroom? Use night lights. Make sure that you  have a light by your bed that is easy to reach. Do not use any sheets or blankets that are too big for your bed. They should not hang down onto the floor. Have a firm chair that has side arms. You can use this for support while you get dressed. Do not have throw rugs and other things on the floor that can make you trip. What can I do in the kitchen? Clean up any spills right away. Avoid walking on wet floors. Keep items that you use a lot in easy-to-reach places. If you need to reach something above you, use a strong step stool that has a grab bar. Keep electrical cords out of the way. Do not use floor polish or wax that makes floors slippery. If you must use wax, use non-skid floor wax. Do not have throw rugs and other things on the floor that can make you trip. What can I do with my stairs? Do not leave any items on the stairs. Make sure that there are handrails on both sides of the stairs and use them. Fix handrails that are broken or loose. Make sure that handrails are as long as the stairways. Check any carpeting to make sure that it is firmly attached to the stairs. Fix any carpet that is loose or worn.  Avoid having throw rugs at the top or bottom of the stairs. If you do have throw rugs, attach them to the floor with carpet tape. Make sure that you have a light switch at the top of the stairs and the bottom of the stairs. If you do not have them, ask someone to add them for you. What else can I do to help prevent falls? Wear shoes that: Do not have high heels. Have rubber bottoms. Are comfortable and fit you well. Are closed at the toe. Do not wear sandals. If you use a stepladder: Make sure that it is fully opened. Do not climb a closed stepladder. Make sure that both sides of the stepladder are locked into place. Ask someone to hold it for you, if possible. Clearly mark and make sure that you can see: Any grab bars or handrails. First and last steps. Where the edge of each  step is. Use tools that help you move around (mobility aids) if they are needed. These include: Canes. Walkers. Scooters. Crutches. Turn on the lights when you go into a dark area. Replace any light bulbs as soon as they burn out. Set up your furniture so you have a clear path. Avoid moving your furniture around. If any of your floors are uneven, fix them. If there are any pets around you, be aware of where they are. Review your medicines with your doctor. Some medicines can make you feel dizzy. This can increase your chance of falling. Ask your doctor what other things that you can do to help prevent falls. This information is not intended to replace advice given to you by your health care provider. Make sure you discuss any questions you have with your health care provider. Document Released: 02/08/2009 Document Revised: 09/20/2015 Document Reviewed: 05/19/2014 Elsevier Interactive Patient Education  2017 [employer].

## 2020-12-13 NOTE — Progress Notes (Signed)
Subjective:   Justin Schmidt is a 40 y.o. male who presents for Medicare Annual/Subsequent preventive examination.  Virtual Visit via Telephone Note  I connected with  Justin Schmidt on 12/13/20 at  4:15 PM EDT by telephone and verified that I am speaking with the correct person using two identifiers.  Location: Patient: Home Provider: [facility] Persons participating in the virtual visit: patient/Nurse Health Advisor   I discussed the limitations, risks, security and privacy concerns of performing an evaluation and management service by telephone and the availability of in person appointments. The patient expressed understanding and agreed to proceed.  Interactive audio and video telecommunications were attempted between this nurse and patient, however failed, due to patient having technical difficulties OR patient did not have access to video capability.  We continued and completed visit with audio only.  Some vital signs may be absent or patient reported.   Legacy Lacivita E Jamerius Boeckman, LPN   Review of Systems     Cardiac Risk Factors include: male gender;obesity (BMI >30kg/m2);family history of premature cardiovascular disease;dyslipidemia     Objective:    Today's Vitals   12/13/20 1618  Weight: (!) 415 lb (188.2 kg)  Height: 5\' 9"  (1.753 m)   Body mass index is 61.28 kg/m.  Advanced Directives 12/13/2020 08/12/2017 05/04/2017 03/14/2017 01/29/2017 01/28/2017 11/20/2016  Does Patient Have a Medical Advance Directive? No No No No No No No  Would patient like information on creating a medical advance directive? No - Patient declined No - Patient declined No - Patient declined - No - Patient declined No - Patient declined -  Pre-existing out of facility DNR order (yellow form or pink MOST form) - - - - - - -    Current Medications (verified) Outpatient Encounter Medications as of 12/13/2020  Medication Sig   acetaminophen-codeine (TYLENOL #3) 300-30 MG tablet TAKE 1 TABLET BY MOUTH AT BEDTIME AS  NEEDED FOR MODERATE PAIN.   Ascorbic Acid (VITAMIN C) 1000 MG tablet Take 1,000 mg by mouth daily.   aspirin EC 81 MG tablet Take 162 mg by mouth daily.    benzonatate (TESSALON PERLES) 100 MG capsule Take 1 capsule (100 mg total) by mouth 3 (three) times daily as needed for cough.   Cholecalciferol (VITAMIN D) 2000 UNITS tablet Take 2,000 Units by mouth daily.   CINNAMON PO Take by mouth.   dexamethasone (DECADRON) 6 MG tablet Take 1 tablet (6 mg total) by mouth 2 (two) times daily with a meal.   dextromethorphan-guaiFENesin (MUCINEX DM) 30-600 MG 12hr tablet Take 1 tablet by mouth 2 (two) times daily.   diclofenac (VOLTAREN) 50 MG EC tablet Take 1 tablet (50 mg total) by mouth 2 (two) times daily.   escitalopram (LEXAPRO) 10 MG tablet Take 1 tablet (10 mg total) by mouth daily.   fluticasone (FLONASE) 50 MCG/ACT nasal spray Place 2 sprays into both nostrils daily.   furosemide (LASIX) 20 MG tablet Take 1 tablet (20 mg total) by mouth daily.   GARLIC PO Take by mouth.   Multiple Vitamin (MULTIVITAMIN WITH MINERALS) TABS Take 1 tablet by mouth daily.   OVER THE COUNTER MEDICATION zinc   OVER THE COUNTER MEDICATION Flaxseed oil   OZEMPIC, 0.25 OR 0.5 MG/DOSE, 2 MG/1.5ML SOPN INJECT 0.25MG  INTO THE SKIN ONE TIME PER WEEK   Facility-Administered Encounter Medications as of 12/13/2020  Medication   sodium chloride irrigation 0.9 %    Allergies (verified) Metformin and related and Xarelto [rivaroxaban]   History: Past Medical  History:  Diagnosis Date   Acute saddle pulmonary embolism without acute cor pulmonale (HCC) 01/30/2017   Back pain    Gallstones    Hyperlipidemia    Type 2 diabetes mellitus without complication, without long-term current use of insulin (HCC) 08/11/2018   VTE (venous thromboembolism) 05/14/2017   Past Surgical History:  Procedure Laterality Date   APPENDECTOMY  1991   CHOLECYSTECTOMY  01/23/2012   Procedure: LAPAROSCOPIC CHOLECYSTECTOMY;  Surgeon: Fabio BeringBrent C  Ziegler, MD;  Location: [facility];  Service: General;  Laterality: N/A;   TONSILLECTOMY     Family History  Problem Relation Age of Onset   Diabetes Mother    Diabetes Father    Hypertension Father    Hyperlipidemia Father    Migraines Sister    Diabetes Maternal Uncle    Diabetes Maternal Grandmother    Diabetes Other    Social History   Socioeconomic History   Marital status: Single    Spouse name: Not on file   Number of children: Not on file   Years of education: 12th grade   Highest education level: High school graduate  Occupational History   Occupation: disabled  Tobacco Use   Smoking status: Never   Smokeless tobacco: Never  Vaping Use   Vaping Use: Never used  Substance and Sexual Activity   Alcohol use: Yes    Alcohol/week: 1.0 standard drink    Types: 1 Cans of beer per week    Comment: very rare   Drug use: No   Sexual activity: Never  Other Topics Concern   Not on file  Social History Narrative   Lives alone - no children   Social Determinants of Health   Financial Resource Strain: Low Risk    Difficulty of Paying Living Expenses: Not hard at all  Food Insecurity: No Food Insecurity   Worried About Programme researcher, broadcasting/film/videounning Out of Food in the Last Year: Never true   Ran Out of Food in the Last Year: Never true  Transportation Needs: No Transportation Needs   Lack of Transportation (Medical): No   Lack of Transportation (Non-Medical): No  Physical Activity: Sufficiently Active   Days of Exercise per Week: 7 days   Minutes of Exercise per Session: 30 min  Stress: No Stress Concern Present   Feeling of Stress : Not at all  Social Connections: Moderately Isolated   Frequency of Communication with Friends and Family: More than three times a week   Frequency of Social Gatherings with Friends and Family: Once a week   Attends Religious Services: Never   Database administratorActive Member of Clubs or Organizations: Yes   Attends BankerClub or Organization Meetings: Never   Marital Status: Never  married    Tobacco Counseling Counseling given: Not Answered   Clinical Intake:  Pre-visit preparation completed: Yes  Pain : No/denies pain     BMI - recorded: 61.28 Nutritional Status: BMI > 30  Obese Nutritional Risks: None Diabetes: Yes CBG done?: No Did pt. bring in CBG monitor from home?: No  How often do you need to have someone help you when you read instructions, pamphlets, or other written materials from your doctor or pharmacy?: 1 - Never  Diabetic? Nutrition Risk Assessment:  Has the patient had any N/V/D within the last 2 months?  No  Does the patient have any non-healing wounds?  No  Has the patient had any unintentional weight loss or weight gain?  Yes  - recently had covid  Diabetes:  Is the patient  diabetic?  Yes  If diabetic, was a CBG obtained today?  No  Did the patient bring in their glucometer from home?  No  How often do you monitor your CBG's? Once daily fasting 117 this am per patient.   Financial Strains and Diabetes Management:  Are you having any financial strains with the device, your supplies or your medication? No .  Does the patient want to be seen by Chronic Care Management for management of their diabetes?  No  Would the patient like to be referred to a Nutritionist or for Diabetic Management?  No   Diabetic Exams:  Diabetic Eye Exam: Overdue for diabetic eye exam. Pt has been advised about the importance in completing this exam. Advised to contact [facility] or [facility] for routine eye exam soon.  Diabetic Foot Exam: Completed 07/10/2020. Pt has been advised about the importance in completing this exam. Pt is scheduled for diabetic foot exam on 02/19/2021.    Interpreter Needed?: No  Information entered by :: Yanci Bachtell, LPN   Activities of Daily Living In your present state of health, do you have any difficulty performing the following activities: 12/13/2020  Hearing? N  Vision? N  Difficulty concentrating or making  decisions? N  Walking or climbing stairs? N  Dressing or bathing? N  Doing errands, shopping? N  Preparing Food and eating ? N  Using the Toilet? N  In the past six months, have you accidently leaked urine? N  Do you have problems with loss of bowel control? N  Managing your Medications? N  Managing your Finances? N  Housekeeping or managing your Housekeeping? N  Some recent data might be hidden    Patient Care Team: Gwenlyn Fudge, FNP as PCP - General (Family Medicine) Gwenith Daily, RN as Registered Nurse [place], Lilla Shook, [facility] (Pharmacist) Randa Spike Kelton Pillar, LCSW as Social Worker (Licensed Clinical Social Worker) Conley Rolls, Westley Chandler, MD as Referring Physician (Optometry)  Indicate any recent Medical Services you may have received from other than [facility] providers in the past year (date may be approximate).     Assessment:   This is a routine wellness examination for Justin Schmidt.  Hearing/Vision screen Hearing Screening - Comments:: Denies hearing difficulties  Vision Screening - Comments:: Denies vision difficulties - doesn't have an eye doctor - has been informed of the importance of annual eye exams for diabetics  Dietary issues and exercise activities discussed: Current Exercise Habits: Home exercise routine, Type of exercise: walking, Time (Minutes): 30, Frequency (Times/Week): 7, Weekly Exercise (Minutes/Week): 210, Intensity: Mild, Exercise limited by: orthopedic condition(s)   Goals Addressed             This Visit's Progress    Have 3 meals a day   On track    Eat 3 meals daily that consist of lean proteins, fruits and vegetables Increase water intake        Depression Screen PHQ 2/9 Scores 12/13/2020 07/10/2020 04/17/2020 02/08/2020 01/17/2020 10/14/2019 07/14/2019  PHQ - 2 Score 0 0 0 2 2 3 1   PHQ- 9 Score - 4 - 4 4 9  -  Exception Documentation - - - - - - -    Fall Risk Fall Risk  12/13/2020 01/17/2020 07/14/2019 04/20/2019 04/04/2019  Falls in the past year? 0 0  0 0 0  Number falls in past yr: 0 - - - -  Injury with Fall? 0 - - - -  Risk for fall due to : Orthopedic patient - - - -  Follow up Falls prevention discussed - - - -    FALL RISK PREVENTION PERTAINING TO THE HOME:  Any stairs in or around the home? Yes  If so, are there any without handrails? No  Home free of loose throw rugs in walkways, pet beds, electrical cords, etc? Yes  Adequate lighting in your home to reduce risk of falls? Yes   ASSISTIVE DEVICES UTILIZED TO PREVENT FALLS:  Life alert? No  Use of a cane, walker or w/c? No  Grab bars in the bathroom? No  Shower chair or bench in shower? No  Elevated toilet seat or a handicapped toilet? No   TIMED UP AND GO:  Was the test performed? No . Telephonic visit  Cognitive Function: Normal cognitive status assessed by direct observation by this Nurse Health Advisor. No abnormalities found.    MMSE - Mini Mental State Exam 12/01/2017 11/19/2015 09/29/2014  Orientation to time 5 5 5   Orientation to Place 5 5 5   Registration 3 3 3   Attention/ Calculation 5 5 5   Recall 3 3 3   Language- name 2 objects 2 2 2   Language- repeat 1 1 1   Language- follow 3 step command 3 3 3   Language- read & follow direction 1 1 1   Write a sentence 1 1 1   Copy design 1 1 1   Total score 30 30 30         Immunizations Immunization History  Administered Date(s) Administered   Influenza,inj,Quad PF,6+ Mos 02/01/2015, 02/07/2016, 03/19/2018, 04/20/2019   Td 04/08/2005   Tdap 12/01/2017    TDAP status: Up to date  Flu Vaccine status: Up to date  Pneumococcal vaccine status: Up to date  Covid-19 vaccine status: Declined, Education has been provided regarding the importance of this vaccine but patient still declined. Advised may receive this vaccine at local pharmacy or Health Dept.or vaccine clinic. Aware to provide a copy of the vaccination record if obtained from local pharmacy or Health Dept. Verbalized acceptance and  understanding.  Qualifies for Shingles Vaccine? No   Zostavax completed No    Screening Tests Health Maintenance  Topic Date Due   COVID-19 Vaccine (1) Never done   Pneumococcal Vaccine 24-43 Years old (1 - PCV) Never done   OPHTHALMOLOGY EXAM  Never done   INFLUENZA VACCINE  11/26/2020   PNEUMOCOCCAL POLYSACCHARIDE VACCINE AGE 74-64 HIGH RISK  04/17/2021 (Originally [birth year]/1982)   HEMOGLOBIN A1C  01/10/2021   FOOT EXAM  07/10/2021   URINE MICROALBUMIN  07/10/2021   TETANUS/TDAP  12/02/2027   Hepatitis C Screening  Completed   HIV Screening  Completed   HPV VACCINES  Aged Out    Health Maintenance  Health Maintenance Due  Topic Date Due   COVID-19 Vaccine (1) Never done   Pneumococcal Vaccine 40-66 Years old (1 - PCV) Never done   OPHTHALMOLOGY EXAM  Never done   INFLUENZA VACCINE  11/26/2020    Colorectal Cancer screening: due at age 38  Lung Cancer Screening: (Low Dose CT Chest recommended if Age 84-80 years, 30 pack-year currently smoking OR have quit w/in 15years.) does not qualify.   Additional Screening:  Hepatitis C Screening: does not qualify; Completed 08/22/2016  Vision Screening: Recommended annual ophthalmology exams for early detection of glaucoma and other disorders of the eye. Is the patient up to date with their annual eye exam?  No  Who is the provider or what is the name of the office in which the patient attends annual eye exams? none If pt  is not established with a provider, would they like to be referred to a provider to establish care? No .   Dental Screening: Recommended annual dental exams for proper oral hygiene  Community Resource Referral / Chronic Care Management: CRR required this visit?  No   CCM required this visit?  No      Plan:     I have personally reviewed and noted the following in the patient's chart:   Medical and social history Use of alcohol, tobacco or illicit drugs  Current medications and supplements including opioid  prescriptions. Patient is not currently taking opioid prescriptions. Functional ability and status Nutritional status Physical activity Advanced directives List of other physicians Hospitalizations, surgeries, and ER visits in previous 12 months Vitals Screenings to include cognitive, depression, and falls Referrals and appointments  In addition, I have reviewed and discussed with patient certain preventive protocols, quality metrics, and best practice recommendations. A written personalized care plan for preventive services as well as general preventive health recommendations were provided to patient.     [place] Constable, LPN   1/61/0960   Nurse Notes: None

## 2020-12-29 LAB — HM HEPATITIS C SCREENING LAB: HM Hepatitis Screen: NEGATIVE

## 2021-01-02 ENCOUNTER — Ambulatory Visit (INDEPENDENT_AMBULATORY_CARE_PROVIDER_SITE_OTHER): Payer: Medicare Other | Admitting: Licensed Clinical Social Worker

## 2021-01-02 DIAGNOSIS — R0600 Dyspnea, unspecified: Secondary | ICD-10-CM

## 2021-01-02 DIAGNOSIS — E8881 Metabolic syndrome: Secondary | ICD-10-CM

## 2021-01-02 DIAGNOSIS — E119 Type 2 diabetes mellitus without complications: Secondary | ICD-10-CM

## 2021-01-02 DIAGNOSIS — R0609 Other forms of dyspnea: Secondary | ICD-10-CM

## 2021-01-02 DIAGNOSIS — Z6841 Body Mass Index (BMI) 40.0 and over, adult: Secondary | ICD-10-CM

## 2021-01-02 DIAGNOSIS — Z86718 Personal history of other venous thrombosis and embolism: Secondary | ICD-10-CM

## 2021-01-02 DIAGNOSIS — Z86711 Personal history of pulmonary embolism: Secondary | ICD-10-CM

## 2021-01-02 DIAGNOSIS — F339 Major depressive disorder, recurrent, unspecified: Secondary | ICD-10-CM

## 2021-01-02 NOTE — Patient Instructions (Signed)
Visit Information  PATIENT GOALS:  Goals Addressed             This Visit's Progress    Manage Health issues faced;Manage Depression issues faced       Timeframe:  Short-Term Goal Priority:  Medium Progress: On Track Start Date:            01/02/21                Expected End Date:          04/02/21            Follow Up Date 02/13/21   Protect My Health (Patient)   Manage Health issues faced . Manage Depression issues faced   Why is this important?   Screening tests can find diseases early when they are easier to treat.  Your doctor or nurse will talk with you about which tests are important for you.  Getting shots for common diseases like the flu and shingles will help prevent them.   Patient Strengths/Coping Skills: Exercises regularly Tries to eat a health diet  Patient Deficits Some Pain and mobility challenges  Patient Goals:  Over next 30 days, client will talk with LCSW about his coping strategies to manage his current health needs Over next 30 days , patient will attend all scheduled client medical appointments Over next 30 days, client will call RNCM or LCSW as needed for CCM support -  Follow Up Plan:  LCSW to call client on 02/13/21 to assess client needs           Kelton Pillar.Merrianne Mccumbers MSW, LCSW Licensed Clinical Social Worker [facility] Care Management [phone]

## 2021-01-02 NOTE — Chronic Care Management (AMB) (Signed)
Chronic Care Management    Clinical Social Work Note  01/02/2021 Name: Justin Schmidt MRN: [id number] DOB: 30-Oct-1980  Justin Schmidt is a 40 y.o. year old male who is a primary care patient of Justin Fudge, FNP. The CCM team was consulted to assist the patient with chronic disease management and/or care coordination needs related to: [facility] .   Engaged with patient by telephone for follow up visit in response to provider referral for social work chronic care management and care coordination services.   Consent to Services:  The patient was given information about Chronic Care Management services, agreed to services, and gave verbal consent prior to initiation of services.  Please see initial visit note for detailed documentation.   Patient agreed to services and consent obtained.   Assessment: Review of patient past medical history, allergies, medications, and health status, including review of relevant consultants reports was performed today as part of a comprehensive evaluation and provision of chronic care management and care coordination services.     SDOH (Social Determinants of Health) assessments and interventions performed:  SDOH Interventions    Flowsheet Row Most Recent Value  SDOH Interventions   Stress Interventions Provide Counseling  [client has stress related to ongoing health needs. Ongoing back pain issues. Challenges in managing weight.  Has recently lost about 30 pounds]  Depression Interventions/Treatment  Currently on Treatment        Advanced Directives Status: See Vynca application for related entries.  CCM Care Plan  Allergies  Allergen Reactions   Metformin And Related     diarrhea   Xarelto [Rivaroxaban]     DROWSINESS, FATIGUE    Outpatient Encounter Medications as of 01/02/2021  Medication Sig   acetaminophen-codeine (TYLENOL #3) 300-30 MG tablet TAKE 1 TABLET BY MOUTH AT BEDTIME AS NEEDED FOR MODERATE PAIN.   Ascorbic Acid (VITAMIN  C) 1000 MG tablet Take 1,000 mg by mouth daily.   aspirin EC 81 MG tablet Take 162 mg by mouth daily.    benzonatate (TESSALON PERLES) 100 MG capsule Take 1 capsule (100 mg total) by mouth 3 (three) times daily as needed for cough.   Cholecalciferol (VITAMIN D) 2000 UNITS tablet Take 2,000 Units by mouth daily.   CINNAMON PO Take by mouth.   dexamethasone (DECADRON) 6 MG tablet Take 1 tablet (6 mg total) by mouth 2 (two) times daily with a meal.   dextromethorphan-guaiFENesin (MUCINEX DM) 30-600 MG 12hr tablet Take 1 tablet by mouth 2 (two) times daily.   diclofenac (VOLTAREN) 50 MG EC tablet Take 1 tablet (50 mg total) by mouth 2 (two) times daily.   escitalopram (LEXAPRO) 10 MG tablet Take 1 tablet (10 mg total) by mouth daily.   fluticasone (FLONASE) 50 MCG/ACT nasal spray Place 2 sprays into both nostrils daily.   furosemide (LASIX) 20 MG tablet Take 1 tablet (20 mg total) by mouth daily.   GARLIC PO Take by mouth.   Multiple Vitamin (MULTIVITAMIN WITH MINERALS) TABS Take 1 tablet by mouth daily.   OVER THE COUNTER MEDICATION zinc   OVER THE COUNTER MEDICATION Flaxseed oil   OZEMPIC, 0.25 OR 0.5 MG/DOSE, 2 MG/1.5ML SOPN INJECT 0.25MG  INTO THE SKIN ONE TIME PER WEEK   Facility-Administered Encounter Medications as of 01/02/2021  Medication   sodium chloride irrigation 0.9 %    Patient Active Problem List   Diagnosis Date Noted   Cough 11/30/2020   Chronic pain of left knee 07/10/2020   History of pulmonary embolus (  PE) 11/08/2018   History of DVT (deep vein thrombosis) 11/08/2018   Type 2 diabetes mellitus without complication, without long-term current use of insulin (HCC) 08/11/2018   Dyspnea on exertion 01/30/2017   Morbid obesity (HCC) 01/30/2017   Controlled substance agreement signed 09/11/2016   Chronic back pain 09/11/2016   Depression, recurrent (HCC) 09/11/2016   Fatty liver 05/17/2015   Elevated transaminase level 05/17/2015   Metabolic syndrome 02/01/2015    Hypertriglyceridemia 09/05/2013    Conditions to be addressed/monitored:  monitor client management of depression issues; monitor client management of health needs  Care Plan : LCSW Care Plan  Updates made by Isaiah Blakes, LCSW since 01/02/2021 12:00 AM     Problem: Coping Skills (General Plan of Care)      Goal: Manage Health needs faced; Manage Weight Challenges; Manage Depression issues faced   Start Date: 01/02/2021  Expected End Date: 04/02/2021  This Visit's Progress: On track  Recent Progress: On track  Priority: Medium  Note:   Current barriers:   Patient in need of assistance with connecting to community resources for possible help in managing his ongoing medical needs Patient is unable to independently navigate community resource options without care coordination support Depression issues  Clinical Goals:  patient will work with SW  in next 30 days to address concerns related to client management of his current health conditions Client to talk with LCSW in next 30 days about mobility issues of client and pain issues of client   Clinical Interventions:  Collaboration with Justin Fudge, FNP regarding development and update of comprehensive plan of care as evidenced by provider attestation and co-signature  Talked with client about pain issues of client (occasional back pain) Talked with client about mood of client. He said he is generally in a good mood. Recovering from COVID 19.  [place] Spaniel he has a little less energy some times.  He has support from his father.  Likes hobbies, exercising, and feels that his mood is stable Talked with client about exercise of client Talked with client about sleeping issues of client Talked with client about transport needs of client Talked with client about his managing Diabetes symptoms.  He said he is trying to manage well his Diabetes. He said that due to COVID 19, he had lost about 30 pounds.  He said he weighs about 415 pounds  currently Encouraged client to call RNCM as needed for nursing support Talked with client about relaxation techniques (karate, weightlifting, walking) Talked with client about family support (father, sister) Provided counseling support for client LCSW talked with client in detail about [employer] Dual [employer] Program.  LCSW encouraged client to call [employer] benefits to see if he may qualify for this insurance program.  Client is closely watching his diet and food intake. If [employer] Dual [employer] has a healthy choice food benefit that client could receive, this could be very helpful to him at this time  Patient Strengths/Coping Skills: Exercises regularly Tries to eat a health diet  Patient Deficits Some Pain and mobility challenges  Patient Goals:  Over next 30 days, client will talk with LCSW about his coping strategies to manage his current health needs Over next 30 days , patient will attend all scheduled client medical appointments Over next 30 days, client will call RNCM or LCSW as needed for CCM support -  Follow Up Plan:  LCSW to call client on 02/13/21 to assess client needs     Kelton Pillar.Wenceslaus Gist MSW, [employer] Licensed  Clinical Social Worker [facility] Care Management [phone]

## 2021-01-07 ENCOUNTER — Encounter: Payer: Self-pay | Admitting: Family Medicine

## 2021-01-07 NOTE — Progress Notes (Signed)
Letter from [facility] received with report of non-reactive hepatitis C test on 12/29/2020.

## 2021-01-17 ENCOUNTER — Other Ambulatory Visit: Payer: Self-pay | Admitting: Family Medicine

## 2021-01-17 DIAGNOSIS — R609 Edema, unspecified: Secondary | ICD-10-CM

## 2021-01-25 DIAGNOSIS — E119 Type 2 diabetes mellitus without complications: Secondary | ICD-10-CM

## 2021-01-25 DIAGNOSIS — F339 Major depressive disorder, recurrent, unspecified: Secondary | ICD-10-CM | POA: Diagnosis not present

## 2021-02-13 ENCOUNTER — Telehealth: Payer: Medicare Other

## 2021-02-19 ENCOUNTER — Other Ambulatory Visit: Payer: Self-pay | Admitting: Family Medicine

## 2021-02-19 ENCOUNTER — Encounter: Payer: Self-pay | Admitting: Family Medicine

## 2021-02-19 ENCOUNTER — Ambulatory Visit (INDEPENDENT_AMBULATORY_CARE_PROVIDER_SITE_OTHER): Payer: Medicare Other | Admitting: Family Medicine

## 2021-02-19 ENCOUNTER — Ambulatory Visit (INDEPENDENT_AMBULATORY_CARE_PROVIDER_SITE_OTHER): Payer: Medicare Other

## 2021-02-19 ENCOUNTER — Other Ambulatory Visit: Payer: Self-pay

## 2021-02-19 VITALS — BP 134/83 | HR 94 | Temp 96.9°F | Resp 20 | Ht 69.0 in | Wt >= 6400 oz

## 2021-02-19 DIAGNOSIS — G8929 Other chronic pain: Secondary | ICD-10-CM

## 2021-02-19 DIAGNOSIS — E781 Pure hyperglyceridemia: Secondary | ICD-10-CM | POA: Diagnosis not present

## 2021-02-19 DIAGNOSIS — E119 Type 2 diabetes mellitus without complications: Secondary | ICD-10-CM

## 2021-02-19 DIAGNOSIS — M545 Low back pain, unspecified: Secondary | ICD-10-CM | POA: Diagnosis not present

## 2021-02-19 DIAGNOSIS — R609 Edema, unspecified: Secondary | ICD-10-CM

## 2021-02-19 DIAGNOSIS — M25521 Pain in right elbow: Secondary | ICD-10-CM

## 2021-02-19 DIAGNOSIS — F339 Major depressive disorder, recurrent, unspecified: Secondary | ICD-10-CM

## 2021-02-19 DIAGNOSIS — Z79899 Other long term (current) drug therapy: Secondary | ICD-10-CM | POA: Diagnosis not present

## 2021-02-19 DIAGNOSIS — M25562 Pain in left knee: Secondary | ICD-10-CM | POA: Diagnosis not present

## 2021-02-19 DIAGNOSIS — Z6841 Body Mass Index (BMI) 40.0 and over, adult: Secondary | ICD-10-CM

## 2021-02-19 LAB — BAYER DCA HB A1C WAIVED: HB A1C (BAYER DCA - WAIVED): 6.5 % — ABNORMAL HIGH (ref 4.8–5.6)

## 2021-02-19 MED ORDER — ROSUVASTATIN CALCIUM 5 MG PO TABS: 5.0000 mg | ORAL_TABLET | Freq: Every day | ORAL | 2 refills | Status: DC

## 2021-02-19 NOTE — Progress Notes (Signed)
Assessment & Plan:  1. Type 2 diabetes mellitus without complication, without long-term current use of insulin (HCC) Lab Results  Component Value Date   HGBA1C 6.5 (H) 02/19/2021   HGBA1C 6.7 07/10/2020   HGBA1C 6.7 04/17/2020    - Diabetes is at goal of A1c < 7. - Medications:  not on medication to treat - Patient is currently taking a statin. Patient is not taking an ACE-inhibitor/ARB.  - Instruction/counseling given: reminded to get eye exam, discussed the need for weight loss, and discussed diet  Diabetes Health Maintenance Due  Topic Date Due   OPHTHALMOLOGY EXAM  05/27/2021 (Originally 02/11/1990)   FOOT EXAM  07/10/2021   URINE MICROALBUMIN  07/10/2021   HEMOGLOBIN A1C  08/20/2021    Lab Results  Component Value Date   LABMICR <3.0 07/10/2020   LABMICR 3.7 04/20/2019   - Bayer DCA Hb A1c Waived - CBC with Differential/Platelet - CMP14+EGFR - Lipid panel - rosuvastatin (CRESTOR) 5 MG tablet; Take 1 tablet (5 mg total) by mouth daily.  Dispense: 30 tablet; Refill: 2  2. Hypertriglyceridemia - CBC with Differential/Platelet - CMP14+EGFR - Lipid panel - rosuvastatin (CRESTOR) 5 MG tablet; Take 1 tablet (5 mg total) by mouth daily.  Dispense: 30 tablet; Refill: 2  3. Morbid obesity with BMI of 60.0-69.9, adult [facility]) Patient has gained 12 lbs in the past 7 months, but states his clothes are fitting loser. Continue health eating and exercise. Encouraged him to eat regular meals and further decrease carbs. Declines medication to help with weight loss. Printed education provided on the healthy plate method.   4-6. Chronic pain of left knee/Chronic midline low back pain without sciatica/Controlled substance agreement signed Well controlled on current regimen. Controlled substance agreement in place. Urine drug screen as expected. PDMP reviewed with no concerning findings.  - CMP14+EGFR - acetaminophen-codeine (TYLENOL #3) 300-30 MG tablet; Take 1 tablet by mouth daily as  needed for moderate pain.  Dispense: 30 tablet; Refill: 2  7. Elbow pain, right - DG Elbow 2 Views Right    Return in about 3 months (around 05/22/2021) for annual physical.  [name], MSN, APRN, FNP-C [facility]  Subjective:    Patient ID: Justin Schmidt, male    DOB: [birth year], 40 y.o.   MRN: [id number]  Patient Care Team: Loman Brooklyn, FNP as PCP - General (Family Medicine) Ilean China, RN as Registered Nurse Pruitt, Royce Macadamia, [facility] (Pharmacist) Shea Evans Norva Riffle, LCSW as Social Worker (Licensed Clinical Social Worker) Marin Comment, Allison Quarry, MD as Referring Physician (Optometry)   Chief Complaint:  Chief Complaint  Patient presents with   Medical Management of Chronic Issues   Elbow Pain    HPI: Justin Schmidt is a 40 y.o. male presenting on 02/19/2021 for Medical Management of Chronic Issues and Elbow Pain  Patient is accompanied by his father who he is okay with being present.  Pain assessment: Cause of pain- arthritis and morbid obesity Pain location- chronic to low back and left knee, acute to right elbow Pain on scale of 1-10- 6-8/10 without medication Frequency- daily What increases pain- activity What makes pain better- rest, medication Effects on ADL- limits at times Any change in general medical condition- none   Current opioids rx- Tylenol #3 QHS PRN # meds rx- 30 Effectiveness of current meds- good  Adverse reactions from pain meds- none Morphine equivalent- 4.50 MME/day  Pill count performed-No Last drug screen - 07/10/2020 ( high risk q40m  moderate risk q53m low risk yearly ) Urine drug screen today- No Was the [place]- Yes If yes were their any concerning findings? - No  Overdose risk: 060  Opioid Risk  04/20/2019  Alcohol 0  Illegal Drugs 0  Rx Drugs 0  Alcohol 0  Illegal Drugs 0  Rx Drugs 0  Age between 16-45 years  1  History of Preadolescent Sexual Abuse 0  Psychological Disease 0  Depression 1   Opioid Risk Tool Scoring 2  Opioid Risk Interpretation Low Risk   Pain contract signed on: /18/2021 - updated today   Diabetes: Current symptoms include: none. Known diabetic complications: none. Medication compliance: not on medications at this time. Current diet: in general, a "healthy" diet  , states he is eating at home 90% of the time instead of eating out. He is decreasing carbs and increasing water intake . Current exercise: walking. Is he  on ACE inhibitor or angiotensin II receptor blocker? No. Is he on a statin? No.   Morbid Obesity: patient is eating healthy and exercising. States he is eating pants he hasn't been able to fit in for the past four years. He was previously prescribed Ozempic to help with weight loss but states he did not take it because he wasn't to be able to lose weight on his own and not need a medication for it.  New complaints: Patient c/o right elbow pain. States he was swatting at a bug and when his arm outstretched all the way he felt like it popped backwards. He initially was wearing it in a sling, but states it is getting better so this is no longer needed. He takes diclofenac, Tylenol #3, and Tylenol for pain. He would like an x-ray today.   Social history:  Relevant past medical, surgical, family and social history reviewed and updated as indicated. Interim medical history since our last visit reviewed.  Allergies and medications reviewed and updated.  DATA REVIEWED: CHART IN EPIC  ROS: Negative unless specifically indicated above in HPI.    Current Outpatient Medications:    acetaminophen-codeine (TYLENOL #3) 300-30 MG tablet, TAKE 1 TABLET BY MOUTH AT BEDTIME AS NEEDED FOR MODERATE PAIN., Disp: 30 tablet, Rfl: 2   Ascorbic Acid (VITAMIN C) 1000 MG tablet, Take 1,000 mg by mouth daily., Disp: , Rfl:    aspirin EC 81 MG tablet, Take 162 mg by mouth daily. , Disp: , Rfl:    Cholecalciferol (VITAMIN D) 2000 UNITS tablet, Take 2,000 Units by mouth  daily., Disp: , Rfl:    CINNAMON PO, Take by mouth., Disp: , Rfl:    dexamethasone (DECADRON) 6 MG tablet, Take 1 tablet (6 mg total) by mouth 2 (two) times daily with a meal., Disp: 10 tablet, Rfl: 0   diclofenac (VOLTAREN) 50 MG EC tablet, Take 1 tablet (50 mg total) by mouth 2 (two) times daily., Disp: 180 tablet, Rfl: 2   escitalopram (LEXAPRO) 10 MG tablet, Take 1 tablet (10 mg total) by mouth daily., Disp: 90 tablet, Rfl: 1   fluticasone (FLONASE) 50 MCG/ACT nasal spray, Place 2 sprays into both nostrils daily., Disp: 48 g, Rfl: 4   furosemide (LASIX) 20 MG tablet, TAKE 1 TABLET BY MOUTH EVERY DAY, Disp: 90 tablet, Rfl: 0   GARLIC PO, Take by mouth., Disp: , Rfl:    Multiple Vitamin (MULTIVITAMIN WITH MINERALS) TABS, Take 1 tablet by mouth daily., Disp: , Rfl:    OVER THE COUNTER MEDICATION, zinc, Disp: , Rfl:  OVER THE COUNTER MEDICATION, Flaxseed oil, Disp: , Rfl:    OZEMPIC, 0.25 OR 0.5 MG/DOSE, 2 MG/1.5ML SOPN, INJECT 0.25MG INTO THE SKIN ONE TIME PER WEEK (Patient not taking: Reported on 02/19/2021), Disp: 1.5 mL, Rfl: 1 No current facility-administered medications for this visit.  Facility-Administered Medications Ordered in Other Visits:    sodium chloride irrigation 0.9 %, , , PRN, Chelsea Primus, MD, 3,000 mL at 01/27/12 4097   Allergies  Allergen Reactions   Metformin And Related     diarrhea   Xarelto [Rivaroxaban]     DROWSINESS, FATIGUE   Past Medical History:  Diagnosis Date   Acute saddle pulmonary embolism without acute cor pulmonale ([place]) 01/30/2017   Back pain    Gallstones    Hyperlipidemia    Type 2 diabetes mellitus without complication, without long-term current use of insulin ([place]) 08/11/2018   VTE (venous thromboembolism) 05/14/2017    Past Surgical History:  Procedure Laterality Date   APPENDECTOMY  1991   CHOLECYSTECTOMY  01/23/2012   Procedure: LAPAROSCOPIC CHOLECYSTECTOMY;  Surgeon: Donato Heinz, MD;  Location: [facility];  Service: General;   Laterality: N/A;   TONSILLECTOMY      Social History   Socioeconomic History   Marital status: Single    Spouse name: Not on file   Number of children: Not on file   Years of education: 12th grade   Highest education level: High school graduate  Occupational History   Occupation: disabled  Tobacco Use   Smoking status: Never   Smokeless tobacco: Never  Vaping Use   Vaping Use: Never used  Substance and Sexual Activity   Alcohol use: Yes    Alcohol/week: 1.0 standard drink    Types: 1 Cans of beer per week    Comment: very rare   Drug use: No   Sexual activity: Never  Other Topics Concern   Not on file  Social History Narrative   Lives alone - no children   Social Determinants of Health   Financial Resource Strain: Low Risk    Difficulty of Paying Living Expenses: Not hard at all  Food Insecurity: No Food Insecurity   Worried About Charity fundraiser in the Last Year: Never true   Ran Out of Food in the Last Year: Never true  Transportation Needs: No Transportation Needs   Lack of Transportation (Medical): No   Lack of Transportation (Non-Medical): No  Physical Activity: Sufficiently Active   Days of Exercise per Week: 7 days   Minutes of Exercise per Session: 30 min  Stress: Stress Concern Present   Feeling of Stress : To some extent  Social Connections: Moderately Isolated   Frequency of Communication with Friends and Family: More than three times a week   Frequency of Social Gatherings with Friends and Family: Once a week   Attends Religious Services: Never   Marine scientist or Organizations: Yes   Attends Archivist Meetings: Never   Marital Status: Never married  Human resources officer Violence: Not At Risk   Fear of Current or Ex-Partner: No   Emotionally Abused: No   Physically Abused: No   Sexually Abused: No        Objective:    BP 134/83   Pulse 94   Temp (!) 96.9 F (36.1 C) (Temporal)   Resp 20   Ht '5\' 9"'  (1.753 m)   Wt (!)  450 lb (204.1 kg)   SpO2 98%   BMI 66.45 kg/m  Wt Readings from Last 3 Encounters:  02/19/21 (!) 450 lb (204.1 kg)  12/13/20 (!) 415 lb (188.2 kg)  07/10/20 (!) 438 lb 6.4 oz (198.9 kg)    Physical Exam Vitals reviewed.  Constitutional:      General: He is not in acute distress.    Appearance: Normal appearance. He is morbidly obese. He is not ill-appearing, toxic-appearing or diaphoretic.  HENT:     Head: Normocephalic and atraumatic.  Eyes:     General: No scleral icterus.       Right eye: No discharge.        Left eye: No discharge.     Conjunctiva/sclera: Conjunctivae normal.  Cardiovascular:     Rate and Rhythm: Normal rate and regular rhythm.     Heart sounds: Normal heart sounds. No murmur heard.   No friction rub. No gallop.  Pulmonary:     Effort: Pulmonary effort is normal. No respiratory distress.     Breath sounds: Normal breath sounds. No stridor. No wheezing, rhonchi or rales.  Musculoskeletal:        General: Normal range of motion.     Right elbow: No swelling, deformity, effusion or lacerations. Normal range of motion. Tenderness present.     Cervical back: Normal range of motion.  Skin:    General: Skin is warm and dry.  Neurological:     Mental Status: He is alert and oriented to person, place, and time. Mental status is at baseline.  Psychiatric:        Mood and Affect: Mood normal.        Behavior: Behavior normal.        Thought Content: Thought content normal.        Judgment: Judgment normal.   Lab Results  Component Value Date   TSH 1.540 08/22/2016   Lab Results  Component Value Date   WBC 6.2 07/10/2020   HGB 14.4 07/10/2020   HCT 43.1 07/10/2020   MCV 93 07/10/2020   PLT 244 07/10/2020   Lab Results  Component Value Date   NA 143 07/10/2020   K 5.1 07/10/2020   CO2 23 07/10/2020   GLUCOSE 108 (H) 07/10/2020   BUN 11 07/10/2020   CREATININE 1.19 07/10/2020   BILITOT 0.3 07/10/2020   ALKPHOS 71 07/10/2020   AST 24 07/10/2020    ALT 45 (H) 07/10/2020   PROT 7.0 07/10/2020   ALBUMIN 4.3 07/10/2020   CALCIUM 9.4 07/10/2020   ANIONGAP 6 08/12/2017   EGFR 79 07/10/2020   Lab Results  Component Value Date   CHOL 169 07/10/2020   Lab Results  Component Value Date   HDL 44 07/10/2020   Lab Results  Component Value Date   LDLCALC 109 (H) 07/10/2020   Lab Results  Component Value Date   TRIG 87 07/10/2020   Lab Results  Component Value Date   CHOLHDL 3.8 07/10/2020   Lab Results  Component Value Date   HGBA1C 6.7 07/10/2020

## 2021-02-20 LAB — CMP14+EGFR
ALT: 43 IU/L (ref 0–44)
AST: 27 IU/L (ref 0–40)
Albumin/Globulin Ratio: 1.8 (ref 1.2–2.2)
Albumin: 4.6 g/dL (ref 4.0–5.0)
Alkaline Phosphatase: 62 IU/L (ref 44–121)
BUN/Creatinine Ratio: 15 (ref 9–20)
BUN: 15 mg/dL (ref 6–24)
Bilirubin Total: 0.3 mg/dL (ref 0.0–1.2)
CO2: 23 mmol/L (ref 20–29)
Calcium: 9.7 mg/dL (ref 8.7–10.2)
Chloride: 103 mmol/L (ref 96–106)
Creatinine, Ser: 1.03 mg/dL (ref 0.76–1.27)
Globulin, Total: 2.5 g/dL (ref 1.5–4.5)
Glucose: 122 mg/dL — ABNORMAL HIGH (ref 70–99)
Potassium: 4.4 mmol/L (ref 3.5–5.2)
Sodium: 143 mmol/L (ref 134–144)
Total Protein: 7.1 g/dL (ref 6.0–8.5)
eGFR: 94 mL/min/{1.73_m2} (ref >59)

## 2021-02-20 LAB — CBC WITH DIFFERENTIAL/PLATELET
Basophils Absolute: 0.1 10*3/uL (ref 0.0–0.2)
Basos: 1 %
EOS (ABSOLUTE): 0.1 10*3/uL (ref 0.0–0.4)
Eos: 3 %
Hematocrit: 41.3 % (ref 37.5–51.0)
Hemoglobin: 14.2 g/dL (ref 13.0–17.7)
Immature Grans (Abs): 0 10*3/uL (ref 0.0–0.1)
Immature Granulocytes: 0 %
Lymphocytes Absolute: 1.2 10*3/uL (ref 0.7–3.1)
Lymphs: 21 %
MCH: 31.3 pg (ref 26.6–33.0)
MCHC: 34.4 g/dL (ref 31.5–35.7)
MCV: 91 fL (ref 79–97)
Monocytes Absolute: 0.4 10*3/uL (ref 0.1–0.9)
Monocytes: 8 %
Neutrophils Absolute: 3.8 10*3/uL (ref 1.4–7.0)
Neutrophils: 67 %
Platelets: 267 10*3/uL (ref 150–450)
RBC: 4.54 x10E6/uL (ref 4.14–5.80)
RDW: 13.4 % (ref 11.6–15.4)
WBC: 5.7 10*3/uL (ref 3.4–10.8)

## 2021-02-20 LAB — LIPID PANEL
Chol/HDL Ratio: 4.2 ratio (ref 0.0–5.0)
Cholesterol, Total: 169 mg/dL (ref 100–199)
HDL: 40 mg/dL (ref >39)
LDL Chol Calc (NIH): 101 mg/dL — ABNORMAL HIGH (ref 0–99)
Triglycerides: 161 mg/dL — ABNORMAL HIGH (ref 0–149)
VLDL Cholesterol Cal: 28 mg/dL (ref 5–40)

## 2021-02-21 ENCOUNTER — Other Ambulatory Visit: Payer: Self-pay | Admitting: Family Medicine

## 2021-02-21 DIAGNOSIS — G8929 Other chronic pain: Secondary | ICD-10-CM

## 2021-02-21 DIAGNOSIS — M545 Low back pain, unspecified: Secondary | ICD-10-CM

## 2021-02-21 DIAGNOSIS — M25562 Pain in left knee: Secondary | ICD-10-CM

## 2021-02-21 DIAGNOSIS — Z79899 Other long term (current) drug therapy: Secondary | ICD-10-CM

## 2021-02-21 MED ORDER — ACETAMINOPHEN-CODEINE #3 300-30 MG PO TABS: 1.0000 | ORAL_TABLET | Freq: Every day | ORAL | 2 refills | Status: DC | PRN

## 2021-02-21 NOTE — Telephone Encounter (Signed)
Last office visit 02/19/21 Last refill 07/10/20, #30, 2 refills Contract has expired

## 2021-02-24 ENCOUNTER — Encounter: Payer: Self-pay | Admitting: Family Medicine

## 2021-02-24 MED ORDER — FUROSEMIDE 20 MG PO TABS: 20.0000 mg | ORAL_TABLET | Freq: Every day | ORAL | 1 refills | Status: DC

## 2021-02-24 MED ORDER — ESCITALOPRAM OXALATE 10 MG PO TABS: 10.0000 mg | ORAL_TABLET | Freq: Every day | ORAL | 1 refills | Status: DC

## 2021-02-24 MED ORDER — DICLOFENAC SODIUM 50 MG PO TBEC: 50.0000 mg | DELAYED_RELEASE_TABLET | Freq: Two times a day (BID) | ORAL | 2 refills | Status: DC

## 2021-03-13 ENCOUNTER — Ambulatory Visit (INDEPENDENT_AMBULATORY_CARE_PROVIDER_SITE_OTHER): Payer: Medicare Other | Admitting: Licensed Clinical Social Worker

## 2021-03-13 DIAGNOSIS — E119 Type 2 diabetes mellitus without complications: Secondary | ICD-10-CM

## 2021-03-13 DIAGNOSIS — Z86711 Personal history of pulmonary embolism: Secondary | ICD-10-CM

## 2021-03-13 DIAGNOSIS — R0609 Other forms of dyspnea: Secondary | ICD-10-CM

## 2021-03-13 DIAGNOSIS — E8881 Metabolic syndrome: Secondary | ICD-10-CM

## 2021-03-13 DIAGNOSIS — Z86718 Personal history of other venous thrombosis and embolism: Secondary | ICD-10-CM

## 2021-03-13 DIAGNOSIS — F339 Major depressive disorder, recurrent, unspecified: Secondary | ICD-10-CM

## 2021-03-13 NOTE — Chronic Care Management (AMB) (Signed)
Chronic Care Management    Clinical Social Work Note  03/13/2021 Name: Justin Schmidt MRN: [id number] DOB: 07/17/1980  Justin Schmidt is a 40 y.o. year old male who is a primary care patient of Gwenlyn Fudge, FNP. The CCM team was consulted to assist the patient with chronic disease management and/or care coordination needs related to: [employer] .   Engaged with patient by telephone for follow up visit in response to provider referral for social work chronic care management and care coordination services.   Consent to Services:  The patient was given information about Chronic Care Management services, agreed to services, and gave verbal consent prior to initiation of services.  Please see initial visit note for detailed documentation.   Patient agreed to services and consent obtained.   Assessment: Review of patient past medical history, allergies, medications, and health status, including review of relevant consultants reports was performed today as part of a comprehensive evaluation and provision of chronic care management and care coordination services.     SDOH (Social Determinants of Health) assessments and interventions performed:  SDOH Interventions    Flowsheet Row Most Recent Value  SDOH Interventions   Stress Interventions Provide Counseling  [client has stress related to managing his health needs]  Depression Interventions/Treatment  Currently on Treatment        Advanced Directives Status: See Vynca application for related entries.  CCM Care Plan  Allergies  Allergen Reactions   Metformin And Related     diarrhea   Xarelto [Rivaroxaban]     DROWSINESS, FATIGUE    Outpatient Encounter Medications as of 03/13/2021  Medication Sig   acetaminophen-codeine (TYLENOL #3) 300-30 MG tablet Take 1 tablet by mouth daily as needed for moderate pain.   Ascorbic Acid (VITAMIN C) 1000 MG tablet Take 1,000 mg by mouth daily.   aspirin EC 81 MG tablet Take 162 mg by  mouth daily.    Cholecalciferol (VITAMIN D) 2000 UNITS tablet Take 2,000 Units by mouth daily.   CINNAMON PO Take by mouth.   diclofenac (VOLTAREN) 50 MG EC tablet Take 1 tablet (50 mg total) by mouth 2 (two) times daily.   escitalopram (LEXAPRO) 10 MG tablet Take 1 tablet (10 mg total) by mouth daily.   fluticasone (FLONASE) 50 MCG/ACT nasal spray Place 2 sprays into both nostrils daily.   furosemide (LASIX) 20 MG tablet Take 1 tablet (20 mg total) by mouth daily.   GARLIC PO Take by mouth.   Multiple Vitamin (MULTIVITAMIN WITH MINERALS) TABS Take 1 tablet by mouth daily.   OVER THE COUNTER MEDICATION zinc   OVER THE COUNTER MEDICATION Flaxseed oil   rosuvastatin (CRESTOR) 5 MG tablet Take 1 tablet (5 mg total) by mouth daily.   Facility-Administered Encounter Medications as of 03/13/2021  Medication   sodium chloride irrigation 0.9 %    Patient Active Problem List   Diagnosis Date Noted   Chronic pain of left knee 07/10/2020   History of pulmonary embolus (PE) 11/08/2018   History of DVT (deep vein thrombosis) 11/08/2018   Type 2 diabetes mellitus without complication, without long-term current use of insulin (HCC) 08/11/2018   Dyspnea on exertion 01/30/2017   Morbid obesity with BMI of 60.0-69.9, adult (HCC) 01/30/2017   Controlled substance agreement signed 09/11/2016   Chronic back pain 09/11/2016   Depression, recurrent (HCC) 09/11/2016   Fatty liver 05/17/2015   Elevated transaminase level 05/17/2015   Metabolic syndrome 02/01/2015   Hypertriglyceridemia 09/05/2013  Conditions to be addressed/monitored: monitor client management of medical needs. Monitor client management of depression issues  Care Plan : LCSW Care Plan  Updates made by Isaiah Blakes, LCSW since 03/13/2021 12:00 AM     Problem: Coping Skills (General Plan of Care)      Goal: Manage Health needs faced; Manage Weight Challenges; Manage Depression issues faced   Start Date: 03/13/2021   Expected End Date: 06/10/2021  This Visit's Progress: On track  Recent Progress: On track  Priority: Medium  Note:   Current barriers:   Patient in need of assistance with connecting to community resources for possible help in managing his ongoing medical needs Patient is unable to independently navigate community resource options without care coordination support Depression issues Weight challenges  Clinical Goals:  patient will work with SW  in next 30 days to address concerns related to client management of his current health conditions Client to talk with LCSW in next 30 days about mobility issues of client and pain issues of client  Client to communicate as needed with RNCM in next month to discuss nursing needs of client  Clinical Interventions:  Collaboration with Gwenlyn Fudge, FNP regarding development and update of comprehensive plan of care as evidenced by provider attestation and co-signature  Discussed pain issues of client. He spoke of occasional back pain issues.  Discussed mood of client. He said he may get sad for a short period  of time occasionally; but, overall, he feels his mood is fairly stable. He talks daily with his father.  He exercises regularly and tries to eat health meals. He is trying to take medications as prescribed Discussed energy of client. He said sometimes his energy level is a little low. Encouraged client to call RNCM as needed for nursing support Discussed relaxation techniques (karate, weightlifting, walking) Reviewed family support. Has support from his father and from his sister.  Provided counseling support for client LCSW talked with client in detail about [employer] Dual Complete Program.  LCSW encouraged client to call [employer] benefits to see if he may qualify for this insurance program.  Client is closely watching his diet and food intake. He said he has a blue card from [facility] which he uses for [employer] (buying health foods). Discussed  client use of diuretic for managing fluid. He said he uses a diuretic daily, takes I diuretic pill per day to help manage fluid.  Patient Strengths/Coping Skills: Exercises regularly Tries to eat a health diet  Patient Deficits Some Pain and mobility challenges  Patient Goals:  Over next 30 days, client will talk with LCSW about his coping strategies to manage his current health needs Over next 30 days , patient will attend all scheduled client medical appointments Over next 30 days, client will call RNCM or LCSW as needed for CCM support -  Follow Up Plan:  LCSW to call client on 05/13/21 at 4:00 PM to assess client needs     Kelton Pillar.Dotti Busey MSW, LCSW Licensed Clinical Social Worker [facility] Care Management [phone]

## 2021-03-13 NOTE — Patient Instructions (Addendum)
Visit Information  Patient goals:  Protect My Health (Patient). Manage health issues faced. Manage Depression issues faced  Timeframe:  Short-Term Goal Priority:  Medium Progress: On Track Start Date:            03/13/21                Expected End Date:          06/10/21            Follow Up Date 05/13/21 at 4:00 PM   Protect My Health (Patient)   Manage Health issues faced . Manage Depression issues faced   Why is this important?   Screening tests can find diseases early when they are easier to treat.  Your doctor or nurse will talk with you about which tests are important for you.  Getting shots for common diseases like the flu and shingles will help prevent them.   Patient Strengths/Coping Skills: Exercises regularly Tries to eat a health diet  Patient Deficits Some Pain and mobility challenges  Patient Goals:  Over next 30 days, client will talk with LCSW about his coping strategies to manage his current health needs Over next 30 days , patient will attend all scheduled client medical appointments Over next 30 days, client will call RNCM or LCSW as needed for CCM support -  Follow Up Plan:  LCSW to call client on 05/13/21 at 4:00 PM to assess client needs  Kelton Pillar.Geralynn Capri MSW, LCSW Licensed Clinical Social Worker [facility] Care Management [phone]

## 2021-03-24 ENCOUNTER — Other Ambulatory Visit: Payer: Self-pay | Admitting: Family Medicine

## 2021-03-24 DIAGNOSIS — S93602A Unspecified sprain of left foot, initial encounter: Secondary | ICD-10-CM | POA: Diagnosis not present

## 2021-03-24 DIAGNOSIS — R319 Hematuria, unspecified: Secondary | ICD-10-CM | POA: Diagnosis not present

## 2021-03-24 DIAGNOSIS — M79605 Pain in left leg: Secondary | ICD-10-CM | POA: Diagnosis not present

## 2021-03-24 DIAGNOSIS — M79672 Pain in left foot: Secondary | ICD-10-CM | POA: Diagnosis not present

## 2021-03-25 NOTE — Telephone Encounter (Signed)
Not on current med list, under reconciled meds Last OV 02/19/21 Next Ov 05/22/21

## 2021-03-25 NOTE — Telephone Encounter (Signed)
Please ask patient about Flomax. It is not on his current medication list.

## 2021-03-26 ENCOUNTER — Encounter: Payer: Self-pay | Admitting: Family Medicine

## 2021-03-26 ENCOUNTER — Ambulatory Visit (INDEPENDENT_AMBULATORY_CARE_PROVIDER_SITE_OTHER): Payer: Medicare Other

## 2021-03-26 ENCOUNTER — Ambulatory Visit (INDEPENDENT_AMBULATORY_CARE_PROVIDER_SITE_OTHER): Payer: Medicare Other | Admitting: Family Medicine

## 2021-03-26 VITALS — BP 122/62 | HR 108 | Temp 97.4°F | Ht 69.0 in | Wt >= 6400 oz

## 2021-03-26 DIAGNOSIS — R310 Gross hematuria: Secondary | ICD-10-CM | POA: Diagnosis not present

## 2021-03-26 DIAGNOSIS — R319 Hematuria, unspecified: Secondary | ICD-10-CM | POA: Diagnosis not present

## 2021-03-26 DIAGNOSIS — N3001 Acute cystitis with hematuria: Secondary | ICD-10-CM | POA: Diagnosis not present

## 2021-03-26 LAB — URINALYSIS, ROUTINE W REFLEX MICROSCOPIC
Bilirubin, UA: NEGATIVE
Glucose, UA: NEGATIVE
Ketones, UA: NEGATIVE
Nitrite, UA: POSITIVE — AB
Specific Gravity, UA: 1.015 (ref 1.005–1.030)
Urobilinogen, Ur: 0.2 mg/dL (ref 0.2–1.0)
pH, UA: 5.5 (ref 5.0–7.5)

## 2021-03-26 LAB — MICROSCOPIC EXAMINATION
RBC, Urine: >30 /hpf — AB (ref 0–2)
Renal Epithel, UA: NONE SEEN /hpf
WBC, UA: >30 /hpf — AB (ref 0–5)

## 2021-03-26 MED ORDER — CEPHALEXIN 500 MG PO CAPS: 500.0000 mg | ORAL_CAPSULE | Freq: Four times a day (QID) | ORAL | 0 refills | Status: AC

## 2021-03-26 NOTE — Telephone Encounter (Signed)
Lmtcb.

## 2021-03-26 NOTE — Progress Notes (Signed)
Assessment & Plan:  1. Acute cystitis with hematuria - cephALEXin (KEFLEX) 500 MG capsule; Take 1 capsule (500 mg total) by mouth 4 (four) times daily for 7 days.  Dispense: 28 capsule; Refill: 0  2. Gross hematuria Education provided on hematuria. - Urinalysis, Routine w reflex microscopic - Urine dipstick shows positive for RBC's, positive for protein, positive for nitrates, and positive for leukocytes.  Micro exam: >30 WBC's per HPF, >30 RBC's per HPF, and many bacteria. - Urine Culture - DG Abd 1 View - Microscopic Examination - Ambulatory referral to Urology   Follow up plan: Return if symptoms worsen or fail to improve.  [name], MSN, APRN, FNP-C [facility]  Subjective:   Patient ID: Justin Schmidt, male    DOB: 03/02/1981, 40 y.o.   MRN: [id number]  HPI: Justin Schmidt is a 40 y.o. male presenting on 03/26/2021 for Hematuria (03/24/21 [facility] - Hematuria ) and Generalized Body Aches (Patient states it started yesterday )  Patient is accompanied by his father, who he is okay with being present.   Patient was seen at urgent care two days ago when he started seeing blood in his urine. Urine dipstick showed positive for RBC's, positive for protein, and positive for leukocytes.  Micro exam: not done. Urine culture not done. He was advised to follow-up with PCP regarding hematuria.   Today he reports he is now experiencing back pain, body aches, and has felt like he may be running a fever although he hasn't checked it. He is requesting a referral to urology as this is not the first time this has occurred. Previously it was felt to be a kidney stone.   ROS: Negative unless specifically indicated above in HPI.   Relevant past medical history reviewed and updated as indicated.   Allergies and medications reviewed and updated.   Current Outpatient Medications:    Ascorbic Acid (VITAMIN C) 1000 MG tablet, Take 1,000 mg by mouth daily., Disp: , Rfl:     aspirin EC 81 MG tablet, Take 162 mg by mouth daily. , Disp: , Rfl:    Cholecalciferol (VITAMIN D) 2000 UNITS tablet, Take 2,000 Units by mouth daily., Disp: , Rfl:    CINNAMON PO, Take by mouth., Disp: , Rfl:    diclofenac (VOLTAREN) 50 MG EC tablet, Take 1 tablet (50 mg total) by mouth 2 (two) times daily., Disp: 180 tablet, Rfl: 2   escitalopram (LEXAPRO) 10 MG tablet, Take 1 tablet (10 mg total) by mouth daily., Disp: 90 tablet, Rfl: 1   fluticasone (FLONASE) 50 MCG/ACT nasal spray, Place 2 sprays into both nostrils daily., Disp: 48 g, Rfl: 4   furosemide (LASIX) 20 MG tablet, Take 1 tablet (20 mg total) by mouth daily., Disp: 90 tablet, Rfl: 1   GARLIC PO, Take by mouth., Disp: , Rfl:    ibuprofen (ADVIL) 800 MG tablet, Take one po tid x 10 days, Disp: , Rfl:    Multiple Vitamin (MULTIVITAMIN WITH MINERALS) TABS, Take 1 tablet by mouth daily., Disp: , Rfl:    OVER THE COUNTER MEDICATION, zinc, Disp: , Rfl:    OVER THE COUNTER MEDICATION, Flaxseed oil, Disp: , Rfl:    rosuvastatin (CRESTOR) 5 MG tablet, Take 1 tablet (5 mg total) by mouth daily., Disp: 30 tablet, Rfl: 2   tamsulosin (FLOMAX) 0.4 MG CAPS capsule, Take 0.4 mg by mouth every morning., Disp: , Rfl:    acetaminophen-codeine (TYLENOL #3) 300-30 MG tablet, Take 1  tablet by mouth daily as needed for moderate pain. (Patient not taking: Reported on 03/26/2021), Disp: 30 tablet, Rfl: 2 No current facility-administered medications for this visit.  Facility-Administered Medications Ordered in Other Visits:    sodium chloride irrigation 0.9 %, , , PRN, Tilford Pillar, MD, 3,000 mL at 01/27/12 0739  Allergies  Allergen Reactions   Metformin And Related     diarrhea   Xarelto [Rivaroxaban]     DROWSINESS, FATIGUE    Objective:   BP 122/62   Pulse (!) 108   Temp (!) 97.4 F (36.3 C) (Temporal)   Ht 5\' 9"  (1.753 m)   Wt (!) 441 lb 3.2 oz (200.1 kg)   SpO2 94%   BMI 65.15 kg/m    Physical Exam Vitals reviewed.   Constitutional:      General: He is not in acute distress.    Appearance: Normal appearance. He is not ill-appearing, toxic-appearing or diaphoretic.  HENT:     Head: Normocephalic and atraumatic.  Eyes:     General: No scleral icterus.       Right eye: No discharge.        Left eye: No discharge.     Conjunctiva/sclera: Conjunctivae normal.  Cardiovascular:     Rate and Rhythm: Normal rate.  Pulmonary:     Effort: Pulmonary effort is normal. No respiratory distress.  Abdominal:     Tenderness: There is right CVA tenderness and left CVA tenderness.  Musculoskeletal:        General: Normal range of motion.     Cervical back: Normal range of motion.  Skin:    General: Skin is warm and dry.  Neurological:     Mental Status: He is alert and oriented to person, place, and time. Mental status is at baseline.  Psychiatric:        Mood and Affect: Mood normal.        Behavior: Behavior normal.        Thought Content: Thought content normal.        Judgment: Judgment normal.

## 2021-03-27 DIAGNOSIS — R52 Pain, unspecified: Secondary | ICD-10-CM | POA: Diagnosis not present

## 2021-03-27 DIAGNOSIS — R0689 Other abnormalities of breathing: Secondary | ICD-10-CM | POA: Diagnosis not present

## 2021-03-27 DIAGNOSIS — R000 Tachycardia, unspecified: Secondary | ICD-10-CM | POA: Diagnosis not present

## 2021-03-27 DIAGNOSIS — R069 Unspecified abnormalities of breathing: Secondary | ICD-10-CM | POA: Diagnosis not present

## 2021-03-27 DIAGNOSIS — R319 Hematuria, unspecified: Secondary | ICD-10-CM | POA: Diagnosis not present

## 2021-03-29 LAB — URINE CULTURE

## 2021-04-02 NOTE — Progress Notes (Signed)
R/C About xray results

## 2021-04-08 ENCOUNTER — Ambulatory Visit: Payer: Medicare Other | Admitting: Urology

## 2021-04-22 ENCOUNTER — Other Ambulatory Visit: Payer: Self-pay | Admitting: Family Medicine

## 2021-04-22 DIAGNOSIS — J329 Chronic sinusitis, unspecified: Secondary | ICD-10-CM

## 2021-05-06 ENCOUNTER — Ambulatory Visit: Payer: Medicaid Other | Admitting: Urology

## 2021-05-06 NOTE — Progress Notes (Deleted)
° °  Assessment: No diagnosis found.   Plan: ***  Chief Complaint: No chief complaint on file.   History of Present Illness:  Justin Schmidt is a 41 y.o. year old male who is seen in consultation from Loman Brooklyn, FNP  for evaluation of ***.   Past Medical History:  Past Medical History:  Diagnosis Date   Acute saddle pulmonary embolism without acute cor pulmonale ([place]) 01/30/2017   Back pain    Gallstones    Hyperlipidemia    Type 2 diabetes mellitus without complication, without long-term current use of insulin ([place]) 08/11/2018   VTE (venous thromboembolism) 05/14/2017    Past Surgical History:  Past Surgical History:  Procedure Laterality Date   APPENDECTOMY  1991   CHOLECYSTECTOMY  01/23/2012   Procedure: LAPAROSCOPIC CHOLECYSTECTOMY;  Surgeon: Donato Heinz, MD;  Location: [facility];  Service: General;  Laterality: N/A;   TONSILLECTOMY      Allergies:  Allergies  Allergen Reactions   Metformin And Related     diarrhea   Xarelto [Rivaroxaban]     DROWSINESS, FATIGUE    Family History:  Family History  Problem Relation Age of Onset   Diabetes Mother    Diabetes Father    Hypertension Father    Hyperlipidemia Father    Migraines Sister    Diabetes Maternal Uncle    Diabetes Maternal Grandmother    Diabetes Other     Social History:  Social History   Tobacco Use   Smoking status: Never   Smokeless tobacco: Never  Vaping Use   Vaping Use: Never used  Substance Use Topics   Alcohol use: Yes    Alcohol/week: 1.0 standard drink    Types: 1 Cans of beer per week    Comment: very rare   Drug use: No    Review of symptoms:  Constitutional:  Negative for unexplained weight loss, night sweats, fever, chills ENT:  Negative for nose bleeds, sinus pain, painful swallowing CV:  Negative for chest pain, shortness of breath, exercise intolerance, palpitations, loss of consciousness Resp:  Negative for cough, wheezing, shortness of breath GI:  Negative  for nausea, vomiting, diarrhea, bloody stools GU:  Positives noted in HPI; otherwise negative for gross hematuria, dysuria, urinary incontinence Neuro:  Negative for seizures, poor balance, limb weakness, slurred speech Psych:  Negative for lack of energy, depression, anxiety Endocrine:  Negative for polydipsia, polyuria, symptoms of hypoglycemia (dizziness, hunger, sweating) Hematologic:  Negative for anemia, purpura, petechia, prolonged or excessive bleeding, use of anticoagulants  Allergic:  Negative for difficulty breathing or choking as a result of exposure to anything; no shellfish allergy; no allergic response (rash/itch) to materials, foods  Physical exam: There were no vitals taken for this visit. GENERAL APPEARANCE:  Well appearing, well developed, well nourished, NAD HEENT: Atraumatic, Normocephalic, oropharynx clear. NECK: Supple without lymphadenopathy or thyromegaly. LUNGS: Clear to auscultation bilaterally. HEART: Regular Rate and Rhythm without murmurs, gallops, or rubs. ABDOMEN: Soft, non-tender, No Masses. EXTREMITIES: Moves all extremities well.  Without clubbing, cyanosis, or edema. NEUROLOGIC:  Alert and oriented x 3, normal gait, CN II-XII grossly intact.  MENTAL STATUS:  Appropriate. BACK:  Non-tender to palpation.  No CVAT SKIN:  Warm, dry and intact.    Results: No results found for this or any previous visit (from the past 24 hour(s)).

## 2021-05-13 ENCOUNTER — Telehealth: Payer: Medicare Other

## 2021-05-20 ENCOUNTER — Other Ambulatory Visit: Payer: Self-pay | Admitting: Family Medicine

## 2021-05-20 DIAGNOSIS — E119 Type 2 diabetes mellitus without complications: Secondary | ICD-10-CM

## 2021-05-20 DIAGNOSIS — E781 Pure hyperglyceridemia: Secondary | ICD-10-CM

## 2021-05-22 ENCOUNTER — Ambulatory Visit (INDEPENDENT_AMBULATORY_CARE_PROVIDER_SITE_OTHER): Payer: Medicare Other | Admitting: Family Medicine

## 2021-05-22 ENCOUNTER — Encounter: Payer: Self-pay | Admitting: Family Medicine

## 2021-05-22 VITALS — BP 141/86 | HR 81 | Temp 97.8°F | Ht 69.0 in | Wt >= 6400 oz

## 2021-05-22 DIAGNOSIS — Z0001 Encounter for general adult medical examination with abnormal findings: Secondary | ICD-10-CM

## 2021-05-22 DIAGNOSIS — M545 Low back pain, unspecified: Secondary | ICD-10-CM | POA: Diagnosis not present

## 2021-05-22 DIAGNOSIS — E781 Pure hyperglyceridemia: Secondary | ICD-10-CM

## 2021-05-22 DIAGNOSIS — M25562 Pain in left knee: Secondary | ICD-10-CM

## 2021-05-22 DIAGNOSIS — G8929 Other chronic pain: Secondary | ICD-10-CM

## 2021-05-22 DIAGNOSIS — F419 Anxiety disorder, unspecified: Secondary | ICD-10-CM

## 2021-05-22 DIAGNOSIS — F339 Major depressive disorder, recurrent, unspecified: Secondary | ICD-10-CM

## 2021-05-22 DIAGNOSIS — E119 Type 2 diabetes mellitus without complications: Secondary | ICD-10-CM | POA: Diagnosis not present

## 2021-05-22 DIAGNOSIS — Z79899 Other long term (current) drug therapy: Secondary | ICD-10-CM | POA: Diagnosis not present

## 2021-05-22 DIAGNOSIS — Z6841 Body Mass Index (BMI) 40.0 and over, adult: Secondary | ICD-10-CM

## 2021-05-22 DIAGNOSIS — Z0000 Encounter for general adult medical examination without abnormal findings: Secondary | ICD-10-CM

## 2021-05-22 LAB — BAYER DCA HB A1C WAIVED: HB A1C (BAYER DCA - WAIVED): 6.1 % — ABNORMAL HIGH (ref 4.8–5.6)

## 2021-05-22 MED ORDER — ACETAMINOPHEN-CODEINE #3 300-30 MG PO TABS: 1.0000 | ORAL_TABLET | Freq: Every day | ORAL | 2 refills | Status: DC | PRN

## 2021-05-22 MED ORDER — SILVER SULFADIAZINE 1 % EX CREA: 1.0000 "application " | TOPICAL_CREAM | Freq: Every day | CUTANEOUS | 0 refills | Status: DC

## 2021-05-22 NOTE — Progress Notes (Signed)
Assessment & Plan:  1. Well adult exam - printed wellness information provided - encouraged to continue healthy diet and exercise  2. Type 2 diabetes mellitus without complication, without long-term current use of insulin (HCC) - well controlled with diet and exercise - CBC with Differential/Platelet - CMP14+EGFR - Bayer DCA Hb A1c Waived, 6.1 today  3. Hypertriglyceridemia - controlled with rosuvastatin, ASCVD risk score is 3.3% - Lipid panel  4-6. Chronic pain of left knee/ Chronic midline low back pain without sciatica/Controlled substance agreement signed Well controlled on current regimen. Controlled substance agreement in place. Urine drug screen as expected. PDMP reviewed with no concerning findings.  - acetaminophen-codeine (TYLENOL #3) 300-30 MG tablet; Take 1 tablet by mouth daily as needed for moderate pain.  Dispense: 30 tablet; Refill: 2  7. Morbid obesity with BMI of 60.0-69.9, adult ([place]) - congratulated on 32 lb weight loss since November  - discussed benefit of weight loss medication to assist weight loss, patient states he will consider and let us know  8-9. Depression/Anxiety Well controlled on current regimen.    Follow-up: Return in about 3 months (around 08/20/2021) for follow-up of chronic medication conditions.   Lucile Crater, NP Student  I personally was present during the history, physical exam, and medical decision-making activities of this service and have verified that the service and findings are accurately documented in the nurse practitioner student's note.  [name], MSN, APRN, FNP-C [facility]  Subjective:  Patient ID: Justin Schmidt, male    DOB: 1980-11-17  Age: 41 y.o. MRN: [id number]  Patient Care Team: Loman Brooklyn, FNP as PCP - General (Family Medicine) Ilean China, RN as Registered Nurse Pruitt, Royce Macadamia, [facility] (Pharmacist) Shea Evans Norva Riffle, LCSW as Social Worker (Licensed Clinical Social  Worker) [place] Comment, Allison Quarry, MD as Referring Physician (Optometry)   CC:  Chief Complaint  Patient presents with   Annual Exam    HPI SHAYDE GERVACIO presents for annual physical and management of chronic medical conditions.   Diabetes: A1C in October 2022 6.5. He is not on medication to treat. He states since he had a UTI this fall he has turned his life around and has been exercising, eating very healthy, and not drinking soda. He has lost 32 lbs in 3 months.   Health Maintenance: Occupation: disabled, Marital status: single, Substance use: none Diet: healthy, Exercise: moderate at gym and walking Last eye exam: 2 years ago, will schedule here with [facility] Last dental exam: a few years ago Hepatitis C Screening: negative on 12/29/2020  Immunizations:  Flu Vaccine: declined Tdap Vaccine: up to date  COVID-19 Vaccine: declined Pneumonia Vaccine: declined  Depression/Anxiety: controlled with 10 mg Lexapro.   Depression screen [facility] 2/9 05/22/2021 03/26/2021 03/13/2021  Decreased Interest 0 1 2  Down, Depressed, Hopeless 0 1 2  PHQ - 2 Score 0 2 4  Altered sleeping 0 1 1  Tired, decreased energy 0 1 1  Change in appetite 0 1 1  Feeling bad or failure about yourself  0 1 1  Trouble concentrating 0 0 0  Moving slowly or fidgety/restless 0 0 0  Suicidal thoughts 0 0 0  PHQ-9 Score 0 6 8  Difficult doing work/chores Not difficult at all Very difficult Somewhat difficult  Some recent data might be hidden   GAD 7 : Generalized Anxiety Score 05/22/2021 03/26/2021 02/19/2021 07/10/2020  Nervous, Anxious, on Edge 0 '3 3 1  ' Control/stop worrying 0 3 3  1  Worry too much - different things 0 '3 3 1  ' Trouble relaxing 0 '3 1 1  ' Restless 0 '3 1 1  ' Easily annoyed or irritable 0 '3 2 1  ' Afraid - awful might happen 0 3 0 1  Total GAD 7 Score 0 '21 13 7  ' Anxiety Difficulty Not difficult at all Extremely difficult Very difficult Somewhat difficult   Pain assessment: Cause of pain- arthritis and morbid  obesity Pain location- chronic to low back and left knee, acute to right elbow Pain on scale of 1-10- 6-8/10 without medication Frequency- daily What increases pain- activity What makes pain better- rest, medication. Patient reports pain is improving with weight loss.  Effects on ADL- limits at times Any change in general medical condition- none   Current opioids rx- Tylenol #3 QHS PRN # meds rx- 30 Effectiveness of current meds- good  Adverse reactions from pain meds- none Morphine equivalent- 4.50 MME/day  Pill count performed-No Last drug screen - 07/10/2020 ( high risk q63m moderate risk q631mlow risk yearly ) Urine drug screen today- No Was the [place]- Yes  If yes were their any concerning findings? - No  Overdose risk: 60  Opioid Risk  04/20/2019  Alcohol 0  Illegal Drugs 0  Rx Drugs 0  Alcohol 0  Illegal Drugs 0  Rx Drugs 0  Age between 16-45 years  1  History of Preadolescent Sexual Abuse 0  Psychological Disease 0  Depression 1  Opioid Risk Tool Scoring 2  Opioid Risk Interpretation Low Risk   Pain contract signed on: 02/19/2021   Review of Systems  Constitutional:  Positive for weight loss (intentional). Negative for chills, fever and malaise/fatigue.  HENT: Negative.  Negative for congestion, ear discharge, ear pain, hearing loss and sinus pain.   Eyes: Negative.  Negative for blurred vision, pain, discharge and redness.  Respiratory: Negative.  Negative for cough, shortness of breath and wheezing.   Cardiovascular: Negative.  Negative for chest pain, palpitations, orthopnea and leg swelling.  Gastrointestinal: Negative.  Negative for abdominal pain, constipation, diarrhea, heartburn, nausea and vomiting.  Genitourinary: Negative.  Negative for dysuria, frequency and urgency.  Musculoskeletal:  Positive for joint pain (low back and knees) and myalgias (left foot sore on and off). Negative for back pain, falls and neck pain.  Skin: Negative.   Negative for itching and rash.  Neurological: Negative.  Negative for dizziness, tremors, weakness and headaches.  Endo/Heme/Allergies: Negative.  Negative for environmental allergies and polydipsia. Does not bruise/bleed easily.  Psychiatric/Behavioral: Negative.  Negative for depression, memory loss and substance abuse. The patient is not nervous/anxious and does not have insomnia.     Current Outpatient Medications:    Ascorbic Acid (VITAMIN C) 1000 MG tablet, Take 1,000 mg by mouth daily., Disp: , Rfl:    aspirin EC 81 MG tablet, Take 162 mg by mouth daily. , Disp: , Rfl:    Cholecalciferol (VITAMIN D) 2000 UNITS tablet, Take 2,000 Units by mouth daily., Disp: , Rfl:    CINNAMON PO, Take by mouth., Disp: , Rfl:    diclofenac (VOLTAREN) 50 MG EC tablet, Take 1 tablet (50 mg total) by mouth 2 (two) times daily., Disp: 180 tablet, Rfl: 2   escitalopram (LEXAPRO) 10 MG tablet, Take 1 tablet (10 mg total) by mouth daily., Disp: 90 tablet, Rfl: 1   fluticasone (FLONASE) 50 MCG/ACT nasal spray, SPRAY 2 SPRAYS INTO EACH NOSTRIL EVERY DAY, Disp: 48 mL, Rfl: 3   furosemide (LASIX)  20 MG tablet, Take 1 tablet (20 mg total) by mouth daily., Disp: 90 tablet, Rfl: 1   GARLIC PO, Take by mouth., Disp: , Rfl:    ibuprofen (ADVIL) 800 MG tablet, Take one po tid x 10 days, Disp: , Rfl:    Multiple Vitamin (MULTIVITAMIN WITH MINERALS) TABS, Take 1 tablet by mouth daily., Disp: , Rfl:    OVER THE COUNTER MEDICATION, zinc, Disp: , Rfl:    OVER THE COUNTER MEDICATION, Flaxseed oil, Disp: , Rfl:    rosuvastatin (CRESTOR) 5 MG tablet, TAKE 1 TABLET (5 MG TOTAL) BY MOUTH DAILY., Disp: 90 tablet, Rfl: 0   acetaminophen-codeine (TYLENOL #3) 300-30 MG tablet, Take 1 tablet by mouth daily as needed for moderate pain., Disp: 30 tablet, Rfl: 2 No current facility-administered medications for this visit.  Facility-Administered Medications Ordered in Other Visits:    sodium chloride irrigation 0.9 %, , , PRN, Chelsea Primus, MD, 3,000 mL at 01/27/12 8144  Allergies  Allergen Reactions   Metformin And Related     diarrhea   Xarelto [Rivaroxaban]     DROWSINESS, FATIGUE    Past Medical History:  Diagnosis Date   Acute saddle pulmonary embolism without acute cor pulmonale ([place]) 01/30/2017   Back pain    Gallstones    Hyperlipidemia    Type 2 diabetes mellitus without complication, without long-term current use of insulin ([place]) 08/11/2018   VTE (venous thromboembolism) 05/14/2017    Past Surgical History:  Procedure Laterality Date   APPENDECTOMY  1991   CHOLECYSTECTOMY  01/23/2012   Procedure: LAPAROSCOPIC CHOLECYSTECTOMY;  Surgeon: Donato Heinz, MD;  Location: [facility];  Service: General;  Laterality: N/A;   TONSILLECTOMY      Family History  Problem Relation Age of Onset   Diabetes Mother    Diabetes Father    Hypertension Father    Hyperlipidemia Father    Migraines Sister    Diabetes Maternal Uncle    Diabetes Maternal Grandmother    Diabetes Other     Social History   Socioeconomic History   Marital status: Single    Spouse name: Not on file   Number of children: Not on file   Years of education: 12th grade   Highest education level: High school graduate  Occupational History   Occupation: disabled  Tobacco Use   Smoking status: Never   Smokeless tobacco: Never  Vaping Use   Vaping Use: Never used  Substance and Sexual Activity   Alcohol use: Yes    Alcohol/week: 1.0 standard drink    Types: 1 Cans of beer per week    Comment: very rare   Drug use: No   Sexual activity: Never  Other Topics Concern   Not on file  Social History Narrative   Lives alone - no children   Social Determinants of Health   Financial Resource Strain: Low Risk    Difficulty of Paying Living Expenses: Not hard at all  Food Insecurity: No Food Insecurity   Worried About Charity fundraiser in the Last Year: Never true   Ran Out of Food in the Last Year: Never true  Transportation Needs:  No Transportation Needs   Lack of Transportation (Medical): No   Lack of Transportation (Non-Medical): No  Physical Activity: Sufficiently Active   Days of Exercise per Week: 7 days   Minutes of Exercise per Session: 30 min  Stress: Stress Concern Present   Feeling of Stress : To some extent  Social  Connections: Moderately Isolated   Frequency of Communication with Friends and Family: More than three times a week   Frequency of Social Gatherings with Friends and Family: Once a week   Attends Religious Services: Never   Marine scientist or Organizations: Yes   Attends Archivist Meetings: Never   Marital Status: Never married  Human resources officer Violence: Not At Risk   Fear of Current or Ex-Partner: No   Emotionally Abused: No   Physically Abused: No   Sexually Abused: No      Objective:    BP (!) 141/86    Pulse 81    Temp 97.8 F (36.6 C) (Temporal)    Ht '5\' 9"'  (1.753 m)    Wt (!) 189.9 kg    SpO2 93%    BMI 61.82 kg/m   Wt Readings from Last 3 Encounters:  05/22/21 (!) 418 lb 9.6 oz (189.9 kg)  03/26/21 (!) 441 lb 3.2 oz (200.1 kg)  02/19/21 (!) 450 lb (204.1 kg)    Physical Exam Vitals reviewed.  Constitutional:      General: He is not in acute distress.    Appearance: Normal appearance. He is morbidly obese. He is not ill-appearing, toxic-appearing or diaphoretic.  HENT:     Head: Normocephalic and atraumatic.     Right Ear: Tympanic membrane, ear canal and external ear normal. There is no impacted cerumen.     Left Ear: Tympanic membrane, ear canal and external ear normal. There is no impacted cerumen.     Nose: Nose normal. No congestion or rhinorrhea.     Mouth/Throat:     Mouth: Mucous membranes are moist.     Pharynx: Oropharynx is clear. No oropharyngeal exudate or posterior oropharyngeal erythema.  Eyes:     General: No scleral icterus.       Right eye: No discharge.        Left eye: No discharge.     Extraocular Movements: Extraocular  movements intact.     Conjunctiva/sclera: Conjunctivae normal.     Pupils: Pupils are equal, round, and reactive to light.  Cardiovascular:     Rate and Rhythm: Normal rate and regular rhythm.     Pulses: Normal pulses.     Heart sounds: Normal heart sounds. No murmur heard.   No friction rub. No gallop.  Pulmonary:     Effort: Pulmonary effort is normal. No respiratory distress.     Breath sounds: Normal breath sounds. No stridor. No wheezing, rhonchi or rales.  Abdominal:     General: Bowel sounds are normal. There is no distension.     Palpations: Abdomen is soft. There is no mass.     Tenderness: There is no abdominal tenderness.     Hernia: No hernia is present.  Musculoskeletal:        General: Normal range of motion.     Cervical back: Normal range of motion.     Right lower leg: No edema.     Left lower leg: No edema.  Skin:    General: Skin is warm and dry.     Capillary Refill: Capillary refill takes less than 2 seconds.     Comments: Bilateral lower extremity erythema, scaling, and hemosiderin staining.   Neurological:     General: No focal deficit present.     Mental Status: He is alert and oriented to person, place, and time. Mental status is at baseline.     Motor: No weakness.  Psychiatric:  Mood and Affect: Mood normal.        Behavior: Behavior normal.        Thought Content: Thought content normal.        Judgment: Judgment normal.    Lab Results  Component Value Date   TSH 1.540 08/22/2016   Lab Results  Component Value Date   WBC 5.7 02/19/2021   HGB 14.2 02/19/2021   HCT 41.3 02/19/2021   MCV 91 02/19/2021   PLT 267 02/19/2021   Lab Results  Component Value Date   NA 143 02/19/2021   K 4.4 02/19/2021   CO2 23 02/19/2021   GLUCOSE 122 (H) 02/19/2021   BUN 15 02/19/2021   CREATININE 1.03 02/19/2021   BILITOT 0.3 02/19/2021   ALKPHOS 62 02/19/2021   AST 27 02/19/2021   ALT 43 02/19/2021   PROT 7.1 02/19/2021   ALBUMIN 4.6 02/19/2021    CALCIUM 9.7 02/19/2021   ANIONGAP 6 08/12/2017   EGFR 94 02/19/2021   Lab Results  Component Value Date   CHOL 169 02/19/2021   Lab Results  Component Value Date   HDL 40 02/19/2021   Lab Results  Component Value Date   LDLCALC 101 (H) 02/19/2021   Lab Results  Component Value Date   TRIG 161 (H) 02/19/2021   Lab Results  Component Value Date   CHOLHDL 4.2 02/19/2021   Lab Results  Component Value Date   HGBA1C 6.5 (H) 02/19/2021

## 2021-05-23 LAB — CMP14+EGFR
ALT: 37 IU/L (ref 0–44)
AST: 24 IU/L (ref 0–40)
Albumin/Globulin Ratio: 1.8 (ref 1.2–2.2)
Albumin: 4.6 g/dL (ref 4.0–5.0)
Alkaline Phosphatase: 72 IU/L (ref 44–121)
BUN/Creatinine Ratio: 16 (ref 9–20)
BUN: 18 mg/dL (ref 6–24)
Bilirubin Total: 0.3 mg/dL (ref 0.0–1.2)
CO2: 25 mmol/L (ref 20–29)
Calcium: 9.6 mg/dL (ref 8.7–10.2)
Chloride: 105 mmol/L (ref 96–106)
Creatinine, Ser: 1.14 mg/dL (ref 0.76–1.27)
Globulin, Total: 2.6 g/dL (ref 1.5–4.5)
Glucose: 98 mg/dL (ref 70–99)
Potassium: 4.7 mmol/L (ref 3.5–5.2)
Sodium: 143 mmol/L (ref 134–144)
Total Protein: 7.2 g/dL (ref 6.0–8.5)
eGFR: 83 mL/min/{1.73_m2} (ref >59)

## 2021-05-23 LAB — CBC WITH DIFFERENTIAL/PLATELET
Basophils Absolute: 0.1 10*3/uL (ref 0.0–0.2)
Basos: 1 %
EOS (ABSOLUTE): 0.1 10*3/uL (ref 0.0–0.4)
Eos: 1 %
Hematocrit: 42.6 % (ref 37.5–51.0)
Hemoglobin: 14.3 g/dL (ref 13.0–17.7)
Immature Grans (Abs): 0 10*3/uL (ref 0.0–0.1)
Immature Granulocytes: 0 %
Lymphocytes Absolute: 1.4 10*3/uL (ref 0.7–3.1)
Lymphs: 24 %
MCH: 30.6 pg (ref 26.6–33.0)
MCHC: 33.6 g/dL (ref 31.5–35.7)
MCV: 91 fL (ref 79–97)
Monocytes Absolute: 0.5 10*3/uL (ref 0.1–0.9)
Monocytes: 8 %
Neutrophils Absolute: 3.7 10*3/uL (ref 1.4–7.0)
Neutrophils: 66 %
Platelets: 256 10*3/uL (ref 150–450)
RBC: 4.67 x10E6/uL (ref 4.14–5.80)
RDW: 13.1 % (ref 11.6–15.4)
WBC: 5.7 10*3/uL (ref 3.4–10.8)

## 2021-05-23 LAB — LIPID PANEL
Chol/HDL Ratio: 4.2 ratio (ref 0.0–5.0)
Cholesterol, Total: 179 mg/dL (ref 100–199)
HDL: 43 mg/dL (ref >39)
LDL Chol Calc (NIH): 118 mg/dL — ABNORMAL HIGH (ref 0–99)
Triglycerides: 100 mg/dL (ref 0–149)
VLDL Cholesterol Cal: 18 mg/dL (ref 5–40)

## 2021-05-27 ENCOUNTER — Encounter: Payer: Self-pay | Admitting: Family Medicine

## 2021-07-08 ENCOUNTER — Ambulatory Visit (INDEPENDENT_AMBULATORY_CARE_PROVIDER_SITE_OTHER): Payer: Medicare Other | Admitting: Licensed Clinical Social Worker

## 2021-07-08 DIAGNOSIS — Z86718 Personal history of other venous thrombosis and embolism: Secondary | ICD-10-CM

## 2021-07-08 DIAGNOSIS — Z86711 Personal history of pulmonary embolism: Secondary | ICD-10-CM

## 2021-07-08 DIAGNOSIS — E119 Type 2 diabetes mellitus without complications: Secondary | ICD-10-CM

## 2021-07-08 DIAGNOSIS — Z6841 Body Mass Index (BMI) 40.0 and over, adult: Secondary | ICD-10-CM

## 2021-07-08 DIAGNOSIS — R0609 Other forms of dyspnea: Secondary | ICD-10-CM

## 2021-07-08 DIAGNOSIS — F339 Major depressive disorder, recurrent, unspecified: Secondary | ICD-10-CM

## 2021-07-08 NOTE — Patient Instructions (Addendum)
Visit Information ? ?Patient Goals:  Protect My Health (Patient). Manage Health issues faced. Manage Depression issues faced ? ?Timeframe:  Short-Term Goal ?Priority:  Medium ?Progress: On Track ?Start Date:         07/08/21                  ?Expected End Date:         10/07/21           ? ?Follow Up Date 08/29/21 at 3:00 PM ?  ?Protect My Health (Patient)   Manage Health issues faced . Manage Depression issues faced ?  ?Why is this important?   ?Screening tests can find diseases early when they are easier to treat.  ?Your doctor or nurse will talk with you about which tests are important for you.  ?Getting shots for common diseases like the flu and shingles will help prevent them.  ? ?Patient Strengths/Coping Skills: ?Exercises regularly ?Tries to eat a health diet ? ?Patient Deficits ?Some Pain and mobility challenges ? ?Patient Goals:  ?Over next 30 days, client will talk with LCSW about his coping strategies to manage his current health needs ?Over next 30 days , patient will attend all scheduled client medical appointments ?Over next 30 days, client will call RNCM or LCSW as needed for CCM support ?-  ?Follow Up Plan:  LCSW to call client on 08/29/21 at 3:00 PM to assess client needs ? ?Kelton Pillar.Lenix Benoist MSW, LCSW ?Licensed Clinical Social Worker ?[facility] Care Management ?[phone] ?

## 2021-07-08 NOTE — Chronic Care Management (AMB) (Signed)
?Chronic Care Management  ? ? Clinical Social Work Note ? ?07/08/2021 ?Name: Justin Schmidt MRN: [id number] DOB: May 24, 1980 ? ?Justin Schmidt is a 41 y.o. year old male who is a primary care patient of Gwenlyn Fudge, FNP. The CCM team was consulted to assist the patient with chronic disease management and/or care coordination needs related to: [employer] .  ? ?Engaged with patient by telephone for follow up visit in response to provider referral for social work chronic care management and care coordination services.  ? ?Consent to Services:  ?The patient was given information about Chronic Care Management services, agreed to services, and gave verbal consent prior to initiation of services.  Please see initial visit note for detailed documentation.  ? ?Patient agreed to services and consent obtained.  ? ?Assessment: Review of patient past medical history, allergies, medications, and health status, including review of relevant consultants reports was performed today as part of a comprehensive evaluation and provision of chronic care management and care coordination services.    ? ?SDOH (Social Determinants of Health) assessments and interventions performed:  ?SDOH Interventions   ? ?Flowsheet Row Most Recent Value  ?SDOH Interventions   ?Stress Interventions Provide Counseling  [client has stress related to managing medical needs. He has stress over managing his weight]  ?Depression Interventions/Treatment  Counseling, Medication  ? ?  ?  ? ?Advanced Directives Status: See Vynca application for related entries. ? ?CCM Care Plan ? ?Allergies  ?Allergen Reactions  ? Metformin And Related   ?  diarrhea  ? Xarelto [Rivaroxaban]   ?  DROWSINESS, FATIGUE  ? ? ?Outpatient Encounter Medications as of 07/08/2021  ?Medication Sig  ? acetaminophen-codeine (TYLENOL #3) 300-30 MG tablet Take 1 tablet by mouth daily as needed for moderate pain.  ? Ascorbic Acid (VITAMIN C) 1000 MG tablet Take 1,000 mg by mouth daily.  ?  aspirin EC 81 MG tablet Take 162 mg by mouth daily.   ? Cholecalciferol (VITAMIN D) 2000 UNITS tablet Take 2,000 Units by mouth daily.  ? CINNAMON PO Take by mouth.  ? diclofenac (VOLTAREN) 50 MG EC tablet Take 1 tablet (50 mg total) by mouth 2 (two) times daily.  ? escitalopram (LEXAPRO) 10 MG tablet Take 1 tablet (10 mg total) by mouth daily.  ? fluticasone (FLONASE) 50 MCG/ACT nasal spray SPRAY 2 SPRAYS INTO EACH NOSTRIL EVERY DAY  ? furosemide (LASIX) 20 MG tablet Take 1 tablet (20 mg total) by mouth daily.  ? GARLIC PO Take by mouth.  ? ibuprofen (ADVIL) 800 MG tablet Take one po tid x 10 days  ? Multiple Vitamin (MULTIVITAMIN WITH MINERALS) TABS Take 1 tablet by mouth daily.  ? OVER THE COUNTER MEDICATION zinc  ? OVER THE COUNTER MEDICATION Flaxseed oil  ? rosuvastatin (CRESTOR) 5 MG tablet TAKE 1 TABLET (5 MG TOTAL) BY MOUTH DAILY.  ? silver sulfADIAZINE (SILVADENE) 1 % cream Apply 1 application topically daily.  ? ?Facility-Administered Encounter Medications as of 07/08/2021  ?Medication  ? sodium chloride irrigation 0.9 %  ? ? ?Patient Active Problem List  ? Diagnosis Date Noted  ? Chronic pain of left knee 07/10/2020  ? History of pulmonary embolus (PE) 11/08/2018  ? History of DVT (deep vein thrombosis) 11/08/2018  ? Type 2 diabetes mellitus without complication, without long-term current use of insulin (HCC) 08/11/2018  ? Dyspnea on exertion 01/30/2017  ? Morbid obesity with BMI of 60.0-69.9, adult (HCC) 01/30/2017  ? Controlled substance agreement signed 09/11/2016  ?  Chronic back pain 09/11/2016  ? Depression, recurrent (HCC) 09/11/2016  ? Fatty liver 05/17/2015  ? Elevated transaminase level 05/17/2015  ? Metabolic syndrome 02/01/2015  ? Hypertriglyceridemia 09/05/2013  ? ? ?Conditions to be addressed/monitored: monitor client management of ongoing medical needs ? ?Care Plan : LCSW Care Plan  ?Updates made by Isaiah Blakes, LCSW since 07/08/2021 12:00 AM  ?  ? ?Problem: Coping Skills (General Plan  of Care)   ?  ? ?Goal: Manage Health needs faced; Manage Weight Challenges; Manage Depression issues faced   ?Start Date: 07/08/2021  ?Expected End Date: 10/07/2021  ?This Visit's Progress: On track  ?Recent Progress: On track  ?Priority: Medium  ?Note:   ?Current barriers:   ?Patient in need of assistance with connecting to community resources for possible help in managing his ongoing medical needs ?Patient is unable to independently navigate community resource options without care coordination support ?Depression issues ?Weight challenges ? ?Clinical Goals:  ?patient will work with SW  in next 30 days to address concerns related to client management of his current health conditions ?Client to talk with LCSW in next 30 days about mobility issues of client and pain issues of client  ?Client to communicate as needed with RNCM in next month to discuss nursing needs of client ? ?Clinical Interventions:  ?Collaboration with Gwenlyn Fudge, FNP regarding development and update of comprehensive plan of care as evidenced by provider attestation and co-signature ? Discussed pain issues of client. He spoke of occasional back pain issues.  ?Discussed mood of client. He said he may get sad for a short period  of time occasionally; but, overall, he feels his mood is fairly stable. He talks daily with his father.  He exercises regularly and tries to eat healthy meals. He is trying to take medications as prescribed. He enjoys walking . He enjoys doing stretching exercises ?Encouraged client to call RNCM as needed for nursing support ?Encouraged [name] to call [facility] Triage Nurse as needed to discuss specific medical questions of client.or to discuss specific nursing concerns of client  ?Provided counseling support for client ?Reviewed food procurement of client. LCSW talked with [name] about [employer] and fact that client could use U card for utility payment or food purchases.  Client was not aware that he could use U card for  utility payment. He was appreciative of this information. ?Discussed edema issues with client. He said that edema issues had been improving lately.  ? ?Patient Strengths/Coping Skills: ?Exercises regularly ?Tries to eat a health diet ? ?Patient Deficits ?Some Pain and mobility challenges ? ?Patient Goals:  ?Over next 30 days, client will talk with LCSW about his coping strategies to manage his current health needs ?Over next 30 days , patient will attend all scheduled client medical appointments ?Over next 30 days, client will call RNCM or LCSW as needed for CCM support ?-  ?Follow Up Plan:  LCSW to call client on 08/29/21 at 3:00 PM to assess client needs ?  ?  ?Kelton Pillar.Orlan Aversa MSW, LCSW ?Licensed Clinical Social Worker ?[facility] Care Management ?[phone] ?

## 2021-08-18 ENCOUNTER — Other Ambulatory Visit: Payer: Self-pay | Admitting: Family Medicine

## 2021-08-18 DIAGNOSIS — E781 Pure hyperglyceridemia: Secondary | ICD-10-CM

## 2021-08-18 DIAGNOSIS — E119 Type 2 diabetes mellitus without complications: Secondary | ICD-10-CM

## 2021-08-20 ENCOUNTER — Encounter: Payer: Self-pay | Admitting: Family Medicine

## 2021-08-20 ENCOUNTER — Ambulatory Visit (INDEPENDENT_AMBULATORY_CARE_PROVIDER_SITE_OTHER): Payer: Medicare Other | Admitting: Family Medicine

## 2021-08-20 VITALS — BP 134/82 | HR 89 | Temp 97.4°F | Ht 69.0 in | Wt >= 6400 oz

## 2021-08-20 DIAGNOSIS — R609 Edema, unspecified: Secondary | ICD-10-CM | POA: Diagnosis not present

## 2021-08-20 DIAGNOSIS — M25562 Pain in left knee: Secondary | ICD-10-CM | POA: Diagnosis not present

## 2021-08-20 DIAGNOSIS — E781 Pure hyperglyceridemia: Secondary | ICD-10-CM

## 2021-08-20 DIAGNOSIS — Z79899 Other long term (current) drug therapy: Secondary | ICD-10-CM | POA: Diagnosis not present

## 2021-08-20 DIAGNOSIS — M79672 Pain in left foot: Secondary | ICD-10-CM | POA: Diagnosis not present

## 2021-08-20 DIAGNOSIS — Z6841 Body Mass Index (BMI) 40.0 and over, adult: Secondary | ICD-10-CM

## 2021-08-20 DIAGNOSIS — G8929 Other chronic pain: Secondary | ICD-10-CM | POA: Diagnosis not present

## 2021-08-20 DIAGNOSIS — M545 Low back pain, unspecified: Secondary | ICD-10-CM

## 2021-08-20 DIAGNOSIS — E119 Type 2 diabetes mellitus without complications: Secondary | ICD-10-CM | POA: Diagnosis not present

## 2021-08-20 DIAGNOSIS — F339 Major depressive disorder, recurrent, unspecified: Secondary | ICD-10-CM

## 2021-08-20 LAB — BAYER DCA HB A1C WAIVED: HB A1C (BAYER DCA - WAIVED): 6.5 % — ABNORMAL HIGH (ref 4.8–5.6)

## 2021-08-20 MED ORDER — ACETAMINOPHEN-CODEINE #3 300-30 MG PO TABS: 1.0000 | ORAL_TABLET | Freq: Every day | ORAL | 2 refills | Status: DC | PRN

## 2021-08-20 MED ORDER — ESCITALOPRAM OXALATE 10 MG PO TABS: 10.0000 mg | ORAL_TABLET | Freq: Every day | ORAL | 1 refills | Status: DC

## 2021-08-20 MED ORDER — FUROSEMIDE 20 MG PO TABS: 20.0000 mg | ORAL_TABLET | Freq: Every day | ORAL | 1 refills | Status: DC

## 2021-08-20 NOTE — Progress Notes (Signed)
? ?Assessment & Plan:  ?1. Type 2 diabetes mellitus without complication, without long-term current use of insulin ([place]) ?Lab Results  ?Component Value Date  ? HGBA1C 6.5 (H) 08/20/2021  ? HGBA1C 6.1 (H) 05/22/2021  ? HGBA1C 6.5 (H) 02/19/2021  ?  ?- Diabetes is at goal of A1c < 7. ?- Medications:  not currently on medication ?- Patient is currently taking a statin. Patient is not taking an ACE-inhibitor/ARB.  ?- Instruction/counseling given: reminded to get eye exam, discussed foot care, and discussed diet ? ?Diabetes Health Maintenance Due  ?Topic Date Due  ? OPHTHALMOLOGY EXAM  Never done  ? HEMOGLOBIN A1C  02/19/2022  ? FOOT EXAM  08/21/2022  ?  ?Lab Results  ?Component Value Date  ? LABMICR 69.4 08/20/2021  ? LABMICR See below: 03/26/2021  ? ?- Lipid panel ?- CBC with Differential/Platelet ?- CMP14+EGFR ?- Bayer DCA Hb A1c Waived ?- Microalbumin / creatinine urine ratio ? ?2. Hypertriglyceridemia ?Well controlled on current regimen.  ?- Lipid panel ?- CBC with Differential/Platelet ?- CMP14+EGFR ? ?3-5. Chronic pain of left knee/Chronic midline low back pain without sciatica/Controlled substance agreement signed ?Well controlled on current regimen. Controlled substance agreement in place. Previous urine drug screen as expected; recollected today. PDMP reviewed with no concerning findings.  ?- CMP14+EGFR ?- acetaminophen-codeine (TYLENOL #3) 300-30 MG tablet; Take 1 tablet by mouth daily as needed for moderate pain.  Dispense: 30 tablet; Refill: 2 ?- ToxASSURE Select 13 (MW), Urine ? ?6. Depression, recurrent ([place]) ?Well controlled on current regimen.  ?- CMP14+EGFR ?- escitalopram (LEXAPRO) 10 MG tablet; Take 1 tablet (10 mg total) by mouth daily.  Dispense: 90 tablet; Refill: 1 ? ?7. Swelling ?Well controlled on current regimen.  ?- CMP14+EGFR ?- furosemide (LASIX) 20 MG tablet; Take 1 tablet (20 mg total) by mouth daily.  Dispense: 90 tablet; Refill: 1 ? ?8. Left foot pain ?- Ambulatory referral to  Podiatry ? ?9. Morbid obesity with BMI of 60.0-69.9, adult ([place]) ?Encouraged healthy eating and exercise. Education provided on the healthy plate method. Again dicussed Ozempic, but patient is not interested. ? ? ?Return in about 3 months (around 11/19/2021) for follow-up of chronic medication conditions. ? ?[name], MSN, APRN, FNP-C ?[facility] ? ?Subjective:  ? ? Patient ID: Justin Schmidt, male    DOB: 09-06-1980, 41 y.o.   MRN: [id number] ? ?Patient Care Team: ?Loman Brooklyn, FNP as PCP - General (Family Medicine) ?Ilean China, RN as Equities trader ?Lavera Guise, [facility] (Pharmacist) ?Katha Cabal, LCSW as Education officer, museum (Licensed Holiday representative) ?Harlen Labs, MD as Referring Physician (Optometry)  ? ?Chief Complaint:  ?Chief Complaint  ?Patient presents with  ? Medical Management of Chronic Issues  ? ? ?HPI: ?Justin Schmidt is a 41 y.o. male presenting on 08/20/2021 for Medical Management of Chronic Issues ? ?Patient is accompanied by his father, who he is okay with being present.  ? ?Pain assessment: ?Cause of pain- arthritis and morbid obesity ?Pain location- chronic to low back and left knee, acute to right elbow ?Pain on scale of 1-10- 6-8/10 without medication ?Frequency- daily ?What increases pain- activity ?What makes pain better- rest, medication ?Effects on ADL- limits at times ?Any change in general medical condition- none ?  ?Current opioids rx- Tylenol #3 QHS PRN ?# meds rx- 30 ?Effectiveness of current meds- good  ?Adverse reactions from pain meds- none ?Morphine equivalent- 4.50 MME/day ? ?Pill count performed-No ?Last drug screen - 07/10/2020 ?(  high risk q43m moderate risk q620mlow risk yearly ) ?Urine drug screen today- Yes ?[place] ?If yes were their any concerning findings? - No ? ?Overdose risk: 150 ? ? ?  04/20/2019  ? 10:07 PM  ?Opioid Risk   ?Alcohol 0  ?Illegal Drugs 0  ?Rx Drugs 0  ?Alcohol 0  ?Illegal Drugs 0  ?Rx  Drugs 0  ?Age between 1665-45ears  1  ?History of Preadolescent Sexual Abuse 0  ?Psychological Disease 0  ?Depression 1  ?Opioid Risk Tool Scoring 2  ?Opioid Risk Interpretation Low Risk  ? ?Pain contract signed on: 02/19/2021 ? ? ?Diabetes: Current symptoms include: none. Known diabetic complications: none. Medication compliance: not on medications at this time. Current diet: in general, a "healthy" diet  , states he is eating at home 90% of the time instead of eating out. He is decreasing carbs and increasing water intake . Current exercise: walking. Is he  on ACE inhibitor or angiotensin II receptor blocker? No. Is he on a statin? Yes (rosuvastatin).  ? ?Morbid Obesity: patient is eating healthy and exercising. He was previously prescribed Ozempic to help with weight loss but he did not take it because he wants to be able to lose weight on his own and not need a medication for it. ? ?New complaints: ?Patient reports pain in his left foot. Reports there is something there and when he gets it off it feels better for a little while, but then it comes back.  ? ? ?Social history: ? ?Relevant past medical, surgical, family and social history reviewed and updated as indicated. Interim medical history since our last visit reviewed. ? ?Allergies and medications reviewed and updated. ? ?DATA REVIEWED: CHART IN EPIC ? ?ROS: Negative unless specifically indicated above in HPI.  ? ? ?Current Outpatient Medications:  ?  acetaminophen-codeine (TYLENOL #3) 300-30 MG tablet, Take 1 tablet by mouth daily as needed for moderate pain., Disp: 30 tablet, Rfl: 2 ?  Ascorbic Acid (VITAMIN C) 1000 MG tablet, Take 1,000 mg by mouth daily., Disp: , Rfl:  ?  aspirin EC 81 MG tablet, Take 162 mg by mouth daily. , Disp: , Rfl:  ?  Cholecalciferol (VITAMIN D) 2000 UNITS tablet, Take 2,000 Units by mouth daily., Disp: , Rfl:  ?  CINNAMON PO, Take by mouth., Disp: , Rfl:  ?  diclofenac (VOLTAREN) 50 MG EC tablet, Take 1 tablet (50 mg total) by  mouth 2 (two) times daily., Disp: 180 tablet, Rfl: 2 ?  escitalopram (LEXAPRO) 10 MG tablet, Take 1 tablet (10 mg total) by mouth daily., Disp: 90 tablet, Rfl: 1 ?  fluticasone (FLONASE) 50 MCG/ACT nasal spray, SPRAY 2 SPRAYS INTO EACH NOSTRIL EVERY DAY, Disp: 48 mL, Rfl: 3 ?  furosemide (LASIX) 20 MG tablet, Take 1 tablet (20 mg total) by mouth daily., Disp: 90 tablet, Rfl: 1 ?  GARLIC PO, Take by mouth., Disp: , Rfl:  ?  ibuprofen (ADVIL) 800 MG tablet, Take one po tid x 10 days, Disp: , Rfl:  ?  Multiple Vitamin (MULTIVITAMIN WITH MINERALS) TABS, Take 1 tablet by mouth daily., Disp: , Rfl:  ?  OVER THE COUNTER MEDICATION, zinc, Disp: , Rfl:  ?  OVER THE COUNTER MEDICATION, Flaxseed oil, Disp: , Rfl:  ?  rosuvastatin (CRESTOR) 5 MG tablet, TAKE 1 TABLET (5 MG TOTAL) BY MOUTH DAILY., Disp: 90 tablet, Rfl: 0 ?  silver sulfADIAZINE (SILVADENE) 1 % cream, Apply 1 application topically daily., Disp:  85 g, Rfl: 0 ?No current facility-administered medications for this visit. ? ?Facility-Administered Medications Ordered in Other Visits:  ?  sodium chloride irrigation 0.9 %, , , PRN, Chelsea Primus, MD, 3,000 mL at 01/27/12 0739  ? ?Allergies  ?Allergen Reactions  ? Metformin And Related   ?  diarrhea  ? Xarelto [Rivaroxaban]   ?  DROWSINESS, FATIGUE  ? ?Past Medical History:  ?Diagnosis Date  ? Acute saddle pulmonary embolism without acute cor pulmonale ([place]) 01/30/2017  ? Back pain   ? Gallstones   ? Hyperlipidemia   ? Type 2 diabetes mellitus without complication, without long-term current use of insulin ([place]) 08/11/2018  ? VTE (venous thromboembolism) 05/14/2017  ?  ?Past Surgical History:  ?Procedure Laterality Date  ? APPENDECTOMY  1991  ? CHOLECYSTECTOMY  01/23/2012  ? Procedure: LAPAROSCOPIC CHOLECYSTECTOMY;  Surgeon: Donato Heinz, MD;  Location: [facility];  Service: General;  Laterality: N/A;  ? TONSILLECTOMY    ?  ?Social History  ? ?Socioeconomic History  ? Marital status: Single  ?  Spouse name: Not on file  ?  Number of children: Not on file  ? Years of education: 12th grade  ? Highest education level: High school graduate  ?Occupational History  ? Occupation: disabled  ?Tobacco Use  ? Smoking status: Never  ? Smokeless toba

## 2021-08-21 LAB — LIPID PANEL
Chol/HDL Ratio: 3.6 ratio (ref 0.0–5.0)
Cholesterol, Total: 173 mg/dL (ref 100–199)
HDL: 48 mg/dL (ref >39)
LDL Chol Calc (NIH): 105 mg/dL — ABNORMAL HIGH (ref 0–99)
Triglycerides: 110 mg/dL (ref 0–149)
VLDL Cholesterol Cal: 20 mg/dL (ref 5–40)

## 2021-08-21 LAB — CBC WITH DIFFERENTIAL/PLATELET
Basophils Absolute: 0.1 10*3/uL (ref 0.0–0.2)
Basos: 1 %
EOS (ABSOLUTE): 0.1 10*3/uL (ref 0.0–0.4)
Eos: 2 %
Hematocrit: 42.8 % (ref 37.5–51.0)
Hemoglobin: 14.5 g/dL (ref 13.0–17.7)
Immature Grans (Abs): 0 10*3/uL (ref 0.0–0.1)
Immature Granulocytes: 0 %
Lymphocytes Absolute: 1.4 10*3/uL (ref 0.7–3.1)
Lymphs: 25 %
MCH: 31.1 pg (ref 26.6–33.0)
MCHC: 33.9 g/dL (ref 31.5–35.7)
MCV: 92 fL (ref 79–97)
Monocytes Absolute: 0.5 10*3/uL (ref 0.1–0.9)
Monocytes: 8 %
Neutrophils Absolute: 3.6 10*3/uL (ref 1.4–7.0)
Neutrophils: 64 %
Platelets: 251 10*3/uL (ref 150–450)
RBC: 4.66 x10E6/uL (ref 4.14–5.80)
RDW: 13.1 % (ref 11.6–15.4)
WBC: 5.7 10*3/uL (ref 3.4–10.8)

## 2021-08-21 LAB — CMP14+EGFR
ALT: 49 IU/L — ABNORMAL HIGH (ref 0–44)
AST: 30 IU/L (ref 0–40)
Albumin/Globulin Ratio: 1.5 (ref 1.2–2.2)
Albumin: 4.1 g/dL (ref 4.0–5.0)
Alkaline Phosphatase: 63 IU/L (ref 44–121)
BUN/Creatinine Ratio: 12 (ref 9–20)
BUN: 13 mg/dL (ref 6–24)
Bilirubin Total: 0.3 mg/dL (ref 0.0–1.2)
CO2: 24 mmol/L (ref 20–29)
Calcium: 9.4 mg/dL (ref 8.7–10.2)
Chloride: 105 mmol/L (ref 96–106)
Creatinine, Ser: 1.13 mg/dL (ref 0.76–1.27)
Globulin, Total: 2.7 g/dL (ref 1.5–4.5)
Glucose: 116 mg/dL — ABNORMAL HIGH (ref 70–99)
Potassium: 4.6 mmol/L (ref 3.5–5.2)
Sodium: 144 mmol/L (ref 134–144)
Total Protein: 6.8 g/dL (ref 6.0–8.5)
eGFR: 84 mL/min/{1.73_m2} (ref >59)

## 2021-08-21 LAB — MICROALBUMIN / CREATININE URINE RATIO
Creatinine, Urine: 105.8 mg/dL
Microalb/Creat Ratio: 66 mg/g creat — ABNORMAL HIGH (ref 0–29)
Microalbumin, Urine: 69.4 ug/mL

## 2021-08-22 ENCOUNTER — Other Ambulatory Visit: Payer: Self-pay | Admitting: Family Medicine

## 2021-08-22 ENCOUNTER — Encounter: Payer: Self-pay | Admitting: Family Medicine

## 2021-08-22 DIAGNOSIS — E1121 Type 2 diabetes mellitus with diabetic nephropathy: Secondary | ICD-10-CM

## 2021-08-22 HISTORY — DX: Type 2 diabetes mellitus with diabetic nephropathy: E11.21

## 2021-08-22 MED ORDER — LISINOPRIL 10 MG PO TABS: 10.0000 mg | ORAL_TABLET | Freq: Every day | ORAL | 2 refills | Status: DC

## 2021-08-25 LAB — TOXASSURE SELECT 13 (MW), URINE

## 2021-08-27 ENCOUNTER — Encounter: Payer: Self-pay | Admitting: Family Medicine

## 2021-08-28 ENCOUNTER — Telehealth: Payer: Self-pay | Admitting: Family Medicine

## 2021-08-28 NOTE — Telephone Encounter (Signed)
Dad aware and verbalizes understanding.  

## 2021-08-29 ENCOUNTER — Telehealth: Payer: Medicare Other

## 2021-11-08 ENCOUNTER — Ambulatory Visit: Payer: Self-pay | Admitting: *Deleted

## 2021-11-08 NOTE — Chronic Care Management (AMB) (Signed)
  Chronic Care Management   Note  11/08/2021 Name: IHAN PAT MRN: [id number] DOB: [birth year]   Patient has either met RN Care Management goals, is stable from [place] Management perspective, or has not recently engaged with the RN Care Manager. I am removing RN Care Manager from Care Team and closing [place]. If patient is currently engaged with another CCM team member I will forward this encounter to inform them of my case closure. Patient may be eligible for re-engagement with RN Care Manager in the future if necessary and can discuss this with their PCP.  Chong Sicilian, BSN, RN-BC Embedded Chronic Care Manager [facility] / [place] Management Direct Dial: [phone]

## 2021-11-15 ENCOUNTER — Other Ambulatory Visit: Payer: Self-pay | Admitting: Family Medicine

## 2021-11-15 DIAGNOSIS — E119 Type 2 diabetes mellitus without complications: Secondary | ICD-10-CM

## 2021-11-15 DIAGNOSIS — E781 Pure hyperglyceridemia: Secondary | ICD-10-CM

## 2021-11-16 ENCOUNTER — Other Ambulatory Visit: Payer: Self-pay | Admitting: Family Medicine

## 2021-11-16 DIAGNOSIS — E1121 Type 2 diabetes mellitus with diabetic nephropathy: Secondary | ICD-10-CM

## 2021-11-19 ENCOUNTER — Ambulatory Visit (INDEPENDENT_AMBULATORY_CARE_PROVIDER_SITE_OTHER): Payer: Medicare Other | Admitting: Family Medicine

## 2021-11-19 ENCOUNTER — Encounter: Payer: Self-pay | Admitting: Family Medicine

## 2021-11-19 ENCOUNTER — Telehealth: Payer: Self-pay | Admitting: Family Medicine

## 2021-11-19 VITALS — BP 136/84 | HR 77 | Temp 97.9°F | Ht 69.0 in | Wt >= 6400 oz

## 2021-11-19 DIAGNOSIS — Z6841 Body Mass Index (BMI) 40.0 and over, adult: Secondary | ICD-10-CM

## 2021-11-19 DIAGNOSIS — F339 Major depressive disorder, recurrent, unspecified: Secondary | ICD-10-CM

## 2021-11-19 DIAGNOSIS — E1121 Type 2 diabetes mellitus with diabetic nephropathy: Secondary | ICD-10-CM

## 2021-11-19 DIAGNOSIS — L03116 Cellulitis of left lower limb: Secondary | ICD-10-CM

## 2021-11-19 DIAGNOSIS — L03115 Cellulitis of right lower limb: Secondary | ICD-10-CM | POA: Diagnosis not present

## 2021-11-19 DIAGNOSIS — M545 Low back pain, unspecified: Secondary | ICD-10-CM | POA: Diagnosis not present

## 2021-11-19 DIAGNOSIS — G8929 Other chronic pain: Secondary | ICD-10-CM | POA: Diagnosis not present

## 2021-11-19 DIAGNOSIS — E119 Type 2 diabetes mellitus without complications: Secondary | ICD-10-CM

## 2021-11-19 DIAGNOSIS — E781 Pure hyperglyceridemia: Secondary | ICD-10-CM | POA: Diagnosis not present

## 2021-11-19 DIAGNOSIS — Z79899 Other long term (current) drug therapy: Secondary | ICD-10-CM

## 2021-11-19 DIAGNOSIS — M25562 Pain in left knee: Secondary | ICD-10-CM

## 2021-11-19 LAB — BAYER DCA HB A1C WAIVED: HB A1C (BAYER DCA - WAIVED): 6.9 % — ABNORMAL HIGH (ref 4.8–5.6)

## 2021-11-19 MED ORDER — SULFAMETHOXAZOLE-TRIMETHOPRIM 800-160 MG PO TABS: 1.0000 | ORAL_TABLET | Freq: Two times a day (BID) | ORAL | 0 refills | Status: AC

## 2021-11-19 MED ORDER — DICLOFENAC SODIUM 50 MG PO TBEC
50.0000 mg | DELAYED_RELEASE_TABLET | Freq: Two times a day (BID) | ORAL | 1 refills | Status: DC
Start: 2021-11-19 — End: 2022-06-05

## 2021-11-19 MED ORDER — LISINOPRIL 10 MG PO TABS: 10.0000 mg | ORAL_TABLET | Freq: Every day | ORAL | 1 refills | Status: DC

## 2021-11-19 NOTE — Telephone Encounter (Signed)
Attempted to contact patient - NA  When patient calls back let him know MMM is not taking patients. c

## 2021-11-19 NOTE — Progress Notes (Signed)
Assessment & Plan:  1. Type 2 diabetes mellitus without complication, without long-term current use of insulin (HCC) Lab Results  Component Value Date   HGBA1C 6.9 (H) 11/19/2021   HGBA1C 6.5 (H) 08/20/2021   HGBA1C 6.1 (H) 05/22/2021    - Diabetes is at goal of A1c < 7. - Medications:  not currently on medication - Patient is currently taking a statin. Patient is taking an ACE-inhibitor/ARB.  - Instruction/counseling given: reminded to get eye exam, discussed the need for weight loss, and discussed diet. Patient is going to get retinal imaging completed in our office next month.  Diabetes Health Maintenance Due  Topic Date Due   OPHTHALMOLOGY EXAM  Never done   HEMOGLOBIN A1C  05/22/2022   FOOT EXAM  08/21/2022    Lab Results  Component Value Date   LABMICR 69.4 08/20/2021   LABMICR See below: 03/26/2021   - Lipid panel - CBC with Differential/Platelet - CMP14+EGFR - Bayer DCA Hb A1c Waived  2. Diabetic nephropathy associated with type 2 diabetes mellitus (HCC) - CMP14+EGFR - lisinopril (ZESTRIL) 10 MG tablet; Take 1 tablet (10 mg total) by mouth daily.  Dispense: 90 tablet; Refill: 1  3. Hypertriglyceridemia - Lipid panel - CBC with Differential/Platelet - CMP14+EGFR  4. Morbid obesity with BMI of 60.0-69.9, adult [facility]) Patient is not interested in medication to help with weight loss. Encouraged healthy eating and exercise. - Lipid panel - CBC with Differential/Platelet - CMP14+EGFR  5-7. Chronic pain of left knee/Chronic midline low back pain without sciatica/Controlled substance agreement signed Well controlled on current regimen. Controlled substance agreement in place. Urine drug screen as expected. PDMP reviewed with no concerning findings.  - CMP14+EGFR - diclofenac (VOLTAREN) 50 MG EC tablet; Take 1 tablet (50 mg total) by mouth 2 (two) times daily.  Dispense: 180 tablet; Refill: 1  8. Depression, recurrent ([place]) Well controlled on current regimen.  -  CMP14+EGFR  9. Bilateral lower leg cellulitis - sulfamethoxazole-trimethoprim (BACTRIM DS) 800-160 MG tablet; Take 1 tablet by mouth 2 (two) times daily for 7 days.  Dispense: 14 tablet; Refill: 0   Return in about 3 months (around 02/19/2022) for follow-up of chronic medication conditions.  [name], MSN, APRN, FNP-C [facility]  Subjective:    Patient ID: Justin Schmidt, male    DOB: [birth year], 41 y.o.   MRN: [id number]  Patient Care Team: Loman Brooklyn, FNP as PCP - General (Family Medicine) Shea Evans, Norva Riffle, LCSW as Social Worker (Licensed Clinical Social Worker) Marin Comment, Allison Quarry, MD as Referring Physician (Optometry)   Chief Complaint:  Chief Complaint  Patient presents with   Medical Management of Chronic Issues    HPI: Justin Schmidt is a 41 y.o. male presenting on 11/19/2021 for Medical Management of Chronic Issues  Patient is accompanied by his father, who he is okay with being present.   Pain assessment: Cause of pain- arthritis and morbid obesity Pain location- chronic to low back and left knee Pain on scale of 1-10- 6-8/10 without medication Frequency- daily What increases pain- activity What makes pain better- rest, medication Effects on ADL- limits at times Any change in general medical condition- none   Current opioids rx- Tylenol #3 daily PRN # meds rx- 30 Effectiveness of current meds- good  Adverse reactions from pain meds- none Morphine equivalent- 4.50 MME/day  Pill count performed-No Last drug screen - 08/20/2021 ( high risk q30m moderate risk q61mlow risk yearly ) Urine drug screen  today- No Was the [place] reviewed- Yes If yes were their any concerning findings? - No  Overdose risk: 130     04/20/2019   10:07 PM  Opioid Risk   Alcohol 0  Illegal Drugs 0  Rx Drugs 0  Alcohol 0  Illegal Drugs 0  Rx Drugs 0  Age between 5-45 years  1  History of Preadolescent Sexual Abuse 0  Psychological Disease 0   Depression 1  Opioid Risk Tool Scoring 2  Opioid Risk Interpretation Low Risk   Pain contract signed on: 02/19/2021   Diabetes: Current symptoms include: none. Known diabetic complications: nephropathy. Medication compliance: not on medications at this time. Current diet: in general, a "healthy" diet  , three small meals with healthy snacks; limiting fast food to 1-2 times per couple weeks . Current exercise: walking and chasing goats. Is he  on ACE inhibitor or angiotensin II receptor blocker? Yes (Lisinopril). Is he on a statin? Yes (rosuvastatin).   Morbid Obesity: patient is eating healthy and exercising. He was previously prescribed Ozempic to help with weight loss but he did not take it because he wants to be able to lose weight on his own and not need a medication for it.  Depression: doing well on Lexapro.     08/20/2021    3:41 PM 07/08/2021    2:02 PM 05/22/2021    3:12 PM  Depression screen PHQ 2/9  Decreased Interest 0 1 0  Down, Depressed, Hopeless 0 1 0  PHQ - 2 Score 0 2 0  Altered sleeping 0 1 0  Tired, decreased energy 0 1 0  Change in appetite 0 0 0  Feeling bad or failure about yourself  0 1 0  Trouble concentrating 0 1 0  Moving slowly or fidgety/restless 0 1 0  Suicidal thoughts 0 0 0  PHQ-9 Score 0 7 0  Difficult doing work/chores Not difficult at all Somewhat difficult Not difficult at all      05/22/2021    5:37 PM 03/26/2021    2:44 PM 02/19/2021    3:40 PM 07/10/2020    3:07 PM  GAD 7 : Generalized Anxiety Score  Nervous, Anxious, on Edge 0 '3 3 1  ' Control/stop worrying 0 '3 3 1  ' Worry too much - different things 0 '3 3 1  ' Trouble relaxing 0 '3 1 1  ' Restless 0 '3 1 1  ' Easily annoyed or irritable 0 '3 2 1  ' Afraid - awful might happen 0 3 0 1  Total GAD 7 Score 0 '21 13 7  ' Anxiety Difficulty Not difficult at all Extremely difficult Very difficult Somewhat difficult    New complaints: None   Social history:  Relevant past medical, surgical, family  and social history reviewed and updated as indicated. Interim medical history since our last visit reviewed.  Allergies and medications reviewed and updated.  DATA REVIEWED: CHART IN EPIC  ROS: Negative unless specifically indicated above in HPI.    Current Outpatient Medications:    acetaminophen-codeine (TYLENOL #3) 300-30 MG tablet, Take 1 tablet by mouth daily as needed for moderate pain., Disp: 30 tablet, Rfl: 2   Ascorbic Acid (VITAMIN C) 1000 MG tablet, Take 1,000 mg by mouth daily., Disp: , Rfl:    aspirin EC 81 MG tablet, Take 162 mg by mouth daily. , Disp: , Rfl:    Cholecalciferol (VITAMIN D) 2000 UNITS tablet, Take 2,000 Units by mouth daily., Disp: , Rfl:    CINNAMON PO, Take by  mouth., Disp: , Rfl:    diclofenac (VOLTAREN) 50 MG EC tablet, Take 1 tablet (50 mg total) by mouth 2 (two) times daily., Disp: 180 tablet, Rfl: 2   escitalopram (LEXAPRO) 10 MG tablet, Take 1 tablet (10 mg total) by mouth daily., Disp: 90 tablet, Rfl: 1   fluticasone (FLONASE) 50 MCG/ACT nasal spray, SPRAY 2 SPRAYS INTO EACH NOSTRIL EVERY DAY, Disp: 48 mL, Rfl: 3   furosemide (LASIX) 20 MG tablet, Take 1 tablet (20 mg total) by mouth daily., Disp: 90 tablet, Rfl: 1   GARLIC PO, Take by mouth., Disp: , Rfl:    ibuprofen (ADVIL) 800 MG tablet, Take one po tid x 10 days, Disp: , Rfl:    lisinopril (ZESTRIL) 10 MG tablet, TAKE 1 TABLET BY MOUTH EVERY DAY, Disp: 90 tablet, Rfl: 0   Multiple Vitamin (MULTIVITAMIN WITH MINERALS) TABS, Take 1 tablet by mouth daily., Disp: , Rfl:    OVER THE COUNTER MEDICATION, zinc, Disp: , Rfl:    OVER THE COUNTER MEDICATION, Flaxseed oil, Disp: , Rfl:    rosuvastatin (CRESTOR) 5 MG tablet, TAKE 1 TABLET (5 MG TOTAL) BY MOUTH DAILY., Disp: 90 tablet, Rfl: 0   silver sulfADIAZINE (SILVADENE) 1 % cream, Apply 1 application topically daily., Disp: 85 g, Rfl: 0 No current facility-administered medications for this visit.  Facility-Administered Medications Ordered in Other  Visits:    sodium chloride irrigation 0.9 %, , , PRN, Chelsea Primus, MD, 3,000 mL at 01/27/12 3016   Allergies  Allergen Reactions   Metformin And Related     diarrhea   Xarelto [Rivaroxaban]     DROWSINESS, FATIGUE   Past Medical History:  Diagnosis Date   Acute saddle pulmonary embolism without acute cor pulmonale ([place]) 01/30/2017   Back pain    Diabetic nephropathy ([place]) 08/22/2021   Gallstones    Hyperlipidemia    Type 2 diabetes mellitus without complication, without long-term current use of insulin ([place]) 08/11/2018   VTE (venous thromboembolism) 05/14/2017    Past Surgical History:  Procedure Laterality Date   APPENDECTOMY  1991   CHOLECYSTECTOMY  01/23/2012   Procedure: LAPAROSCOPIC CHOLECYSTECTOMY;  Surgeon: Donato Heinz, MD;  Location: [facility];  Service: General;  Laterality: N/A;   TONSILLECTOMY      Social History   Socioeconomic History   Marital status: Single    Spouse name: Not on file   Number of children: Not on file   Years of education: 12th grade   Highest education level: High school graduate  Occupational History   Occupation: disabled  Tobacco Use   Smoking status: Never   Smokeless tobacco: Never  Vaping Use   Vaping Use: Never used  Substance and Sexual Activity   Alcohol use: Yes    Alcohol/week: 1.0 standard drink of alcohol    Types: 1 Cans of beer per week    Comment: very rare   Drug use: No   Sexual activity: Never  Other Topics Concern   Not on file  Social History Narrative   Lives alone - no children   Social Determinants of Health   Financial Resource Strain: Low Risk  (12/13/2020)   Overall Financial Resource Strain (CARDIA)    Difficulty of Paying Living Expenses: Not hard at all  Food Insecurity: No Food Insecurity (12/13/2020)   Hunger Vital Sign    Worried About Running Out of Food in the Last Year: Never true    Ran Out of Food in the Last Year: Never  true  Transportation Needs: No Transportation Needs (12/13/2020)    PRAPARE - Hydrologist (Medical): No    Lack of Transportation (Non-Medical): No  Physical Activity: Sufficiently Active (12/13/2020)   Exercise Vital Sign    Days of Exercise per Week: 7 days    Minutes of Exercise per Session: 30 min  Stress: Stress Concern Present (07/08/2021)   [place]    Feeling of Stress : To some extent  Social Connections: Moderately Isolated (12/13/2020)   Social Connection and Isolation Panel [NHANES]    Frequency of Communication with Friends and Family: More than three times a week    Frequency of Social Gatherings with Friends and Family: Once a week    Attends Religious Services: Never    Marine scientist or Organizations: Yes    Attends Archivist Meetings: Never    Marital Status: Never married  Intimate Partner Violence: Not At Risk (12/13/2020)   Humiliation, Afraid, Rape, and Kick questionnaire    Fear of Current or Ex-Partner: No    Emotionally Abused: No    Physically Abused: No    Sexually Abused: No       Objective:    BP 136/84   Pulse 77   Temp 97.9 F (36.6 C) (Temporal)   Ht '5\' 9"'  (4.315 m)   Wt (!) 442 lb 9.6 oz (200.8 kg)   SpO2 94%   BMI 65.36 kg/m   Wt Readings from Last 3 Encounters:  11/19/21 (!) 442 lb 9.6 oz (200.8 kg)  08/20/21 (!) 440 lb 6.4 oz (199.8 kg)  05/22/21 (!) 418 lb 9.6 oz (189.9 kg)    Physical Exam Vitals reviewed.  Constitutional:      General: He is not in acute distress.    Appearance: Normal appearance. He is morbidly obese. He is not ill-appearing, toxic-appearing or diaphoretic.  HENT:     Head: Normocephalic and atraumatic.  Eyes:     General: No scleral icterus.       Right eye: No discharge.        Left eye: No discharge.     Conjunctiva/sclera: Conjunctivae normal.  Cardiovascular:     Rate and Rhythm: Normal rate and regular rhythm.     Heart sounds: Normal heart sounds. No  murmur heard.    No friction rub. No gallop.  Pulmonary:     Effort: Pulmonary effort is normal. No respiratory distress.     Breath sounds: Normal breath sounds. No stridor. No wheezing, rhonchi or rales.  Musculoskeletal:        General: Normal range of motion.     Right elbow: No swelling, deformity, effusion or lacerations. Normal range of motion. Tenderness present.     Cervical back: Normal range of motion.     Right lower leg: Edema present.     Left lower leg: Edema present.  Skin:    General: Skin is warm and dry.     Findings: Erythema (swelling and warmth to BLE) present.  Neurological:     Mental Status: He is alert and oriented to person, place, and time. Mental status is at baseline.  Psychiatric:        Mood and Affect: Mood normal.        Behavior: Behavior normal.        Thought Content: Thought content normal.        Judgment: Judgment normal.    Lab  Results  Component Value Date   TSH 1.540 08/22/2016   Lab Results  Component Value Date   WBC 5.7 08/20/2021   HGB 14.5 08/20/2021   HCT 42.8 08/20/2021   MCV 92 08/20/2021   PLT 251 08/20/2021   Lab Results  Component Value Date   NA 144 08/20/2021   K 4.6 08/20/2021   CO2 24 08/20/2021   GLUCOSE 116 (H) 08/20/2021   BUN 13 08/20/2021   CREATININE 1.13 08/20/2021   BILITOT 0.3 08/20/2021   ALKPHOS 63 08/20/2021   AST 30 08/20/2021   ALT 49 (H) 08/20/2021   PROT 6.8 08/20/2021   ALBUMIN 4.1 08/20/2021   CALCIUM 9.4 08/20/2021   ANIONGAP 6 08/12/2017   EGFR 84 08/20/2021   Lab Results  Component Value Date   CHOL 173 08/20/2021   Lab Results  Component Value Date   HDL 48 08/20/2021   Lab Results  Component Value Date   LDLCALC 105 (H) 08/20/2021   Lab Results  Component Value Date   TRIG 110 08/20/2021   Lab Results  Component Value Date   CHOLHDL 3.6 08/20/2021   Lab Results  Component Value Date   HGBA1C 6.5 (H) 08/20/2021

## 2021-11-19 NOTE — Telephone Encounter (Signed)
Cannot accept patient at this time.

## 2021-11-19 NOTE — Telephone Encounter (Signed)
Patient would like to see [name], since Alona Bene is leaving. His dad Justin Schmidt is a patient of [name] at this time. Please call back to let him know and to set up an appt.   [phone]

## 2021-11-19 NOTE — Patient Instructions (Signed)
Eye exam here at the office 12/05/2021 at 1:30 PM

## 2021-11-20 LAB — CBC WITH DIFFERENTIAL/PLATELET
Basophils Absolute: 0.1 10*3/uL (ref 0.0–0.2)
Basos: 1 %
EOS (ABSOLUTE): 0.1 10*3/uL (ref 0.0–0.4)
Eos: 1 %
Hematocrit: 41.5 % (ref 37.5–51.0)
Hemoglobin: 14.4 g/dL (ref 13.0–17.7)
Immature Grans (Abs): 0 10*3/uL (ref 0.0–0.1)
Immature Granulocytes: 0 %
Lymphocytes Absolute: 1.5 10*3/uL (ref 0.7–3.1)
Lymphs: 24 %
MCH: 31.6 pg (ref 26.6–33.0)
MCHC: 34.7 g/dL (ref 31.5–35.7)
MCV: 91 fL (ref 79–97)
Monocytes Absolute: 0.5 10*3/uL (ref 0.1–0.9)
Monocytes: 8 %
Neutrophils Absolute: 4.1 10*3/uL (ref 1.4–7.0)
Neutrophils: 66 %
Platelets: 268 10*3/uL (ref 150–450)
RBC: 4.55 x10E6/uL (ref 4.14–5.80)
RDW: 13.5 % (ref 11.6–15.4)
WBC: 6.3 10*3/uL (ref 3.4–10.8)

## 2021-11-20 LAB — CMP14+EGFR
ALT: 52 IU/L — ABNORMAL HIGH (ref 0–44)
AST: 26 IU/L (ref 0–40)
Albumin/Globulin Ratio: 1.6 (ref 1.2–2.2)
Albumin: 4.3 g/dL (ref 4.1–5.1)
Alkaline Phosphatase: 63 IU/L (ref 44–121)
BUN/Creatinine Ratio: 11 (ref 9–20)
BUN: 14 mg/dL (ref 6–24)
Bilirubin Total: 0.2 mg/dL (ref 0.0–1.2)
CO2: 22 mmol/L (ref 20–29)
Calcium: 9.2 mg/dL (ref 8.7–10.2)
Chloride: 106 mmol/L (ref 96–106)
Creatinine, Ser: 1.22 mg/dL (ref 0.76–1.27)
Globulin, Total: 2.7 g/dL (ref 1.5–4.5)
Glucose: 122 mg/dL — ABNORMAL HIGH (ref 70–99)
Potassium: 4.7 mmol/L (ref 3.5–5.2)
Sodium: 141 mmol/L (ref 134–144)
Total Protein: 7 g/dL (ref 6.0–8.5)
eGFR: 76 mL/min/{1.73_m2} (ref >59)

## 2021-11-20 LAB — LIPID PANEL
Chol/HDL Ratio: 3.7 ratio (ref 0.0–5.0)
Cholesterol, Total: 151 mg/dL (ref 100–199)
HDL: 41 mg/dL (ref >39)
LDL Chol Calc (NIH): 82 mg/dL (ref 0–99)
Triglycerides: 161 mg/dL — ABNORMAL HIGH (ref 0–149)
VLDL Cholesterol Cal: 28 mg/dL (ref 5–40)

## 2021-12-02 ENCOUNTER — Other Ambulatory Visit: Payer: Self-pay | Admitting: Family Medicine

## 2021-12-02 DIAGNOSIS — E781 Pure hyperglyceridemia: Secondary | ICD-10-CM

## 2021-12-02 DIAGNOSIS — M545 Low back pain, unspecified: Secondary | ICD-10-CM

## 2021-12-02 DIAGNOSIS — G8929 Other chronic pain: Secondary | ICD-10-CM

## 2021-12-02 DIAGNOSIS — Z79899 Other long term (current) drug therapy: Secondary | ICD-10-CM

## 2021-12-02 DIAGNOSIS — E119 Type 2 diabetes mellitus without complications: Secondary | ICD-10-CM

## 2021-12-16 ENCOUNTER — Ambulatory Visit (INDEPENDENT_AMBULATORY_CARE_PROVIDER_SITE_OTHER): Payer: Medicare Other

## 2021-12-16 VITALS — Wt >= 6400 oz

## 2021-12-16 DIAGNOSIS — Z0000 Encounter for general adult medical examination without abnormal findings: Secondary | ICD-10-CM

## 2021-12-16 NOTE — Progress Notes (Signed)
Subjective:   Justin Schmidt is a 41 y.o. male who presents for Medicare Annual/Subsequent preventive examination.  Virtual Visit via Telephone Note  I connected with  Justin Schmidt on 12/16/21 at  2:00 PM EDT by telephone and verified that I am speaking with the correct person using two identifiers.  Location: Patient: Home Provider: [facility] Persons participating in the virtual visit: patient/Nurse Health Advisor   I discussed the limitations, risks, security and privacy concerns of performing an evaluation and management service by telephone and the availability of in person appointments. The patient expressed understanding and agreed to proceed.  Interactive audio and video telecommunications were attempted between this nurse and patient, however failed, due to patient having technical difficulties OR patient did not have access to video capability.  We continued and completed visit with audio only.  Some vital signs may be absent or patient reported.   Indiya Izquierdo E Aiyah Scarpelli, LPN   Review of Systems     Cardiac Risk Factors include: diabetes mellitus;obesity (BMI >30kg/m2);male gender;Other (see comment);family history of premature cardiovascular disease, Risk factor comments: fatty liver, hx PE, hx DVT     Objective:    Today's Vitals   12/16/21 1507  Weight: (!) 440 lb (199.6 kg)   Body mass index is 64.98 kg/m.     12/16/2021    3:16 PM 12/13/2020    4:36 PM 08/12/2017    3:14 PM 05/04/2017    1:30 PM 03/14/2017    3:33 PM 01/29/2017    2:46 AM 01/28/2017    4:37 PM  Advanced Directives  Does Patient Have a Medical Advance Directive? No No No No No No No  Would patient like information on creating a medical advance directive? No - Patient declined No - Patient declined No - Patient declined No - Patient declined  No - Patient declined No - Patient declined    Current Medications (verified) Outpatient Encounter Medications as of 12/16/2021  Medication Sig   acetaminophen-codeine  (TYLENOL #3) 300-30 MG tablet Take 1 tablet by mouth daily as needed for moderate pain.   Ascorbic Acid (VITAMIN C) 1000 MG tablet Take 1,000 mg by mouth daily.   aspirin EC 81 MG tablet Take 162 mg by mouth daily.    Cholecalciferol (VITAMIN D) 2000 UNITS tablet Take 2,000 Units by mouth daily.   CINNAMON PO Take by mouth.   diclofenac (VOLTAREN) 50 MG EC tablet Take 1 tablet (50 mg total) by mouth 2 (two) times daily.   escitalopram (LEXAPRO) 10 MG tablet Take 1 tablet (10 mg total) by mouth daily.   fluticasone (FLONASE) 50 MCG/ACT nasal spray SPRAY 2 SPRAYS INTO EACH NOSTRIL EVERY DAY   furosemide (LASIX) 20 MG tablet Take 1 tablet (20 mg total) by mouth daily.   GARLIC PO Take by mouth.   ibuprofen (ADVIL) 800 MG tablet Take one po tid x 10 days   lisinopril (ZESTRIL) 10 MG tablet Take 1 tablet (10 mg total) by mouth daily.   Multiple Vitamin (MULTIVITAMIN WITH MINERALS) TABS Take 1 tablet by mouth daily.   OVER THE COUNTER MEDICATION zinc   OVER THE COUNTER MEDICATION Flaxseed oil   rosuvastatin (CRESTOR) 5 MG tablet TAKE 1 TABLET (5 MG TOTAL) BY MOUTH DAILY.   silver sulfADIAZINE (SILVADENE) 1 % cream Apply 1 application topically daily.   Facility-Administered Encounter Medications as of 12/16/2021  Medication   sodium chloride irrigation 0.9 %    Allergies (verified) Metformin and related and Xarelto [rivaroxaban]  History: Past Medical History:  Diagnosis Date   Acute saddle pulmonary embolism without acute cor pulmonale ([place]) 01/30/2017   Back pain    Diabetic nephropathy ([place]) 08/22/2021   Gallstones    Hyperlipidemia    Type 2 diabetes mellitus without complication, without long-term current use of insulin ([place]) 08/11/2018   VTE (venous thromboembolism) 05/14/2017   Past Surgical History:  Procedure Laterality Date   APPENDECTOMY  1991   CHOLECYSTECTOMY  01/23/2012   Procedure: LAPAROSCOPIC CHOLECYSTECTOMY;  Surgeon: Donato Heinz, MD;  Location: [facility];   Service: General;  Laterality: N/A;   TONSILLECTOMY     Family History  Problem Relation Age of Onset   Diabetes Mother    Diabetes Father    Hypertension Father    Hyperlipidemia Father    Migraines Sister    Diabetes Maternal Uncle    Diabetes Maternal Grandmother    Diabetes Other    Social History   Socioeconomic History   Marital status: Single    Spouse name: Not on file   Number of children: Not on file   Years of education: 12th grade   Highest education level: High school graduate  Occupational History   Occupation: disabled  Tobacco Use   Smoking status: Never   Smokeless tobacco: Never  Vaping Use   Vaping Use: Never used  Substance and Sexual Activity   Alcohol use: Yes    Alcohol/week: 1.0 standard drink of alcohol    Types: 1 Cans of beer per week    Comment: very rare   Drug use: No   Sexual activity: Never  Other Topics Concern   Not on file  Social History Narrative   Lives alone - no children   Social Determinants of Health   Financial Resource Strain: Low Risk  (12/16/2021)   Overall Financial Resource Strain (CARDIA)    Difficulty of Paying Living Expenses: Not hard at all  Food Insecurity: No Food Insecurity (12/16/2021)   Hunger Vital Sign    Worried About Running Out of Food in the Last Year: Never true    Ran Out of Food in the Last Year: Never true  Transportation Needs: No Transportation Needs (12/16/2021)   PRAPARE - Hydrologist (Medical): No    Lack of Transportation (Non-Medical): No  Physical Activity: Sufficiently Active (12/16/2021)   Exercise Vital Sign    Days of Exercise per Week: 7 days    Minutes of Exercise per Session: 30 min  Stress: Stress Concern Present (12/16/2021)   [place]    Feeling of Stress : To some extent  Social Connections: Socially Isolated (12/16/2021)   Social Connection and Isolation Panel [NHANES]     Frequency of Communication with Friends and Family: More than three times a week    Frequency of Social Gatherings with Friends and Family: Once a week    Attends Religious Services: Never    Marine scientist or Organizations: No    Attends Music therapist: Never    Marital Status: Never married    Tobacco Counseling Counseling given: Not Answered   Clinical Intake:  Pre-visit preparation completed: Yes  Pain : No/denies pain     BMI - recorded: 64.98 Nutritional Status: BMI > 30  Obese Nutritional Risks: None Diabetes: Yes CBG done?: No Did pt. bring in CBG monitor from home?: No  How often do you need to have someone help you  when you read instructions, pamphlets, or other written materials from your doctor or pharmacy?: 1 - Never  Diabetic? Nutrition Risk Assessment:  Has the patient had any N/V/D within the last 2 months?  No  Does the patient have any non-healing wounds?  No  Has the patient had any unintentional weight loss or weight gain?  No   Diabetes:  Is the patient diabetic?  Yes  If diabetic, was a CBG obtained today?  No  Did the patient bring in their glucometer from home?  No  How often do you monitor your CBG's? Usually once per day.   Financial Strains and Diabetes Management:  Are you having any financial strains with the device, your supplies or your medication? No .  Does the patient want to be seen by Chronic Care Management for management of their diabetes?  No  Would the patient like to be referred to a Nutritionist or for Diabetic Management?  No   Diabetic Exams:  Diabetic Eye Exam: Overdue for diabetic eye exam. Pt has been advised about the importance in completing this exam. He declines referral at this time.  Diabetic Foot Exam: Completed 08/20/2021. Pt has been advised about the importance in completing this exam.  Interpreter Needed?: No  Information entered by :: Wileen Duncanson, LPN   Activities of Daily  Living    12/16/2021    3:17 PM  In your present state of health, do you have any difficulty performing the following activities:  Hearing? 0  Vision? 0  Difficulty concentrating or making decisions? 0  Walking or climbing stairs? 1  Dressing or bathing? 0  Doing errands, shopping? 0  Preparing Food and eating ? N  Using the Toilet? N  In the past six months, have you accidently leaked urine? N  Do you have problems with loss of bowel control? N  Managing your Medications? N  Managing your Finances? N  Housekeeping or managing your Housekeeping? N    Patient Care Team: Gwenlyn FudgeJoyce, Britney F, FNP as PCP - General (Family Medicine) Randa SpikeForrest, Kelton PillarMichael S, LCSW as Social Worker (Licensed Clinical Social Worker) Conley RollsLe, Westley ChandlerYen Thi Hong, MD as Referring Physician (Optometry)  Indicate any recent Medical Services you may have received from other than [facility] providers in the past year (date may be approximate).     Assessment:   This is a routine wellness examination for Justin FearingJames.  Hearing/Vision screen Hearing Screening - Comments:: Denies hearing difficulties   Vision Screening - Comments:: Denies vision difficulties - doesn't have an eye doctor - has been informed of the importance of annual eye exams for diabetics  Dietary issues and exercise activities discussed: Current Exercise Habits: Home exercise routine, Type of exercise: walking;strength training/weights, Time (Minutes): 30, Frequency (Times/Week): 7, Weekly Exercise (Minutes/Week): 210, Intensity: Moderate, Exercise limited by: orthopedic condition(s)   Goals Addressed             This Visit's Progress    Have 3 meals a day   On track    Eat 3 meals daily that consist of lean proteins, fruits and vegetables Increase water intake        Depression Screen    12/16/2021    3:15 PM 11/19/2021    2:36 PM 08/20/2021    3:41 PM 07/08/2021    2:02 PM 05/22/2021    3:12 PM 03/26/2021    2:44 PM 03/13/2021    4:30 PM  PHQ 2/9 Scores   PHQ - 2 Score 0 0 0 2  0 2 4  PHQ- 9 Score 0 1 0 7 0 6 8    Fall Risk    12/16/2021    3:08 PM 11/19/2021    2:36 PM 08/20/2021    3:41 PM 05/22/2021    3:12 PM 03/26/2021    2:44 PM  Fall Risk   Falls in the past year? 0 0 0 0 0  Number falls in past yr: 0      Injury with Fall? 0      Risk for fall due to : Orthopedic patient;Other (Comment)      Risk for fall due to: Comment morbid obesity      Follow up Falls prevention discussed        FALL RISK PREVENTION PERTAINING TO THE HOME:  Any stairs in or around the home? No  If so, are there any without handrails? No  Home free of loose throw rugs in walkways, pet beds, electrical cords, etc? Yes  Adequate lighting in your home to reduce risk of falls? Yes   ASSISTIVE DEVICES UTILIZED TO PREVENT FALLS:  Life alert? No  Use of a cane, walker or w/c? No  Grab bars in the bathroom? No  Shower chair or bench in shower? No  Elevated toilet seat or a handicapped toilet? No   TIMED UP AND GO:  Was the test performed? No . Telephonic visit  Cognitive Function:    12/01/2017    2:21 PM 11/19/2015    2:40 PM 09/29/2014    4:09 PM  MMSE - Mini Mental State Exam  Orientation to time 5 5 5   Orientation to Place 5 5 5   Registration 3 3 3   Attention/ Calculation 5 5 5   Recall 3 3 3   Language- name 2 objects 2 2 2   Language- repeat 1 1 1   Language- follow 3 step command 3 3 3   Language- read & follow direction 1 1 1   Write a sentence 1 1 1   Copy design 1 1 1   Total score 30 30 30         12/16/2021    3:32 PM  6CIT Screen  What Year? 0 points  What month? 0 points  What time? 0 points  Count back from 20 0 points  Months in reverse 0 points  Repeat phrase 0 points  Total Score 0 points    Immunizations Immunization History  Administered Date(s) Administered   Influenza,inj,Quad PF,6+ Mos 02/01/2015, 02/07/2016, 03/19/2018, 04/20/2019   Td 04/08/2005   Tdap 12/01/2017    TDAP status: Up to date  Flu Vaccine  status: Declined, Education has been provided regarding the importance of this vaccine but patient still declined. Advised may receive this vaccine at local pharmacy or Health Dept. Aware to provide a copy of the vaccination record if obtained from local pharmacy or Health Dept. Verbalized acceptance and understanding.  Pneumococcal vaccines: due at age 11  Covid-19 vaccine status: Declined, Education has been provided regarding the importance of this vaccine but patient still declined. Advised may receive this vaccine at local pharmacy or Health Dept.or vaccine clinic. Aware to provide a copy of the vaccination record if obtained from local pharmacy or Health Dept. Verbalized acceptance and understanding.  Qualifies for Shingles Vaccine? No   Zostavax completed No    Screening Tests Health Maintenance  Topic Date Due   OPHTHALMOLOGY EXAM  Never done   INFLUENZA VACCINE  11/26/2021   COVID-19 Vaccine (1) 02/24/2022 (Originally 02/11/1986)   HEMOGLOBIN A1C  05/22/2022   FOOT EXAM  08/21/2022   TETANUS/TDAP  12/02/2027   Hepatitis C Screening  Completed   HIV Screening  Completed   HPV VACCINES  Aged Out    Health Maintenance  Health Maintenance Due  Topic Date Due   OPHTHALMOLOGY EXAM  Never done   INFLUENZA VACCINE  11/26/2021    Colorectal cancer screening: due at age 72  Lung Cancer Screening: (Low Dose CT Chest recommended if Age 35-80 years, 30 pack-year currently smoking OR have quit w/in 15years.) does not qualify.  Additional Screening:  Hepatitis C Screening: does not qualify  Vision Screening: Recommended annual ophthalmology exams for early detection of glaucoma and other disorders of the eye. Is the patient up to date with their annual eye exam?  No  Who is the provider or what is the name of the office in which the patient attends annual eye exams? none If pt is not established with a provider, would they like to be referred to a provider to establish care? No .    Dental Screening: Recommended annual dental exams for proper oral hygiene  Community Resource Referral / Chronic Care Management: CRR required this visit?  No   CCM required this visit?  No      Plan:     I have personally reviewed and noted the following in the patient's chart:   Medical and social history Use of alcohol, tobacco or illicit drugs  Current medications and supplements including opioid prescriptions. Patient is not currently taking opioid prescriptions. Functional ability and status Nutritional status Physical activity Advanced directives List of other physicians Hospitalizations, surgeries, and ER visits in previous 12 months Vitals Screenings to include cognitive, depression, and falls Referrals and appointments  In addition, I have reviewed and discussed with patient certain preventive protocols, quality metrics, and best practice recommendations. A written personalized care plan for preventive services as well as general preventive health recommendations were provided to patient.     Sandrea Hammond, LPN   [id number]   Nurse Notes: Is now able to walk in stores and push buggy instead of riding a motor cart

## 2021-12-16 NOTE — Patient Instructions (Signed)
Justin Schmidt , Thank you for taking time to come for your Medicare Wellness Visit. I appreciate your ongoing commitment to your health goals. Please review the following plan we discussed and let me know if I can assist you in the future.   Screening recommendations/referrals: Colonoscopy: due at age 41 Recommended yearly ophthalmology/optometry visit for glaucoma screening and checkup Recommended yearly dental visit for hygiene and checkup  Vaccinations:  Influenza vaccine: recommend every Fall Pneumococcal vaccine: recommend once per lifetime after age 64- Prevnar-20 Tdap vaccine: Done 12/01/2017 - recommend every 10 years Shingles vaccine: after age 42 -recommend Shingrix which is 2 doses 2-6 months apart and over 90% effective     Covid-19: recommend 2 doses one month apart with a booster 6 months later   Advanced directives: Advance directive discussed with you today. Even though you declined this today, please call our office should you change your mind, and we can give you the proper paperwork for you to fill out.   Conditions/risks identified: Keep up the great work! Aim for 30 minutes of exercise or brisk walking, 6-8 glasses of water, and 5 servings of fruits and vegetables each day.   Next appointment: Follow up in one year for your annual wellness visit   Preventive Care 40-64 Years, Male Preventive care refers to lifestyle choices and visits with your health care provider that can promote health and wellness. What does preventive care include? A yearly physical exam. This is also called an annual well check. Dental exams once or twice a year. Routine eye exams. Ask your health care provider how often you should have your eyes checked. Personal lifestyle choices, including: Daily care of your teeth and gums. Regular physical activity. Eating a healthy diet. Avoiding tobacco and drug use. Limiting alcohol use. Practicing safe sex. Taking low-dose aspirin every day starting at  age 42. What happens during an annual well check? The services and screenings done by your health care provider during your annual well check will depend on your age, overall health, lifestyle risk factors, and family history of disease. Counseling  Your health care provider may ask you questions about your: Alcohol use. Tobacco use. Drug use. Emotional well-being. Home and relationship well-being. Sexual activity. Eating habits. Work and work Astronomer. Screening  You may have the following tests or measurements: Height, weight, and BMI. Blood pressure. Lipid and cholesterol levels. These may be checked every 5 years, or more frequently if you are over 47 years old. Skin check. Lung cancer screening. You may have this screening every year starting at age 42 if you have a 30-pack-year history of smoking and currently smoke or have quit within the past 15 years. Fecal occult blood test (FOBT) of the stool. You may have this test every year starting at age 73. Flexible sigmoidoscopy or colonoscopy. You may have a sigmoidoscopy every 5 years or a colonoscopy every 10 years starting at age 50. Prostate cancer screening. Recommendations will vary depending on your family history and other risks. Hepatitis C blood test. Hepatitis B blood test. Sexually transmitted disease (STD) testing. Diabetes screening. This is done by checking your blood sugar (glucose) after you have not eaten for a while (fasting). You may have this done every 1-3 years. Discuss your test results, treatment options, and if necessary, the need for more tests with your health care provider. Vaccines  Your health care provider may recommend certain vaccines, such as: Influenza vaccine. This is recommended every year. Tetanus, diphtheria, and acellular pertussis (Tdap,  Td) vaccine. You may need a Td booster every 10 years. Zoster vaccine. You may need this after age 47. Pneumococcal 13-valent conjugate (PCV13) vaccine.  You may need this if you have certain conditions and have not been vaccinated. Pneumococcal polysaccharide (PPSV23) vaccine. You may need one or two doses if you smoke cigarettes or if you have certain conditions. Talk to your health care provider about which screenings and vaccines you need and how often you need them. This information is not intended to replace advice given to you by your health care provider. Make sure you discuss any questions you have with your health care provider. Document Released: 05/11/2015 Document Revised: 01/02/2016 Document Reviewed: 02/13/2015 Elsevier Interactive Patient Education  2017 [employer].  Fall Prevention in the Home Falls can cause injuries. They can happen to people of all ages. There are many things you can do to make your home safe and to help prevent falls. What can I do on the outside of my home? Regularly fix the edges of walkways and driveways and fix any cracks. Remove anything that might make you trip as you walk through a door, such as a raised step or threshold. Trim any bushes or trees on the path to your home. Use bright outdoor lighting. Clear any walking paths of anything that might make someone trip, such as rocks or tools. Regularly check to see if handrails are loose or broken. Make sure that both sides of any steps have handrails. Any raised decks and porches should have guardrails on the edges. Have any leaves, snow, or ice cleared regularly. Use sand or salt on walking paths during winter. Clean up any spills in your garage right away. This includes oil or grease spills. What can I do in the bathroom? Use night lights. Install grab bars by the toilet and in the tub and shower. Do not use towel bars as grab bars. Use non-skid mats or decals in the tub or shower. If you need to sit down in the shower, use a plastic, non-slip stool. Keep the floor dry. Clean up any water that spills on the floor as soon as it happens. Remove  soap buildup in the tub or shower regularly. Attach bath mats securely with double-sided non-slip rug tape. Do not have throw rugs and other things on the floor that can make you trip. What can I do in the bedroom? Use night lights. Make sure that you have a light by your bed that is easy to reach. Do not use any sheets or blankets that are too big for your bed. They should not hang down onto the floor. Have a firm chair that has side arms. You can use this for support while you get dressed. Do not have throw rugs and other things on the floor that can make you trip. What can I do in the kitchen? Clean up any spills right away. Avoid walking on wet floors. Keep items that you use a lot in easy-to-reach places. If you need to reach something above you, use a strong step stool that has a grab bar. Keep electrical cords out of the way. Do not use floor polish or wax that makes floors slippery. If you must use wax, use non-skid floor wax. Do not have throw rugs and other things on the floor that can make you trip. What can I do with my stairs? Do not leave any items on the stairs. Make sure that there are handrails on both sides of the  stairs and use them. Fix handrails that are broken or loose. Make sure that handrails are as long as the stairways. Check any carpeting to make sure that it is firmly attached to the stairs. Fix any carpet that is loose or worn. Avoid having throw rugs at the top or bottom of the stairs. If you do have throw rugs, attach them to the floor with carpet tape. Make sure that you have a light switch at the top of the stairs and the bottom of the stairs. If you do not have them, ask someone to add them for you. What else can I do to help prevent falls? Wear shoes that: Do not have high heels. Have rubber bottoms. Are comfortable and fit you well. Are closed at the toe. Do not wear sandals. If you use a stepladder: Make sure that it is fully opened. Do not climb a  closed stepladder. Make sure that both sides of the stepladder are locked into place. Ask someone to hold it for you, if possible. Clearly mark and make sure that you can see: Any grab bars or handrails. First and last steps. Where the edge of each step is. Use tools that help you move around (mobility aids) if they are needed. These include: Canes. Walkers. Scooters. Crutches. Turn on the lights when you go into a dark area. Replace any light bulbs as soon as they burn out. Set up your furniture so you have a clear path. Avoid moving your furniture around. If any of your floors are uneven, fix them. If there are any pets around you, be aware of where they are. Review your medicines with your doctor. Some medicines can make you feel dizzy. This can increase your chance of falling. Ask your doctor what other things that you can do to help prevent falls. This information is not intended to replace advice given to you by your health care provider. Make sure you discuss any questions you have with your health care provider. Document Released: 02/08/2009 Document Revised: 09/20/2015 Document Reviewed: 05/19/2014 Elsevier Interactive Patient Education  2017 [employer].

## 2022-02-18 ENCOUNTER — Other Ambulatory Visit: Payer: Self-pay | Admitting: Family Medicine

## 2022-02-18 DIAGNOSIS — Z79899 Other long term (current) drug therapy: Secondary | ICD-10-CM

## 2022-02-18 DIAGNOSIS — G8929 Other chronic pain: Secondary | ICD-10-CM

## 2022-02-18 DIAGNOSIS — M545 Low back pain, unspecified: Secondary | ICD-10-CM

## 2022-02-28 ENCOUNTER — Encounter: Payer: Self-pay | Admitting: Nurse Practitioner

## 2022-02-28 ENCOUNTER — Ambulatory Visit (INDEPENDENT_AMBULATORY_CARE_PROVIDER_SITE_OTHER): Payer: Medicare Other | Admitting: Nurse Practitioner

## 2022-02-28 VITALS — BP 126/81 | HR 93 | Temp 97.2°F | Resp 20 | Ht 69.0 in | Wt >= 6400 oz

## 2022-02-28 DIAGNOSIS — E781 Pure hyperglyceridemia: Secondary | ICD-10-CM | POA: Diagnosis not present

## 2022-02-28 DIAGNOSIS — G8929 Other chronic pain: Secondary | ICD-10-CM | POA: Diagnosis not present

## 2022-02-28 DIAGNOSIS — F339 Major depressive disorder, recurrent, unspecified: Secondary | ICD-10-CM

## 2022-02-28 DIAGNOSIS — M545 Low back pain, unspecified: Secondary | ICD-10-CM | POA: Diagnosis not present

## 2022-02-28 DIAGNOSIS — R609 Edema, unspecified: Secondary | ICD-10-CM | POA: Diagnosis not present

## 2022-02-28 DIAGNOSIS — Z6841 Body Mass Index (BMI) 40.0 and over, adult: Secondary | ICD-10-CM

## 2022-02-28 DIAGNOSIS — E1121 Type 2 diabetes mellitus with diabetic nephropathy: Secondary | ICD-10-CM

## 2022-02-28 DIAGNOSIS — Z125 Encounter for screening for malignant neoplasm of prostate: Secondary | ICD-10-CM

## 2022-02-28 DIAGNOSIS — E119 Type 2 diabetes mellitus without complications: Secondary | ICD-10-CM | POA: Diagnosis not present

## 2022-02-28 DIAGNOSIS — R600 Localized edema: Secondary | ICD-10-CM

## 2022-02-28 LAB — BAYER DCA HB A1C WAIVED: HB A1C (BAYER DCA - WAIVED): 7.1 % — ABNORMAL HIGH (ref 4.8–5.6)

## 2022-02-28 MED ORDER — ACETAMINOPHEN-CODEINE 300-30 MG PO TABS: 1.0000 | ORAL_TABLET | Freq: Two times a day (BID) | ORAL | 5 refills | Status: DC

## 2022-02-28 MED ORDER — ROSUVASTATIN CALCIUM 5 MG PO TABS: 5.0000 mg | ORAL_TABLET | Freq: Every day | ORAL | 1 refills | Status: DC

## 2022-02-28 MED ORDER — ESCITALOPRAM OXALATE 10 MG PO TABS: 10.0000 mg | ORAL_TABLET | Freq: Every day | ORAL | 1 refills | Status: DC

## 2022-02-28 MED ORDER — LISINOPRIL 10 MG PO TABS: 10.0000 mg | ORAL_TABLET | Freq: Every day | ORAL | 1 refills | Status: DC

## 2022-02-28 MED ORDER — SITAGLIPTIN PHOSPHATE 50 MG PO TABS: 50.0000 mg | ORAL_TABLET | Freq: Every day | ORAL | 1 refills | Status: DC

## 2022-02-28 MED ORDER — FUROSEMIDE 20 MG PO TABS: 20.0000 mg | ORAL_TABLET | Freq: Every day | ORAL | 1 refills | Status: DC

## 2022-02-28 NOTE — Progress Notes (Signed)
Subjective:    Patient ID: Justin Schmidt, male    DOB: 1980-11-15, 41 y.o.   MRN: [id number]   Chief Complaint: medical management of chronic issues     HPI:  Justin Schmidt is a 41 y.o. who identifies as a male who was assigned male at birth.   Social history: Lives with: by hisself Work history: disability   Comes in today for follow up of the following chronic medical issues:  1. Type 2 diabetes mellitus without complication, without long-term current use of insulin (HCC) Does  not check blood sugar every day. Does not watch diet at all. Lab Results  Component Value Date   HGBA1C 6.9 (H) 11/19/2021      2. Hypertriglyceridemia Does not watch diet and does no dedicated exercise Lab Results  Component Value Date   CHOL 151 11/19/2021   HDL 41 11/19/2021   LDLCALC 82 11/19/2021   TRIG 161 (H) 11/19/2021   CHOLHDL 3.7 11/19/2021   The 10-year ASCVD risk score (Arnett DK, et al., 2019) is: 2.8%    3. Depression, recurrent ([place]) He is currently on lexapro and is doing well    02/28/2022    3:11 PM 12/16/2021    3:15 PM 11/19/2021    2:36 PM  Depression screen PHQ 2/9  Decreased Interest 1 0 0  Down, Depressed, Hopeless 1 0 0  PHQ - 2 Score 2 0 0  Altered sleeping 0 0 0  Tired, decreased energy 0 0 1  Change in appetite 0 0 0  Feeling bad or failure about yourself  1 0 0  Trouble concentrating 0 0 0  Moving slowly or fidgety/restless 0 0 0  Suicidal thoughts 0 0 0  PHQ-9 Score 3 0 1  Difficult doing work/chores Not difficult at all Not difficult at all Not difficult at all     4.  Chronic bilateral low back pain without sciatica Pain assessment: Cause of pain- DDD Pain location- lower back Pain on scale of 1-10- 5/10  Frequency- daily What increases pain-to much activity What makes pain Better-rest helps Effects on ADL - none Any change in general medical condition-none  Current opioids rx- uTylenol #3- doe not takle every day # meds rx-  30 Effectiveness of current meds-helps Adverse reactions from pain meds-none Morphine equivalent- 10 MME  Pill count performed-No Last drug screen - 08/20/21 ( high risk q25m moderate risk [id number] risk yearly ) Urine drug screen today- No Was [place]- yes  If yes were their any concerning findings? - no   Overdose risk: 1    04/20/2019   10:07 PM  Opioid Risk   Alcohol 0  Illegal Drugs 0  Rx Drugs 0  Alcohol 0  Illegal Drugs 0  Rx Drugs 0  Age between 16-45 years  1  History of Preadolescent Sexual Abuse 0  Psychological Disease 0  Depression 1  Opioid Risk Tool Scoring 2  Opioid Risk Interpretation Low Risk     Pain contract signed on:02/28/22- drug screen doe today   5. Peripheral edema Lasix works well to keep swelling under control.   6. Morbid obesity with BMI of 60.0-69.9, adult (HCBetweenWeight is up 12lbs Wt Readings from Last 3 Encounters:  02/28/22 (!) 452 lb (205 kg)  12/16/21 (!) 440 lb (199.6 kg)  11/19/21 (!) 442 lb 9.6 oz (200.8 kg)   BMI Readings from Last 3 Encounters:  02/28/22 66.75 kg/m  12/16/21 64.98 kg/m  11/19/21  65.36 kg/m      New complaints: None today  Allergies  Allergen Reactions   Metformin And Related     diarrhea   Xarelto [Rivaroxaban]     DROWSINESS, FATIGUE   Outpatient Encounter Medications as of 02/28/2022  Medication Sig   silver sulfADIAZINE (SILVADENE) 1 % cream APPLY 1 APPLICATION TOPICALLY DAILY   acetaminophen-codeine (TYLENOL #3) 300-30 MG tablet Take 1 tablet by mouth daily as needed for moderate pain.   Ascorbic Acid (VITAMIN C) 1000 MG tablet Take 1,000 mg by mouth daily.   aspirin EC 81 MG tablet Take 162 mg by mouth daily.    Cholecalciferol (VITAMIN D) 2000 UNITS tablet Take 2,000 Units by mouth daily.   CINNAMON PO Take by mouth.   diclofenac (VOLTAREN) 50 MG EC tablet Take 1 tablet (50 mg total) by mouth 2 (two) times daily.   escitalopram (LEXAPRO) 10 MG tablet Take 1 tablet (10 mg  total) by mouth daily.   fluticasone (FLONASE) 50 MCG/ACT nasal spray SPRAY 2 SPRAYS INTO EACH NOSTRIL EVERY DAY   furosemide (LASIX) 20 MG tablet Take 1 tablet (20 mg total) by mouth daily.   GARLIC PO Take by mouth.   ibuprofen (ADVIL) 800 MG tablet Take one po tid x 10 days   lisinopril (ZESTRIL) 10 MG tablet Take 1 tablet (10 mg total) by mouth daily.   Multiple Vitamin (MULTIVITAMIN WITH MINERALS) TABS Take 1 tablet by mouth daily.   OVER THE COUNTER MEDICATION zinc   OVER THE COUNTER MEDICATION Flaxseed oil   rosuvastatin (CRESTOR) 5 MG tablet TAKE 1 TABLET (5 MG TOTAL) BY MOUTH DAILY.   Facility-Administered Encounter Medications as of 02/28/2022  Medication   sodium chloride irrigation 0.9 %    Past Surgical History:  Procedure Laterality Date   APPENDECTOMY  1991   CHOLECYSTECTOMY  01/23/2012   Procedure: LAPAROSCOPIC CHOLECYSTECTOMY;  Surgeon: Donato Heinz, MD;  Location: [facility];  Service: General;  Laterality: N/A;   TONSILLECTOMY      Family History  Problem Relation Age of Onset   Diabetes Mother    Diabetes Father    Hypertension Father    Hyperlipidemia Father    Migraines Sister    Diabetes Maternal Uncle    Diabetes Maternal Grandmother    Diabetes Other          Review of Systems  Constitutional:  Negative for diaphoresis.  Eyes:  Negative for pain.  Respiratory:  Negative for shortness of breath.   Cardiovascular:  Negative for chest pain, palpitations and leg swelling.  Gastrointestinal:  Negative for abdominal pain.  Endocrine: Negative for polydipsia.  Skin:  Negative for rash.  Neurological:  Negative for dizziness, weakness and headaches.  Hematological:  Does not bruise/bleed easily.  All other systems reviewed and are negative.      Objective:   Physical Exam Vitals and nursing note reviewed.  Constitutional:      Appearance: Normal appearance. He is well-developed.  HENT:     Head: Normocephalic.     Nose: Nose normal.      Mouth/Throat:     Mouth: Mucous membranes are moist.     Pharynx: Oropharynx is clear.  Eyes:     Pupils: Pupils are equal, round, and reactive to light.  Neck:     Thyroid: No thyroid mass or thyromegaly.     Vascular: No carotid bruit or JVD.     Trachea: Phonation normal.  Cardiovascular:     Rate and Rhythm:  Normal rate and regular rhythm.  Pulmonary:     Effort: Pulmonary effort is normal. No respiratory distress.     Breath sounds: Normal breath sounds.  Abdominal:     General: Bowel sounds are normal.     Palpations: Abdomen is soft.     Tenderness: There is no abdominal tenderness.  Musculoskeletal:        General: Normal range of motion.     Cervical back: Normal range of motion and neck supple.  Lymphadenopathy:     Cervical: No cervical adenopathy.  Skin:    General: Skin is warm and dry.  Neurological:     Mental Status: He is alert and oriented to person, place, and time.  Psychiatric:        Behavior: Behavior normal.        Thought Content: Thought content normal.        Judgment: Judgment normal.   BP 126/81   Pulse 93   Temp (!) 97.2 F (36.2 C) (Temporal)   Resp 20   Ht _0  (1.753 m)   Wt (!) 452 lb (205 kg)   SpO2 98%   BMI 66.75 kg/m   HGBA1c 7.1%       Assessment & Plan:   YUVAN MEDINGER comes in today with chief complaint of No chief complaint on file.   Diagnosis and orders addressed:  1. Type 2 diabetes mellitus without complication, without long-term current use of insulin (HCC) Low carb diet - Bayer DCA Hb A1c Waived - CBC with Differential/Platelet - CMP14+EGFR Januvia 84m 1 daily added  2. Hypertriglyceridemia Low fat diet - Lipid panel - rosuvastatin (CRESTOR) 5 MG tablet; Take 1 tablet (5 mg total) by mouth daily.  Dispense: 90 tablet; Refill: 1  3. Depression, recurrent (HCC) Stress manaegment - escitalopram (LEXAPRO) 10 MG tablet; Take 1 tablet (10 mg total) by mouth daily.  Dispense: 90 tablet; Refill: 1  4.  Chronic bilateral low back pain without sciatica Moist heat  Rest Refill tylenol #3 - ToxASSURE Select 13 (MW), Urine  5. Peripheral edema Elevate legs when sitting  6. Morbid obesity with BMI of 60.0-69.9, adult ([place] Discussed diet and exercise for person with BMI >25 Will recheck weight in 3-6 months   7. Screening for prostate cancer Labs pending - PSA, total and free  8. Diabetic nephropathy associated with type 2 diabetes mellitus ([place] Do noy go barefooted - lisinopril (ZESTRIL) 10 MG tablet; Take 1 tablet (10 mg total) by mouth daily.  Dispense: 90 tablet; Refill: 1  9. Swelling  - furosemide (LASIX) 20 MG tablet; Take 1 tablet (20 mg total) by mouth daily.  Dispense: 90 tablet; Refill: 1   Labs pending Health Maintenance reviewed Diet and exercise encouraged  Follow up plan: 3 month   MOphir FNP

## 2022-02-28 NOTE — Addendum Note (Signed)
Addended by: Chevis Pretty on: 02/28/2022 03:40 PM   Modules accepted: Orders

## 2022-03-01 LAB — CBC WITH DIFFERENTIAL/PLATELET
Basophils Absolute: 0.1 10*3/uL (ref 0.0–0.2)
Basos: 1 %
EOS (ABSOLUTE): 0.1 10*3/uL (ref 0.0–0.4)
Eos: 2 %
Hematocrit: 44.9 % (ref 37.5–51.0)
Hemoglobin: 15 g/dL (ref 13.0–17.7)
Immature Grans (Abs): 0 10*3/uL (ref 0.0–0.1)
Immature Granulocytes: 0 %
Lymphocytes Absolute: 1.5 10*3/uL (ref 0.7–3.1)
Lymphs: 25 %
MCH: 31.1 pg (ref 26.6–33.0)
MCHC: 33.4 g/dL (ref 31.5–35.7)
MCV: 93 fL (ref 79–97)
Monocytes Absolute: 0.5 10*3/uL (ref 0.1–0.9)
Monocytes: 9 %
Neutrophils Absolute: 3.8 10*3/uL (ref 1.4–7.0)
Neutrophils: 63 %
Platelets: 267 10*3/uL (ref 150–450)
RBC: 4.82 x10E6/uL (ref 4.14–5.80)
RDW: 12.9 % (ref 11.6–15.4)
WBC: 6.1 10*3/uL (ref 3.4–10.8)

## 2022-03-01 LAB — CMP14+EGFR
ALT: 49 IU/L — ABNORMAL HIGH (ref 0–44)
AST: 33 IU/L (ref 0–40)
Albumin/Globulin Ratio: 1.6 (ref 1.2–2.2)
Albumin: 4.4 g/dL (ref 4.1–5.1)
Alkaline Phosphatase: 65 IU/L (ref 44–121)
BUN/Creatinine Ratio: 12 (ref 9–20)
BUN: 14 mg/dL (ref 6–24)
Bilirubin Total: 0.2 mg/dL (ref 0.0–1.2)
CO2: 27 mmol/L (ref 20–29)
Calcium: 9.3 mg/dL (ref 8.7–10.2)
Chloride: 102 mmol/L (ref 96–106)
Creatinine, Ser: 1.15 mg/dL (ref 0.76–1.27)
Globulin, Total: 2.7 g/dL (ref 1.5–4.5)
Glucose: 105 mg/dL — ABNORMAL HIGH (ref 70–99)
Potassium: 4.9 mmol/L (ref 3.5–5.2)
Sodium: 140 mmol/L (ref 134–144)
Total Protein: 7.1 g/dL (ref 6.0–8.5)
eGFR: 81 mL/min/{1.73_m2} (ref >59)

## 2022-03-01 LAB — LIPID PANEL
Chol/HDL Ratio: 3.2 ratio (ref 0.0–5.0)
Cholesterol, Total: 148 mg/dL (ref 100–199)
HDL: 46 mg/dL (ref >39)
LDL Chol Calc (NIH): 84 mg/dL (ref 0–99)
Triglycerides: 96 mg/dL (ref 0–149)
VLDL Cholesterol Cal: 18 mg/dL (ref 5–40)

## 2022-03-01 LAB — PSA, TOTAL AND FREE
PSA, Free Pct: 43.3 %
PSA, Free: 0.13 ng/mL
Prostate Specific Ag, Serum: 0.3 ng/mL (ref 0.0–4.0)

## 2022-03-05 LAB — TOXASSURE SELECT 13 (MW), URINE

## 2022-06-02 ENCOUNTER — Ambulatory Visit: Payer: Medicare Other | Admitting: Nurse Practitioner

## 2022-06-03 ENCOUNTER — Ambulatory Visit (INDEPENDENT_AMBULATORY_CARE_PROVIDER_SITE_OTHER): Payer: 59 | Admitting: Nurse Practitioner

## 2022-06-03 ENCOUNTER — Encounter: Payer: Self-pay | Admitting: Nurse Practitioner

## 2022-06-03 VITALS — BP 130/67 | HR 96 | Temp 97.9°F | Ht 69.0 in | Wt >= 6400 oz

## 2022-06-03 DIAGNOSIS — L84 Corns and callosities: Secondary | ICD-10-CM | POA: Diagnosis not present

## 2022-06-03 DIAGNOSIS — K760 Fatty (change of) liver, not elsewhere classified: Secondary | ICD-10-CM | POA: Diagnosis not present

## 2022-06-03 DIAGNOSIS — E782 Mixed hyperlipidemia: Secondary | ICD-10-CM

## 2022-06-03 DIAGNOSIS — E119 Type 2 diabetes mellitus without complications: Secondary | ICD-10-CM

## 2022-06-03 DIAGNOSIS — G8929 Other chronic pain: Secondary | ICD-10-CM | POA: Diagnosis not present

## 2022-06-03 DIAGNOSIS — M545 Low back pain, unspecified: Secondary | ICD-10-CM

## 2022-06-03 DIAGNOSIS — Z6841 Body Mass Index (BMI) 40.0 and over, adult: Secondary | ICD-10-CM

## 2022-06-03 DIAGNOSIS — F339 Major depressive disorder, recurrent, unspecified: Secondary | ICD-10-CM

## 2022-06-03 LAB — BAYER DCA HB A1C WAIVED: HB A1C (BAYER DCA - WAIVED): 7.1 % — ABNORMAL HIGH (ref 4.8–5.6)

## 2022-06-03 NOTE — Patient Instructions (Signed)
Corns and Calluses Corns are small areas of thickened skin that form on the top, sides, or tip of a toe. Corns have a cone-shaped core with a point that can press on a nerve below. This causes pain. Calluses are areas of thickened skin that can form anywhere on the body, including the hands, fingers, palms, soles of the feet, and heels. Calluses are usually larger than corns. What are the causes? Corns and calluses are caused by rubbing (friction) or pressure, such as from shoes that are too tight or do not fit properly. What increases the risk? Corns are more likely to develop in people who have misshapen toes (toe deformities), such as hammer toes. Calluses can form with friction to any area of the skin. They are more likely to develop in people who: Work with their hands. Wear shoes that fit poorly, are too tight, or are high-heeled. Have toe deformities. What are the signs or symptoms? Symptoms of a corn or callus include: A hard growth on the skin. Pain or tenderness under the skin. Redness and swelling. Increased discomfort while wearing tight-fitting shoes, if your feet are affected. If a corn or callus becomes infected, symptoms may include: Redness and swelling that gets worse. Pain. Fluid, blood, or pus draining from the corn or callus. How is this diagnosed? Corns and calluses may be diagnosed based on your symptoms, your medical history, and a physical exam. How is this treated? Treatment for corns and calluses may include: Removing the cause of the friction or pressure. This may involve: Changing your shoes. Wearing shoe inserts (orthotics) or other protective layers in your shoes, such as a corn pad. Wearing gloves. Applying medicine to the skin (topical medicine) to help soften skin in the hardened, thickened areas. Removing layers of dead skin with a file to reduce the size of the corn or callus. Removing the corn or callus with a scalpel or laser. Taking antibiotic  medicines, if your corn or callus is infected. Having surgery, if a toe deformity is the cause. Follow these instructions at home:  Take over-the-counter and prescription medicines only as told by your health care provider. If you were prescribed an antibiotic medicine, take it as told by your health care provider. Do not stop taking it even if your condition improves. Wear shoes that fit well. Avoid wearing high-heeled shoes and shoes that are too tight or too loose. Wear any padding, protective layers, gloves, or orthotics as told by your health care provider. Soak your hands or feet. Then use a file or pumice stone to soften your corn or callus. Do this as told by your health care provider. Check your corn or callus every day for signs of infection. Contact a health care provider if: Your symptoms do not improve with treatment. You have redness or swelling that gets worse. Your corn or callus becomes painful. You have fluid, blood, or pus coming from your corn or callus. You have new symptoms. Get help right away if: You develop severe pain with redness. Summary Corns are small areas of thickened skin that form on the top, sides, or tip of a toe. These can be painful. Calluses are areas of thickened skin that can form anywhere on the body, including the hands, fingers, palms, and soles of the feet. Calluses are usually larger than corns. Corns and calluses are caused by rubbing (friction) or pressure, such as from shoes that are too tight or do not fit properly. Treatment may include wearing padding, protective   layers, gloves, or orthotics as told by your health care provider. This information is not intended to replace advice given to you by your health care provider. Make sure you discuss any questions you have with your health care provider. Document Revised: 08/11/2019 Document Reviewed: 08/11/2019 Elsevier Patient Education  2023 [employer] Inc.  

## 2022-06-03 NOTE — Progress Notes (Signed)
Subjective:    Patient ID: Justin Schmidt, male    DOB: 08-Feb-1981, 42 y.o.   MRN: [id number]   Chief Complaint: medical management of chronic issues     HPI:  Justin Schmidt is a 42 y.o. who identifies as a male who was assigned male at birth.   Social history: Lives with: by himself Work history: disabliity   Comes in today for follow up of the following chronic medical issues:  1. Type 2 diabetes mellitus without complication, without long-term current use of insulin (HCC) Fatsing blood sugars are running around 140-170.  Denies any  low blood sugars Lab Results  Component Value Date   HGBA1C 7.1 (H) 02/28/2022     2. Depression, recurrent ([place]) Is on lexapro and is doing well.  3. Mixed hyperlipidemia Does not watch diet and does no dedicated exercise Lab Results  Component Value Date   CHOL 148 02/28/2022   HDL 46 02/28/2022   LDLCALC 84 02/28/2022   TRIG 96 02/28/2022   CHOLHDL 3.2 02/28/2022     4. Chronic bilateral low back pain without sciatica Is on tylenol #3 bid- helps with his daily pain  5. Fatty liver Lab Results  Component Value Date   ALT 49 (H) 02/28/2022   AST 33 02/28/2022   ALKPHOS 65 02/28/2022   BILITOT 0.2 02/28/2022   6. Morbid obesity Weight is up 12 lbs Wt Readings from Last 3 Encounters:  02/28/22 (!) 452 lb (205 kg)  12/16/21 (!) 440 lb (199.6 kg)  11/19/21 (!) 442 lb 9.6 oz (200.8 kg)   BMI Readings from Last 3 Encounters:  02/28/22 66.75 kg/m  12/16/21 64.98 kg/m  11/19/21 65.36 kg/m     New complaints: None today  Allergies  Allergen Reactions   Metformin And Related     diarrhea   Xarelto [Rivaroxaban]     DROWSINESS, FATIGUE   Outpatient Encounter Medications as of 06/03/2022  Medication Sig   acetaminophen-codeine (TYLENOL #3) 300-30 MG tablet Take 1 tablet by mouth in the morning and at bedtime.   Ascorbic Acid (VITAMIN C) 1000 MG tablet Take 1,000 mg by mouth daily.   aspirin EC 81 MG tablet Take 162  mg by mouth daily.    Cholecalciferol (VITAMIN D) 2000 UNITS tablet Take 2,000 Units by mouth daily.   CINNAMON PO Take by mouth.   diclofenac (VOLTAREN) 50 MG EC tablet Take 1 tablet (50 mg total) by mouth 2 (two) times daily.   escitalopram (LEXAPRO) 10 MG tablet Take 1 tablet (10 mg total) by mouth daily.   fluticasone (FLONASE) 50 MCG/ACT nasal spray SPRAY 2 SPRAYS INTO EACH NOSTRIL EVERY DAY   furosemide (LASIX) 20 MG tablet Take 1 tablet (20 mg total) by mouth daily.   GARLIC PO Take by mouth.   ibuprofen (ADVIL) 800 MG tablet Take one po tid x 10 days   lisinopril (ZESTRIL) 10 MG tablet Take 1 tablet (10 mg total) by mouth daily.   Multiple Vitamin (MULTIVITAMIN WITH MINERALS) TABS Take 1 tablet by mouth daily.   OVER THE COUNTER MEDICATION zinc   OVER THE COUNTER MEDICATION Flaxseed oil   rosuvastatin (CRESTOR) 5 MG tablet Take 1 tablet (5 mg total) by mouth daily.   silver sulfADIAZINE (SILVADENE) 1 % cream APPLY 1 APPLICATION TOPICALLY DAILY   sitaGLIPtin (JANUVIA) 50 MG tablet Take 1 tablet (50 mg total) by mouth daily.   Facility-Administered Encounter Medications as of 06/03/2022  Medication   sodium chloride irrigation  0.9 %    Past Surgical History:  Procedure Laterality Date   APPENDECTOMY  1991   CHOLECYSTECTOMY  01/23/2012   Procedure: LAPAROSCOPIC CHOLECYSTECTOMY;  Surgeon: Donato Heinz, MD;  Location: [facility];  Service: General;  Laterality: N/A;   TONSILLECTOMY      Family History  Problem Relation Age of Onset   Diabetes Mother    Diabetes Father    Hypertension Father    Hyperlipidemia Father    Migraines Sister    Diabetes Maternal Uncle    Diabetes Maternal Grandmother    Diabetes Other       Controlled substance contract: n/a     Review of Systems     Objective:   Physical Exam Constitutional:      Appearance: Normal appearance.  Cardiovascular:     Rate and Rhythm: Normal rate and regular rhythm.     Heart sounds: Normal heart  sounds.  Pulmonary:     Breath sounds: Normal breath sounds.  Abdominal:     Hernia: A hernia (umbilical hernia) is present.  Skin:    General: Skin is warm and dry.     Comments: Darken skin bil lower ext  Neurological:     General: No focal deficit present.     Mental Status: He is alert and oriented to person, place, and time.  Psychiatric:        Mood and Affect: Mood normal.        Behavior: Behavior normal.      BP 130/67   Pulse 96   Temp 97.9 F (36.6 C)   Ht 5\' 9"  (1.753 m)   Wt (!) 462 lb (209.6 kg)   SpO2 94%   BMI 68.23 kg/m  Hgba1c 7.1%     Assessment & Plan:   Justin Schmidt in today with chief complaint of Medical Management of Chronic Issues   1. Type 2 diabetes mellitus without complication, without long-term current use of insulin (HCC) Continue to wtatch carbs in diet - Bayer DCA Hb A1c Waived - CBC with Differential - CMP14+EGFR - Lipid Panel  2. Depression, recurrent ([place]) Stress management  3. Mixed hyperlipidemia Low fat diet  4. Chronic bilateral low back pain without sciatica Continue tylenol #3  5. Fatty liver  6. Morbid obesity with BMI of 60.0-69.9, adult ([place]) Discussed diet and exercise for person with BMI >25 Will recheck weight in 3-6 months   7. Callus of foot - Ambulatory referral to Podiatry    The above assessment and management plan was discussed with the patient. The patient verbalized understanding of and has agreed to the management plan. Patient is aware to call the clinic if symptoms persist or worsen. Patient is aware when to return to the clinic for a follow-up visit. Patient educated on when it is appropriate to go to the emergency department.   [name] Hassell Done, FNP

## 2022-06-04 LAB — LIPID PANEL
Chol/HDL Ratio: 4.2 ratio (ref 0.0–5.0)
Cholesterol, Total: 167 mg/dL (ref 100–199)
HDL: 40 mg/dL (ref >39)
LDL Chol Calc (NIH): 92 mg/dL (ref 0–99)
Triglycerides: 207 mg/dL — ABNORMAL HIGH (ref 0–149)
VLDL Cholesterol Cal: 35 mg/dL (ref 5–40)

## 2022-06-04 LAB — CBC WITH DIFFERENTIAL/PLATELET
Basophils Absolute: 0.1 10*3/uL (ref 0.0–0.2)
Basos: 1 %
EOS (ABSOLUTE): 0.1 10*3/uL (ref 0.0–0.4)
Eos: 1 %
Hematocrit: 42.2 % (ref 37.5–51.0)
Hemoglobin: 14.1 g/dL (ref 13.0–17.7)
Immature Grans (Abs): 0 10*3/uL (ref 0.0–0.1)
Immature Granulocytes: 0 %
Lymphocytes Absolute: 1.2 10*3/uL (ref 0.7–3.1)
Lymphs: 17 %
MCH: 30.7 pg (ref 26.6–33.0)
MCHC: 33.4 g/dL (ref 31.5–35.7)
MCV: 92 fL (ref 79–97)
Monocytes Absolute: 0.6 10*3/uL (ref 0.1–0.9)
Monocytes: 9 %
Neutrophils Absolute: 5.1 10*3/uL (ref 1.4–7.0)
Neutrophils: 72 %
Platelets: 256 10*3/uL (ref 150–450)
RBC: 4.59 x10E6/uL (ref 4.14–5.80)
RDW: 13.1 % (ref 11.6–15.4)
WBC: 7.1 10*3/uL (ref 3.4–10.8)

## 2022-06-04 LAB — CMP14+EGFR
ALT: 46 IU/L — ABNORMAL HIGH (ref 0–44)
AST: 24 IU/L (ref 0–40)
Albumin/Globulin Ratio: 1.7 (ref 1.2–2.2)
Albumin: 4.3 g/dL (ref 4.1–5.1)
Alkaline Phosphatase: 60 IU/L (ref 44–121)
BUN/Creatinine Ratio: 18 (ref 9–20)
BUN: 20 mg/dL (ref 6–24)
Bilirubin Total: 0.2 mg/dL (ref 0.0–1.2)
CO2: 22 mmol/L (ref 20–29)
Calcium: 9.4 mg/dL (ref 8.7–10.2)
Chloride: 102 mmol/L (ref 96–106)
Creatinine, Ser: 1.13 mg/dL (ref 0.76–1.27)
Globulin, Total: 2.5 g/dL (ref 1.5–4.5)
Glucose: 139 mg/dL — ABNORMAL HIGH (ref 70–99)
Potassium: 4.6 mmol/L (ref 3.5–5.2)
Sodium: 140 mmol/L (ref 134–144)
Total Protein: 6.8 g/dL (ref 6.0–8.5)
eGFR: 83 mL/min/{1.73_m2} (ref >59)

## 2022-06-05 ENCOUNTER — Other Ambulatory Visit: Payer: Self-pay | Admitting: Family Medicine

## 2022-06-05 DIAGNOSIS — G8929 Other chronic pain: Secondary | ICD-10-CM

## 2022-06-05 DIAGNOSIS — M545 Low back pain, unspecified: Secondary | ICD-10-CM

## 2022-06-19 DIAGNOSIS — M79672 Pain in left foot: Secondary | ICD-10-CM | POA: Diagnosis not present

## 2022-06-19 DIAGNOSIS — B070 Plantar wart: Secondary | ICD-10-CM | POA: Diagnosis not present

## 2022-07-17 DIAGNOSIS — B070 Plantar wart: Secondary | ICD-10-CM | POA: Diagnosis not present

## 2022-07-17 DIAGNOSIS — M79672 Pain in left foot: Secondary | ICD-10-CM | POA: Diagnosis not present

## 2022-08-22 ENCOUNTER — Other Ambulatory Visit: Payer: Self-pay | Admitting: Nurse Practitioner

## 2022-08-29 ENCOUNTER — Ambulatory Visit (INDEPENDENT_AMBULATORY_CARE_PROVIDER_SITE_OTHER): Payer: 59 | Admitting: Nurse Practitioner

## 2022-08-29 ENCOUNTER — Encounter: Payer: Self-pay | Admitting: Nurse Practitioner

## 2022-08-29 VITALS — BP 126/79 | HR 96 | Temp 97.4°F | Resp 20 | Ht 69.0 in | Wt >= 6400 oz

## 2022-08-29 DIAGNOSIS — G8929 Other chronic pain: Secondary | ICD-10-CM | POA: Diagnosis not present

## 2022-08-29 DIAGNOSIS — E785 Hyperlipidemia, unspecified: Secondary | ICD-10-CM | POA: Diagnosis not present

## 2022-08-29 DIAGNOSIS — E781 Pure hyperglyceridemia: Secondary | ICD-10-CM

## 2022-08-29 DIAGNOSIS — F339 Major depressive disorder, recurrent, unspecified: Secondary | ICD-10-CM | POA: Diagnosis not present

## 2022-08-29 DIAGNOSIS — M545 Low back pain, unspecified: Secondary | ICD-10-CM

## 2022-08-29 DIAGNOSIS — E1121 Type 2 diabetes mellitus with diabetic nephropathy: Secondary | ICD-10-CM | POA: Diagnosis not present

## 2022-08-29 DIAGNOSIS — Z6841 Body Mass Index (BMI) 40.0 and over, adult: Secondary | ICD-10-CM

## 2022-08-29 DIAGNOSIS — R609 Edema, unspecified: Secondary | ICD-10-CM | POA: Diagnosis not present

## 2022-08-29 DIAGNOSIS — E1169 Type 2 diabetes mellitus with other specified complication: Secondary | ICD-10-CM | POA: Diagnosis not present

## 2022-08-29 DIAGNOSIS — E119 Type 2 diabetes mellitus without complications: Secondary | ICD-10-CM

## 2022-08-29 LAB — BAYER DCA HB A1C WAIVED: HB A1C (BAYER DCA - WAIVED): 7.4 % — ABNORMAL HIGH (ref 4.8–5.6)

## 2022-08-29 MED ORDER — ROSUVASTATIN CALCIUM 5 MG PO TABS
5.0000 mg | ORAL_TABLET | Freq: Every day | ORAL | 1 refills | Status: DC
Start: 2022-08-29 — End: 2022-12-22

## 2022-08-29 MED ORDER — SITAGLIPTIN PHOSPHATE 50 MG PO TABS
50.0000 mg | ORAL_TABLET | Freq: Every day | ORAL | 1 refills | Status: DC
Start: 2022-08-29 — End: 2022-12-22

## 2022-08-29 MED ORDER — ESCITALOPRAM OXALATE 10 MG PO TABS
10.0000 mg | ORAL_TABLET | Freq: Every day | ORAL | 1 refills | Status: DC
Start: 2022-08-29 — End: 2022-12-22

## 2022-08-29 MED ORDER — LISINOPRIL 10 MG PO TABS
10.0000 mg | ORAL_TABLET | Freq: Every day | ORAL | 1 refills | Status: DC
Start: 2022-08-29 — End: 2022-12-22

## 2022-08-29 MED ORDER — ACETAMINOPHEN-CODEINE 300-30 MG PO TABS
1.0000 | ORAL_TABLET | Freq: Two times a day (BID) | ORAL | 5 refills | Status: DC
Start: 2022-08-29 — End: 2022-12-22

## 2022-08-29 MED ORDER — FUROSEMIDE 20 MG PO TABS: 20.0000 mg | ORAL_TABLET | Freq: Every day | ORAL | 1 refills | Status: DC

## 2022-08-29 NOTE — Progress Notes (Signed)
Subjective:    Patient ID: Justin Schmidt, male    DOB: 1981/03/31, 42 y.o.   MRN: [id number]   Chief Complaint: medical management of chronic issues     HPI:  Justin Schmidt is a 42 y.o. who identifies as a male who was assigned male at birth.   Social history: Lives with: by himself Work history: disability   Comes in today for follow up of the following chronic medical issues:  1. Type 2 diabetes mellitus without complication, without long-term current use of insulin (HCC) Fasting blood sugars are running around 110-150. Deneis any low blood sugars.  Lab Results  Component Value Date   HGBA1C 7.1 (H) 06/03/2022     2. Hyperlipidemia associated with type 2 diabetes mellitus (HCC) He does not watch diet very closely . Says he exercises several times a week. Lab Results  Component Value Date   CHOL 167 06/03/2022   HDL 40 06/03/2022   LDLCALC 92 06/03/2022   TRIG 207 (H) 06/03/2022   CHOLHDL 4.2 06/03/2022   The 10-year ASCVD risk score (Arnett DK, et al., 2019) is: 3%   3. Depression, recurrent (HCC) Is currently on lexapro and is doing well    08/29/2022    2:22 PM 06/03/2022    3:43 PM 06/03/2022    3:19 PM  Depression screen PHQ 2/9  Decreased Interest 0 0 1  Down, Depressed, Hopeless 1 0 1  PHQ - 2 Score 1 0 2  Altered sleeping 0 0 0  Tired, decreased energy 0 0 0  Change in appetite 0 0 0  Feeling bad or failure about yourself  1 0 0  Trouble concentrating 0 0 0  Moving slowly or fidgety/restless 0 0 0  Suicidal thoughts 1 0 0  PHQ-9 Score 3 0 2  Difficult doing work/chores Somewhat difficult Not difficult at all Not difficult at all     4. Morbid obesity with BMI of 60.0-69.9, adult (HCC) Weight is down 18lbs Wt Readings from Last 3 Encounters:  08/29/22 (!) 445 lb (201.9 kg)  06/03/22 (!) 462 lb (209.6 kg)  02/28/22 (!) 452 lb (205 kg)   BMI Readings from Last 3 Encounters:  08/29/22 65.72 kg/m  06/03/22 68.23 kg/m  02/28/22 66.75 kg/m      5. Chronic bilateral low back pain without sciatica Pain assessment: Cause of pain- says DDD Pain location- low back Pain on scale of 1-10- 5/10 Frequency- daily What increases pain-nothing really What makes pain Better-pain meds help some Effects on ADL - none Any change in general medical condition-none  Current opioids rx- tylenol #3 # meds rx- 59 Effectiveness of current meds-helps Adverse reactions from pain meds-none Morphine equivalent-  Pill count performed-No Last drug screen - 02/28/22 ( high risk q20m, moderate risk q28m, low risk yearly ) Urine drug screen today- No Was the [facility] reviewed- yes  If yes were their any concerning findings? - no   Overdose risk: 1    04/20/2019   10:07 PM  Opioid Risk   Alcohol 0  Illegal Drugs 0  Rx Drugs 0  Alcohol 0  Illegal Drugs 0  Rx Drugs 0  Age between 16-45 years  1  History of Preadolescent Sexual Abuse 0  Psychological Disease 0  Depression 1  Opioid Risk Tool Scoring 2  Opioid Risk Interpretation Low Risk     Pain contract signed on:02/28/22    New complaints: None today  Allergies  Allergen Reactions  Metformin And Related     diarrhea   Xarelto [Rivaroxaban]     DROWSINESS, FATIGUE   Outpatient Encounter Medications as of 08/29/2022  Medication Sig   acetaminophen-codeine (TYLENOL #3) 300-30 MG tablet Take 1 tablet by mouth in the morning and at bedtime.   Ascorbic Acid (VITAMIN C) 1000 MG tablet Take 1,000 mg by mouth daily.   aspirin EC 81 MG tablet Take 162 mg by mouth daily.    Cholecalciferol (VITAMIN D) 2000 UNITS tablet Take 2,000 Units by mouth daily.   CINNAMON PO Take by mouth.   diclofenac (VOLTAREN) 50 MG EC tablet TAKE 1 TABLET BY MOUTH TWICE A DAY   escitalopram (LEXAPRO) 10 MG tablet Take 1 tablet (10 mg total) by mouth daily.   fluticasone (FLONASE) 50 MCG/ACT nasal spray SPRAY 2 SPRAYS INTO EACH NOSTRIL EVERY DAY   furosemide (LASIX) 20 MG tablet Take 1 tablet (20 mg  total) by mouth daily.   GARLIC PO Take by mouth.   ibuprofen (ADVIL) 800 MG tablet Take one po tid x 10 days   lisinopril (ZESTRIL) 10 MG tablet Take 1 tablet (10 mg total) by mouth daily.   Multiple Vitamin (MULTIVITAMIN WITH MINERALS) TABS Take 1 tablet by mouth daily.   OVER THE COUNTER MEDICATION zinc   OVER THE COUNTER MEDICATION Flaxseed oil   rosuvastatin (CRESTOR) 5 MG tablet Take 1 tablet (5 mg total) by mouth daily.   silver sulfADIAZINE (SILVADENE) 1 % cream APPLY 1 APPLICATION TOPICALLY DAILY   sitaGLIPtin (JANUVIA) 50 MG tablet TAKE 1 TABLET BY MOUTH EVERY DAY   Facility-Administered Encounter Medications as of 08/29/2022  Medication   sodium chloride irrigation 0.9 %    Past Surgical History:  Procedure Laterality Date   APPENDECTOMY  1991   CHOLECYSTECTOMY  01/23/2012   Procedure: LAPAROSCOPIC CHOLECYSTECTOMY;  Surgeon: Fabio Bering, MD;  Location: [facility];  Service: General;  Laterality: N/A;   TONSILLECTOMY      Family History  Problem Relation Age of Onset   Diabetes Mother    Diabetes Father    Hypertension Father    Hyperlipidemia Father    Migraines Sister    Diabetes Maternal Uncle    Diabetes Maternal Grandmother    Diabetes Other        Review of Systems  Constitutional:  Negative for diaphoresis.  Eyes:  Negative for pain.  Respiratory:  Negative for shortness of breath.   Cardiovascular:  Negative for chest pain, palpitations and leg swelling.  Gastrointestinal:  Negative for abdominal pain.  Endocrine: Negative for polydipsia.  Skin:  Negative for rash.  Neurological:  Negative for dizziness, weakness and headaches.  Hematological:  Does not bruise/bleed easily.  All other systems reviewed and are negative.      Objective:   Physical Exam Vitals and nursing note reviewed.  Constitutional:      Appearance: Normal appearance. He is well-developed.  HENT:     Head: Normocephalic.     Nose: Nose normal.     Mouth/Throat:     Mouth:  Mucous membranes are moist.     Pharynx: Oropharynx is clear.  Eyes:     Pupils: Pupils are equal, round, and reactive to light.  Neck:     Thyroid: No thyroid mass or thyromegaly.     Vascular: No carotid bruit or JVD.     Trachea: Phonation normal.  Cardiovascular:     Rate and Rhythm: Normal rate and regular rhythm.  Pulmonary:  Effort: Pulmonary effort is normal. No respiratory distress.     Breath sounds: Normal breath sounds.  Abdominal:     General: Bowel sounds are normal.     Palpations: Abdomen is soft.     Tenderness: There is no abdominal tenderness.  Musculoskeletal:        General: Normal range of motion.     Cervical back: Normal range of motion and neck supple.  Lymphadenopathy:     Cervical: No cervical adenopathy.  Skin:    General: Skin is warm and dry.  Neurological:     Mental Status: He is alert and oriented to person, place, and time.  Psychiatric:        Behavior: Behavior normal.        Thought Content: Thought content normal.        Judgment: Judgment normal.    BP 126/79   Pulse 96   Temp (!) 97.4 F (36.3 C) (Temporal)   Resp 20   Ht 5\' 9"  (1.753 m)   Wt (!) 445 lb (201.9 kg)   SpO2 94%   BMI 65.72 kg/m   Hgba1c 7.4%       Assessment & Plan:  Justin Schmidt comes in today with chief complaint of Medical Management of Chronic Issues   Diagnosis and orders addressed:  1. Type 2 diabetes mellitus without complication, without long-term current use of insulin (HCC) Stricter csrb counting - Bayer DCA Hb A1c Waived - Microalbumin / creatinine urine ratio - rosuvastatin (CRESTOR) 5 MG tablet; Take 1 tablet (5 mg total) by mouth daily.  Dispense: 90 tablet; Refill: 1 - sitaGLIPtin (JANUVIA) 50 MG tablet; Take 1 tablet (50 mg total) by mouth daily.  Dispense: 90 tablet; Refill: 1  2. Hyperlipidemia associated with type 2 diabetes mellitus (HCC) Low fat diet - Lipid panel  3. Depression, recurrent (HCC) Stress management - CBC with  Differential/Platelet - CMP14+EGFR - escitalopram (LEXAPRO) 10 MG tablet; Take 1 tablet (10 mg total) by mouth daily.  Dispense: 90 tablet; Refill: 1  4. Morbid obesity with BMI of 60.0-69.9, adult (HCC) Discussed diet and exercise for person with BMI >25 Will recheck weight in 3-6 months  5. Chronic bilateral low back pain without sciatica Moist heat - acetaminophen-codeine (TYLENOL #3) 300-30 MG tablet; Take 1 tablet by mouth in the morning and at bedtime.  Dispense: 60 tablet; Refill: 5  6. Diabetic nephropathy associated with type 2 diabetes mellitus (HCC) - lisinopril (ZESTRIL) 10 MG tablet; Take 1 tablet (10 mg total) by mouth daily.  Dispense: 90 tablet; Refill: 1  7. Hypertriglyceridemia - rosuvastatin (CRESTOR) 5 MG tablet; Take 1 tablet (5 mg total) by mouth daily.  Dispense: 90 tablet; Refill: 1  8. Swelling - furosemide (LASIX) 20 MG tablet; Take 1 tablet (20 mg total) by mouth daily.  Dispense: 90 tablet; Refill: 1   Labs pending Health Maintenance reviewed Diet and exercise encouraged  Follow up plan: 3 months   [name] Daphine Deutscher, FNP

## 2022-08-29 NOTE — Patient Instructions (Signed)

## 2022-08-30 LAB — CMP14+EGFR
ALT: 44 IU/L (ref 0–44)
AST: 26 IU/L (ref 0–40)
Albumin/Globulin Ratio: 1.6 (ref 1.2–2.2)
Albumin: 4.5 g/dL (ref 4.1–5.1)
Alkaline Phosphatase: 59 IU/L (ref 44–121)
BUN/Creatinine Ratio: 16 (ref 9–20)
BUN: 17 mg/dL (ref 6–24)
Bilirubin Total: 0.3 mg/dL (ref 0.0–1.2)
CO2: 24 mmol/L (ref 20–29)
Calcium: 9.4 mg/dL (ref 8.7–10.2)
Chloride: 101 mmol/L (ref 96–106)
Creatinine, Ser: 1.09 mg/dL (ref 0.76–1.27)
Globulin, Total: 2.8 g/dL (ref 1.5–4.5)
Glucose: 105 mg/dL — ABNORMAL HIGH (ref 70–99)
Potassium: 4.7 mmol/L (ref 3.5–5.2)
Sodium: 140 mmol/L (ref 134–144)
Total Protein: 7.3 g/dL (ref 6.0–8.5)
eGFR: 87 mL/min/{1.73_m2} (ref >59)

## 2022-08-30 LAB — CBC WITH DIFFERENTIAL/PLATELET
Basophils Absolute: 0.1 10*3/uL (ref 0.0–0.2)
Basos: 1 %
EOS (ABSOLUTE): 0.1 10*3/uL (ref 0.0–0.4)
Eos: 1 %
Hematocrit: 43.8 % (ref 37.5–51.0)
Hemoglobin: 14.9 g/dL (ref 13.0–17.7)
Immature Grans (Abs): 0 10*3/uL (ref 0.0–0.1)
Immature Granulocytes: 0 %
Lymphocytes Absolute: 1.5 10*3/uL (ref 0.7–3.1)
Lymphs: 19 %
MCH: 31.6 pg (ref 26.6–33.0)
MCHC: 34 g/dL (ref 31.5–35.7)
MCV: 93 fL (ref 79–97)
Monocytes Absolute: 0.7 10*3/uL (ref 0.1–0.9)
Monocytes: 9 %
Neutrophils Absolute: 5.5 10*3/uL (ref 1.4–7.0)
Neutrophils: 70 %
Platelets: 262 10*3/uL (ref 150–450)
RBC: 4.71 x10E6/uL (ref 4.14–5.80)
RDW: 13.2 % (ref 11.6–15.4)
WBC: 7.9 10*3/uL (ref 3.4–10.8)

## 2022-08-30 LAB — LIPID PANEL
Chol/HDL Ratio: 3.5 ratio (ref 0.0–5.0)
Cholesterol, Total: 150 mg/dL (ref 100–199)
HDL: 43 mg/dL (ref >39)
LDL Chol Calc (NIH): 86 mg/dL (ref 0–99)
Triglycerides: 116 mg/dL (ref 0–149)
VLDL Cholesterol Cal: 21 mg/dL (ref 5–40)

## 2022-10-14 ENCOUNTER — Encounter: Payer: Self-pay | Admitting: Nurse Practitioner

## 2022-10-14 ENCOUNTER — Ambulatory Visit (INDEPENDENT_AMBULATORY_CARE_PROVIDER_SITE_OTHER): Payer: 59 | Admitting: Nurse Practitioner

## 2022-10-14 VITALS — BP 150/93 | HR 56 | Temp 97.6°F | Resp 20 | Ht 69.0 in | Wt >= 6400 oz

## 2022-10-14 DIAGNOSIS — L03116 Cellulitis of left lower limb: Secondary | ICD-10-CM

## 2022-10-14 DIAGNOSIS — L03115 Cellulitis of right lower limb: Secondary | ICD-10-CM

## 2022-10-14 MED ORDER — DOXYCYCLINE HYCLATE 100 MG PO TABS
100.0000 mg | ORAL_TABLET | Freq: Two times a day (BID) | ORAL | 0 refills | Status: DC
Start: 2022-10-14 — End: 2022-12-22

## 2022-10-14 NOTE — Patient Instructions (Signed)
Unna Boot Care An Unna boot is a type of bandage (dressing) for the foot and leg. The dressing is a wrap made of gauze that is soaked with a medicine called zinc oxide. The gauze may also include other lotions and medicines that help in wound healing, such as calamine. An Unna boot may be used to: Treat open sores (venous ulcers) or graft sites on the foot, heel, or leg. Help with swelling from conditions that affect the veins or lymphatic system (lymphedema). Treat skin conditions such as inflammation caused by poor blood flow (stasis dermatitis). Heal wounds on parts of the body below the hips (lower extremities). The dressing is applied by a health care provider. The gauze is wrapped around your lower extremity in several layers that overlap. These layers usually start at the toes and go up to the knee. A dry outer wrap goes over the medicated wrap for support and pressure (compression). Before applying the Unna boot, your health care provider will clean your leg and foot and may apply an antibiotic. You may be asked to raise (elevate) your leg for a while to reduce swelling before the boot is put on. The boot will dry and harden after it is applied. It may need to be changed or replaced once or twice a week. Follow these instructions at home: Boot care Wear the Unna boot as told by your health care provider. You may need to wear a slipper or shoe over the boot that is one or two sizes larger than normal. Do not stick anything inside the boot to scratch your skin. Doing that increases your risk of infection. Check the skin around the boot every day. Tell your health care provider about any concerns. Keep your Unna boot clean and dry. Check the area around the boot every day for signs of infection. Check for: Redness, swelling, or pain in your foot or toes. Fluid or blood coming from the boot. Warmth. Pus or a bad smell. A rash, itching, or red, swollen areas of skin (hives). Remove the boot  and call your health care provider if you have signs of poor blood flow, such as: Your toes tingle or become numb. Your toes turn cold or turn blue or pale. Your toes are more swollen or painful. You cannot move your toes. Bathing Do not take baths, swim, or use a hot tub until your health care provider approves. Ask your health care provider if you may take showers. You may only be allowed to take sponge baths. If your health care provider says that you can take a bath or shower: Do not let the Unna boot get wet. Cover the boot with a watertight covering when you shower. Keep your leg with the boot out of the bathtub when you take a bath. Activity Rest as told by your health care provider. Do not sit for a long time without moving. Get up to take short walks every 1-2 hours. This will improve blood flow and breathing. Ask for help if you feel weak or unsteady. You may walk with the boot once it has dried. Ask your health care provider how much walking is safe for you. General instructions Take over-the-counter and prescription medicines only as told by your health care provider. Keep your leg elevated above the level of your heart while you are sitting or lying down. This will decrease swelling. Do not sit with your knee bent for long periods of time. Do not use any products that contain nicotine   or tobacco. These products include cigarettes, chewing tobacco, and vaping devices, such as e-cigarettes. If you need help quitting, ask your health care provider. Keep all follow-up visits. Your health care provider will change your boot once or twice a week until it is no longer needed. Contact a health care provider if: Your skin feels itchy inside the boot. You feel burning or have a rash or hives in the boot area. You have a fever or chills. You have any signs of infection. You have more numbness or pain in your foot or toes. The skin on your foot or toes changes colors. This may include the  skin turning blue or pale or having patchy areas with spots. Your boot has been damaged or feels like it no longer fits like it should. This information is not intended to replace advice given to you by your health care provider. Make sure you discuss any questions you have with your health care provider. Document Revised: 09/09/2021 Document Reviewed: 09/09/2021 Elsevier Patient Education  2024 [employer]  

## 2022-10-14 NOTE — Progress Notes (Signed)
Subjective:    Patient ID: Justin Schmidt, male    DOB: 1980/10/08, 42 y.o.   MRN: [id number]   Chief Complaint: bilateral legs swelling   Pt presents with c/o swelling in both legs x 2 weeks; has been taking his lasix, no change in UOP; no fevers, blood sugars have been running low 100s; elevation does not help much, states he has been placing ice packs on his legs to help them not feel so hot.    Patient Active Problem List   Diagnosis Date Noted   Chronic pain of left knee 07/10/2020   History of pulmonary embolus (PE) 11/08/2018   History of DVT (deep vein thrombosis) 11/08/2018   Type 2 diabetes mellitus without complication, without long-term current use of insulin (HCC) 08/11/2018   Morbid obesity with BMI of 60.0-69.9, adult (HCC) 01/30/2017   Controlled substance agreement signed 09/11/2016   Chronic back pain 09/11/2016   Depression, recurrent (HCC) 09/11/2016   Fatty liver 05/17/2015   Hyperlipidemia associated with type 2 diabetes mellitus (HCC) 09/05/2013       Review of Systems  Constitutional:  Negative for chills and fatigue.  Respiratory:  Negative for shortness of breath.   Cardiovascular:  Positive for leg swelling. Negative for chest pain and palpitations.  Genitourinary:  Negative for decreased urine volume.  Skin:  Positive for color change. Negative for wound.       Redness and darkening of skin to lower legs since swelling started  Hematological:  Negative for adenopathy.  All other systems reviewed and are negative.      Objective:   Physical Exam Vitals and nursing note reviewed.  Constitutional:      General: He is not in acute distress.    Appearance: He is obese. He is not ill-appearing.  HENT:     Head: Normocephalic and atraumatic.  Cardiovascular:     Rate and Rhythm: Normal rate and regular rhythm.     Pulses: Normal pulses.     Heart sounds: Normal heart sounds. No murmur heard. Pulmonary:     Effort: Pulmonary effort is normal.  No respiratory distress.     Breath sounds: Normal breath sounds. No wheezing, rhonchi or rales.  Musculoskeletal:     Right lower leg: 2+ Pitting Edema present.     Left lower leg: 2+ Pitting Edema present.  Skin:    Capillary Refill: Capillary refill takes less than 2 seconds.     Findings: Erythema present.          Comments: BLE red/darkened/purplish color with scattered blisters forming, 2+ pitting edema, warm to touch  Neurological:     General: No focal deficit present.     Mental Status: He is alert and oriented to person, place, and time.  Psychiatric:        Mood and Affect: Mood normal.        Behavior: Behavior normal.       BP (!) 150/93   Pulse (!) 56   Temp 97.6 F (36.4 C) (Temporal)   Resp 20   Ht 5\' 9"  (1.753 m)   Wt (!) 446 lb (202.3 kg)   SpO2 93%   BMI 65.86 kg/m      Assessment & Plan:   [name] in today with chief complaint of bilateral legs swelling   1. Cellulitis of right lower extremity - Apply unna boot - doxycycline (VIBRA-TABS) 100 MG tablet; Take 1 tablet (100 mg total) by mouth 2 (two) times  daily. 1 po bid  Dispense: 20 tablet; Refill: 0  2. Cellulitis of left lower extremity - Apply unna boot - doxycycline (VIBRA-TABS) 100 MG tablet; Take 1 tablet (100 mg total) by mouth 2 (two) times daily. 1 po bid  Dispense: 20 tablet; Refill: 0  Elevate legs when sitting' increase lasix to 40mg  daily for the next 3 days Rto Friday for recheck   The above assessment and management plan was discussed with the patient. The patient verbalized understanding of and has agreed to the management plan. Patient is aware to call the clinic if symptoms persist or worsen. Patient is aware when to return to the clinic for a follow-up visit. Patient educated on when it is appropriate to go to the emergency department.   [name] Daphine Deutscher, FNP

## 2022-10-17 ENCOUNTER — Encounter: Payer: Self-pay | Admitting: Nurse Practitioner

## 2022-10-17 ENCOUNTER — Ambulatory Visit (INDEPENDENT_AMBULATORY_CARE_PROVIDER_SITE_OTHER): Payer: 59 | Admitting: Nurse Practitioner

## 2022-10-17 VITALS — BP 150/92 | HR 81 | Temp 97.5°F | Resp 20 | Ht 69.0 in | Wt >= 6400 oz

## 2022-10-17 DIAGNOSIS — L03116 Cellulitis of left lower limb: Secondary | ICD-10-CM

## 2022-10-17 DIAGNOSIS — L03115 Cellulitis of right lower limb: Secondary | ICD-10-CM | POA: Diagnosis not present

## 2022-10-17 NOTE — Progress Notes (Signed)
   Subjective:    Patient ID: Justin Schmidt, male    DOB: 10/07/1980, 42 y.o.   MRN: [id number]   Chief Complaint: cellulitis  HPI Patient was seen on 10/14/22 with lower ext cellulitis. He was treated with increase in lasix and doxycycline. Unna boots were applied. Here today for recheck.   Patient Active Problem List   Diagnosis Date Noted   Chronic pain of left knee 07/10/2020   History of pulmonary embolus (PE) 11/08/2018   History of DVT (deep vein thrombosis) 11/08/2018   Type 2 diabetes mellitus without complication, without long-term current use of insulin (HCC) 08/11/2018   Morbid obesity with BMI of 60.0-69.9, adult (HCC) 01/30/2017   Controlled substance agreement signed 09/11/2016   Chronic back pain 09/11/2016   Depression, recurrent (HCC) 09/11/2016   Fatty liver 05/17/2015   Hyperlipidemia associated with type 2 diabetes mellitus (HCC) 09/05/2013       Review of Systems  Constitutional:  Negative for diaphoresis.  Eyes:  Negative for pain.  Respiratory:  Negative for shortness of breath.   Cardiovascular:  Negative for chest pain, palpitations and leg swelling.  Gastrointestinal:  Negative for abdominal pain.  Endocrine: Negative for polydipsia.  Skin:  Negative for rash.  Neurological:  Negative for dizziness, weakness and headaches.  Hematological:  Does not bruise/bleed easily.  All other systems reviewed and are negative.      Objective:   Physical Exam Vitals reviewed.  Constitutional:      Appearance: Normal appearance.  Cardiovascular:     Rate and Rhythm: Normal rate and regular rhythm.     Heart sounds: Normal heart sounds.  Pulmonary:     Effort: Pulmonary effort is normal.     Breath sounds: Normal breath sounds.  Musculoskeletal:     Right lower leg: Edema (2+) present.     Left lower leg: Edema (2+) present.  Skin:    General: Skin is warm.  Neurological:     General: No focal deficit present.     Mental Status: He is alert and  oriented to person, place, and time.  Psychiatric:        Mood and Affect: Mood normal.        Behavior: Behavior normal.    BP (!) 150/92   Pulse 81   Temp (!) 97.5 F (36.4 C) (Temporal)   Resp 20   Ht 5\' 9"  (1.753 m)   Wt (!) 445 lb (201.9 kg)   BMI 65.72 kg/m         Assessment & Plan:   Justin Schmidt comes in today with chief complaint of Recheck legs   Diagnosis and orders addressed:  1. Cellulitis of right lower extremity 2. Cellulitis of left lower extremity Reapply unna boots Elevate legs when sitting Continue lasix 40mg  for 3 more days May remove unna boots hisself on monday   Follow up plan: prn   [name] Daphine Deutscher, FNP

## 2022-10-17 NOTE — Patient Instructions (Signed)
Unna Boot Care An Unna boot is a type of bandage (dressing) for the foot and leg. The dressing is a wrap made of gauze that is soaked with a medicine called zinc oxide. The gauze may also include other lotions and medicines that help in wound healing, such as calamine. An Unna boot may be used to: Treat open sores (venous ulcers) or graft sites on the foot, heel, or leg. Help with swelling from conditions that affect the veins or lymphatic system (lymphedema). Treat skin conditions such as inflammation caused by poor blood flow (stasis dermatitis). Heal wounds on parts of the body below the hips (lower extremities). The dressing is applied by a health care provider. The gauze is wrapped around your lower extremity in several layers that overlap. These layers usually start at the toes and go up to the knee. A dry outer wrap goes over the medicated wrap for support and pressure (compression). Before applying the Unna boot, your health care provider will clean your leg and foot and may apply an antibiotic. You may be asked to raise (elevate) your leg for a while to reduce swelling before the boot is put on. The boot will dry and harden after it is applied. It may need to be changed or replaced once or twice a week. Follow these instructions at home: Boot care Wear the Unna boot as told by your health care provider. You may need to wear a slipper or shoe over the boot that is one or two sizes larger than normal. Do not stick anything inside the boot to scratch your skin. Doing that increases your risk of infection. Check the skin around the boot every day. Tell your health care provider about any concerns. Keep your Unna boot clean and dry. Check the area around the boot every day for signs of infection. Check for: Redness, swelling, or pain in your foot or toes. Fluid or blood coming from the boot. Warmth. Pus or a bad smell. A rash, itching, or red, swollen areas of skin (hives). Remove the boot  and call your health care provider if you have signs of poor blood flow, such as: Your toes tingle or become numb. Your toes turn cold or turn blue or pale. Your toes are more swollen or painful. You cannot move your toes. Bathing Do not take baths, swim, or use a hot tub until your health care provider approves. Ask your health care provider if you may take showers. You may only be allowed to take sponge baths. If your health care provider says that you can take a bath or shower: Do not let the Unna boot get wet. Cover the boot with a watertight covering when you shower. Keep your leg with the boot out of the bathtub when you take a bath. Activity Rest as told by your health care provider. Do not sit for a long time without moving. Get up to take short walks every 1-2 hours. This will improve blood flow and breathing. Ask for help if you feel weak or unsteady. You may walk with the boot once it has dried. Ask your health care provider how much walking is safe for you. General instructions Take over-the-counter and prescription medicines only as told by your health care provider. Keep your leg elevated above the level of your heart while you are sitting or lying down. This will decrease swelling. Do not sit with your knee bent for long periods of time. Do not use any products that contain nicotine   or tobacco. These products include cigarettes, chewing tobacco, and vaping devices, such as e-cigarettes. If you need help quitting, ask your health care provider. Keep all follow-up visits. Your health care provider will change your boot once or twice a week until it is no longer needed. Contact a health care provider if: Your skin feels itchy inside the boot. You feel burning or have a rash or hives in the boot area. You have a fever or chills. You have any signs of infection. You have more numbness or pain in your foot or toes. The skin on your foot or toes changes colors. This may include the  skin turning blue or pale or having patchy areas with spots. Your boot has been damaged or feels like it no longer fits like it should. This information is not intended to replace advice given to you by your health care provider. Make sure you discuss any questions you have with your health care provider. Document Revised: 09/09/2021 Document Reviewed: 09/09/2021 Elsevier Patient Education  2024 [employer]  

## 2022-10-28 ENCOUNTER — Ambulatory Visit (INDEPENDENT_AMBULATORY_CARE_PROVIDER_SITE_OTHER): Payer: 59 | Admitting: Family Medicine

## 2022-10-28 ENCOUNTER — Ambulatory Visit (INDEPENDENT_AMBULATORY_CARE_PROVIDER_SITE_OTHER): Payer: 59

## 2022-10-28 ENCOUNTER — Encounter: Payer: Self-pay | Admitting: Family Medicine

## 2022-10-28 VITALS — BP 147/89 | HR 90 | Temp 98.0°F | Ht 69.0 in | Wt >= 6400 oz

## 2022-10-28 DIAGNOSIS — M25562 Pain in left knee: Secondary | ICD-10-CM

## 2022-10-28 DIAGNOSIS — G8929 Other chronic pain: Secondary | ICD-10-CM | POA: Diagnosis not present

## 2022-10-28 DIAGNOSIS — M79671 Pain in right foot: Secondary | ICD-10-CM

## 2022-10-28 DIAGNOSIS — M545 Low back pain, unspecified: Secondary | ICD-10-CM | POA: Diagnosis not present

## 2022-10-28 MED ORDER — DICLOFENAC SODIUM 75 MG PO TBEC: 75.0000 mg | DELAYED_RELEASE_TABLET | Freq: Two times a day (BID) | ORAL | 1 refills | Status: DC

## 2022-10-28 NOTE — Progress Notes (Signed)
Subjective:  Patient ID: Justin Schmidt, male    DOB: [birth year]  Age: 42 y.o. MRN: [id number]  CC: Foot Pain and Ankle Pain   HPI Justin Schmidt presents for 6 days of right foot pain from the heel to the toes, all over. NKI. He has an uneven walkway at home, but remembers no mishap. He has had similar sx before. Most recent 2 years ago. No injury then either. Pt. Is obese trying to lose weight. Increasing his exercise level. Pain 10/10 except he put on an old cast boot that helped reduce to 6/10 this AM     10/28/2022   11:21 AM 10/17/2022   11:09 AM 10/14/2022    2:17 PM  Depression screen PHQ 2/9  Decreased Interest 0 0 0  Down, Depressed, Hopeless 0 0 0  PHQ - 2 Score 0 0 0  Altered sleeping  0 0  Tired, decreased energy  0 1  Change in appetite  0 0  Feeling bad or failure about yourself   0 0  Trouble concentrating  0 0  Moving slowly or fidgety/restless  0 0  Suicidal thoughts  0 0  PHQ-9 Score  0 1  Difficult doing work/chores  Not difficult at all Somewhat difficult    History Thelmer has a past medical history of Acute saddle pulmonary embolism without acute cor pulmonale (HCC) (01/30/2017), Back pain, Diabetic nephropathy (HCC) (08/22/2021), Gallstones, Hyperlipidemia, Type 2 diabetes mellitus without complication, without long-term current use of insulin (HCC) (08/11/2018), and VTE (venous thromboembolism) (05/14/2017).   He has a past surgical history that includes Cholecystectomy (01/23/2012); Appendectomy (1991); and Tonsillectomy.   His family history includes Diabetes in his father, maternal grandmother, maternal uncle, mother, and another family member; Hyperlipidemia in his father; Hypertension in his father; Migraines in his sister.He reports that he has never smoked. He has never used smokeless tobacco. He reports current alcohol use of about 1.0 standard drink of alcohol per week. He reports that he does not use drugs.    ROS Review of Systems  Constitutional:   Negative for fever.  Respiratory:  Negative for shortness of breath.   Cardiovascular:  Negative for chest pain.  Musculoskeletal:  Negative for arthralgias.  Skin:  Negative for rash.    Objective:  BP (!) 147/89   Pulse 90   Temp 98 F (36.7 C)   Ht 5\' 9"  (1.753 m)   Wt (!) 444 lb 9.6 oz (201.7 kg)   SpO2 95%   BMI 65.66 kg/m   BP Readings from Last 3 Encounters:  10/28/22 (!) 147/89  10/17/22 (!) 150/92  10/14/22 (!) 150/93    Wt Readings from Last 3 Encounters:  10/28/22 (!) 444 lb 9.6 oz (201.7 kg)  10/17/22 (!) 445 lb (201.9 kg)  10/14/22 (!) 446 lb (202.3 kg)     Physical Exam Vitals reviewed.  Constitutional:      Appearance: He is well-developed.  HENT:     Head: Normocephalic and atraumatic.     Right Ear: External ear normal.     Left Ear: External ear normal.     Mouth/Throat:     Pharynx: No oropharyngeal exudate or posterior oropharyngeal erythema.  Eyes:     Pupils: Pupils are equal, round, and reactive to light.  Cardiovascular:     Rate and Rhythm: Normal rate and regular rhythm.     Heart sounds: No murmur heard. Pulmonary:     Effort: No respiratory distress.  Breath sounds: Normal breath sounds.  Musculoskeletal:        General: No tenderness.     Cervical back: Normal range of motion and neck supple.  Neurological:     Mental Status: He is alert and oriented to person, place, and time.       Assessment & Plan:   Lonza was seen today for foot pain and ankle pain.  Diagnoses and all orders for this visit:  Right foot pain -     DG Foot Complete Right; Future  Chronic pain of left knee -     diclofenac (VOLTAREN) 75 MG EC tablet; Take 1 tablet (75 mg total) by mouth 2 (two) times daily. For foot pain  Chronic midline low back pain without sciatica -     diclofenac (VOLTAREN) 75 MG EC tablet; Take 1 tablet (75 mg total) by mouth 2 (two) times daily. For foot pain       I have discontinued Fayrene Fearing C. Mikkelson "Collin"'s  ibuprofen. I have also changed his diclofenac. Additionally, I am having him maintain his multivitamin with minerals, Vitamin D, vitamin C, aspirin EC, OVER THE COUNTER MEDICATION, OVER THE COUNTER MEDICATION, GARLIC PO, CINNAMON PO, fluticasone, silver sulfADIAZINE, acetaminophen-codeine, escitalopram, lisinopril, rosuvastatin, furosemide, sitaGLIPtin, and doxycycline.  Allergies as of 10/28/2022       Reactions   Metformin And Related    diarrhea   Xarelto [rivaroxaban]    DROWSINESS, FATIGUE        Medication List        Accurate as of October 28, 2022 10:39 PM. If you have any questions, ask your nurse or doctor.          STOP taking these medications    ibuprofen 800 MG tablet Commonly known as: ADVIL Stopped by: Mechele Claude, MD       TAKE these medications    acetaminophen-codeine 300-30 MG tablet Commonly known as: TYLENOL #3 Take 1 tablet by mouth in the morning and at bedtime.   aspirin EC 81 MG tablet Take 162 mg by mouth daily.   CINNAMON PO Take by mouth.   diclofenac 75 MG EC tablet Commonly known as: VOLTAREN Take 1 tablet (75 mg total) by mouth 2 (two) times daily. For foot pain What changed:  medication strength how much to take additional instructions Changed by: Mechele Claude, MD   doxycycline 100 MG tablet Commonly known as: VIBRA-TABS Take 1 tablet (100 mg total) by mouth 2 (two) times daily. 1 po bid   escitalopram 10 MG tablet Commonly known as: LEXAPRO Take 1 tablet (10 mg total) by mouth daily.   fluticasone 50 MCG/ACT nasal spray Commonly known as: FLONASE SPRAY 2 SPRAYS INTO EACH NOSTRIL EVERY DAY   furosemide 20 MG tablet Commonly known as: LASIX Take 1 tablet (20 mg total) by mouth daily.   GARLIC PO Take by mouth.   lisinopril 10 MG tablet Commonly known as: ZESTRIL Take 1 tablet (10 mg total) by mouth daily.   multivitamin with minerals Tabs tablet Take 1 tablet by mouth daily.   OVER THE COUNTER  MEDICATION zinc   OVER THE COUNTER MEDICATION Flaxseed oil   rosuvastatin 5 MG tablet Commonly known as: CRESTOR Take 1 tablet (5 mg total) by mouth daily.   silver sulfADIAZINE 1 % cream Commonly known as: SILVADENE APPLY 1 APPLICATION TOPICALLY DAILY   sitaGLIPtin 50 MG tablet Commonly known as: Januvia Take 1 tablet (50 mg total) by mouth daily.   vitamin C 1000  MG tablet Take 1,000 mg by mouth daily.   Vitamin D 50 MCG (2000 UT) tablet Take 2,000 Units by mouth daily.         Follow-up: Return if symptoms worsen or fail to improve.  Mechele Claude, M.D.

## 2022-11-29 ENCOUNTER — Other Ambulatory Visit: Payer: Self-pay | Admitting: Family Medicine

## 2022-12-04 ENCOUNTER — Other Ambulatory Visit: Payer: Self-pay | Admitting: Nurse Practitioner

## 2022-12-04 DIAGNOSIS — M545 Low back pain, unspecified: Secondary | ICD-10-CM

## 2022-12-04 DIAGNOSIS — G8929 Other chronic pain: Secondary | ICD-10-CM

## 2022-12-18 ENCOUNTER — Ambulatory Visit (INDEPENDENT_AMBULATORY_CARE_PROVIDER_SITE_OTHER): Payer: 59

## 2022-12-18 VITALS — Ht 69.0 in | Wt >= 6400 oz

## 2022-12-18 DIAGNOSIS — Z0000 Encounter for general adult medical examination without abnormal findings: Secondary | ICD-10-CM

## 2022-12-18 NOTE — Progress Notes (Signed)
Subjective:   Justin Schmidt is a 42 y.o. male who presents for Medicare Annual/Subsequent preventive examination.  Visit Complete: Virtual  I connected with  Justin Schmidt on 12/18/22 by a audio enabled telemedicine application and verified that I am speaking with the correct person using two identifiers.  Patient Location: Home  Provider Location: Home Office  I discussed the limitations of evaluation and management by telemedicine. The patient expressed understanding and agreed to proceed.  Patient Medicare AWV questionnaire was completed by the patient on 12/18/2022; I have confirmed that all information answered by patient is correct and no changes since this date.  Review of Systems    Vital Signs: Unable to obtain new vitals due to this being a telehealth visit.  Cardiac Risk Factors include: advanced age (>72men, >23 women);diabetes mellitus;hypertension;male gender;dyslipidemia;sedentary lifestyle Nutrition Risk Assessment:  Has the patient had any N/V/D within the last 2 months?  No  Does the patient have any non-healing wounds?  No  Has the patient had any unintentional weight loss or weight gain?  No   Diabetes:  Is the patient diabetic?  Yes  If diabetic, was a CBG obtained today?  No  Did the patient bring in their glucometer from home?  No  How often do you monitor your CBG's? Daily .   Financial Strains and Diabetes Management:  Are you having any financial strains with the device, your supplies or your medication? No .  Does the patient want to be seen by Chronic Care Management for management of their diabetes?  No  Would the patient like to be referred to a Nutritionist or for Diabetic Management?  No   Diabetic Exams:  Diabetic Eye Exam: Completed 02/2022 Diabetic Foot Exam: Overdue, Pt has been advised about the importance in completing this exam. Pt is scheduled for diabetic foot exam on next office visit .     Objective:    Today's Vitals   12/18/22  1338  Weight: (!) 444 lb (201.4 kg)  Height: 5\' 9"  (1.753 m)   Body mass index is 65.57 kg/m.     12/18/2022    1:41 PM 12/16/2021    3:16 PM 12/13/2020    4:36 PM 08/12/2017    3:14 PM 05/04/2017    1:30 PM 03/14/2017    3:33 PM 01/29/2017    2:46 AM  Advanced Directives  Does Patient Have a Medical Advance Directive? No No No No No No No  Would patient like information on creating a medical advance directive? Yes (MAU/Ambulatory/Procedural Areas - Information given) No - Patient declined No - Patient declined No - Patient declined No - Patient declined  No - Patient declined    Current Medications (verified) Outpatient Encounter Medications as of 12/18/2022  Medication Sig   acetaminophen-codeine (TYLENOL #3) 300-30 MG tablet Take 1 tablet by mouth in the morning and at bedtime.   Ascorbic Acid (VITAMIN C) 1000 MG tablet Take 1,000 mg by mouth daily.   aspirin EC 81 MG tablet Take 162 mg by mouth daily.    Cholecalciferol (VITAMIN D) 2000 UNITS tablet Take 2,000 Units by mouth daily.   CINNAMON PO Take by mouth.   diclofenac (VOLTAREN) 75 MG EC tablet Take 1 tablet (75 mg total) by mouth 2 (two) times daily. For foot pain   doxycycline (VIBRA-TABS) 100 MG tablet Take 1 tablet (100 mg total) by mouth 2 (two) times daily. 1 po bid   escitalopram (LEXAPRO) 10 MG tablet Take 1 tablet (  10 mg total) by mouth daily.   fluticasone (FLONASE) 50 MCG/ACT nasal spray SPRAY 2 SPRAYS INTO EACH NOSTRIL EVERY DAY   furosemide (LASIX) 20 MG tablet Take 1 tablet (20 mg total) by mouth daily.   GARLIC PO Take by mouth.   lisinopril (ZESTRIL) 10 MG tablet Take 1 tablet (10 mg total) by mouth daily.   Multiple Vitamin (MULTIVITAMIN WITH MINERALS) TABS Take 1 tablet by mouth daily.   OVER THE COUNTER MEDICATION zinc   OVER THE COUNTER MEDICATION Flaxseed oil   rosuvastatin (CRESTOR) 5 MG tablet Take 1 tablet (5 mg total) by mouth daily.   silver sulfADIAZINE (SILVADENE) 1 % cream APPLY TO AFFECTED AREA  TOPICALLY DAILY   sitaGLIPtin (JANUVIA) 50 MG tablet Take 1 tablet (50 mg total) by mouth daily.   Facility-Administered Encounter Medications as of 12/18/2022  Medication   sodium chloride irrigation 0.9 %    Allergies (verified) Metformin and related and Xarelto [rivaroxaban]   History: Past Medical History:  Diagnosis Date   Acute saddle pulmonary embolism without acute cor pulmonale (HCC) 01/30/2017   Back pain    Diabetic nephropathy (HCC) 08/22/2021   Gallstones    Hyperlipidemia    Type 2 diabetes mellitus without complication, without long-term current use of insulin (HCC) 08/11/2018   VTE (venous thromboembolism) 05/14/2017   Past Surgical History:  Procedure Laterality Date   APPENDECTOMY  1991   CHOLECYSTECTOMY  01/23/2012   Procedure: LAPAROSCOPIC CHOLECYSTECTOMY;  Surgeon: Fabio Bering, MD;  Location: [facility];  Service: General;  Laterality: N/A;   TONSILLECTOMY     Family History  Problem Relation Age of Onset   Diabetes Mother    Diabetes Father    Hypertension Father    Hyperlipidemia Father    Migraines Sister    Diabetes Maternal Uncle    Diabetes Maternal Grandmother    Diabetes Other    Social History   Socioeconomic History   Marital status: Single    Spouse name: Not on file   Number of children: Not on file   Years of education: 12th grade   Highest education level: High school graduate  Occupational History   Occupation: disabled  Tobacco Use   Smoking status: Never   Smokeless tobacco: Never  Vaping Use   Vaping status: Never Used  Substance and Sexual Activity   Alcohol use: Yes    Alcohol/week: 1.0 standard drink of alcohol    Types: 1 Cans of beer per week    Comment: very rare   Drug use: No   Sexual activity: Never  Other Topics Concern   Not on file  Social History Narrative   Lives alone - no children   Social Determinants of Health   Financial Resource Strain: Low Risk  (12/18/2022)   Overall Financial Resource  Strain (CARDIA)    Difficulty of Paying Living Expenses: Not hard at all  Food Insecurity: No Food Insecurity (12/18/2022)   Hunger Vital Sign    Worried About Running Out of Food in the Last Year: Never true    Ran Out of Food in the Last Year: Never true  Transportation Needs: No Transportation Needs (12/18/2022)   PRAPARE - Administrator, Civil Service (Medical): No    Lack of Transportation (Non-Medical): No  Physical Activity: Insufficiently Active (12/18/2022)   Exercise Vital Sign    Days of Exercise per Week: 3 days    Minutes of Exercise per Session: 30 min  Stress: No Stress  Concern Present (12/18/2022)   [employer] of Occupational Health - Occupational Stress Questionnaire    Feeling of Stress : Not at all  Social Connections: Socially Isolated (12/18/2022)   Social Connection and Isolation Panel [NHANES]    Frequency of Communication with Friends and Family: More than three times a week    Frequency of Social Gatherings with Friends and Family: More than three times a week    Attends Religious Services: Never    Database administrator or Organizations: No    Attends Engineer, structural: Never    Marital Status: Never married    Tobacco Counseling Counseling given: Not Answered   Clinical Intake:  Pre-visit preparation completed: Yes  Pain : No/denies pain     Nutritional Risks: None Diabetes: Yes CBG done?: No Did pt. bring in CBG monitor from home?: No  How often do you need to have someone help you when you read instructions, pamphlets, or other written materials from your doctor or pharmacy?: 1 - Never  Interpreter Needed?: No  Information entered by :: Renie Ora, LPN   Activities of Daily Living    12/18/2022    1:41 PM  In your present state of health, do you have any difficulty performing the following activities:  Hearing? 0  Vision? 0  Difficulty concentrating or making decisions? 0  Walking or climbing stairs? 0   Dressing or bathing? 0  Doing errands, shopping? 0  Preparing Food and eating ? N  Using the Toilet? N  In the past six months, have you accidently leaked urine? N  Do you have problems with loss of bowel control? N  Managing your Medications? N  Managing your Finances? N  Housekeeping or managing your Housekeeping? N    Patient Care Team: Bennie Pierini, FNP as PCP - General (Family Medicine) Randa Spike, Kelton Pillar, LCSW as Social Worker (Licensed Clinical Social Worker) Conley Rolls, Westley Chandler, MD as Referring Physician (Optometry)  Indicate any recent Medical Services you may have received from other than [facility] providers in the past year (date may be approximate).     Assessment:   This is a routine wellness examination for Giavanni.  Hearing/Vision screen Vision Screening - Comments:: Wears rx glasses - up to date with routine eye exams with  [facility]  Dietary issues and exercise activities discussed:     Goals Addressed             This Visit's Progress    Have 3 meals a day   On track    Eat 3 meals daily that consist of lean proteins, fruits and vegetables Increase water intake        Depression Screen    12/18/2022    1:40 PM 10/28/2022   11:21 AM 10/17/2022   11:09 AM 10/14/2022    2:17 PM 08/29/2022    2:22 PM 06/03/2022    3:43 PM 06/03/2022    3:19 PM  PHQ 2/9 Scores  PHQ - 2 Score 0 0 0 0 1 0 2  PHQ- 9 Score   0 1 3 0 2    Fall Risk    12/18/2022    1:39 PM 10/28/2022   11:21 AM 10/17/2022   11:09 AM 10/14/2022    2:17 PM 08/29/2022    2:21 PM  Fall Risk   Falls in the past year? 0 0 0 0 0  Number falls in past yr: 0      Injury with Fall? 0  Risk for fall due to : No Fall Risks      Follow up Falls prevention discussed        MEDICARE RISK AT HOME: Medicare Risk at Home Any stairs in or around the home?: Yes If so, are there any without handrails?: No Home free of loose throw rugs in walkways, pet beds, electrical cords, etc?: Yes Adequate lighting  in your home to reduce risk of falls?: Yes Life alert?: No Use of a cane, walker or w/c?: No Grab bars in the bathroom?: Yes Shower chair or bench in shower?: Yes Elevated toilet seat or a handicapped toilet?: Yes  TIMED UP AND GO:  Was the test performed?  No    Cognitive Function:    12/01/2017    2:21 PM 11/19/2015    2:40 PM 09/29/2014    4:09 PM  MMSE - Mini Mental State Exam  Orientation to time 5 5 5   Orientation to Place 5 5 5   Registration 3 3 3   Attention/ Calculation 5 5 5   Recall 3 3 3   Language- name 2 objects 2 2 2   Language- repeat 1 1 1   Language- follow 3 step command 3 3 3   Language- read & follow direction 1 1 1   Write a sentence 1 1 1   Copy design 1 1 1   Total score 30 30 30         12/18/2022    1:42 PM 12/16/2021    3:32 PM  6CIT Screen  What Year? 0 points 0 points  What month? 0 points 0 points  What time? 0 points 0 points  Count back from 20 0 points 0 points  Months in reverse 0 points 0 points  Repeat phrase 0 points 0 points  Total Score 0 points 0 points    Immunizations Immunization History  Administered Date(s) Administered   Influenza,inj,Quad PF,6+ Mos 02/01/2015, 02/07/2016, 03/19/2018, 04/20/2019   Td 04/08/2005   Tdap 12/01/2017    TDAP status: Up to date  Flu Vaccine status: Due, Education has been provided regarding the importance of this vaccine. Advised may receive this vaccine at local pharmacy or Health Dept. Aware to provide a copy of the vaccination record if obtained from local pharmacy or Health Dept. Verbalized acceptance and understanding.  Pneumococcal vaccine status: Declined,  Education has been provided regarding the importance of this vaccine but patient still declined. Advised may receive this vaccine at local pharmacy or Health Dept. Aware to provide a copy of the vaccination record if obtained from local pharmacy or Health Dept. Verbalized acceptance and understanding.   Covid-19 vaccine status: Declined,  Education has been provided regarding the importance of this vaccine but patient still declined. Advised may receive this vaccine at local pharmacy or Health Dept.or vaccine clinic. Aware to provide a copy of the vaccination record if obtained from local pharmacy or Health Dept. Verbalized acceptance and understanding.  Qualifies for Shingles Vaccine? No   Zostavax completed No   Shingrix Completed?: No.    Education has been provided regarding the importance of this vaccine. Patient has been advised to call insurance company to determine out of pocket expense if they have not yet received this vaccine. Advised may also receive vaccine at local pharmacy or Health Dept. Verbalized acceptance and understanding.  Screening Tests Health Maintenance  Topic Date Due   COVID-19 Vaccine (1) Never done   OPHTHALMOLOGY EXAM  Never done   Diabetic kidney evaluation - Urine ACR  08/21/2022   INFLUENZA VACCINE  11/27/2022   HEMOGLOBIN A1C  03/01/2023   Diabetic kidney evaluation - eGFR measurement  08/29/2023   FOOT EXAM  08/29/2023   Medicare Annual Wellness (AWV)  12/18/2023   DTaP/Tdap/Td (3 - Td or Tdap) 12/02/2027   Hepatitis C Screening  Completed   HIV Screening  Completed   HPV VACCINES  Aged Out    Health Maintenance  Health Maintenance Due  Topic Date Due   COVID-19 Vaccine (1) Never done   OPHTHALMOLOGY EXAM  Never done   Diabetic kidney evaluation - Urine ACR  08/21/2022   INFLUENZA VACCINE  11/27/2022    Colorectal cancer screening: No longer required.   Lung Cancer Screening: (Low Dose CT Chest recommended if Age 74-80 years, 20 pack-year currently smoking OR have quit w/in 15years.) does not qualify.   Lung Cancer Screening Referral: n/a  Additional Screening:  Hepatitis C Screening: does not qualify; Completed 08/22/2016  Vision Screening: Recommended annual ophthalmology exams for early detection of glaucoma and other disorders of the eye. Is the patient up to date  with their annual eye exam?  Yes  Who is the provider or what is the name of the office in which the patient attends annual eye exams? [facility] If pt is not established with a provider, would they like to be referred to a provider to establish care? No .   Dental Screening: Recommended annual dental exams for proper oral hygiene   Community Resource Referral / Chronic Care Management: CRR required this visit?  No   CCM required this visit?  No     Plan:     I have personally reviewed and noted the following in the patient's chart:   Medical and social history Use of alcohol, tobacco or illicit drugs  Current medications and supplements including opioid prescriptions. Patient is not currently taking opioid prescriptions. Functional ability and status Nutritional status Physical activity Advanced directives List of other physicians Hospitalizations, surgeries, and ER visits in previous 12 months Vitals Screenings to include cognitive, depression, and falls Referrals and appointments  In addition, I have reviewed and discussed with patient certain preventive protocols, quality metrics, and best practice recommendations. A written personalized care plan for preventive services as well as general preventive health recommendations were provided to patient.     Lorrene Reid, LPN   1/61/0960   After Visit Summary: (MyChart) Due to this being a telephonic visit, the after visit summary with patients personalized plan was offered to patient via MyChart   Nurse Notes: none

## 2022-12-18 NOTE — Patient Instructions (Signed)
Mr. Clowes , Thank you for taking time to come for your Medicare Wellness Visit. I appreciate your ongoing commitment to your health goals. Please review the following plan we discussed and let me know if I can assist you in the future.   Referrals/Orders/Follow-Ups/Clinician Recommendations: Aim for 30 minutes of exercise or brisk walking, 6-8 glasses of water, and 5 servings of fruits and vegetables each day.   This is a list of the screening recommended for you and due dates:  Health Maintenance  Topic Date Due   COVID-19 Vaccine (1) Never done   Eye exam for diabetics  Never done   Yearly kidney health urinalysis for diabetes  08/21/2022   Flu Shot  11/27/2022   Hemoglobin A1C  03/01/2023   Yearly kidney function blood test for diabetes  08/29/2023   Complete foot exam   08/29/2023   Medicare Annual Wellness Visit  12/18/2023   DTaP/Tdap/Td vaccine (3 - Td or Tdap) 12/02/2027   Hepatitis C Screening  Completed   HIV Screening  Completed   HPV Vaccine  Aged Out    Advanced directives: (Provided) Advance directive discussed with you today. I have provided a copy for you to complete at home and have notarized. Once this is complete, please bring a copy in to our office so we can scan it into your chart. Information on Advanced Care Planning can be found at [facility] Advance Health Care Directives Advance Health Care Directives ([email or web address]    Next Medicare Annual Wellness Visit scheduled for next year: Yes Insert preventive care Attachment reference

## 2022-12-22 ENCOUNTER — Ambulatory Visit (INDEPENDENT_AMBULATORY_CARE_PROVIDER_SITE_OTHER): Payer: 59 | Admitting: Nurse Practitioner

## 2022-12-22 ENCOUNTER — Encounter: Payer: Self-pay | Admitting: Nurse Practitioner

## 2022-12-22 VITALS — BP 142/84 | HR 98 | Temp 96.3°F | Resp 20 | Ht 69.0 in | Wt >= 6400 oz

## 2022-12-22 DIAGNOSIS — R609 Edema, unspecified: Secondary | ICD-10-CM | POA: Diagnosis not present

## 2022-12-22 DIAGNOSIS — E781 Pure hyperglyceridemia: Secondary | ICD-10-CM | POA: Diagnosis not present

## 2022-12-22 DIAGNOSIS — Z6841 Body Mass Index (BMI) 40.0 and over, adult: Secondary | ICD-10-CM

## 2022-12-22 DIAGNOSIS — E1169 Type 2 diabetes mellitus with other specified complication: Secondary | ICD-10-CM | POA: Diagnosis not present

## 2022-12-22 DIAGNOSIS — Z7984 Long term (current) use of oral hypoglycemic drugs: Secondary | ICD-10-CM | POA: Diagnosis not present

## 2022-12-22 DIAGNOSIS — G8929 Other chronic pain: Secondary | ICD-10-CM

## 2022-12-22 DIAGNOSIS — M25562 Pain in left knee: Secondary | ICD-10-CM

## 2022-12-22 DIAGNOSIS — E1121 Type 2 diabetes mellitus with diabetic nephropathy: Secondary | ICD-10-CM | POA: Diagnosis not present

## 2022-12-22 DIAGNOSIS — E119 Type 2 diabetes mellitus without complications: Secondary | ICD-10-CM

## 2022-12-22 DIAGNOSIS — M545 Low back pain, unspecified: Secondary | ICD-10-CM | POA: Diagnosis not present

## 2022-12-22 DIAGNOSIS — E785 Hyperlipidemia, unspecified: Secondary | ICD-10-CM | POA: Diagnosis not present

## 2022-12-22 DIAGNOSIS — F339 Major depressive disorder, recurrent, unspecified: Secondary | ICD-10-CM

## 2022-12-22 LAB — BAYER DCA HB A1C WAIVED: HB A1C (BAYER DCA - WAIVED): 7.3 % — ABNORMAL HIGH (ref 4.8–5.6)

## 2022-12-22 MED ORDER — ESCITALOPRAM OXALATE 10 MG PO TABS
10.0000 mg | ORAL_TABLET | Freq: Every day | ORAL | 1 refills | Status: DC
Start: 2022-12-22 — End: 2023-01-12

## 2022-12-22 MED ORDER — DICLOFENAC SODIUM 75 MG PO TBEC
75.0000 mg | DELAYED_RELEASE_TABLET | Freq: Two times a day (BID) | ORAL | 1 refills | Status: DC
Start: 2022-12-22 — End: 2023-01-12

## 2022-12-22 MED ORDER — ACETAMINOPHEN-CODEINE 300-30 MG PO TABS
1.0000 | ORAL_TABLET | Freq: Two times a day (BID) | ORAL | 5 refills | Status: DC
Start: 2022-12-22 — End: 2024-01-01

## 2022-12-22 MED ORDER — ROSUVASTATIN CALCIUM 5 MG PO TABS
5.0000 mg | ORAL_TABLET | Freq: Every day | ORAL | 1 refills | Status: DC
Start: 2022-12-22 — End: 2023-01-12

## 2022-12-22 MED ORDER — FUROSEMIDE 20 MG PO TABS
20.0000 mg | ORAL_TABLET | Freq: Every day | ORAL | 1 refills | Status: DC
Start: 2022-12-22 — End: 2023-01-12

## 2022-12-22 MED ORDER — SITAGLIPTIN PHOSPHATE 50 MG PO TABS
50.0000 mg | ORAL_TABLET | Freq: Every day | ORAL | 1 refills | Status: DC
Start: 2022-12-22 — End: 2023-01-12

## 2022-12-22 MED ORDER — LISINOPRIL 10 MG PO TABS: 10.0000 mg | ORAL_TABLET | Freq: Every day | ORAL | 1 refills | Status: DC

## 2022-12-22 NOTE — Patient Instructions (Signed)

## 2022-12-22 NOTE — Progress Notes (Signed)
Subjective:    Patient ID: Justin Schmidt, male    DOB: [birth year], 42 y.o.   MRN: [id number]   Chief Complaint: medical management of chronic issues     HPI:  Justin Schmidt is a 42 y.o. who identifies as a male who was assigned male at birth.   Social history: Lives with: with room mate Work history: disability   Comes in today for follow up of the following chronic medical issues:  1. Hyperlipidemia associated with type 2 diabetes mellitus (HCC) Does not watch diet and says he exercises daily. Lab Results  Component Value Date   CHOL 150 08/29/2022   HDL 43 08/29/2022   LDLCALC 86 08/29/2022   TRIG 116 08/29/2022   CHOLHDL 3.5 08/29/2022    The 95-MWUX ASCVD risk score (Arnett DK, et al., 2019) is: 2.8%   2. Type 2 diabetes mellitus without complication, without long-term current use of insulin (HCC) Fasting blood sugars are running around 130-160. No low blood sugars Lab Results  Component Value Date   HGBA1C 7.4 (H) 08/29/2022    3. Depression, recurrent (HCC) Is on lexapro and is doing well.    12/22/2022    3:00 PM 12/18/2022    1:40 PM 10/28/2022   11:21 AM  Depression screen PHQ 2/9  Decreased Interest 0 0 0  Down, Depressed, Hopeless 0 0 0  PHQ - 2 Score 0 0 0  Altered sleeping 1    Tired, decreased energy 1    Change in appetite 0    Feeling bad or failure about yourself  0    Trouble concentrating 0    Moving slowly or fidgety/restless 0    Suicidal thoughts 0    PHQ-9 Score 2    Difficult doing work/chores Somewhat difficult       4. Chronic bilateral low back pain without sciatica Takes tylenol #3 bid. Pain assessment: Cause of pain- obesity Pain location- lower back Pain on scale of 1-10- 7-8/10 Frequency- daily What increases pain-to much bending  What makes pain Better-rest helps Effects on ADL - none Any change in general medical condition-none  Current opioids rx- tylenol #3 # meds rx- 60 Effectiveness of current  meds-helps Adverse reactions from pain meds-none Morphine equivalent-  Pill count performed-No Last drug screen - 02/28/22 ( high risk q39m, moderate risk q53m, low risk yearly ) Urine drug screen today- No Was the [facility] reviewed- yes  If yes were their any concerning findings? - no   Overdose risk: 1    04/20/2019   10:07 PM  Opioid Risk   Alcohol 0  Illegal Drugs 0  Rx Drugs 0  Alcohol 0  Illegal Drugs 0  Rx Drugs 0  Age between 16-45 years  1  History of Preadolescent Sexual Abuse 0  Psychological Disease 0  Depression 1  Opioid Risk Tool Scoring 2  Opioid Risk Interpretation Low Risk     Pain contract signed on:03/04/22   5. Morbid obesity with BMI of 60.0-69.9, adult (HCC) Weight is up 8lbs Wt Readings from Last 3 Encounters:  12/22/22 (!) 452 lb (205 kg)  12/18/22 (!) 444 lb (201.4 kg)  10/28/22 (!) 444 lb 9.6 oz (201.7 kg)   BMI Readings from Last 3 Encounters:  12/22/22 66.75 kg/m  12/18/22 65.57 kg/m  10/28/22 65.66 kg/m      New complaints: None today  Allergies  Allergen Reactions   Metformin And Related     diarrhea   Xarelto [  Rivaroxaban]     DROWSINESS, FATIGUE   Outpatient Encounter Medications as of 12/22/2022  Medication Sig   acetaminophen-codeine (TYLENOL #3) 300-30 MG tablet Take 1 tablet by mouth in the morning and at bedtime.   Ascorbic Acid (VITAMIN C) 1000 MG tablet Take 1,000 mg by mouth daily.   aspirin EC 81 MG tablet Take 162 mg by mouth daily.    Cholecalciferol (VITAMIN D) 2000 UNITS tablet Take 2,000 Units by mouth daily.   CINNAMON PO Take by mouth.   diclofenac (VOLTAREN) 75 MG EC tablet Take 1 tablet (75 mg total) by mouth 2 (two) times daily. For foot pain   doxycycline (VIBRA-TABS) 100 MG tablet Take 1 tablet (100 mg total) by mouth 2 (two) times daily. 1 po bid   escitalopram (LEXAPRO) 10 MG tablet Take 1 tablet (10 mg total) by mouth daily.   fluticasone (FLONASE) 50 MCG/ACT nasal spray SPRAY 2 SPRAYS INTO  EACH NOSTRIL EVERY DAY   furosemide (LASIX) 20 MG tablet Take 1 tablet (20 mg total) by mouth daily.   GARLIC PO Take by mouth.   lisinopril (ZESTRIL) 10 MG tablet Take 1 tablet (10 mg total) by mouth daily.   Multiple Vitamin (MULTIVITAMIN WITH MINERALS) TABS Take 1 tablet by mouth daily.   OVER THE COUNTER MEDICATION zinc   OVER THE COUNTER MEDICATION Flaxseed oil   rosuvastatin (CRESTOR) 5 MG tablet Take 1 tablet (5 mg total) by mouth daily.   silver sulfADIAZINE (SILVADENE) 1 % cream APPLY TO AFFECTED AREA TOPICALLY DAILY   sitaGLIPtin (JANUVIA) 50 MG tablet Take 1 tablet (50 mg total) by mouth daily.   Facility-Administered Encounter Medications as of 12/22/2022  Medication   sodium chloride irrigation 0.9 %    Past Surgical History:  Procedure Laterality Date   APPENDECTOMY  1991   CHOLECYSTECTOMY  01/23/2012   Procedure: LAPAROSCOPIC CHOLECYSTECTOMY;  Surgeon: Fabio Bering, MD;  Location: [facility];  Service: General;  Laterality: N/A;   TONSILLECTOMY      Family History  Problem Relation Age of Onset   Diabetes Mother    Diabetes Father    Hypertension Father    Hyperlipidemia Father    Migraines Sister    Diabetes Maternal Uncle    Diabetes Maternal Grandmother    Diabetes Other       Controlled substance contract: n/a     Review of Systems  Constitutional:  Negative for diaphoresis.  Eyes:  Negative for pain.  Respiratory:  Negative for shortness of breath.   Cardiovascular:  Negative for chest pain, palpitations and leg swelling.  Gastrointestinal:  Negative for abdominal pain.  Endocrine: Negative for polydipsia.  Skin:  Negative for rash.  Neurological:  Negative for dizziness, weakness and headaches.  Hematological:  Does not bruise/bleed easily.  All other systems reviewed and are negative.      Objective:   Physical Exam Vitals and nursing note reviewed.  Constitutional:      Appearance: Normal appearance. He is well-developed.  HENT:      Head: Normocephalic.     Nose: Nose normal.     Mouth/Throat:     Mouth: Mucous membranes are moist.     Pharynx: Oropharynx is clear.  Eyes:     Pupils: Pupils are equal, round, and reactive to light.  Neck:     Thyroid: No thyroid mass or thyromegaly.     Vascular: No carotid bruit or JVD.     Trachea: Phonation normal.  Cardiovascular:  Rate and Rhythm: Normal rate and regular rhythm.  Pulmonary:     Effort: Pulmonary effort is normal. No respiratory distress.     Breath sounds: Normal breath sounds.  Abdominal:     General: Bowel sounds are normal.     Palpations: Abdomen is soft.     Tenderness: There is no abdominal tenderness.  Musculoskeletal:        General: Normal range of motion.     Cervical back: Normal range of motion and neck supple.  Lymphadenopathy:     Cervical: No cervical adenopathy.  Skin:    General: Skin is warm and dry.  Neurological:     Mental Status: He is alert and oriented to person, place, and time.  Psychiatric:        Behavior: Behavior normal.        Thought Content: Thought content normal.        Judgment: Judgment normal.    BP (!) 142/84   Pulse 98   Temp (!) 96.3 F (35.7 C) (Temporal)   Resp 20   Ht 5\' 9"  (1.753 m)   Wt (!) 452 lb (205 kg)   SpO2 94%   BMI 66.75 kg/m         Assessment & Plan:   Justin Schmidt comes in today with chief complaint of Medical Management of Chronic Issues   Diagnosis and orders addressed:  1. Hyperlipidemia associated with type 2 diabetes mellitus (HCC) Low fat diet - Lipid panel  2. Type 2 diabetes mellitus without complication, without long-term current use of insulin (HCC) Stricter carb counting - Bayer DCA Hb A1c Waived - CBC with Differential/Platelet - CMP14+EGFR - Microalbumin / creatinine urine ratio - rosuvastatin (CRESTOR) 5 MG tablet; Take 1 tablet (5 mg total) by mouth daily.  Dispense: 90 tablet; Refill: 1 - sitaGLIPtin (JANUVIA) 50 MG tablet; Take 1 tablet (50 mg  total) by mouth daily.  Dispense: 90 tablet; Refill: 1 - lisinopril (ZESTRIL) 10 MG tablet; Take 1 tablet (10 mg total) by mouth daily.  Dispense: 90 tablet; Refill: 1  3. Depression, recurrent (HCC) Stress management - escitalopram (LEXAPRO) 10 MG tablet; Take 1 tablet (10 mg total) by mouth daily.  Dispense: 90 tablet; Refill: 1  4. Chronic bilateral low back pain without sciatica Back stretches - acetaminophen-codeine (TYLENOL #3) 300-30 MG tablet; Take 1 tablet by mouth in the morning and at bedtime.  Dispense: 60 tablet; Refill: 5  5. Morbid obesity with BMI of 60.0-69.9, adult (HCC) Discussed diet and exercise for person with BMI >25 Will recheck weight in 3-6 months   6. Chronic pain of left knee - diclofenac (VOLTAREN) 75 MG EC tablet; Take 1 tablet (75 mg total) by mouth 2 (two) times daily. For foot pain  Dispense: 60 tablet; Refill: 1  7. Chronic midline low back pain without sciatica - diclofenac (VOLTAREN) 75 MG EC tablet; Take 1 tablet (75 mg total) by mouth 2 (two) times daily. For foot pain  Dispense: 60 tablet; Refill: 1  8. Diabetic nephropathy associated with type 2 diabetes mellitus (HCC) Check feet daily Do not go barefooted  9. Hypertriglyceridemia Low fat diet - rosuvastatin (CRESTOR) 5 MG tablet; Take 1 tablet (5 mg total) by mouth daily.  Dispense: 90 tablet; Refill: 1  10. Swelling - furosemide (LASIX) 20 MG tablet; Take 1 tablet (20 mg total) by mouth daily.  Dispense: 90 tablet; Refill: 1   Labs pending Health Maintenance reviewed Diet and exercise encouraged  Follow  up plan: 3 months   [name] Daphine Deutscher, FNP

## 2022-12-23 LAB — LIPID PANEL
Chol/HDL Ratio: 3.8 ratio (ref 0.0–5.0)
Cholesterol, Total: 175 mg/dL (ref 100–199)
HDL: 46 mg/dL (ref >39)
LDL Chol Calc (NIH): 100 mg/dL — ABNORMAL HIGH (ref 0–99)
Triglycerides: 169 mg/dL — ABNORMAL HIGH (ref 0–149)
VLDL Cholesterol Cal: 29 mg/dL (ref 5–40)

## 2022-12-23 LAB — CBC WITH DIFFERENTIAL/PLATELET
Basophils Absolute: 0.1 10*3/uL (ref 0.0–0.2)
Basos: 1 %
EOS (ABSOLUTE): 0.1 10*3/uL (ref 0.0–0.4)
Eos: 1 %
Hematocrit: 43.5 % (ref 37.5–51.0)
Hemoglobin: 14.6 g/dL (ref 13.0–17.7)
Immature Grans (Abs): 0 10*3/uL (ref 0.0–0.1)
Immature Granulocytes: 0 %
Lymphocytes Absolute: 1.8 10*3/uL (ref 0.7–3.1)
Lymphs: 19 %
MCH: 31 pg (ref 26.6–33.0)
MCHC: 33.6 g/dL (ref 31.5–35.7)
MCV: 92 fL (ref 79–97)
Monocytes Absolute: 0.8 10*3/uL (ref 0.1–0.9)
Monocytes: 8 %
Neutrophils Absolute: 6.6 10*3/uL (ref 1.4–7.0)
Neutrophils: 71 %
Platelets: 296 10*3/uL (ref 150–450)
RBC: 4.71 x10E6/uL (ref 4.14–5.80)
RDW: 13.1 % (ref 11.6–15.4)
WBC: 9.3 10*3/uL (ref 3.4–10.8)

## 2022-12-23 LAB — MICROALBUMIN / CREATININE URINE RATIO
Creatinine, Urine: 49.8 mg/dL
Microalb/Creat Ratio: <6 mg/g{creat} (ref 0–29)
Microalbumin, Urine: <3 ug/mL

## 2022-12-23 LAB — CMP14+EGFR
ALT: 49 IU/L — ABNORMAL HIGH (ref 0–44)
AST: 28 IU/L (ref 0–40)
Albumin: 4.4 g/dL (ref 4.1–5.1)
Alkaline Phosphatase: 90 IU/L (ref 44–121)
BUN/Creatinine Ratio: 18 (ref 9–20)
BUN: 21 mg/dL (ref 6–24)
Bilirubin Total: <0.2 mg/dL (ref 0.0–1.2)
CO2: 21 mmol/L (ref 20–29)
Calcium: 9.5 mg/dL (ref 8.7–10.2)
Chloride: 100 mmol/L (ref 96–106)
Creatinine, Ser: 1.19 mg/dL (ref 0.76–1.27)
Globulin, Total: 3 g/dL (ref 1.5–4.5)
Glucose: 130 mg/dL — ABNORMAL HIGH (ref 70–99)
Potassium: 4.4 mmol/L (ref 3.5–5.2)
Sodium: 138 mmol/L (ref 134–144)
Total Protein: 7.4 g/dL (ref 6.0–8.5)
eGFR: 78 mL/min/{1.73_m2} (ref >59)

## 2023-01-06 ENCOUNTER — Encounter: Payer: Self-pay | Admitting: Nurse Practitioner

## 2023-01-06 ENCOUNTER — Ambulatory Visit (INDEPENDENT_AMBULATORY_CARE_PROVIDER_SITE_OTHER): Payer: 59 | Admitting: Nurse Practitioner

## 2023-01-06 VITALS — BP 117/73 | HR 94 | Temp 97.1°F | Resp 20 | Ht 69.0 in | Wt >= 6400 oz

## 2023-01-06 DIAGNOSIS — L03115 Cellulitis of right lower limb: Secondary | ICD-10-CM | POA: Diagnosis not present

## 2023-01-06 MED ORDER — CIPROFLOXACIN HCL 500 MG PO TABS
500.0000 mg | ORAL_TABLET | Freq: Two times a day (BID) | ORAL | 0 refills | Status: DC
Start: 2023-01-06 — End: 2023-01-12

## 2023-01-06 NOTE — Progress Notes (Signed)
   Subjective:    Patient ID: Justin Schmidt, male    DOB: [birth year], 42 y.o.   MRN: [id number]   Chief Complaint: bug bite  HPI  Patient in c/o bug bite right lower leg. He had cellulitis several weeks ago in that leg and was treated with doxycycline.  Patient Active Problem List   Diagnosis Date Noted   Chronic pain of left knee 07/10/2020   History of pulmonary embolus (PE) 11/08/2018   History of DVT (deep vein thrombosis) 11/08/2018   Type 2 diabetes mellitus without complication, without long-term current use of insulin (HCC) 08/11/2018   Morbid obesity with BMI of 60.0-69.9, adult (HCC) 01/30/2017   Controlled substance agreement signed 09/11/2016   Chronic back pain 09/11/2016   Depression, recurrent (HCC) 09/11/2016   Fatty liver 05/17/2015   Hyperlipidemia associated with type 2 diabetes mellitus (HCC) 09/05/2013       Review of Systems  Constitutional:  Negative for diaphoresis.  Eyes:  Negative for pain.  Respiratory:  Negative for shortness of breath.   Cardiovascular:  Negative for chest pain, palpitations and leg swelling.  Gastrointestinal:  Negative for abdominal pain.  Endocrine: Negative for polydipsia.  Skin:  Negative for rash.  Neurological:  Negative for dizziness, weakness and headaches.  Hematological:  Does not bruise/bleed easily.  All other systems reviewed and are negative.      Objective:   Physical Exam Constitutional:      Appearance: Normal appearance.  Cardiovascular:     Rate and Rhythm: Normal rate and regular rhythm.     Heart sounds: Normal heart sounds.  Pulmonary:     Effort: Pulmonary effort is normal.     Breath sounds: Normal breath sounds.  Skin:    General: Skin is warm.  Neurological:     General: No focal deficit present.     Mental Status: He is alert and oriented to person, place, and time.  Psychiatric:        Mood and Affect: Mood normal.        Behavior: Behavior normal.     BP 117/73   Pulse 94   Temp  (!) 97.1 F (36.2 C) (Temporal)   Resp 20   Ht 5\' 9"  (1.753 m)   Wt (!) 455 lb (206.4 kg)   SpO2 97%   BMI 67.19 kg/m        Assessment & Plan:   [name] in today with chief complaint of Right leg red and swollen   1. Cellulitis of right lower extremity Elevate legs Continue lasix - Ambulatory referral to Vascular Surgery - Apply unna boot  Meds ordered this encounter  Medications   ciprofloxacin (CIPRO) 500 MG tablet    Sig: Take 1 tablet (500 mg total) by mouth 2 (two) times daily.    Dispense:  10 tablet    Refill:  0    Order Specific Question:   Supervising Provider    Answer:   Arville Care A [[id number]]     The above assessment and management plan was discussed with the patient. The patient verbalized understanding of and has agreed to the management plan. Patient is aware to call the clinic if symptoms persist or worsen. Patient is aware when to return to the clinic for a follow-up visit. Patient educated on when it is appropriate to go to the emergency department.   [name] Daphine Deutscher, FNP

## 2023-01-06 NOTE — Patient Instructions (Signed)
Unna Boot Care An Unna boot is a type of bandage (dressing) for the foot and leg. The dressing is a wrap made of gauze that is soaked with a medicine called zinc oxide. The gauze may also include other lotions and medicines that help in wound healing, such as calamine. An Unna boot may be used to: Treat open sores (venous ulcers) or graft sites on the foot, heel, or leg. Help with swelling from conditions that affect the veins or lymphatic system (lymphedema). Treat skin conditions such as inflammation caused by poor blood flow (stasis dermatitis). Heal wounds on parts of the body below the hips (lower extremities). The dressing is applied by a health care provider. The gauze is wrapped around your lower extremity in several layers that overlap. These layers usually start at the toes and go up to the knee. A dry outer wrap goes over the medicated wrap for support and pressure (compression). Before applying the Unna boot, your health care provider will clean your leg and foot and may apply an antibiotic. You may be asked to raise (elevate) your leg for a while to reduce swelling before the boot is put on. The boot will dry and harden after it is applied. It may need to be changed or replaced once or twice a week. Follow these instructions at home: Boot care Wear the Unna boot as told by your health care provider. You may need to wear a slipper or shoe over the boot that is one or two sizes larger than normal. Do not stick anything inside the boot to scratch your skin. Doing that increases your risk of infection. Check the skin around the boot every day. Tell your health care provider about any concerns. Keep your Unna boot clean and dry. Check the area around the boot every day for signs of infection. Check for: Redness, swelling, or pain in your foot or toes. Fluid or blood coming from the boot. Warmth. Pus or a bad smell. A rash, itching, or red, swollen areas of skin (hives). Remove the boot  and call your health care provider if you have signs of poor blood flow, such as: Your toes tingle or become numb. Your toes turn cold or turn blue or pale. Your toes are more swollen or painful. You cannot move your toes. Bathing Do not take baths, swim, or use a hot tub until your health care provider approves. Ask your health care provider if you may take showers. You may only be allowed to take sponge baths. If your health care provider says that you can take a bath or shower: Do not let the Unna boot get wet. Cover the boot with a watertight covering when you shower. Keep your leg with the boot out of the bathtub when you take a bath. Activity Rest as told by your health care provider. Do not sit for a long time without moving. Get up to take short walks every 1-2 hours. This will improve blood flow and breathing. Ask for help if you feel weak or unsteady. You may walk with the boot once it has dried. Ask your health care provider how much walking is safe for you. General instructions Take over-the-counter and prescription medicines only as told by your health care provider. Keep your leg elevated above the level of your heart while you are sitting or lying down. This will decrease swelling. Do not sit with your knee bent for long periods of time. Do not use any products that contain nicotine   or tobacco. These products include cigarettes, chewing tobacco, and vaping devices, such as e-cigarettes. If you need help quitting, ask your health care provider. Keep all follow-up visits. Your health care provider will change your boot once or twice a week until it is no longer needed. Contact a health care provider if: Your skin feels itchy inside the boot. You feel burning or have a rash or hives in the boot area. You have a fever or chills. You have any signs of infection. You have more numbness or pain in your foot or toes. The skin on your foot or toes changes colors. This may include the  skin turning blue or pale or having patchy areas with spots. Your boot has been damaged or feels like it no longer fits like it should. This information is not intended to replace advice given to you by your health care provider. Make sure you discuss any questions you have with your health care provider. Document Revised: 09/09/2021 Document Reviewed: 09/09/2021 Elsevier Patient Education  2024 [employer]  

## 2023-01-07 ENCOUNTER — Ambulatory Visit: Payer: 59

## 2023-01-08 ENCOUNTER — Encounter (HOSPITAL_COMMUNITY): Payer: Self-pay

## 2023-01-08 ENCOUNTER — Emergency Department (HOSPITAL_COMMUNITY): Payer: 59

## 2023-01-08 ENCOUNTER — Inpatient Hospital Stay (HOSPITAL_COMMUNITY)
Admission: EM | Admit: 2023-01-08 | Discharge: 2023-01-12 | DRG: 603 | Disposition: A | Discharge status: Home / Self Care | Payer: 59 | Attending: Family Medicine | Admitting: Family Medicine

## 2023-01-08 ENCOUNTER — Other Ambulatory Visit: Payer: Self-pay

## 2023-01-08 DIAGNOSIS — Z6841 Body Mass Index (BMI) 40.0 and over, adult: Secondary | ICD-10-CM

## 2023-01-08 DIAGNOSIS — M79604 Pain in right leg: Secondary | ICD-10-CM | POA: Diagnosis not present

## 2023-01-08 DIAGNOSIS — K760 Fatty (change of) liver, not elsewhere classified: Secondary | ICD-10-CM | POA: Diagnosis present

## 2023-01-08 DIAGNOSIS — Z7984 Long term (current) use of oral hypoglycemic drugs: Secondary | ICD-10-CM | POA: Diagnosis not present

## 2023-01-08 DIAGNOSIS — Z888 Allergy status to other drugs, medicaments and biological substances status: Secondary | ICD-10-CM

## 2023-01-08 DIAGNOSIS — Z1152 Encounter for screening for COVID-19: Secondary | ICD-10-CM | POA: Diagnosis not present

## 2023-01-08 DIAGNOSIS — M79661 Pain in right lower leg: Secondary | ICD-10-CM | POA: Diagnosis not present

## 2023-01-08 DIAGNOSIS — Z86718 Personal history of other venous thrombosis and embolism: Secondary | ICD-10-CM | POA: Diagnosis not present

## 2023-01-08 DIAGNOSIS — I10 Essential (primary) hypertension: Secondary | ICD-10-CM | POA: Insufficient documentation

## 2023-01-08 DIAGNOSIS — A419 Sepsis, unspecified organism: Principal | ICD-10-CM

## 2023-01-08 DIAGNOSIS — Z833 Family history of diabetes mellitus: Secondary | ICD-10-CM | POA: Diagnosis not present

## 2023-01-08 DIAGNOSIS — R279 Unspecified lack of coordination: Secondary | ICD-10-CM | POA: Diagnosis not present

## 2023-01-08 DIAGNOSIS — Z86711 Personal history of pulmonary embolism: Secondary | ICD-10-CM | POA: Diagnosis not present

## 2023-01-08 DIAGNOSIS — F339 Major depressive disorder, recurrent, unspecified: Secondary | ICD-10-CM | POA: Diagnosis present

## 2023-01-08 DIAGNOSIS — R531 Weakness: Secondary | ICD-10-CM | POA: Diagnosis not present

## 2023-01-08 DIAGNOSIS — E1169 Type 2 diabetes mellitus with other specified complication: Secondary | ICD-10-CM | POA: Diagnosis present

## 2023-01-08 DIAGNOSIS — Z7982 Long term (current) use of aspirin: Secondary | ICD-10-CM | POA: Diagnosis not present

## 2023-01-08 DIAGNOSIS — Z8249 Family history of ischemic heart disease and other diseases of the circulatory system: Secondary | ICD-10-CM

## 2023-01-08 DIAGNOSIS — E781 Pure hyperglyceridemia: Secondary | ICD-10-CM | POA: Diagnosis not present

## 2023-01-08 DIAGNOSIS — R600 Localized edema: Secondary | ICD-10-CM | POA: Diagnosis not present

## 2023-01-08 DIAGNOSIS — Z83438 Family history of other disorder of lipoprotein metabolism and other lipidemia: Secondary | ICD-10-CM | POA: Diagnosis not present

## 2023-01-08 DIAGNOSIS — E1140 Type 2 diabetes mellitus with diabetic neuropathy, unspecified: Secondary | ICD-10-CM | POA: Diagnosis not present

## 2023-01-08 DIAGNOSIS — Z79899 Other long term (current) drug therapy: Secondary | ICD-10-CM

## 2023-01-08 DIAGNOSIS — G8929 Other chronic pain: Secondary | ICD-10-CM | POA: Diagnosis not present

## 2023-01-08 DIAGNOSIS — E785 Hyperlipidemia, unspecified: Secondary | ICD-10-CM | POA: Diagnosis present

## 2023-01-08 DIAGNOSIS — E119 Type 2 diabetes mellitus without complications: Secondary | ICD-10-CM

## 2023-01-08 DIAGNOSIS — L0390 Cellulitis, unspecified: Secondary | ICD-10-CM | POA: Diagnosis present

## 2023-01-08 DIAGNOSIS — Z743 Need for continuous supervision: Secondary | ICD-10-CM | POA: Diagnosis not present

## 2023-01-08 DIAGNOSIS — M6281 Muscle weakness (generalized): Secondary | ICD-10-CM | POA: Diagnosis not present

## 2023-01-08 DIAGNOSIS — M545 Low back pain, unspecified: Secondary | ICD-10-CM

## 2023-01-08 DIAGNOSIS — Z7401 Bed confinement status: Secondary | ICD-10-CM | POA: Diagnosis not present

## 2023-01-08 DIAGNOSIS — E1121 Type 2 diabetes mellitus with diabetic nephropathy: Secondary | ICD-10-CM | POA: Diagnosis present

## 2023-01-08 DIAGNOSIS — Z0389 Encounter for observation for other suspected diseases and conditions ruled out: Secondary | ICD-10-CM | POA: Diagnosis not present

## 2023-01-08 DIAGNOSIS — L03115 Cellulitis of right lower limb: Principal | ICD-10-CM

## 2023-01-08 DIAGNOSIS — E8809 Other disorders of plasma-protein metabolism, not elsewhere classified: Secondary | ICD-10-CM | POA: Diagnosis not present

## 2023-01-08 DIAGNOSIS — R609 Edema, unspecified: Secondary | ICD-10-CM | POA: Diagnosis not present

## 2023-01-08 DIAGNOSIS — R6889 Other general symptoms and signs: Secondary | ICD-10-CM | POA: Diagnosis not present

## 2023-01-08 LAB — COMPREHENSIVE METABOLIC PANEL
ALT: 32 U/L (ref 0–44)
AST: 17 U/L (ref 15–41)
Albumin: 3.7 g/dL (ref 3.5–5.0)
Alkaline Phosphatase: 51 U/L (ref 38–126)
Anion gap: 11 (ref 5–15)
BUN: 15 mg/dL (ref 6–20)
CO2: 26 mmol/L (ref 22–32)
Calcium: 8.7 mg/dL — ABNORMAL LOW (ref 8.9–10.3)
Chloride: 98 mmol/L (ref 98–111)
Creatinine, Ser: 1.19 mg/dL (ref 0.61–1.24)
GFR, Estimated: >60 mL/min (ref >60)
Glucose, Bld: 145 mg/dL — ABNORMAL HIGH (ref 70–99)
Potassium: 3.6 mmol/L (ref 3.5–5.1)
Sodium: 135 mmol/L (ref 135–145)
Total Bilirubin: 1.2 mg/dL (ref 0.3–1.2)
Total Protein: 8.1 g/dL (ref 6.5–8.1)

## 2023-01-08 LAB — PROTIME-INR
INR: 1.1 (ref 0.8–1.2)
Prothrombin Time: 14.8 s (ref 11.4–15.2)

## 2023-01-08 LAB — APTT: aPTT: 28 s (ref 24–36)

## 2023-01-08 LAB — CBC WITH DIFFERENTIAL/PLATELET
Abs Immature Granulocytes: 0.05 10*3/uL (ref 0.00–0.07)
Basophils Absolute: 0.1 10*3/uL (ref 0.0–0.1)
Basophils Relative: 0 %
Eosinophils Absolute: 0 10*3/uL (ref 0.0–0.5)
Eosinophils Relative: 0 %
HCT: 40.3 % (ref 39.0–52.0)
Hemoglobin: 13.4 g/dL (ref 13.0–17.0)
Immature Granulocytes: 0 %
Lymphocytes Relative: 9 %
Lymphs Abs: 1.2 10*3/uL (ref 0.7–4.0)
MCH: 31.2 pg (ref 26.0–34.0)
MCHC: 33.3 g/dL (ref 30.0–36.0)
MCV: 93.9 fL (ref 80.0–100.0)
Monocytes Absolute: 1.5 10*3/uL — ABNORMAL HIGH (ref 0.1–1.0)
Monocytes Relative: 11 %
Neutro Abs: 11.1 10*3/uL — ABNORMAL HIGH (ref 1.7–7.7)
Neutrophils Relative %: 80 %
Platelets: 273 10*3/uL (ref 150–400)
RBC: 4.29 MIL/uL (ref 4.22–5.81)
RDW: 13.5 % (ref 11.5–15.5)
WBC: 13.9 10*3/uL — ABNORMAL HIGH (ref 4.0–10.5)
nRBC: 0 % (ref 0.0–0.2)

## 2023-01-08 LAB — RESP PANEL BY RT-PCR (RSV, FLU A&B, COVID)  RVPGX2
Influenza A by PCR: NEGATIVE
Influenza B by PCR: NEGATIVE
Resp Syncytial Virus by PCR: NEGATIVE
SARS Coronavirus 2 by RT PCR: NEGATIVE

## 2023-01-08 LAB — URINALYSIS, ROUTINE W REFLEX MICROSCOPIC
Bilirubin Urine: NEGATIVE
Glucose, UA: NEGATIVE mg/dL
Hgb urine dipstick: NEGATIVE
Ketones, ur: 5 mg/dL — AB
Leukocytes,Ua: NEGATIVE
Nitrite: NEGATIVE
Protein, ur: NEGATIVE mg/dL
Specific Gravity, Urine: 1.012 (ref 1.005–1.030)
pH: 6 (ref 5.0–8.0)

## 2023-01-08 LAB — CBG MONITORING, ED: Glucose-Capillary: 135 mg/dL — ABNORMAL HIGH (ref 70–99)

## 2023-01-08 LAB — GLUCOSE, CAPILLARY: Glucose-Capillary: 125 mg/dL — ABNORMAL HIGH (ref 70–99)

## 2023-01-08 LAB — LACTIC ACID, PLASMA: Lactic Acid, Venous: 1.7 mmol/L (ref 0.5–1.9)

## 2023-01-08 MED ORDER — LACTATED RINGERS IV SOLN: INTRAVENOUS | Status: DC

## 2023-01-08 MED ORDER — HYDRALAZINE HCL 20 MG/ML IJ SOLN: 5.0000 mg | INTRAMUSCULAR | Status: DC | PRN

## 2023-01-08 MED ORDER — SODIUM CHLORIDE 0.9 % IV SOLN
2.0000 g | Freq: Once | INTRAVENOUS | Status: AC
  Administered 2023-01-08: 2 g via INTRAVENOUS

## 2023-01-08 MED ORDER — LACTATED RINGERS IV SOLN: INTRAVENOUS | Status: AC

## 2023-01-08 MED ORDER — ACETAMINOPHEN 325 MG PO TABS
650.0000 mg | ORAL_TABLET | Freq: Four times a day (QID) | ORAL | Status: DC | PRN
  Administered 2023-01-09 – 2023-01-12 (×2): 650 mg via ORAL
  Filled 2023-01-08 (×2): qty 2

## 2023-01-08 MED ORDER — VANCOMYCIN HCL 1500 MG/300ML IV SOLN
1500.0000 mg | Freq: Two times a day (BID) | INTRAVENOUS | Status: DC
  Administered 2023-01-09 (×2): 1500 mg via INTRAVENOUS
  Filled 2023-01-08 (×3): qty 300

## 2023-01-08 MED ORDER — ALBUTEROL SULFATE (2.5 MG/3ML) 0.083% IN NEBU: 2.5000 mg | INHALATION_SOLUTION | RESPIRATORY_TRACT | Status: DC | PRN

## 2023-01-08 MED ORDER — ESCITALOPRAM OXALATE 10 MG PO TABS
10.0000 mg | ORAL_TABLET | Freq: Every day | ORAL | Status: DC
  Administered 2023-01-09 – 2023-01-12 (×4): 10 mg via ORAL
  Filled 2023-01-08 (×4): qty 1

## 2023-01-08 MED ORDER — INSULIN ASPART 100 UNIT/ML IJ SOLN
0.0000 [IU] | Freq: Three times a day (TID) | INTRAMUSCULAR | Status: DC
  Administered 2023-01-09 – 2023-01-10 (×5): 2 [IU] via SUBCUTANEOUS
  Administered 2023-01-11: 5 [IU] via SUBCUTANEOUS
  Administered 2023-01-11 – 2023-01-12 (×2): 3 [IU] via SUBCUTANEOUS
  Administered 2023-01-12: 2 [IU] via SUBCUTANEOUS

## 2023-01-08 MED ORDER — INSULIN ASPART 100 UNIT/ML IJ SOLN: 0.0000 [IU] | Freq: Every day | INTRAMUSCULAR | Status: DC

## 2023-01-08 MED ORDER — ONDANSETRON HCL 4 MG PO TABS: 4.0000 mg | ORAL_TABLET | Freq: Four times a day (QID) | ORAL | Status: DC | PRN

## 2023-01-08 MED ORDER — ROSUVASTATIN CALCIUM 10 MG PO TABS
5.0000 mg | ORAL_TABLET | Freq: Every day | ORAL | Status: DC
  Administered 2023-01-09 – 2023-01-12 (×4): 5 mg via ORAL
  Filled 2023-01-08 (×4): qty 1

## 2023-01-08 MED ORDER — POLYETHYLENE GLYCOL 3350 17 G PO PACK: 17.0000 g | PACK | Freq: Every day | ORAL | Status: DC | PRN

## 2023-01-08 MED ORDER — LISINOPRIL 10 MG PO TABS
10.0000 mg | ORAL_TABLET | Freq: Every day | ORAL | Status: DC
  Administered 2023-01-08 – 2023-01-12 (×5): 10 mg via ORAL
  Filled 2023-01-08 (×5): qty 1

## 2023-01-08 MED ORDER — VANCOMYCIN HCL 500 MG/100ML IV SOLN
500.0000 mg | Freq: Once | INTRAVENOUS | Status: AC
  Administered 2023-01-08: 500 mg via INTRAVENOUS
  Filled 2023-01-08: qty 100

## 2023-01-08 MED ORDER — ACETAMINOPHEN 325 MG PO TABS
650.0000 mg | ORAL_TABLET | Freq: Once | ORAL | Status: AC
  Administered 2023-01-08: 650 mg via ORAL
  Filled 2023-01-08: qty 2

## 2023-01-08 MED ORDER — VANCOMYCIN HCL 2000 MG/400ML IV SOLN
2000.0000 mg | Freq: Once | INTRAVENOUS | Status: AC
  Administered 2023-01-08: 2000 mg via INTRAVENOUS
  Filled 2023-01-08: qty 400

## 2023-01-08 MED ORDER — ONDANSETRON HCL 4 MG/2ML IJ SOLN: 4.0000 mg | Freq: Four times a day (QID) | INTRAMUSCULAR | Status: DC | PRN

## 2023-01-08 MED ORDER — MORPHINE SULFATE (PF) 4 MG/ML IV SOLN
4.0000 mg | Freq: Once | INTRAVENOUS | Status: AC
  Administered 2023-01-08: 4 mg via INTRAVENOUS
  Filled 2023-01-08: qty 1

## 2023-01-08 MED ORDER — SODIUM CHLORIDE 0.9 % IV SOLN
2.0000 g | Freq: Three times a day (TID) | INTRAVENOUS | Status: DC
  Administered 2023-01-09 – 2023-01-10 (×3): 2 g via INTRAVENOUS
  Filled 2023-01-08 (×4): qty 12.5

## 2023-01-08 MED ORDER — HEPARIN (PORCINE) 25000 UT/250ML-% IV SOLN
2600.0000 [IU]/h | INTRAVENOUS | Status: DC
  Administered 2023-01-08: 2200 [IU]/h via INTRAVENOUS
  Administered 2023-01-09: 2600 [IU]/h via INTRAVENOUS
  Filled 2023-01-08 (×2): qty 250

## 2023-01-08 MED ORDER — SODIUM CHLORIDE 0.9 % IV SOLN
2.0000 g | INTRAVENOUS | Status: DC
  Administered 2023-01-08: 2 g via INTRAVENOUS
  Filled 2023-01-08: qty 20

## 2023-01-08 MED ORDER — HEPARIN BOLUS VIA INFUSION
7500.0000 [IU] | Freq: Once | INTRAVENOUS | Status: AC
  Administered 2023-01-08: 7500 [IU] via INTRAVENOUS

## 2023-01-08 MED ORDER — ACETAMINOPHEN 650 MG RE SUPP: 650.0000 mg | Freq: Four times a day (QID) | RECTAL | Status: DC | PRN

## 2023-01-08 NOTE — ED Notes (Signed)
ED TO INPATIENT HANDOFF REPORT  ED Nurse Name and Phone #: Gearlean Alf Name/Age/Gender Justin Schmidt 42 y.o. male Room/Bed: APA12/APA12  Code Status   Code Status: Full Code  Home/SNF/Other Home Patient oriented to: self, place, time, and situation Is this baseline? Yes   Triage Complete: Triage complete  Chief Complaint Cellulitis [L03.90]  Triage Note Brought by EMS Seen for B/L LE cellulitis 3 days ago at PCP.  Today increased RIGHT foot pain, chills and unable to put weight on RIGHT foot.      Allergies Allergies  Allergen Reactions   Metformin And Related     diarrhea   Xarelto [Rivaroxaban]     DROWSINESS, FATIGUE    Level of Care/Admitting Diagnosis ED Disposition     ED Disposition  Admit   Condition  --   Comment  Hospital Area: [facility] [[id number]]  Level of Care: Telemetry [5]  Covid Evaluation: Asymptomatic - no recent exposure (last 10 days) testing not required  Diagnosis: Cellulitis [[id number]]  Admitting Physician: Chiquita Loth  Attending Physician: Starleen Arms [[id number]]  Certification:: I certify this patient will need inpatient services for at least 2 midnights  Expected Medical Readiness: 01/11/2023          B Medical/Surgery History Past Medical History:  Diagnosis Date   Acute saddle pulmonary embolism without acute cor pulmonale (HCC) 01/30/2017   Back pain    Diabetic nephropathy (HCC) 08/22/2021   Gallstones    Hyperlipidemia    Type 2 diabetes mellitus without complication, without long-term current use of insulin (HCC) 08/11/2018   VTE (venous thromboembolism) 05/14/2017   Past Surgical History:  Procedure Laterality Date   APPENDECTOMY  1991   CHOLECYSTECTOMY  01/23/2012   Procedure: LAPAROSCOPIC CHOLECYSTECTOMY;  Surgeon: Fabio Bering, MD;  Location: [facility];  Service: General;  Laterality: N/A;   TONSILLECTOMY       A IV Location/Drains/Wounds Patient Lines/Drains/Airways Status      Active Line/Drains/Airways     Name Placement date Placement time Site Days   Peripheral IV 01/08/23 20 G Anterior;Distal;Left;Upper Arm 01/08/23  1736  Arm  less than 1   Peripheral IV 01/08/23 20 G Posterior;Right Hand 01/08/23  2040  Hand  less than 1            Intake/Output Last 24 hours  Intake/Output Summary (Last 24 hours) at 01/08/2023 2216 Last data filed at 01/08/2023 2114 Gross per 24 hour  Intake 200 ml  Output --  Net 200 ml    Labs/Imaging Results for orders placed or performed during the hospital encounter of 01/08/23 (from the past 48 hour(s))  CBC with Differential     Status: Abnormal   Collection Time: 01/08/23  3:02 PM  Result Value Ref Range   WBC 13.9 (H) 4.0 - 10.5 K/uL   RBC 4.29 4.22 - 5.81 MIL/uL   Hemoglobin 13.4 13.0 - 17.0 g/dL   HCT 19.1 47.8 - 29.5 %   MCV 93.9 80.0 - 100.0 fL   MCH 31.2 26.0 - 34.0 pg   MCHC 33.3 30.0 - 36.0 g/dL   RDW 62.1 30.8 - 65.7 %   Platelets 273 150 - 400 K/uL   nRBC 0.0 0.0 - 0.2 %   Neutrophils Relative % 80 %   Neutro Abs 11.1 (H) 1.7 - 7.7 K/uL   Lymphocytes Relative 9 %   Lymphs Abs 1.2 0.7 - 4.0 K/uL   Monocytes Relative 11 %  Monocytes Absolute 1.5 (H) 0.1 - 1.0 K/uL   Eosinophils Relative 0 %   Eosinophils Absolute 0.0 0.0 - 0.5 K/uL   Basophils Relative 0 %   Basophils Absolute 0.1 0.0 - 0.1 K/uL   Immature Granulocytes 0 %   Abs Immature Granulocytes 0.05 0.00 - 0.07 K/uL    Comment: Performed at [facility], [address], [place], [place] [address]  Comprehensive metabolic panel     Status: Abnormal   Collection Time: 01/08/23  3:02 PM  Result Value Ref Range   Sodium 135 135 - 145 mmol/L   Potassium 3.6 3.5 - 5.1 mmol/L   Chloride 98 98 - 111 mmol/L   CO2 26 22 - 32 mmol/L   Glucose, Bld 145 (H) 70 - 99 mg/dL    Comment: Glucose reference range applies only to samples taken after fasting for at least 8 hours.   BUN 15 6 - 20 mg/dL   Creatinine, Ser 6.04 0.61 - 1.24 mg/dL   Calcium 8.7  (L) 8.9 - 10.3 mg/dL   Total Protein 8.1 6.5 - 8.1 g/dL   Albumin 3.7 3.5 - 5.0 g/dL   AST 17 15 - 41 U/L   ALT 32 0 - 44 U/L   Alkaline Phosphatase 51 38 - 126 U/L   Total Bilirubin 1.2 0.3 - 1.2 mg/dL   GFR, Estimated >54 >09 mL/min    Comment: (NOTE) Calculated using the CKD-EPI Creatinine Equation (2021)    Anion gap 11 5 - 15    Comment: Performed at [facility], [address], [place], [place] [address]  Lactic acid, plasma     Status: None   Collection Time: 01/08/23  3:02 PM  Result Value Ref Range   Lactic Acid, Venous 1.7 0.5 - 1.9 mmol/L    Comment: Performed at [facility], [address], [place], [place] [address]  Protime-INR     Status: None   Collection Time: 01/08/23  3:02 PM  Result Value Ref Range   Prothrombin Time 14.8 11.4 - 15.2 seconds   INR 1.1 0.8 - 1.2    Comment: (NOTE) INR goal varies based on device and disease states. Performed at [facility], [address], [place], [place] [address]   APTT     Status: None   Collection Time: 01/08/23  3:02 PM  Result Value Ref Range   aPTT 28 24 - 36 seconds    Comment: Performed at [facility], [address] [place], [place] [address]  Blood Culture (routine x 2)     Status: None (Preliminary result)   Collection Time: 01/08/23  3:02 PM   Specimen: Right Antecubital; Blood  Result Value Ref Range   Specimen Description RIGHT ANTECUBITAL    Special Requests      BOTTLES DRAWN AEROBIC AND ANAEROBIC Blood Culture adequate volume Performed at [facility], [address], [place], [place] [address]    Culture PENDING    Report Status PENDING   Resp panel by RT-PCR (RSV, Flu A&B, Covid) Anterior Nasal Swab     Status: None   Collection Time: 01/08/23  5:42 PM   Specimen: Anterior Nasal Swab  Result Value Ref Range   SARS Coronavirus 2 by RT PCR NEGATIVE NEGATIVE    Comment: (NOTE) SARS-CoV-2 target nucleic acids are NOT DETECTED.  The SARS-CoV-2 RNA is generally detectable in upper  respiratory specimens during the acute phase of infection. The lowest concentration of SARS-CoV-2 viral copies this assay can detect is 138  copies/mL. A negative result does not preclude SARS-Cov-2 infection and should not be used as the sole basis for treatment or other patient management decisions. A negative result may occur with  improper specimen collection/handling, submission of specimen other than nasopharyngeal swab, presence of viral mutation(s) within the areas targeted by this assay, and inadequate number of viral copies(<138 copies/mL). A negative result must be combined with clinical observations, patient history, and epidemiological information. The expected result is Negative.  Fact Sheet for Patients:  BloggerCourse.com  Fact Sheet for Healthcare Providers:  SeriousBroker.it  This test is no t yet approved or cleared by the [place] FDA and  has been authorized for detection and/or diagnosis of SARS-CoV-2 by FDA under an Emergency Use Authorization (EUA). This EUA will remain  in effect (meaning this test can be used) for the duration of the COVID-19 declaration under Section 564(b)(1) of the Act, 21 U.S.C.section 360bbb-3(b)(1), unless the authorization is terminated  or revoked sooner.       Influenza A by PCR NEGATIVE NEGATIVE   Influenza B by PCR NEGATIVE NEGATIVE    Comment: (NOTE) The Xpert Xpress SARS-CoV-2/FLU/RSV plus assay is intended as an aid in the diagnosis of influenza from Nasopharyngeal swab specimens and should not be used as a sole basis for treatment. Nasal washings and aspirates are unacceptable for Xpert Xpress SARS-CoV-2/FLU/RSV testing.  Fact Sheet for Patients: BloggerCourse.com  Fact Sheet for Healthcare Providers: SeriousBroker.it  This test is not yet approved or cleared by the [place] FDA and has been authorized for  detection and/or diagnosis of SARS-CoV-2 by FDA under an Emergency Use Authorization (EUA). This EUA will remain in effect (meaning this test can be used) for the duration of the COVID-19 declaration under Section 564(b)(1) of the Act, 21 U.S.C. section 360bbb-3(b)(1), unless the authorization is terminated or revoked.     Resp Syncytial Virus by PCR NEGATIVE NEGATIVE    Comment: (NOTE) Fact Sheet for Patients: BloggerCourse.com  Fact Sheet for Healthcare Providers: SeriousBroker.it  This test is not yet approved or cleared by the [place] FDA and has been authorized for detection and/or diagnosis of SARS-CoV-2 by FDA under an Emergency Use Authorization (EUA). This EUA will remain in effect (meaning this test can be used) for the duration of the COVID-19 declaration under Section 564(b)(1) of the Act, 21 U.S.C. section 360bbb-3(b)(1), unless the authorization is terminated or revoked.  Performed at [facility], [address], [place], [place] [address]   Blood Culture (routine x 2)     Status: None (Preliminary result)   Collection Time: 01/08/23  5:42 PM   Specimen: BLOOD  Result Value Ref Range   Specimen Description BLOOD BLOOD LEFT ARM    Special Requests      BOTTLES DRAWN AEROBIC AND ANAEROBIC Blood Culture results may not be optimal due to an excessive volume of blood received in culture bottles Performed at [facility], [address], [place], [place] [address]    Culture PENDING    Report Status PENDING   Urinalysis, Routine w reflex microscopic -Urine, Clean Catch     Status: Abnormal   Collection Time: 01/08/23  5:42 PM  Result Value Ref Range   Color, Urine YELLOW YELLOW   APPearance CLEAR CLEAR   Specific Gravity, Urine 1.012 1.005 - 1.030   pH 6.0 5.0 - 8.0   Glucose, UA NEGATIVE NEGATIVE mg/dL   Hgb urine dipstick NEGATIVE NEGATIVE   Bilirubin Urine NEGATIVE NEGATIVE   Ketones, ur 5 (A)  NEGATIVE  mg/dL   Protein, ur NEGATIVE NEGATIVE mg/dL   Nitrite NEGATIVE NEGATIVE   Leukocytes,Ua NEGATIVE NEGATIVE    Comment: Performed at [facility], [address] [place], [place] [address]  CBG monitoring, ED     Status: Abnormal   Collection Time: 01/08/23 10:10 PM  Result Value Ref Range   Glucose-Capillary 135 (H) 70 - 99 mg/dL    Comment: Glucose reference range applies only to samples taken after fasting for at least 8 hours.   DG Tibia/Fibula Right  Result Date: 01/08/2023 CLINICAL DATA:  Questionable sepsis. Bilateral lower extremity cellulitis. Increased right lower extremity pain. EXAM: RIGHT TIBIA AND FIBULA - 2 VIEW COMPARISON:  None Available. FINDINGS: There is no acute fracture or dislocation. The bones are well mineralized. There is diffuse subcutaneous edema. No radiopaque foreign object or soft tissue gas. IMPRESSION: 1. No acute fracture or dislocation. 2. Diffuse subcutaneous edema. Electronically Signed   By: Elgie Collard M.D.   On: 01/08/2023 19:47   DG Chest Port 1 View  Result Date: 01/08/2023 CLINICAL DATA:  Questionable sepsis. EXAM: PORTABLE CHEST 1 VIEW COMPARISON:  Chest radiograph dated 03/14/2017. FINDINGS: No focal consolidation, pleural effusion or pneumothorax. Stable cardiac silhouette. No acute osseous pathology. IMPRESSION: No active disease. Electronically Signed   By: Elgie Collard M.D.   On: 01/08/2023 19:46    Pending Labs Unresulted Labs (From admission, onward)     Start     Ordered   01/10/23 0500  Heparin level (unfractionated)  Daily,   R      01/08/23 2015   01/09/23 0500  HIV Antibody (routine testing w rflx)  (HIV Antibody (Routine testing w reflex) panel)  Tomorrow morning,   R        01/08/23 1908   01/09/23 0500  Comprehensive metabolic panel  Tomorrow morning,   R        01/08/23 1908   01/09/23 0500  CBC  Daily,   R      01/08/23 2015   01/09/23 0200  Heparin level (unfractionated)  Once-Timed,   TIMED        01/08/23 1920    01/08/23 1909  Hemoglobin A1c  (Glycemic Control (SSI)  Q 4 Hours / Glycemic Control (SSI)  AC +/- HS)  Add-on,   AD       Comments: To assess prior glycemic control    01/08/23 1908            Vitals/Pain Today's Vitals   01/08/23 1808 01/08/23 1830 01/08/23 1930 01/08/23 2200  BP:    130/75  Pulse:   99 97  Resp:    18  Temp:    98.5 F (36.9 C)  TempSrc:    Oral  SpO2:   96% 97%  Weight: (!) 455 lb (206.4 kg)     Height: 5\' 9"  (1.753 m)     PainSc:  8       Isolation Precautions No active isolations  Medications Medications  lactated ringers infusion ( Intravenous Rate/Dose Change 01/08/23 1916)  lisinopril (ZESTRIL) tablet 10 mg (10 mg Oral Given 01/08/23 2021)  rosuvastatin (CRESTOR) tablet 5 mg (has no administration in time range)  escitalopram (LEXAPRO) tablet 10 mg (has no administration in time range)  insulin aspart (novoLOG) injection 0-5 Units ( Subcutaneous Not Given 01/08/23 2212)  insulin aspart (novoLOG) injection 0-15 Units (has no administration in time range)  acetaminophen (TYLENOL) tablet 650 mg (has no administration in time range)  Or  acetaminophen (TYLENOL) suppository 650 mg (has no administration in time range)  polyethylene glycol (MIRALAX / GLYCOLAX) packet 17 g (has no administration in time range)  ondansetron (ZOFRAN) tablet 4 mg (has no administration in time range)    Or  ondansetron (ZOFRAN) injection 4 mg (has no administration in time range)  albuterol (PROVENTIL) (2.5 MG/3ML) 0.083% nebulizer solution 2.5 mg (has no administration in time range)  hydrALAZINE (APRESOLINE) injection 5 mg (has no administration in time range)  vancomycin (VANCOREADY) IVPB 2000 mg/400 mL (2,000 mg Intravenous New Bag/Given 01/08/23 2047)  heparin ADULT infusion 100 units/mL (25000 units/274mL) (2,200 Units/hr Intravenous New Bag/Given 01/08/23 2040)  vancomycin (VANCOREADY) IVPB 1500 mg/300 mL (has no administration in time range)  ceFEPIme (MAXIPIME) 2  g in sodium chloride 0.9 % 100 mL IVPB (has no administration in time range)  morphine (PF) 4 MG/ML injection 4 mg (4 mg Intravenous Given 01/08/23 1807)  acetaminophen (TYLENOL) tablet 650 mg (650 mg Oral Given 01/08/23 1916)  ceFEPIme (MAXIPIME) 2 g in sodium chloride 0.9 % 100 mL IVPB (0 g Intravenous Stopped 01/08/23 2015)  heparin bolus via infusion 7,500 Units (7,500 Units Intravenous Bolus from Bag 01/08/23 2042)  vancomycin (VANCOREADY) IVPB 500 mg/100 mL (0 mg Intravenous Stopped 01/08/23 2114)    Mobility walks with person assist     Focused Assessments Cellulitis on RLE   R Recommendations: See Admitting Provider Note  Report given to:   Additional Notes:

## 2023-01-08 NOTE — Progress Notes (Signed)
ANTICOAGULATION CONSULT NOTE - Initial Consult  Pharmacy Consult for heparin  Indication: DVT  Allergies  Allergen Reactions   Metformin And Related     diarrhea   Xarelto [Rivaroxaban]     DROWSINESS, FATIGUE    Patient Measurements: Height: 5\' 9"  (175.3 cm) Weight: (!) 206.4 kg (455 lb) IBW/kg (Calculated) : 70.7 Heparin Dosing Weight: HEPARIN DW (KG): 123.8   Vital Signs: Temp: 100.3 F (37.9 C) (09/12 1745) Temp Source: Oral (09/12 1745) BP: 169/78 (09/12 1800) Pulse Rate: 96 (09/12 1800)  Labs: Recent Labs    01/08/23 1502  HGB 13.4  HCT 40.3  PLT 273  APTT 28  LABPROT 14.8  INR 1.1  CREATININE 1.19    Estimated Creatinine Clearance: 143 mL/min (by C-G formula based on SCr of 1.19 mg/dL).   Medical History: Past Medical History:  Diagnosis Date   Acute saddle pulmonary embolism without acute cor pulmonale (HCC) 01/30/2017   Back pain    Diabetic nephropathy (HCC) 08/22/2021   Gallstones    Hyperlipidemia    Type 2 diabetes mellitus without complication, without long-term current use of insulin (HCC) 08/11/2018   VTE (venous thromboembolism) 05/14/2017     Assessment: 41 YOM presented to [facility] ED for CC of RLE swelling. PMH past DVT not on AC PTA. Concern for DVT. Unable to obtain Doppler US to rule out DVT until tomorrow (9/13). Plan to give heparin empirically until then. Pharmacy consulted to dose heparin.   Hgb 13.4 and plt 273. No s/sx bleeding noted in chart.   Goal of Therapy:  Heparin level 0.3-0.7 units/ml Monitor platelets by anticoagulation protocol: Yes   Plan:  Give heparin bolus 7500 units IV x1  Start heparin infusion at 2200 units/hr  Will f/u heparin level in 6 hours Monitor daily heparin level and CBC Continue to monitor H&H     Jerry Caras, PharmD PGY2 Oncology Pharmacy Resident   01/08/2023 8:13 PM

## 2023-01-08 NOTE — Progress Notes (Signed)
Pharmacy Antibiotic Note  Justin Schmidt is a 42 y.o. male admitted on 01/08/2023 with cellulitis. RLE swelling. Patient with cipro PTA with no improvement. Pharmacy has been consulted for cefepime and vancomycin dosing.  Wbc elevated 13.9 and tmax 100.3.   Plan: Give vanc loading 2500 mg x1 Start vanc maintenance 1500 mg IV q12h  Start cefepime 2g IV q8h  Obtain vancomycin peak and trough levels for goal AUC 400-550  Monitor renal function, culture results, clinical status and resolution of s/sx infection Narrow abx as able and f/u with appropriate duration.    Height: 5\' 9"  (175.3 cm) Weight: (!) 206.4 kg (455 lb) IBW/kg (Calculated) : 70.7  Temp (24hrs), Avg:98.8 F (37.1 C), Min:97.5 F (36.4 C), Max:100.3 F (37.9 C)  Recent Labs  Lab 01/08/23 1502  WBC 13.9*  CREATININE 1.19  LATICACIDVEN 1.7    Estimated Creatinine Clearance: 143 mL/min (by C-G formula based on SCr of 1.19 mg/dL).    Allergies  Allergen Reactions   Metformin And Related     diarrhea   Xarelto [Rivaroxaban]     DROWSINESS, FATIGUE    Antimicrobials this admission: vancomycin 9/12 >>  cefepime 9/12 >>  ceftriaxone 9/12 x1   Dose adjustments this admission: N/A  Microbiology results: 9/12 BCx: pending 9/12 Resp panel: negative   Thank you for allowing pharmacy to be a part of this patient's care.  Jerry Caras, PharmD PGY2 Oncology Pharmacy Resident   01/08/2023 8:36 PM

## 2023-01-08 NOTE — Sepsis Progress Note (Signed)
Elink monitoring for the code sepsis protocol.  

## 2023-01-08 NOTE — ED Triage Notes (Signed)
Brought by EMS Seen for B/L LE cellulitis 3 days ago at PCP.  Today increased RIGHT foot pain, chills and unable to put weight on RIGHT foot.

## 2023-01-08 NOTE — ED Provider Notes (Signed)
[place] EMERGENCY DEPARTMENT AT [facility] Provider Note   CSN: [id number] Arrival date & time: 01/08/23  1406     History  Chief Complaint  Patient presents with   Leg Pain    Justin Schmidt is a 42 y.o. male.   Leg Pain   42 year old male presents emergency department with complaints of right lower extremity swelling and pain and redness.  Patient states that he has had pain redness and swelling for the past 4 to 5 days.  States he was seen by his primary care on the 10th and given ciprofloxacin as well as place an Radio broadcast assistant.  States that pain has worsened since onset although he is not removed the Unna boot to see how the redness/swelling appeared.  States it has been painful to walk around.  Reports fever and chills at home.  Denies any known injury to foot.  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 Authorizing Provider  acetaminophen-codeine (TYLENOL #3) 300-30 MG tablet Take 1 tablet by mouth in the morning and at bedtime. 12/22/22  Yes Martin, [name], FNP  Ascorbic Acid (VITAMIN C) 1000 MG tablet Take 1,000 mg by mouth daily.   Yes [provider]  aspirin EC 81 MG tablet Take 81 mg by mouth every other day.   Yes [provider]  Cholecalciferol (VITAMIN D) 2000 UNITS tablet Take 2,000 Units by mouth daily.   Yes [provider]  ciprofloxacin (CIPRO) 500 MG tablet Take 1 tablet (500 mg total) by mouth 2 (two) times daily. 01/06/23  Yes Daphine Deutscher, [name], FNP  diclofenac (VOLTAREN) 75 MG EC tablet Take 1 tablet (75 mg total) by mouth 2 (two) times daily. For foot pain 12/22/22  Yes Daphine Deutscher, [name], FNP  escitalopram (LEXAPRO) 10 MG tablet Take 1 tablet (10 mg total) by mouth daily. 12/22/22  Yes Martin,  [name], FNP  Flaxseed, Linseed, (FLAXSEED OIL PO) Take 1 capsule by mouth daily.   Yes [provider]  fluticasone (FLONASE) 50 MCG/ACT nasal spray SPRAY 2 SPRAYS INTO EACH NOSTRIL EVERY DAY 04/23/21  Yes Deliah Boston F, FNP  furosemide (LASIX) 20 MG tablet Take 1 tablet (20 mg total) by mouth daily. 12/22/22  Yes Daphine Deutscher, [name], FNP  GARLIC PO Take by mouth.   Yes [provider]  lisinopril (ZESTRIL) 10 MG tablet Take 1 tablet (10 mg total) by mouth daily. 12/22/22  Yes Martin, [name], FNP  Multiple Vitamin (MULTIVITAMIN WITH MINERALS) TABS Take 1 tablet by mouth daily.   Yes [provider]  rosuvastatin (CRESTOR) 5 MG tablet Take 1 tablet (5 mg total) by mouth daily. 12/22/22  Yes Martin, [name], FNP  sitaGLIPtin (JANUVIA) 50 MG tablet Take 1 tablet (50 mg total) by mouth daily. 12/22/22  Yes Martin, [name], FNP  Zinc Acetate, Oral, (ZINC ACETATE PO) Take 1 tablet by mouth daily.   Yes [provider]      Allergies    Metformin and related and Xarelto [rivaroxaban]    Review of Systems   Review of Systems  All other systems reviewed and are negative.   Physical Exam Updated Vital Signs BP (!) 169/78   Pulse 96   Temp 100.3 F (37.9 C) (Oral)   Resp (!) 21   Ht 5\' 9"  (1.753 m)   Wt (!) 206.4 kg   SpO2  99%   BMI 67.19 kg/m  Physical Exam Vitals and nursing note reviewed.  Constitutional:      General: He is not in acute distress.    Appearance: He is well-developed.  HENT:     Head: Normocephalic and atraumatic.  Eyes:     Conjunctiva/sclera: Conjunctivae normal.  Cardiovascular:     Rate and Rhythm: Normal rate and regular rhythm.     Heart sounds: No murmur heard. Pulmonary:     Effort: Pulmonary effort is normal. No respiratory distress.     Breath sounds: Normal breath sounds.  Abdominal:     Palpations: Abdomen is soft.     Tenderness: There is no abdominal tenderness.  Musculoskeletal:         General: Swelling present.     Cervical back: Neck supple.     Comments: Patient with erythema noted on right lower extremity with palpable warmth/tenderness to palpation..  See media below.  Pedal pulses 2+ bilaterally.  Mild amount of serous drainage on the medial aspect of right lower extremity.  Venous stasis changes to bilateral lower extremities.  Skin:    General: Skin is warm and dry.     Capillary Refill: Capillary refill takes less than 2 seconds.  Neurological:     Mental Status: He is alert.  Psychiatric:        Mood and Affect: Mood normal.     ED Results / Procedures / Treatments   Labs (all labs ordered are listed, but only abnormal results are displayed) Labs Reviewed  CBC WITH DIFFERENTIAL/PLATELET - Abnormal; Notable for the following components:      Result Value   WBC 13.9 (*)    Neutro Abs 11.1 (*)    Monocytes Absolute 1.5 (*)    All other components within normal limits  COMPREHENSIVE METABOLIC PANEL - Abnormal; Notable for the following components:   Glucose, Bld 145 (*)    Calcium 8.7 (*)    All other components within normal limits  CULTURE, BLOOD (ROUTINE X 2)  CULTURE, BLOOD (ROUTINE X 2)  RESP PANEL BY RT-PCR (RSV, FLU A&B, COVID)  RVPGX2  LACTIC ACID, PLASMA  PROTIME-INR  APTT  URINALYSIS, ROUTINE W REFLEX MICROSCOPIC    EKG None  Radiology No results found.  Procedures .Critical Care  Performed by: Peter Garter, PA Authorized by: Peter Garter, PA   Critical care provider statement:    Critical care time (minutes):  47   Critical care was necessary to treat or prevent imminent or life-threatening deterioration of the following conditions:  Sepsis   Critical care was time spent personally by me on the following activities:  Development of treatment plan with patient or surrogate, discussions with consultants, evaluation of patient's response to treatment, examination of patient, ordering and review of laboratory studies,  ordering and review of radiographic studies, ordering and performing treatments and interventions, pulse oximetry, re-evaluation of patient's condition and review of old charts   I assumed direction of critical care for this patient from another provider in my specialty: no     Care discussed with: admitting provider       Medications Ordered in ED Medications  lactated ringers infusion ( Intravenous New Bag/Given 01/08/23 1737)  cefTRIAXone (ROCEPHIN) 2 g in sodium chloride 0.9 % 100 mL IVPB (2 g Intravenous New Bag/Given 01/08/23 1736)  acetaminophen (TYLENOL) tablet 650 mg (has no administration in time range)  morphine (PF) 4 MG/ML injection 4 mg (4 mg Intravenous Given 01/08/23 1807)  ED Course/ Medical Decision Making/ A&P Clinical Course as of 01/08/23 1833  Thu Jan 08, 2023  1829 Consulted Dr. Randol Kern who agreed with admission and assume further treatment/care [CR]    Clinical Course User Index [CR] Peter Garter, PA                                 Medical Decision Making Amount and/or Complexity of Data Reviewed Labs: ordered. Radiology: ordered. ECG/medicine tests: ordered.  Risk OTC drugs. Prescription drug management. Decision regarding hospitalization.   This patient presents to the ED for concern of leg pain/swelling/redness, this involves an extensive number of treatment options, and is a complaint that carries with it a high risk of complications and morbidity.  The differential diagnosis includes sepsis, cellulitis, ischemic limb, DVT, fracture, dislocation, necrotizing infection   Co morbidities that complicate the patient evaluation  See HPI   Additional history obtained:  Additional history obtained from EMR External records from outside source obtained and reviewed including hospital records   Lab Tests:  I Ordered, and personally interpreted labs.  The pertinent results include: Leukocytosis of 13.9 with left shift.  No evidence of  anemia.  Placed within range.  Mild hypocalcemia of 8.7 otherwise, electrolytes is normal limits.  No transaminitis.  No renal dysfunction.  Blood cultures pending.  PT/INR and APTT within normal limits.  Lactic acid 1.7   Imaging Studies ordered:  I ordered imaging studies including chest x-ray, right tibia/fibula x-ray I independently visualized and interpreted imaging which showed  Chest x-ray: Pending Right tibia/fibula x-ray: Pending I agree with the radiologist interpretation   Cardiac Monitoring: / EKG:  The patient was maintained on a cardiac monitor.  I personally viewed and interpreted the cardiac monitored which showed an underlying rhythm of: Sinus rhythm with T wave changes   Consultations Obtained:  See ED course  Problem List / ED Course / Critical interventions / Medication management  Sepsis, cellulitis I ordered medication including lactated Ringer's, Rocephin   Reevaluation of the patient after these medicines showed that the patient improved I have reviewed the patients home medicines and have made adjustments as needed   Social Determinants of Health:  Denies tobacco, illicit drug use   Test / Admission - Considered:  Sepsis, cellulitis Vitals signs significant for hypertension blood pressure 160/71, tachycardia with heart rate greater than 100. Otherwise within normal range and stable throughout visit. Laboratory/imaging studies significant for: See above 42 year old male presents emergency department with complaints of right lower extremity pain/swelling/redness over the past 4 to 5 days.  On initial exam, patient with erythematous, tender, warm right lower extremity as well as generally tactile warmth to the touch.  Initial vital signs include tachycardia with a heart rate in the 100s without fever initially.  Asked nursing staff to repeat temp and with oral temp 100.3.  Patient also with leukocytosis of 13.9.  Given vital signs as well as leukocytosis,  meeting of SIRS criteria with presumed failure of outpatient antibiotics given 2+ days without improvement.  Sepsis protocol performed with broad-spectrum antibiotics.  Will call for admission. Treatment plan were discussed at length with patient and they knowledge understanding was agreeable to said plan.  Appropriate consultations were made as described in the ED course.  Patient was stable upon admission to the hospital.         Final Clinical Impression(s) / ED Diagnoses Final diagnoses:  Cellulitis of right lower  extremity  Sepsis without acute organ dysfunction, due to unspecified organism [facility])    Rx / DC Orders ED Discharge Orders     None         Peter Garter, [place] 01/08/23 2440    Bethann Berkshire, MD 01/09/23 1226

## 2023-01-08 NOTE — H&P (Signed)
[facility] H&P   Patient Demographics:    Justin Schmidt, is a 42 y.o. male  MRN: [id number]   DOB - 01/08/1981  Admit Date - 01/08/2023  Outpatient Primary MD for the patient is Bennie Pierini, FNP  Referring MD/NP/PA: PA Cooper    Patient coming from: home  Chief Complaint  Patient presents with   Leg Pain      HPI:    Justin Schmidt  is a 42 y.o. male, with morbid obesity, hypertension, hyperlipidemia, diabetes mellitus type 2, DVT/VTE in the past, not anymore on anticoagulation. -Patient presents to ED secondary to complaints of right lower extremity swelling, pain and redness, patient reports it is ongoing for last 5 days, he was given Cipro by his PCP 2 days ago, and had Unna boots, reports pain has worsened, it is pretty painful when he walks around, he has not been pretty active over the last few days, as well he reports fever and chills at home, denies any injury or trauma to foot. -In ED he was febrile 100.3, had leukocytosis at 13.9, tach acid was reassuring at 1.7, [facility] hospitalist consulted to admit given black improvement on oral Cipro, and severe cellulitis with history of morbid obesity and diabetes mellitus.    Review of systems:   A full 10 point Review of Systems was done, except as stated above, all other Review of Systems were negative.   With Past History of the following :    Past Medical History:  Diagnosis Date   Acute saddle pulmonary embolism without acute cor pulmonale (HCC) 01/30/2017   Back pain    Diabetic nephropathy (HCC) 08/22/2021   Gallstones    Hyperlipidemia    Type 2 diabetes mellitus without complication, without long-term current use of insulin (HCC) 08/11/2018   VTE (venous thromboembolism) 05/14/2017      Past Surgical History:  Procedure Laterality Date   APPENDECTOMY  1991   CHOLECYSTECTOMY  01/23/2012   Procedure: LAPAROSCOPIC  CHOLECYSTECTOMY;  Surgeon: Fabio Bering, MD;  Location: [facility];  Service: General;  Laterality: N/A;   TONSILLECTOMY        Social History:     Social History   Tobacco Use   Smoking status: Never   Smokeless tobacco: Never  Substance Use Topics   Alcohol use: Yes    Alcohol/week: 1.0 standard drink of alcohol    Types: 1 Cans of beer per week    Comment: very rare        Family History :     Family History  Problem Relation Age of Onset   Diabetes Mother    Diabetes Father    Hypertension Father    Hyperlipidemia Father    Migraines Sister    Diabetes Maternal Uncle    Diabetes Maternal Grandmother    Diabetes Other     Home Medications:   Prior to Admission medications  Medication Sig Start Date End Date Taking? Authorizing Provider  acetaminophen-codeine (TYLENOL #3) 300-30 MG tablet Take 1 tablet by mouth in the morning and at bedtime. 12/22/22  Yes Martin, [name], FNP  Ascorbic Acid (VITAMIN C) 1000 MG tablet Take 1,000 mg by mouth daily.   Yes [provider]  aspirin EC 81 MG tablet Take 81 mg by mouth every other day.   Yes [provider]  Cholecalciferol (VITAMIN D) 2000 UNITS tablet Take 2,000 Units by mouth daily.   Yes [provider]  ciprofloxacin (CIPRO) 500 MG tablet Take 1 tablet (500 mg total) by mouth 2 (two) times daily. 01/06/23  Yes Daphine Deutscher, [name], FNP  diclofenac (VOLTAREN) 75 MG EC tablet Take 1 tablet (75 mg total) by mouth 2 (two) times daily. For foot pain 12/22/22  Yes Daphine Deutscher, [name], FNP  escitalopram (LEXAPRO) 10 MG tablet Take 1 tablet (10 mg total) by mouth daily. 12/22/22  Yes Martin, [name], FNP  Flaxseed, Linseed, (FLAXSEED OIL PO) Take 1 capsule by mouth daily.   Yes [provider]  fluticasone (FLONASE) 50 MCG/ACT nasal spray SPRAY 2 SPRAYS INTO EACH NOSTRIL EVERY DAY 04/23/21  Yes Deliah Boston F, FNP  furosemide (LASIX) 20 MG tablet Take 1 tablet (20 mg total) by  mouth daily. 12/22/22  Yes Daphine Deutscher, [name], FNP  GARLIC PO Take by mouth.   Yes [provider]  lisinopril (ZESTRIL) 10 MG tablet Take 1 tablet (10 mg total) by mouth daily. 12/22/22  Yes Martin, [name], FNP  Multiple Vitamin (MULTIVITAMIN WITH MINERALS) TABS Take 1 tablet by mouth daily.   Yes [provider]  rosuvastatin (CRESTOR) 5 MG tablet Take 1 tablet (5 mg total) by mouth daily. 12/22/22  Yes Martin, [name], FNP  sitaGLIPtin (JANUVIA) 50 MG tablet Take 1 tablet (50 mg total) by mouth daily. 12/22/22  Yes Martin, [name], FNP  Zinc Acetate, Oral, (ZINC ACETATE PO) Take 1 tablet by mouth daily.   Yes [provider]     Allergies:     Allergies  Allergen Reactions   Metformin And Related     diarrhea   Xarelto [Rivaroxaban]     DROWSINESS, FATIGUE     Physical Exam:   Vitals  Blood pressure (!) 169/78, pulse 96, temperature 100.3 F (37.9 C), temperature source Oral, resp. rate (!) 21, height 5\' 9"  (1.753 m), weight (!) 206.4 kg, SpO2 99%.   1. General Morbidly obese male, laying in bed, in no apparent distress  2. Normal affect and insight, Not Suicidal or Homicidal, Awake Alert, Oriented X 3.  3. No F.N deficits, ALL C.Nerves Intact, Strength 5/5 all 4 extremities, Sensation intact all 4 extremities, Plantars down going.  4. Ears and Eyes appear Normal, Conjunctivae clear, PERRLA. Moist Oral Mucosa.  5. Supple Neck, No JVD, No cervical lymphadenopathy appriciated, No Carotid Bruits.  6.  Distant respiratory sound due to body habitus, symmetrical Chest Puertas movement, Good air movement bilaterally, CTAB.  7. RRR, No Gallops, Rubs or Murmurs, No Parasternal Heave.  8. Positive Bowel Sounds, Abdomen Soft, No tenderness, No organomegaly appriciated,No rebound -guarding or rigidity.  9.  No Cyanosis, Normal Skin Turgor, right lower extremity with significant erythema, significantly tender to palpation, with warmth,  please see pictures below  10. Good muscle tone,  joints appear normal        Data Review:    CBC Recent Labs  Lab 01/08/23 1502  WBC 13.9*  HGB 13.4  HCT 40.3  PLT 273  MCV 93.9  MCH 31.2  MCHC 33.3  RDW 13.5  LYMPHSABS 1.2  MONOABS 1.5*  EOSABS 0.0  BASOSABS 0.1   ------------------------------------------------------------------------------------------------------------------  Chemistries  Recent Labs  Lab 01/08/23 1502  NA 135  K 3.6  CL 98  CO2 26  GLUCOSE 145*  BUN 15  CREATININE 1.19  CALCIUM 8.7*  AST 17  ALT 32  ALKPHOS 51  BILITOT 1.2   ------------------------------------------------------------------------------------------------------------------ estimated creatinine clearance is 143 mL/min (by C-G formula based on SCr of 1.19 mg/dL). ------------------------------------------------------------------------------------------------------------------ No results for input(s): "TSH", "T4TOTAL", "T3FREE", "THYROIDAB" in the last 72 hours.  Invalid input(s): "FREET3"  Coagulation profile Recent Labs  Lab 01/08/23 1502  INR 1.1   ------------------------------------------------------------------------------------------------------------------- No results for input(s): "DDIMER" in the last 72 hours. -------------------------------------------------------------------------------------------------------------------  Cardiac Enzymes No results for input(s): "CKMB", "TROPONINI", "MYOGLOBIN" in the last 168 hours.  Invalid input(s): "CK" ------------------------------------------------------------------------------------------------------------------ No results found for: "BNP"   ---------------------------------------------------------------------------------------------------------------  Urinalysis    Component Value Date/Time   COLORURINE YELLOW 01/08/2023 1742   APPEARANCEUR CLEAR 01/08/2023 1742   APPEARANCEUR Clear 03/26/2021 1533    LABSPEC 1.012 01/08/2023 1742   PHURINE 6.0 01/08/2023 1742   GLUCOSEU NEGATIVE 01/08/2023 1742   HGBUR NEGATIVE 01/08/2023 1742   BILIRUBINUR NEGATIVE 01/08/2023 1742   BILIRUBINUR Negative 03/26/2021 1533   KETONESUR 5 (A) 01/08/2023 1742   PROTEINUR NEGATIVE 01/08/2023 1742   UROBILINOGEN 0.2 01/21/2012 2342   NITRITE NEGATIVE 01/08/2023 1742   LEUKOCYTESUR NEGATIVE 01/08/2023 1742    ----------------------------------------------------------------------------------------------------------------   Imaging Results:    No results found.  EKG:  Vent. rate 96 BPM PR interval 166 ms QRS duration 96 ms QT/QTcB 358/453 ms P-R-T axes 53 64 61 Sinus rhythm Borderline T wave abnormalities  Assessment & Plan:    Principal Problem:   Cellulitis Active Problems:   Hyperlipidemia associated with type 2 diabetes mellitus (HCC)   Fatty liver   Depression, recurrent (HCC)   Morbid obesity with BMI of 60.0-69.9, adult (HCC)   Type 2 diabetes mellitus without complication, without long-term current use of insulin (HCC)   History of pulmonary embolus (PE)   History of DVT (deep vein thrombosis)   Essential hypertension   Cellulitis -Patient presents with fever, leukocytosis, significant right lower extremity edema, pain and warmth, he is clinically with severe cellulitis, no improvement with oral ciprofloxacin. -Follow on blood cultures -Will broaden his antibiotic coverage to vancomycin and cefepime given severe cellulitis, and he is high risk in the setting of morbid obesity and diabetes mellitus  History of PE and DVT High clinical concern for VTE -Was 5 years ago, patient reports he has been nonambulatory for last few days, he has positive Homans signs, high clinical concern for DVT, will not be able to obtain venous Doppler till tomorrow, so we will start empirically on heparin GTT for anticoagulation pending venous Dopplers, I have discussed this with the patient, discussed  risks and benefits. -Will hold aspirin while he is on heparin gtt.  Hypertension -Blood pressure is elevated, resume home medication and add as needed hydralazine  Type 2 diabetes-on Januvia, will hold, will keep on insulin sliding scale, will check A1c  Hyperlipidemia-continue with Crestor  Morbid obesity Fatty liver -BMI of 67, I have discussed at length with the patient, to follow with the weight loss clinic as he he would be a good candidate for GLP inhibitors versus surgical weight loss intervention intervention       DVT Prophylaxis Heparin gtt. empirically tell DVT is ruled  out  AM Labs Ordered, also please review Full Orders  Family Communication: Admission, patients condition and plan of care including tests being ordered have been discussed with the patient  who indicate understanding and agree with the plan and Code Status.  Code Status full code  Likely DC to home  Condition GUARDED  Consults called: None  Admission status: Inpatient  Time spent in minutes : 70 minutes   Huey Bienenstock M.D on 01/08/2023 at 7:09 PM   [facility] - Office  [phone]

## 2023-01-08 NOTE — Sepsis Progress Note (Signed)
Blood cultures drawn before antibiotics hung 

## 2023-01-08 NOTE — ED Notes (Signed)
Please note that second blood culture was drawn at 1730 before any antibiotic was given.

## 2023-01-08 NOTE — ED Notes (Signed)
Admitting MD at the bedside.  

## 2023-01-08 NOTE — ED Notes (Signed)
Pt was given urinal, advised him that urine sample is needed

## 2023-01-09 ENCOUNTER — Inpatient Hospital Stay (HOSPITAL_COMMUNITY): Payer: 59

## 2023-01-09 ENCOUNTER — Ambulatory Visit: Payer: 59 | Admitting: Nurse Practitioner

## 2023-01-09 DIAGNOSIS — L03115 Cellulitis of right lower limb: Secondary | ICD-10-CM | POA: Diagnosis not present

## 2023-01-09 LAB — HIV ANTIBODY (ROUTINE TESTING W REFLEX): HIV Screen 4th Generation wRfx: NONREACTIVE

## 2023-01-09 LAB — GLUCOSE, CAPILLARY
Glucose-Capillary: 133 mg/dL — ABNORMAL HIGH (ref 70–99)
Glucose-Capillary: 147 mg/dL — ABNORMAL HIGH (ref 70–99)
Glucose-Capillary: 110 mg/dL — ABNORMAL HIGH (ref 70–99)
Glucose-Capillary: 140 mg/dL — ABNORMAL HIGH (ref 70–99)

## 2023-01-09 LAB — COMPREHENSIVE METABOLIC PANEL
ALT: 28 U/L (ref 0–44)
AST: 16 U/L (ref 15–41)
Albumin: 3.2 g/dL — ABNORMAL LOW (ref 3.5–5.0)
Alkaline Phosphatase: 45 U/L (ref 38–126)
Anion gap: 11 (ref 5–15)
BUN: 14 mg/dL (ref 6–20)
CO2: 24 mmol/L (ref 22–32)
Calcium: 8.2 mg/dL — ABNORMAL LOW (ref 8.9–10.3)
Chloride: 98 mmol/L (ref 98–111)
Creatinine, Ser: 1.18 mg/dL (ref 0.61–1.24)
GFR, Estimated: >60 mL/min (ref >60)
Glucose, Bld: 192 mg/dL — ABNORMAL HIGH (ref 70–99)
Potassium: 3.3 mmol/L — ABNORMAL LOW (ref 3.5–5.1)
Sodium: 133 mmol/L — ABNORMAL LOW (ref 135–145)
Total Bilirubin: 0.9 mg/dL (ref 0.3–1.2)
Total Protein: 7.3 g/dL (ref 6.5–8.1)

## 2023-01-09 LAB — HEPARIN LEVEL (UNFRACTIONATED)
Heparin Unfractionated: <0.1 [IU]/mL — ABNORMAL LOW (ref 0.30–0.70)
Heparin Unfractionated: 0.14 [IU]/mL — ABNORMAL LOW (ref 0.30–0.70)

## 2023-01-09 LAB — CBC
HCT: 36.7 % — ABNORMAL LOW (ref 39.0–52.0)
Hemoglobin: 12.3 g/dL — ABNORMAL LOW (ref 13.0–17.0)
MCH: 31.4 pg (ref 26.0–34.0)
MCHC: 33.5 g/dL (ref 30.0–36.0)
MCV: 93.6 fL (ref 80.0–100.0)
Platelets: 263 10*3/uL (ref 150–400)
RBC: 3.92 MIL/uL — ABNORMAL LOW (ref 4.22–5.81)
RDW: 13.6 % (ref 11.5–15.5)
WBC: 11.5 10*3/uL — ABNORMAL HIGH (ref 4.0–10.5)
nRBC: 0 % (ref 0.0–0.2)

## 2023-01-09 MED ORDER — POTASSIUM CHLORIDE CRYS ER 20 MEQ PO TBCR
40.0000 meq | EXTENDED_RELEASE_TABLET | Freq: Once | ORAL | Status: AC
  Administered 2023-01-09: 40 meq via ORAL
  Filled 2023-01-09: qty 2

## 2023-01-09 MED ORDER — HYDROMORPHONE HCL 1 MG/ML IJ SOLN: 1.0000 mg | INTRAMUSCULAR | Status: DC | PRN

## 2023-01-09 MED ORDER — HEPARIN BOLUS VIA INFUSION
3700.0000 [IU] | Freq: Once | INTRAVENOUS | Status: AC
  Administered 2023-01-09: 3700 [IU] via INTRAVENOUS
  Filled 2023-01-09: qty 3700

## 2023-01-09 MED ORDER — KETOROLAC TROMETHAMINE 30 MG/ML IJ SOLN
30.0000 mg | Freq: Once | INTRAMUSCULAR | Status: AC
  Administered 2023-01-09: 30 mg via INTRAVENOUS
  Filled 2023-01-09: qty 1

## 2023-01-09 MED ORDER — RISAQUAD PO CAPS
2.0000 | ORAL_CAPSULE | Freq: Three times a day (TID) | ORAL | Status: DC
  Administered 2023-01-09 – 2023-01-12 (×9): 2 via ORAL
  Filled 2023-01-09 (×9): qty 2

## 2023-01-09 MED ORDER — OXYCODONE HCL 5 MG PO TABS
10.0000 mg | ORAL_TABLET | Freq: Four times a day (QID) | ORAL | Status: DC | PRN
  Administered 2023-01-09 – 2023-01-12 (×8): 10 mg via ORAL
  Filled 2023-01-09 (×9): qty 2

## 2023-01-09 MED ORDER — ENOXAPARIN SODIUM 120 MG/0.8ML IJ SOSY
110.0000 mg | PREFILLED_SYRINGE | INTRAMUSCULAR | Status: DC
  Administered 2023-01-09 – 2023-01-11 (×3): 110 mg via SUBCUTANEOUS
  Filled 2023-01-09 (×3): qty 0.8

## 2023-01-09 NOTE — [place] FL2 (Signed)
[place] MEDICAID FL2 LEVEL OF CARE FORM     IDENTIFICATION  Patient Name: Justin Schmidt Birthdate: 09-Nov-1980 Sex: male Admission Date (Current Location): 01/08/2023  [facility] and [place] Number:  [employer] and Address:  [facility],  [address] [place] [address]      Provider Number: [id number]  Attending Physician Name and Address:  Kendell Bane, MD  Relative Name and Phone Number:  Jandel, Eckman (Father)  [phone]    Current Level of Care: Hospital Recommended Level of Care: Skilled Nursing Facility Prior Approval Number:    Date Approved/Denied:   PASRR Number: [id number]  Discharge Plan: SNF    Current Diagnoses: Patient Active Problem List   Diagnosis Date Noted   Cellulitis 01/08/2023   Essential hypertension 01/08/2023   Chronic pain of left knee 07/10/2020   History of pulmonary embolus (PE) 11/08/2018   History of DVT (deep vein thrombosis) 11/08/2018   Type 2 diabetes mellitus without complication, without long-term current use of insulin (HCC) 08/11/2018   Morbid obesity with BMI of 60.0-69.9, adult (HCC) 01/30/2017   Controlled substance agreement signed 09/11/2016   Chronic back pain 09/11/2016   Depression, recurrent (HCC) 09/11/2016   Fatty liver 05/17/2015   Hyperlipidemia associated with type 2 diabetes mellitus (HCC) 09/05/2013    Orientation RESPIRATION BLADDER Height & Weight     Self, Time, Situation, Place  Normal Continent Weight: (!) 206.4 kg Height:  5\' 9"  (175.3 cm)  BEHAVIORAL SYMPTOMS/MOOD NEUROLOGICAL BOWEL NUTRITION STATUS      Continent Diet (See DC summary)  AMBULATORY STATUS COMMUNICATION OF NEEDS Skin   Extensive Assist Verbally Other (Comment) (Cellulitis)                       Personal Care Assistance Level of Assistance  Bathing, Feeding, Dressing Bathing Assistance: Maximum assistance Feeding assistance: Independent Dressing Assistance: Limited assistance     Functional  Limitations Info  Sight, Hearing, Speech Sight Info: Adequate Hearing Info: Adequate Speech Info: Adequate    SPECIAL CARE FACTORS FREQUENCY  PT (By licensed PT)     PT Frequency: 5 Times a week              Contractures Contractures Info: Not present    Additional Factors Info  Code Status, Allergies Code Status Info: Full Allergies Info: Metformin, Xarelto           Current Medications (01/09/2023):  This is the current hospital active medication list Current Facility-Administered Medications  Medication Dose Route Frequency Provider Last Rate Last Admin   acetaminophen (TYLENOL) tablet 650 mg  650 mg Oral Q6H PRN Elgergawy, Leana Roe, MD   650 mg at 01/09/23 1143   Or   acetaminophen (TYLENOL) suppository 650 mg  650 mg Rectal Q6H PRN Elgergawy, Leana Roe, MD       acidophilus (RISAQUAD) capsule 2 capsule  2 capsule Oral TID Shahmehdi, Seyed A, MD       albuterol (PROVENTIL) (2.5 MG/3ML) 0.083% nebulizer solution 2.5 mg  2.5 mg Nebulization Q2H PRN Elgergawy, Leana Roe, MD       ceFEPIme (MAXIPIME) 2 g in sodium chloride 0.9 % 100 mL IVPB  2 g Intravenous Q8H Roskowski, Stephanie, RPH 200 mL/hr at 01/09/23 0819 2 g at 01/09/23 0819   enoxaparin (LOVENOX) injection 110 mg  110 mg Subcutaneous Q24H Shahmehdi, Seyed A, MD       escitalopram (LEXAPRO) tablet 10 mg  10 mg Oral Daily Elgergawy,  Leana Roe, MD   10 mg at 01/09/23 0815   hydrALAZINE (APRESOLINE) injection 5 mg  5 mg Intravenous Q4H PRN Elgergawy, Leana Roe, MD       HYDROmorphone (DILAUDID) injection 1 mg  1 mg Intravenous Q4H PRN Shahmehdi, Seyed A, MD       insulin aspart (novoLOG) injection 0-15 Units  0-15 Units Subcutaneous TID WC Elgergawy, Leana Roe, MD   2 Units at 01/09/23 1234   insulin aspart (novoLOG) injection 0-5 Units  0-5 Units Subcutaneous QHS Elgergawy, Leana Roe, MD       lisinopril (ZESTRIL) tablet 10 mg  10 mg Oral Daily Elgergawy, Leana Roe, MD   10 mg at 01/09/23 0824   ondansetron (ZOFRAN) tablet  4 mg  4 mg Oral Q6H PRN Elgergawy, Leana Roe, MD       Or   ondansetron (ZOFRAN) injection 4 mg  4 mg Intravenous Q6H PRN Elgergawy, Leana Roe, MD       oxyCODONE (Oxy IR/ROXICODONE) immediate release tablet 10 mg  10 mg Oral Q6H PRN Shahmehdi, Seyed A, MD       polyethylene glycol (MIRALAX / GLYCOLAX) packet 17 g  17 g Oral Daily PRN Elgergawy, Leana Roe, MD       rosuvastatin (CRESTOR) tablet 5 mg  5 mg Oral Daily Elgergawy, Leana Roe, MD   5 mg at 01/09/23 0815   vancomycin (VANCOREADY) IVPB 1500 mg/300 mL  1,500 mg Intravenous Q12H Jerry Caras, RPH 150 mL/hr at 01/09/23 0856 1,500 mg at 01/09/23 0856   Facility-Administered Medications Ordered in Other Encounters  Medication Dose Route Frequency Provider Last Rate Last Admin   sodium chloride irrigation 0.9 %    PRN Tilford Pillar, MD   3,000 mL at 01/27/12 6045     Discharge Medications: Please see discharge summary for a list of discharge medications.  Relevant Imaging Results:  Relevant Lab Results:   Additional Information SS# [id number]  Leitha Bleak, RN

## 2023-01-09 NOTE — Evaluation (Signed)
Physical Therapy Evaluation Patient Details Name: Justin Schmidt MRN: [id number] DOB: 06-03-1980 Today's Date: 01/09/2023  History of Present Illness  Justin Schmidt  is a 42 y.o. male, with morbid obesity, hypertension, hyperlipidemia, diabetes mellitus type 2, DVT/VTE in the past, not anymore on anticoagulation.  -Patient presents to ED secondary to complaints of right lower extremity swelling, pain and redness, patient reports it is ongoing for last 5 days, he was given Cipro by his PCP 2 days ago, and had Unna boots, reports pain has worsened, it is pretty painful when he walks around, he has not been pretty active over the last few days, as well he reports fever and chills at home, denies any injury or trauma to foot.  -In ED he was febrile 100.3, had leukocytosis at 13.9, tach acid was reassuring at 1.7, [facility] hospitalist consulted to admit given black improvement on oral Cipro, and severe cellulitis with history of morbid obesity and diabetes mellitus.   Clinical Impression  Patient demonstrates labored movement for rolling to side and sitting up from side lying position, unsteady labored movement during sit to stands and limited to a few hopping steps on LLE due to poor tolerance for weightbearing on RLE due to increasing pain using RW.  Patient tolerated sitting up in chair after therapy.  Patient will benefit from continued skilled physical therapy in hospital and recommended venue below to increase strength, balance, endurance for safe ADLs and gait.           If plan is discharge home, recommend the following: A lot of help with walking and/or transfers;A lot of help with bathing/dressing/bathroom;Help with stairs or ramp for entrance;Assistance with cooking/housework   Can travel by private vehicle   No    Equipment Recommendations None recommended by PT  Recommendations for Other Services       Functional Status Assessment Patient has had a recent decline in their functional status  and demonstrates the ability to make significant improvements in function in a reasonable and predictable amount of time.     Precautions / Restrictions Precautions Precautions: Fall Restrictions Weight Bearing Restrictions: No      Mobility  Bed Mobility Overal bed mobility: Needs Assistance Bed Mobility: Supine to Sit     Supine to sit: Supervision, Contact guard     General bed mobility comments: increased time, labored movemet with HOB flat    Transfers Overall transfer level: Needs assistance Equipment used: Rolling walker (2 wheels) Transfers: Sit to/from Stand, Bed to chair/wheelchair/BSC Sit to Stand: Contact guard assist   Step pivot transfers: Min assist, Mod assist       General transfer comment: unsteady labored movement with poor tolerance for weightbearing on RLE    Ambulation/Gait Ambulation/Gait assistance: Mod assist Gait Distance (Feet): 4 Feet Assistive device: Rolling walker (2 wheels) Gait Pattern/deviations: Decreased step length - right, Decreased step length - left, Decreased stance time - right, Decreased stride length, Antalgic Gait velocity: slow     General Gait Details: limted to a few hopping steps on LLE due to unable to tolerate weightbearing on RLE secondary to pain  Stairs            Wheelchair Mobility     Tilt Bed    Modified Rankin (Stroke Patients Only)       Balance Overall balance assessment: Needs assistance Sitting-balance support: Feet supported, No upper extremity supported Sitting balance-Leahy Scale: Fair Sitting balance - Comments: fair/good seated at EOB   Standing balance support: Reliant  on assistive device for balance, During functional activity, Bilateral upper extremity supported Standing balance-Leahy Scale: Poor Standing balance comment: fair/poor using RW with mostly NWB on RLE                             Pertinent Vitals/Pain Pain Assessment Pain Assessment: 0-10 Pain  Score: 8  Pain Location: right foot Pain Descriptors / Indicators: Sore, Grimacing, Guarding, Sharp Pain Intervention(s): Limited activity within patient's tolerance, Repositioned, Monitored during session    Home Living Family/patient expects to be discharged to:: Private residence Living Arrangements: Non-relatives/Friends Available Help at Discharge: Friend(s);Available PRN/intermittently Type of Home: House Home Access: Stairs to enter Entrance Stairs-Rails: Right;Left;Can reach both Entrance Stairs-Number of Steps: 3   Home Layout: One level Home Equipment: Agricultural consultant (2 wheels);Cane - single point;Grab bars - tub/shower;Shower seat      Prior Function Prior Level of Function : Independent/Modified Independent;Driving             Mobility Comments: Tourist information centre manager without AD, drives, shops ADLs Comments: Independent     Extremity/Trunk Assessment   Upper Extremity Assessment Upper Extremity Assessment: Defer to OT evaluation    Lower Extremity Assessment Lower Extremity Assessment: Generalized weakness;RLE deficits/detail RLE Deficits / Details: grossly -3/5 RLE: Unable to fully assess due to pain RLE Sensation: WNL RLE Coordination: WNL    Cervical / Trunk Assessment Cervical / Trunk Assessment: Normal  Communication   Communication Communication: No apparent difficulties  Cognition Arousal: Alert Behavior During Therapy: WFL for tasks assessed/performed Overall Cognitive Status: Within Functional Limits for tasks assessed                                          General Comments      Exercises     Assessment/Plan    PT Assessment Patient needs continued PT services  PT Problem List Decreased strength;Decreased activity tolerance;Decreased balance;Decreased mobility;Pain       PT Treatment Interventions DME instruction;Gait training;Stair training;Functional mobility training;Therapeutic activities;Therapeutic  exercise;Balance training;Patient/family education    PT Goals (Current goals can be found in the Care Plan section)  Acute Rehab PT Goals Patient Stated Goal: return home with friends to assist PT Goal Formulation: With patient Time For Goal Achievement: 01/23/23 Potential to Achieve Goals: Good    Frequency Min 3X/week     Co-evaluation PT/OT/SLP Co-Evaluation/Treatment: Yes Reason for Co-Treatment: To address functional/ADL transfers PT goals addressed during session: Mobility/safety with mobility;Balance;Proper use of DME         AM-PAC PT "6 Clicks" Mobility  Outcome Measure Help needed turning from your back to your side while in a flat bed without using bedrails?: A Little Help needed moving from lying on your back to sitting on the side of a flat bed without using bedrails?: A Little Help needed moving to and from a bed to a chair (including a wheelchair)?: A Lot Help needed standing up from a chair using your arms (e.g., wheelchair or bedside chair)?: A Lot Help needed to walk in hospital room?: A Lot Help needed climbing 3-5 steps with a railing? : Total 6 Click Score: 13    End of Session   Activity Tolerance: Patient tolerated treatment well;Patient limited by fatigue;Patient limited by pain Patient left: in chair;with call bell/phone within reach Nurse Communication: Mobility status PT Visit Diagnosis: Unsteadiness on feet (R26.81);Other  abnormalities of gait and mobility (R26.89);Muscle weakness (generalized) (M62.81)    Time: 0981-1914 PT Time Calculation (min) (ACUTE ONLY): 25 min   Charges:   PT Evaluation $PT Eval Moderate Complexity: 1 Mod PT Treatments $Therapeutic Activity: 23-37 mins PT General Charges $$ ACUTE PT VISIT: 1 Visit         11:24 AM, 01/09/23 Ocie Bob, MPT Physical Therapist with [facility] [phone] office [phone] mobile phone

## 2023-01-09 NOTE — Progress Notes (Signed)
ANTICOAGULATION CONSULT NOTE - Follow Up  Pharmacy Consult for heparin =>lovenox DVT px Indication: DVT => DVT px  Allergies  Allergen Reactions   Metformin And Related     diarrhea   Xarelto [Rivaroxaban]     DROWSINESS, FATIGUE    Patient Measurements: Height: 5\' 9"  (175.3 cm) Weight: (!) 206.4 kg (455 lb) IBW/kg (Calculated) : 70.7  HEPARIN DW (KG): 123.8   Vital Signs: Temp: 99.4 F (37.4 C) (09/13 0824) Temp Source: Oral (09/13 0824) BP: 152/75 (09/13 0824) Pulse Rate: 96 (09/13 0824)  Labs: Recent Labs    01/08/23 1502 01/09/23 0204 01/09/23 0909  HGB 13.4 12.3*  --   HCT 40.3 36.7*  --   PLT 273 263  --   APTT 28  --   --   LABPROT 14.8  --   --   INR 1.1  --   --   HEPARINUNFRC  --  <0.10* 0.14*  CREATININE 1.19 1.18  --     Estimated Creatinine Clearance: 144.2 mL/min (by C-G formula based on SCr of 1.18 mg/dL).   Medical History: Past Medical History:  Diagnosis Date   Acute saddle pulmonary embolism without acute cor pulmonale (HCC) 01/30/2017   Back pain    Diabetic nephropathy (HCC) 08/22/2021   Gallstones    Hyperlipidemia    Type 2 diabetes mellitus without complication, without long-term current use of insulin (HCC) 08/11/2018   VTE (venous thromboembolism) 05/14/2017     Assessment: 41 YOM presented to [facility] ED for CC of RLE swelling. PMH past DVT not on AC PTA. Concern for DVT. Unable to obtain Doppler US to rule out DVT until tomorrow (9/13). Plan to give heparin empirically until then. Pharmacy consulted to dose heparin.   Hgb 13.4 and plt 273. No s/sx bleeding noted in chart.   [place] of legs is negative for DVT. Reviewed with MD, change to DVT px  Goal of Therapy:  Heparin level 0.3-0.7 units/ml Monitor platelets by anticoagulation protocol: Yes   Plan:  D/C heparin  Lovenox 0.5mg /kg (110mg ) sq q24h for DVT px Monitor for s/s of bleeding  Elder Cyphers, BS Pharm D, BCPS Clinical Pharmacist 01/09/2023 11:08 AM

## 2023-01-09 NOTE — Hospital Course (Addendum)
Justin Schmidt  is a 42 y.o. male, with morbid obesity, hypertension, hyperlipidemia, diabetes mellitus type 2, DVT/VTE in the past, not anymore on anticoagulation. -Patient presents to ED secondary to complaints of right lower extremity swelling, pain and redness, patient reports it is ongoing for last 5 days, he was given Cipro by his PCP 2 days ago, and had Unna boots, reports pain has worsened, it is pretty painful when he walks around, he has not been pretty active over the last few days, as well he reports fever and chills at home, denies any injury or trauma to foot.  ED: Febrile 100.3, had leukocytosis at 13.9, tach acid was reassuring at 1.7,     Assessment & Plan:     Principal Problem:   Cellulitis Active Problems:   Hyperlipidemia associated with type 2 diabetes mellitus (HCC)   Fatty liver   Depression, recurrent (HCC)   Morbid obesity with BMI of 60.0-69.9, adult (HCC)   Type 2 diabetes mellitus without complication, without long-term current use of insulin (HCC)   History of pulmonary embolus (PE)   History of DVT (deep vein thrombosis)   Essential hypertension     Cellulitis RT lower Extremity  POA: fever, leukocytosis, edema, pain and warmth, no improvement with oral ciprofloxacin. -Follow on blood cultures -Cont:  vancomycin and cefepime  High risk in the setting of morbid obesity and diabetes mellitus   History of PE and DVT High clinical concern for VTE -H/o DVT 5 years ago,  - Venous Doppler -negative for DVT  - Empirically on heparin GTT for anticoagulation --- continued 01/09/2023, initiating Lovenox subcu for DVT prophylaxis  Hypertension -Blood pressure is elevated, resume home medication and add as needed hydralazine   Type 2 diabetes -Checking CBG q. ACHS, with SSI coverage -on Januvia, will hold, will check A1c   Hyperlipidemia-continue with Crestor   Morbid obesity Fatty liver -BMI of 67, I have discussed at length with the patient, to follow with  the weight loss clinic as he he would be a good candidate for GLP inhibitors versus surgical weight loss intervention intervention

## 2023-01-09 NOTE — Evaluation (Signed)
Occupational Therapy Evaluation Patient Details Name: Justin Schmidt MRN: [id number] DOB: Dec 18, 1980 Today's Date: 01/09/2023   History of Present Illness Justin Schmidt  is a 42 y.o. male, with morbid obesity, hypertension, hyperlipidemia, diabetes mellitus type 2, DVT/VTE in the past, not anymore on anticoagulation.  -Patient presents to ED secondary to complaints of right lower extremity swelling, pain and redness, patient reports it is ongoing for last 5 days, he was given Cipro by his PCP 2 days ago, and had Unna boots, reports pain has worsened, it is pretty painful when he walks around, he has not been pretty active over the last few days, as well he reports fever and chills at home, denies any injury or trauma to foot.  -In ED he was febrile 100.3, had leukocytosis at 13.9, tach acid was reassuring at 1.7, [facility] hospitalist consulted to admit given black improvement on oral Cipro, and severe cellulitis with history of morbid obesity and diabetes mellitus.   Clinical Impression   Pt agreeable to OT/PT evaluation. He reports that prior to his foot pain and infection he was independent with all ADLs, driving, and IADLs. He completed bed mobility going from supine to sitting EOB with mod assist. Pt required max assist to don socks, although he reports that he typically does not wear socks due to difficulty donning. Pt was able to complete sit to stand t/f with CGA, he did not WB through R LE due to pain in foot. He was able to complete functional mobility to bathroom with min assist and use of RW. Completed toilet t/f with min assist, and grab bars. Pt agreed to short term rehab if needed but prefers to go home, does not have 24/7 assist. Pt would continue to benefit from OT services while in acute care setting.       If plan is discharge home, recommend the following: A little help with walking and/or transfers;A little help with bathing/dressing/bathroom;Assistance with cooking/housework;Assistance  with feeding    Functional Status Assessment     Equipment Recommendations  BSC/3in1           Precautions / Restrictions Precautions Precautions: Fall Restrictions Weight Bearing Restrictions: No      Mobility Bed Mobility Overal bed mobility: Needs Assistance Bed Mobility: Supine to Sit     Supine to sit: Supervision, Contact guard     General bed mobility comments: increased time, labored movemet with HOB flat    Transfers Overall transfer level: Needs assistance Equipment used: Rolling walker (2 wheels) Transfers: Sit to/from Stand, Bed to chair/wheelchair/BSC Sit to Stand: Contact guard assist     Step pivot transfers: Min assist, Mod assist     General transfer comment: unsteady labored movement with poor tolerance for weightbearing on RLE      Balance Overall balance assessment: Needs assistance Sitting-balance support: Feet supported, No upper extremity supported Sitting balance-Leahy Scale: Fair Sitting balance - Comments: fair/good seated at EOB   Standing balance support: Reliant on assistive device for balance, During functional activity, Bilateral upper extremity supported Standing balance-Leahy Scale: Poor Standing balance comment: fair/poor using RW with mostly NWB on RLE                           ADL either performed or assessed with clinical judgement   ADL Overall ADL's : Needs assistance/impaired Eating/Feeding: Set up;Sitting   Grooming: Oral care;Set up;Sitting Grooming Details (indicate cue type and reason): pt able to brish teeth with set  up while seated upright in recliner chair Upper Body Bathing: Independent;Sitting   Lower Body Bathing: Moderate assistance;Sitting/lateral leans   Upper Body Dressing : Independent;Sitting   Lower Body Dressing: Moderate assistance;Sitting/lateral leans   Toilet Transfer: Minimal assistance;Grab bars;Rolling walker (2 wheels) Toilet Transfer Details (indicate cue type and  reason): Pt t/f to toilet in bathroom with use of RW and min assist Toileting- Clothing Manipulation and Hygiene: Sitting/lateral lean;Maximal assistance (requires assist due to balance)   Tub/ Shower Transfer: Pharmacist, hospital (2 wheels);Minimal assistance   Functional mobility during ADLs: Minimal assistance;Rolling walker (2 wheels)                                  Pertinent Vitals/Pain Pain Assessment Pain Assessment: 0-10 Pain Score: 8  Pain Location: right foot Pain Descriptors / Indicators: Sore, Grimacing, Guarding, Sharp Pain Intervention(s): Limited activity within patient's tolerance, Monitored during session     Extremity/Trunk Assessment Upper Extremity Assessment Upper Extremity Assessment: Overall WFL for tasks assessed   Lower Extremity Assessment Lower Extremity Assessment: Defer to PT evaluation RLE Deficits / Details: grossly -3/5 RLE: Unable to fully assess due to pain RLE Sensation: WNL RLE Coordination: WNL   Cervical / Trunk Assessment Cervical / Trunk Assessment: Normal   Communication Communication Communication: No apparent difficulties   Cognition Arousal: Alert Behavior During Therapy: WFL for tasks assessed/performed Overall Cognitive Status: Within Functional Limits for tasks assessed                                                        Home Living Family/patient expects to be discharged to:: Private residence Living Arrangements: Non-relatives/Friends Available Help at Discharge: Friend(s);Available PRN/intermittently Type of Home: House Home Access: Stairs to enter [employer] of Steps: 3 Entrance Stairs-Rails: Right;Left;Can reach both Home Layout: One level     Bathroom Shower/Tub: Chief Strategy Officer: Standard Bathroom Accessibility: Yes   Home Equipment: Agricultural consultant (2 wheels);Cane - single point;Grab bars - tub/shower;Shower seat          Prior  Functioning/Environment Prior Level of Function : Independent/Modified Independent;Driving             Mobility Comments: Tourist information centre manager without AD, drives, shops ADLs Comments: Independent                      OT Goals(Current goals can be found in the care plan section) ADL Goals Pt Will Perform Upper Body Bathing: Independently;sitting Pt Will Perform Lower Body Dressing: Independently;sit to/from stand Pt Will Perform Tub/Shower Transfer: tub bench;rolling walker;Tub transfer;with supervision  OT Frequency: Min 2X/week    Co-evaluation PT/OT/SLP Co-Evaluation/Treatment: Yes Reason for Co-Treatment: To address functional/ADL transfers PT goals addressed during session: Mobility/safety with mobility;Balance;Proper use of DME OT goals addressed during session: ADL's and self-care      AM-PAC OT "6 Clicks" Daily Activity     Outcome Measure Help from another person eating meals?: None Help from another person taking care of personal grooming?: None Help from another person toileting, which includes using toliet, bedpan, or urinal?: A Lot Help from another person bathing (including washing, rinsing, drying)?: A Lot Help from another person to put on and taking off regular upper body clothing?: None Help from  another person to put on and taking off regular lower body clothing?: A Lot 6 Click Score: 18   End of Session Equipment Utilized During Treatment: Rolling walker (2 wheels) Nurse Communication: Other (comment) (Pt in bathroom, RN aware)  Activity Tolerance: Patient tolerated treatment well Patient left: Other (comment) (pt left on toilet, RN aware. Pt verbalized understandin to call for help when he was done in bathroom.)  OT Visit Diagnosis: Unsteadiness on feet (R26.81);Muscle weakness (generalized) (M62.81)                Time: 1027-2536 OT Time Calculation (min): 30 min Charges:  OT General Charges $OT Visit: 1 Visit OT Evaluation $OT Eval Low  Complexity: 1 Low OT Treatments $Self Care/Home Management : 8-22 mins   Bevelyn Ngo, OTR/L  01/09/2023, 1:32 PM

## 2023-01-09 NOTE — TOC Initial Note (Signed)
Transition of Care [facility]) - Initial/Assessment Note    Patient Details  Name: Justin Schmidt MRN: [id number] Date of Birth: March 04, 1981  [facility] CM/SW Contact:    Leitha Bleak, RN Phone Number: 01/09/2023, 1:53 PM  Clinical Narrative:          Patient admitted with cellulitis. Patient lives at home with family support. PT is recommending SNF.  Patient is agreeable, no preferences. TOC sending out for bed offers to discuss with the patient. TOC following.         Expected Discharge Plan: Skilled Nursing Facility Barriers to Discharge: Continued Medical Work up   Patient Goals and CMS Choice Patient states their goals for this hospitalization and ongoing recovery are:: agreeable to SNF CMS Medicare.gov Compare Post Acute Care list provided to:: Patient Choice offered to / list presented to : Patient     Expected Discharge Plan and Services       Living arrangements for the past 2 months: Single Family Home                    Prior Living Arrangements/Services Living arrangements for the past 2 months: Single Family Home Lives with:: Relatives Patient language and need for interpreter reviewed:: Yes        Need for Family Participation in Patient Care: Yes (Comment) Care giver support system in place?: Yes (comment)   Criminal Activity/Legal Involvement Pertinent to Current Situation/Hospitalization: No - Comment as needed  Activities of Daily Living Home Assistive Devices/Equipment: Cane (specify quad or straight), Blood pressure cuff, CBG Meter ADL Screening (condition at time of admission) Patient's cognitive ability adequate to safely complete daily activities?: Yes Is the patient deaf or have difficulty hearing?: No Does the patient have difficulty seeing, even when wearing glasses/contacts?: No Does the patient have difficulty concentrating, remembering, or making decisions?: No Patient able to express need for assistance with ADLs?: Yes Does the  patient have difficulty dressing or bathing?: Yes Independently performs ADLs?: Yes (appropriate for developmental age) Does the patient have difficulty walking or climbing stairs?: Yes Weakness of Legs: Right Weakness of Arms/Hands: None  Permission Sought/Granted      Emotional Assessment     Affect (typically observed): Accepting Orientation: : Oriented to Self, Oriented to Place, Oriented to  Time, Oriented to Situation Alcohol / Substance Use: Not Applicable Psych Involvement: No (comment)  Admission diagnosis:  Cellulitis [L03.90] Cellulitis of right lower extremity [L03.115] Sepsis without acute organ dysfunction, due to unspecified organism [facility]) [A41.9] Patient Active Problem List   Diagnosis Date Noted   Cellulitis 01/08/2023   Essential hypertension 01/08/2023   Chronic pain of left knee 07/10/2020   History of pulmonary embolus (PE) 11/08/2018   History of DVT (deep vein thrombosis) 11/08/2018   Type 2 diabetes mellitus without complication, without long-term current use of insulin (HCC) 08/11/2018   Morbid obesity with BMI of 60.0-69.9, adult (HCC) 01/30/2017   Controlled substance agreement signed 09/11/2016   Chronic back pain 09/11/2016   Depression, recurrent (HCC) 09/11/2016   Fatty liver 05/17/2015   Hyperlipidemia associated with type 2 diabetes mellitus (HCC) 09/05/2013   PCP:  Bennie Pierini, FNP Pharmacy:   [facility] [phone] - [place], [place] - [address] [place] [address] Phone: [phone] Fax: [phone]  [facility] [place], [place] - [address] [place] [address] Phone: [phone] Fax: [phone]     Social Determinants  of Health (SDOH) Social History: SDOH Screenings   Food Insecurity: No Food Insecurity (01/08/2023)  Housing: Low Risk  (01/08/2023)  Transportation Needs: No Transportation Needs (01/08/2023)  Utilities: Not At Risk (01/08/2023)   Alcohol Screen: Low Risk  (12/18/2022)  Depression (PHQ2-9): Low Risk  (01/06/2023)  Financial Resource Strain: Low Risk  (12/18/2022)  Physical Activity: Insufficiently Active (12/18/2022)  Social Connections: Socially Isolated (12/18/2022)  Stress: No Stress Concern Present (12/18/2022)  Tobacco Use: Low Risk  (01/08/2023)  Health Literacy: Adequate Health Literacy (12/18/2022)   SDOH Interventions:   Readmission Risk Interventions    01/09/2023    1:51 PM  Readmission Risk Prevention Plan  Post Dischage Appt Not Complete  Medication Screening Complete  Transportation Screening Complete

## 2023-01-09 NOTE — Plan of Care (Signed)
  Problem: Acute Rehab OT Goals (only OT should resolve) Goal: Pt. Will Perform Upper Body Bathing Flowsheets (Taken 01/09/2023 1331) Pt Will Perform Upper Body Bathing:  Independently  sitting Goal: Pt. Will Perform Lower Body Dressing Flowsheets (Taken 01/09/2023 1331) Pt Will Perform Lower Body Dressing:  Independently  sit to/from stand Goal: Pt. Will Perform Tub/Shower Transfer Flowsheets (Taken 01/09/2023 1331) Pt Will Perform Tub/Shower Transfer:  tub bench  rolling walker  Tub transfer  with supervision   Lurena Joiner, OTR/L

## 2023-01-09 NOTE — Progress Notes (Signed)
PROGRESS NOTE    Patient: Justin Schmidt                            PCP: Bennie Pierini, FNP                    DOB: 1980/08/29            DOA: 01/08/2023 [id number]:[id number]             DOS: 01/09/2023, 1:00 PM   LOS: 1 day   Date of Service: The patient was seen and examined on 01/09/2023  Subjective:   The patient was seen and examined this morning. Hemodynamically stable. No issues overnight .  Brief Narrative:   Justin Schmidt  is a 42 y.o. male, with morbid obesity, hypertension, hyperlipidemia, diabetes mellitus type 2, DVT/VTE in the past, not anymore on anticoagulation. -Patient presents to ED secondary to complaints of right lower extremity swelling, pain and redness, patient reports it is ongoing for last 5 days, he was given Cipro by his PCP 2 days ago, and had Unna boots, reports pain has worsened, it is pretty painful when he walks around, he has not been pretty active over the last few days, as well he reports fever and chills at home, denies any injury or trauma to foot.  ED: Febrile 100.3, had leukocytosis at 13.9, tach acid was reassuring at 1.7,     Assessment & Plan:     Principal Problem:   Cellulitis Active Problems:   Hyperlipidemia associated with type 2 diabetes mellitus (HCC)   Fatty liver   Depression, recurrent (HCC)   Morbid obesity with BMI of 60.0-69.9, adult (HCC)   Type 2 diabetes mellitus without complication, without long-term current use of insulin (HCC)   History of pulmonary embolus (PE)   History of DVT (deep vein thrombosis)   Essential hypertension     Cellulitis RT lower Extremity  POA: fever, leukocytosis, edema, pain and warmth, no improvement with oral ciprofloxacin. -Follow on blood cultures -Cont:  vancomycin and cefepime  High risk in the setting of morbid obesity and diabetes mellitus   History of PE and DVT High clinical concern for VTE -H/o DVT 5 years ago,  - Venous Doppler -negative for DVT  - Empirically on heparin  GTT for anticoagulation --- continued 01/09/2023, initiating Lovenox subcu for DVT prophylaxis  Hypertension -Blood pressure is elevated, resume home medication and add as needed hydralazine   Type 2 diabetes -Checking CBG q. ACHS, with SSI coverage -on Januvia, will hold, will check A1c   Hyperlipidemia-continue with Crestor   Morbid obesity Fatty liver -BMI of 67, I have discussed at length with the patient, to follow with the weight loss clinic as he he would be a good candidate for GLP inhibitors versus surgical weight loss intervention intervention           ----------------------------------------------------------------------------------------------------------------------------------------------- Nutritional status:  The patient's BMI is: Body mass index is 67.19 kg/m. I agree with the assessment and plan as outlined   Skin Assessment: I have examined the patient's skin and I agree with the wound assessment as performed by wound care team As outlined belowe:     ------------------------------------------------------------------------------------------------------------------------------------------------  DVT prophylaxis:  Lovenox subcu   Code Status:   Code Status: Full Code  Family Communication: No family member present at bedside-. -Advance care planning has been discussed.   Admission status:   Status is: Inpatient Remains inpatient appropriate because:  Needing IV antibiotics   Disposition: From  - home             Planning for discharge in 1-2 days: to Home   Procedures:   No admission procedures for hospital encounter.   Antimicrobials:  Anti-infectives (From admission, onward)    Start     Dose/Rate Route Frequency Ordered Stop   01/09/23 0900  vancomycin (VANCOREADY) IVPB 1500 mg/300 mL        1,500 mg 150 mL/hr over 120 Minutes Intravenous Every 12 hours 01/08/23 2041     01/09/23 0900  ceFEPIme (MAXIPIME) 2 g in sodium chloride 0.9 %  100 mL IVPB        2 g 200 mL/hr over 30 Minutes Intravenous Every 8 hours 01/08/23 2041     01/08/23 2000  vancomycin (VANCOREADY) IVPB 500 mg/100 mL        500 mg 100 mL/hr over 60 Minutes Intravenous  Once 01/08/23 1927 01/08/23 2114   01/08/23 1930  vancomycin (VANCOREADY) IVPB 2000 mg/400 mL        2,000 mg 200 mL/hr over 120 Minutes Intravenous  Once 01/08/23 1908 01/08/23 2309   01/08/23 1915  ceFEPIme (MAXIPIME) 2 g in sodium chloride 0.9 % 100 mL IVPB        2 g 200 mL/hr over 30 Minutes Intravenous  Once 01/08/23 1908 01/08/23 2015   01/08/23 1715  cefTRIAXone (ROCEPHIN) 2 g in sodium chloride 0.9 % 100 mL IVPB  Status:  Discontinued        2 g 200 mL/hr over 30 Minutes Intravenous Every 24 hours 01/08/23 1707 01/08/23 1908        Medication:   acidophilus  2 capsule Oral TID   enoxaparin (LOVENOX) injection  110 mg Subcutaneous Q24H   escitalopram  10 mg Oral Daily   insulin aspart  0-15 Units Subcutaneous TID WC   insulin aspart  0-5 Units Subcutaneous QHS   lisinopril  10 mg Oral Daily   rosuvastatin  5 mg Oral Daily    acetaminophen **OR** acetaminophen, albuterol, hydrALAZINE, ondansetron **OR** ondansetron (ZOFRAN) IV, polyethylene glycol   Objective:   Vitals:   01/08/23 2324 01/09/23 0413 01/09/23 0824 01/09/23 1207  BP: 125/64 122/76 (!) 152/75 (!) 151/59  Pulse: (!) 103 90 96 94  Resp: 20 20 (!) 22 (!) 22  Temp: 99.4 F (37.4 C) 99 F (37.2 C) 99.4 F (37.4 C) 99.3 F (37.4 C)  TempSrc: Oral Oral Oral Oral  SpO2: 99% 96% 97% 95%  Weight:      Height:        Intake/Output Summary (Last 24 hours) at 01/09/2023 1300 Last data filed at 01/09/2023 1125 Gross per 24 hour  Intake 2065.29 ml  Output 1100 ml  Net 965.29 ml   Filed Weights   01/08/23 1413 01/08/23 1808  Weight: (!) 204.1 kg (!) 206.4 kg     Physical examination:   Constitution:  Alert, cooperative, no distress,  Appears calm and comfortable  Psychiatric:   Normal and stable  mood and affect, cognition intact,   HEENT:        Normocephalic, PERRL, otherwise with in Normal limits  Chest:         Chest symmetric Cardio vascular:  S1/S2, RRR, No murmure, No Rubs or Gallops  pulmonary: Clear to auscultation bilaterally, respirations unlabored, negative wheezes / crackles Abdomen: Soft, non-tender, non-distended, bowel sounds,no masses, no organomegaly Muscular skeletal: Limited exam - in bed, able to move all  4 extremities,   Neuro: CNII-XII intact. , normal motor and sensation, reflexes intact  Extremities: No pitting edema lower extremities, +2 pulses  Skin: Dry, warm to touch, negative for any Rashes, floor extremity erythema edema above ankle to but below the knee area Wounds: Left lower extremity  Good muscle tone,  joints appear normal       ------------------------------------------------------------------------------------------------------------------------------------------    LABs:     Latest Ref Rng & Units 01/09/2023    2:04 AM 01/08/2023    3:02 PM 12/22/2022    3:04 PM  CBC  WBC 4.0 - 10.5 K/uL 11.5  13.9  9.3   Hemoglobin 13.0 - 17.0 g/dL 29.5  28.4  13.2   Hematocrit 39.0 - 52.0 % 36.7  40.3  43.5   Platelets 150 - 400 K/uL 263  273  296       Latest Ref Rng & Units 01/09/2023    2:04 AM 01/08/2023    3:02 PM 12/22/2022    3:04 PM  CMP  Glucose 70 - 99 mg/dL 440  102  725   BUN 6 - 20 mg/dL 14  15  21    Creatinine 0.61 - 1.24 mg/dL 3.66  4.40  3.47   Sodium 135 - 145 mmol/L 133  135  138   Potassium 3.5 - 5.1 mmol/L 3.3  3.6  4.4   Chloride 98 - 111 mmol/L 98  98  100   CO2 22 - 32 mmol/L 24  26  21    Calcium 8.9 - 10.3 mg/dL 8.2  8.7  9.5   Total Protein 6.5 - 8.1 g/dL 7.3  8.1  7.4   Total Bilirubin 0.3 - 1.2 mg/dL 0.9  1.2  <4.2   Alkaline Phos 38 - 126 U/L 45  51  90   AST 15 - 41 U/L 16  17  28    ALT 0 - 44 U/L 28  32  49        Micro Results Recent Results (from the past 240 hour(s))  Blood Culture (routine x 2)      Status: None (Preliminary result)   Collection Time: 01/08/23  3:02 PM   Specimen: Right Antecubital; Blood  Result Value Ref Range Status   Specimen Description RIGHT ANTECUBITAL  Final   Special Requests   Final    BOTTLES DRAWN AEROBIC AND ANAEROBIC Blood Culture adequate volume   Culture   Final    NO GROWTH < 24 HOURS Performed at [facility], [address] [place], [place] [address]    Report Status PENDING  Incomplete  Resp panel by RT-PCR (RSV, Flu A&B, Covid) Anterior Nasal Swab     Status: None   Collection Time: 01/08/23  5:42 PM   Specimen: Anterior Nasal Swab  Result Value Ref Range Status   SARS Coronavirus 2 by RT PCR NEGATIVE NEGATIVE Final    Comment: (NOTE) SARS-CoV-2 target nucleic acids are NOT DETECTED.  The SARS-CoV-2 RNA is generally detectable in upper respiratory specimens during the acute phase of infection. The lowest concentration of SARS-CoV-2 viral copies this assay can detect is 138 copies/mL. A negative result does not preclude SARS-Cov-2 infection and should not be used as the sole basis for treatment or other patient management decisions. A negative result may occur with  improper specimen collection/handling, submission of specimen other than nasopharyngeal swab, presence of viral mutation(s) within the areas targeted by this assay, and inadequate number of viral copies(<138 copies/mL). A negative result must be  combined with clinical observations, patient history, and epidemiological information. The expected result is Negative.  Fact Sheet for Patients:  BloggerCourse.com  Fact Sheet for Healthcare Providers:  SeriousBroker.it  This test is no t yet approved or cleared by the [place] FDA and  has been authorized for detection and/or diagnosis of SARS-CoV-2 by FDA under an Emergency Use Authorization (EUA). This EUA will remain  in effect (meaning this test can be used) for the  duration of the COVID-19 declaration under Section 564(b)(1) of the Act, 21 U.S.C.section 360bbb-3(b)(1), unless the authorization is terminated  or revoked sooner.       Influenza A by PCR NEGATIVE NEGATIVE Final   Influenza B by PCR NEGATIVE NEGATIVE Final    Comment: (NOTE) The Xpert Xpress SARS-CoV-2/FLU/RSV plus assay is intended as an aid in the diagnosis of influenza from Nasopharyngeal swab specimens and should not be used as a sole basis for treatment. Nasal washings and aspirates are unacceptable for Xpert Xpress SARS-CoV-2/FLU/RSV testing.  Fact Sheet for Patients: BloggerCourse.com  Fact Sheet for Healthcare Providers: SeriousBroker.it  This test is not yet approved or cleared by the [place] FDA and has been authorized for detection and/or diagnosis of SARS-CoV-2 by FDA under an Emergency Use Authorization (EUA). This EUA will remain in effect (meaning this test can be used) for the duration of the COVID-19 declaration under Section 564(b)(1) of the Act, 21 U.S.C. section 360bbb-3(b)(1), unless the authorization is terminated or revoked.     Resp Syncytial Virus by PCR NEGATIVE NEGATIVE Final    Comment: (NOTE) Fact Sheet for Patients: BloggerCourse.com  Fact Sheet for Healthcare Providers: SeriousBroker.it  This test is not yet approved or cleared by the [place] FDA and has been authorized for detection and/or diagnosis of SARS-CoV-2 by FDA under an Emergency Use Authorization (EUA). This EUA will remain in effect (meaning this test can be used) for the duration of the COVID-19 declaration under Section 564(b)(1) of the Act, 21 U.S.C. section 360bbb-3(b)(1), unless the authorization is terminated or revoked.  Performed at [facility], [address], [place], [place] [address]   Blood Culture (routine x 2)     Status: None (Preliminary result)    Collection Time: 01/08/23  5:42 PM   Specimen: BLOOD  Result Value Ref Range Status   Specimen Description BLOOD BLOOD LEFT ARM  Final   Special Requests   Final    BOTTLES DRAWN AEROBIC AND ANAEROBIC Blood Culture results may not be optimal due to an excessive volume of blood received in culture bottles   Culture   Final    NO GROWTH < 24 HOURS Performed at [facility], [address] [place], [place] [address]    Report Status PENDING  Incomplete    Radiology Reports US Venous Img Lower Unilateral Right (DVT)  Result Date: 01/09/2023 CLINICAL DATA:  Right calf cellulitis, redness, edema, pain for 3 days EXAM: RIGHT LOWER EXTREMITY VENOUS DOPPLER ULTRASOUND TECHNIQUE: Gray-scale sonography with compression, as well as color and duplex ultrasound, were performed to evaluate the deep venous system(s) from the level of the common femoral vein through the popliteal and proximal calf veins. COMPARISON:  None available FINDINGS: VENOUS Normal compressibility of the common femoral, superficial femoral, and popliteal veins, as well as the visualized calf veins. Visualized portions of profunda femoral vein and great saphenous vein unremarkable. No filling defects to suggest DVT on grayscale or color Doppler imaging. Doppler waveforms show normal direction of venous flow, normal respiratory plasticity and  response to augmentation. Limited views of the contralateral common femoral vein are unremarkable. OTHER None. Limitations: Right calf veins not well visualized. IMPRESSION: No right lower extremity DVT. Electronically Signed   By: Acquanetta Belling M.D.   On: 01/09/2023 10:22   DG Tibia/Fibula Right  Result Date: 01/08/2023 CLINICAL DATA:  Questionable sepsis. Bilateral lower extremity cellulitis. Increased right lower extremity pain. EXAM: RIGHT TIBIA AND FIBULA - 2 VIEW COMPARISON:  None Available. FINDINGS: There is no acute fracture or dislocation. The bones are well mineralized. There is diffuse  subcutaneous edema. No radiopaque foreign object or soft tissue gas. IMPRESSION: 1. No acute fracture or dislocation. 2. Diffuse subcutaneous edema. Electronically Signed   By: Elgie Collard M.D.   On: 01/08/2023 19:47   DG Chest Port 1 View  Result Date: 01/08/2023 CLINICAL DATA:  Questionable sepsis. EXAM: PORTABLE CHEST 1 VIEW COMPARISON:  Chest radiograph dated 03/14/2017. FINDINGS: No focal consolidation, pleural effusion or pneumothorax. Stable cardiac silhouette. No acute osseous pathology. IMPRESSION: No active disease. Electronically Signed   By: Elgie Collard M.D.   On: 01/08/2023 19:46    SIGNED: Kendell Bane, MD, FHM. FAAFP. [facility] - [facility] Time spent - 55 min.  In seeing, evaluating and examining the patient. Reviewing medical records, labs, drawn plan of care. [facility],  Pager (please use [email or web address] to page/ text) Please use Epic Secure Chat for non-urgent communication (7AM-7PM)  If 7PM-7AM, please contact night-coverage [email or web address], 01/09/2023, 1:00 PM

## 2023-01-09 NOTE — Progress Notes (Signed)
ANTICOAGULATION CONSULT NOTE - Follow Up  Pharmacy Consult for heparin  Indication: DVT  Allergies  Allergen Reactions   Metformin And Related     diarrhea   Xarelto [Rivaroxaban]     DROWSINESS, FATIGUE    Patient Measurements: Height: 5\' 9"  (175.3 cm) Weight: (!) 206.4 kg (455 lb) IBW/kg (Calculated) : 70.7 Heparin Dosing Weight: HEPARIN DW (KG): 123.8   Vital Signs: Temp: 99.4 F (37.4 C) (09/12 2324) Temp Source: Oral (09/12 2324) BP: 125/64 (09/12 2324) Pulse Rate: 103 (09/12 2324)  Labs: Recent Labs    01/08/23 1502 01/09/23 0204  HGB 13.4 12.3*  HCT 40.3 36.7*  PLT 273 263  APTT 28  --   LABPROT 14.8  --   INR 1.1  --   HEPARINUNFRC  --  <0.10*  CREATININE 1.19  --     Estimated Creatinine Clearance: 143 mL/min (by C-G formula based on SCr of 1.19 mg/dL).   Medical History: Past Medical History:  Diagnosis Date   Acute saddle pulmonary embolism without acute cor pulmonale (HCC) 01/30/2017   Back pain    Diabetic nephropathy (HCC) 08/22/2021   Gallstones    Hyperlipidemia    Type 2 diabetes mellitus without complication, without long-term current use of insulin (HCC) 08/11/2018   VTE (venous thromboembolism) 05/14/2017     Assessment: 41 YOM presented to [facility] for CC of RLE swelling. PMH past DVT not on AC PTA. Concern for DVT. Unable to obtain Doppler US to rule out DVT until tomorrow (9/13). Plan to give heparin empirically until then. Pharmacy consulted to dose heparin.   Hgb 13.4 and plt 273. No s/sx bleeding noted in chart.   9/13 AM: heparin level returned at <0.1 on 2200 units/hr (undetectable). Per RN, no issues with the heparin infusion running continuously or signs/symptoms of bleeding. Hgb dropped from 13.4 >> 12.3, plts WNL   Goal of Therapy:  Heparin level 0.3-0.7 units/ml Monitor platelets by anticoagulation protocol: Yes   Plan:  Heparin bolus of 3700 units x1 Increase heparin infusion to 2600 units/hr  Will f/u heparin level  in 6 hours Monitor daily heparin level and CBC Continue to monitor H&H    Arabella Merles, PharmD. Clinical Pharmacist 01/09/2023 3:11 AM

## 2023-01-09 NOTE — Plan of Care (Signed)
  Problem: Acute Rehab PT Goals(only PT should resolve) Goal: Pt Will Go Supine/Side To Sit Outcome: Progressing Flowsheets (Taken 01/09/2023 1125) Pt will go Supine/Side to Sit:  with modified independence  with supervision Goal: Patient Will Transfer Sit To/From Stand Outcome: Progressing Flowsheets (Taken 01/09/2023 1125) Patient will transfer sit to/from stand:  with supervision  with contact guard assist Goal: Pt Will Transfer Bed To Chair/Chair To Bed Outcome: Progressing Flowsheets (Taken 01/09/2023 1125) Pt will Transfer Bed to Chair/Chair to Bed:  with supervision  with contact guard assist Goal: Pt Will Ambulate Outcome: Progressing Flowsheets (Taken 01/09/2023 1125) Pt will Ambulate:  25 feet  with minimal assist  with contact guard assist  with rolling walker   11:26 AM, 01/09/23 Ocie Bob, MPT Physical Therapist with [facility] [phone] office [phone] mobile phone

## 2023-01-09 NOTE — Care Management Important Message (Signed)
Important Message  Patient Details  Name: ANTOAN LAGUNES MRN: [id number] Date of Birth: Oct 01, 1980   Medicare Important Message Given:  N/A - LOS <3 / Initial given by admissions     Corey Harold 01/09/2023, 12:32 PM

## 2023-01-09 NOTE — Progress Notes (Deleted)
ANTICOAGULATION CONSULT NOTE - Follow Up  Pharmacy Consult for heparin =>lovenox DVT px Indication: DVT => DVT px  Allergies  Allergen Reactions   Metformin And Related     diarrhea   Xarelto [Rivaroxaban]     DROWSINESS, FATIGUE    Patient Measurements: Height: 5\' 9"  (175.3 cm) Weight: (!) 206.4 kg (455 lb) IBW/kg (Calculated) : 70.7  HEPARIN DW (KG): 123.8   Vital Signs: Temp: 99.4 F (37.4 C) (09/13 0824) Temp Source: Oral (09/13 0824) BP: 152/75 (09/13 0824) Pulse Rate: 96 (09/13 0824)  Labs: Recent Labs    01/08/23 1502 01/09/23 0204 01/09/23 0909  HGB 13.4 12.3*  --   HCT 40.3 36.7*  --   PLT 273 263  --   APTT 28  --   --   LABPROT 14.8  --   --   INR 1.1  --   --   HEPARINUNFRC  --  <0.10* 0.14*  CREATININE 1.19 1.18  --     Estimated Creatinine Clearance: 144.2 mL/min (by C-G formula based on SCr of 1.18 mg/dL).   Medical History: Past Medical History:  Diagnosis Date   Acute saddle pulmonary embolism without acute cor pulmonale (HCC) 01/30/2017   Back pain    Diabetic nephropathy (HCC) 08/22/2021   Gallstones    Hyperlipidemia    Type 2 diabetes mellitus without complication, without long-term current use of insulin (HCC) 08/11/2018   VTE (venous thromboembolism) 05/14/2017     Assessment: 41 YOM presented to [facility] for CC of RLE swelling. PMH past DVT not on AC PTA. Concern for DVT. Unable to obtain Doppler US to rule out DVT until tomorrow (9/13). Plan to give heparin empirically until then. Pharmacy consulted to dose heparin.   Hgb 13.4 and plt 273. No s/sx bleeding noted in chart.   [place] of legs is negative for DVT. Reviewed with MD, change to DVT px  Goal of Therapy:  Heparin level 0.3-0.7 units/ml Monitor platelets by anticoagulation protocol: Yes   Plan:  D/C heparin  Lovenox 1mg /kg (110mg ) sq q24h for DVT px Monitor for s/s of bleeding  Elder Cyphers, BS Pharm D, BCPS Clinical Pharmacist 01/09/2023 10:59 AM

## 2023-01-10 DIAGNOSIS — L03115 Cellulitis of right lower limb: Secondary | ICD-10-CM | POA: Diagnosis not present

## 2023-01-10 LAB — GLUCOSE, CAPILLARY
Glucose-Capillary: 142 mg/dL — ABNORMAL HIGH (ref 70–99)
Glucose-Capillary: 137 mg/dL — ABNORMAL HIGH (ref 70–99)
Glucose-Capillary: 128 mg/dL — ABNORMAL HIGH (ref 70–99)
Glucose-Capillary: 143 mg/dL — ABNORMAL HIGH (ref 70–99)

## 2023-01-10 LAB — CBC
HCT: 35.9 % — ABNORMAL LOW (ref 39.0–52.0)
Hemoglobin: 11.7 g/dL — ABNORMAL LOW (ref 13.0–17.0)
MCH: 31.2 pg (ref 26.0–34.0)
MCHC: 32.6 g/dL (ref 30.0–36.0)
MCV: 95.7 fL (ref 80.0–100.0)
Platelets: 273 10*3/uL (ref 150–400)
RBC: 3.75 MIL/uL — ABNORMAL LOW (ref 4.22–5.81)
RDW: 13.3 % (ref 11.5–15.5)
WBC: 11.5 10*3/uL — ABNORMAL HIGH (ref 4.0–10.5)
nRBC: 0 % (ref 0.0–0.2)

## 2023-01-10 LAB — HEMOGLOBIN A1C
Hgb A1c MFr Bld: 7.3 % — ABNORMAL HIGH (ref 4.8–5.6)
Mean Plasma Glucose: 163 mg/dL

## 2023-01-10 MED ORDER — CLINDAMYCIN PHOSPHATE 600 MG/50ML IV SOLN
600.0000 mg | Freq: Three times a day (TID) | INTRAVENOUS | Status: DC
  Administered 2023-01-10 – 2023-01-12 (×7): 600 mg via INTRAVENOUS
  Filled 2023-01-10 (×7): qty 50

## 2023-01-10 NOTE — Progress Notes (Signed)
Patient's sister Yvonna Alanis is at bedside at this time. Inquired about the plan for patient to go to SNF rehab. This Clinical research associate read [facility] note and MD recommendation to patient and sister. Patient also stated that, his sister is ok to talk with about his care while in the hospital. Plan of care ongoing.

## 2023-01-10 NOTE — TOC Progression Note (Signed)
Transition of Care [facility]) - Progression Note    Patient Details  Name: Justin Schmidt MRN: [id number] Date of Birth: 11-01-1980  Transition of Care [facility]) CM/SW Contact  Villa Herb, [place] Phone Number: 01/10/2023, 11:15 AM  Clinical Narrative:    There are no bed offers at this time. CSW sent pts SNF referral to additional facilities at this time. Auth started at this time, it is pending. TOC to follow.   Expected Discharge Plan: Skilled Nursing Facility Barriers to Discharge: Continued Medical Work up  Expected Discharge Plan and Services       Living arrangements for the past 2 months: Single Family Home                                       Social Determinants of Health (SDOH) Interventions SDOH Screenings   Food Insecurity: No Food Insecurity (01/08/2023)  Housing: Low Risk  (01/08/2023)  Transportation Needs: No Transportation Needs (01/08/2023)  Utilities: Not At Risk (01/08/2023)  Alcohol Screen: Low Risk  (12/18/2022)  Depression (PHQ2-9): Low Risk  (01/06/2023)  Financial Resource Strain: Low Risk  (12/18/2022)  Physical Activity: Insufficiently Active (12/18/2022)  Social Connections: Socially Isolated (12/18/2022)  Stress: No Stress Concern Present (12/18/2022)  Tobacco Use: Low Risk  (01/08/2023)  Health Literacy: Adequate Health Literacy (12/18/2022)    Readmission Risk Interventions    01/09/2023    1:51 PM  Readmission Risk Prevention Plan  Post Dischage Appt Not Complete  Medication Screening Complete  Transportation Screening Complete

## 2023-01-10 NOTE — Progress Notes (Signed)
PROGRESS NOTE    Patient: Justin Schmidt                            PCP: Bennie Pierini, FNP                    DOB: 01-05-1981            DOA: 01/08/2023 GNF:[id number]             DOS: 01/10/2023, 12:28 PM   LOS: 2 days   Date of Service: The patient was seen and examined on 01/10/2023  Subjective:   The patient was seen and examined this morning, reporting improved erythema and some mild improvement in edema still complaining of pain and discomfort. Unable to ambulate  Brief Narrative:   Justin Schmidt  is a 42 y.o. male, with morbid obesity, hypertension, hyperlipidemia, diabetes mellitus type 2, DVT/VTE in the past, not anymore on anticoagulation. -Patient presents to ED secondary to complaints of right lower extremity swelling, pain and redness, patient reports it is ongoing for last 5 days, he was given Cipro by his PCP 2 days ago, and had Unna boots, reports pain has worsened, it is pretty painful when he walks around, he has not been pretty active over the last few days, as well he reports fever and chills at home, denies any injury or trauma to foot.  ED: Febrile 100.3, had leukocytosis at 13.9, tach acid was reassuring at 1.7,     Assessment & Plan:     Principal Problem:   Cellulitis Active Problems:   Hyperlipidemia associated with type 2 diabetes mellitus (HCC)   Fatty liver   Depression, recurrent (HCC)   Morbid obesity with BMI of 60.0-69.9, adult (HCC)   Type 2 diabetes mellitus without complication, without long-term current use of insulin (HCC)   History of pulmonary embolus (PE)   History of DVT (deep vein thrombosis)   Essential hypertension     Cellulitis RT lower Extremity  -Hemodynamically stable, afebrile, normotensive Improved leukocytosis,  POA: fever, leukocytosis, edema, pain and warmth, no improvement with oral ciprofloxacin. -Blood cultures>>> no growth to date -Cont:  vancomycin and cefepime >>> switching to IV clindamycin>>> if patient  responds, will switch to p.o. clindamycin High risk in the setting of morbid obesity and diabetes mellitus   History of PE and DVT High clinical concern for VTE-recurrent DVT ruled out -H/o DVT 5 years ago,  - Venous Doppler -negative for DVT  - Empirically on heparin GTT for anticoagulation --- continued 01/09/2023, initiating Lovenox subcu for DVT prophylaxis  Hypertension -Blood pressure is elevated, resume home medication and add as needed hydralazine   Type 2 diabetes -Continue checking blood sugar closely, q. ACHS with Isai coverage -on Januvia, will hold, will check A1c CBG (last 3)  Recent Labs    01/09/23 2043 01/10/23 0658 01/10/23 1126  GLUCAP 140* 142* 137*      Hyperlipidemia -Will continue Crestor    Morbid obesity Fatty liver -BMI of 67, I have discussed at length with the patient, to follow with the weight loss clinic as he he would be a good candidate for GLP inhibitors versus surgical weight loss intervention intervention         Severe debility -  due to multiple comorbidities including generalized weaknesses, lower extremity infection severe obesity--- that is post evaluation by PT/OT-commending SNF Patient is considering  Nutritional status:  The patient's BMI is: Body mass  index is 67.19 kg/m. I agree with the assessment and plan as outlined    Skin Assessment: I have examined the patient's skin and I agree with the wound assessment as performed by wound care team As outlined belowe:     ------------------------------------------------------------------------------------------------------------------------------------------------   DVT prophylaxis:  Lovenox subcu     Code Status:   Code Status: Full Code   Family Communication: No family member present at bedside-. -Advance care planning has been discussed.    Admission status:   Status is: Inpatient Remains inpatient appropriate because: Needing IV antibiotics     Disposition:  From  - home                        Planning for discharge in 1-2 days: to possibly SNF  D  Procedures:   No admission procedures for hospital encounter.   Antimicrobials:  Anti-infectives (From admission, onward)    Start     Dose/Rate Route Frequency Ordered Stop   01/10/23 0900  clindamycin (CLEOCIN) IVPB 600 mg        600 mg 100 mL/hr over 30 Minutes Intravenous Every 8 hours 01/10/23 0752 01/17/23 0859   01/09/23 0900  vancomycin (VANCOREADY) IVPB 1500 mg/300 mL  Status:  Discontinued        1,500 mg 150 mL/hr over 120 Minutes Intravenous Every 12 hours 01/08/23 2041 01/10/23 0750   01/09/23 0900  ceFEPIme (MAXIPIME) 2 g in sodium chloride 0.9 % 100 mL IVPB  Status:  Discontinued        2 g 200 mL/hr over 30 Minutes Intravenous Every 8 hours 01/08/23 2041 01/10/23 0750   01/08/23 2000  vancomycin (VANCOREADY) IVPB 500 mg/100 mL        500 mg 100 mL/hr over 60 Minutes Intravenous  Once 01/08/23 1927 01/08/23 2114   01/08/23 1930  vancomycin (VANCOREADY) IVPB 2000 mg/400 mL        2,000 mg 200 mL/hr over 120 Minutes Intravenous  Once 01/08/23 1908 01/08/23 2309   01/08/23 1915  ceFEPIme (MAXIPIME) 2 g in sodium chloride 0.9 % 100 mL IVPB        2 g 200 mL/hr over 30 Minutes Intravenous  Once 01/08/23 1908 01/08/23 2015   01/08/23 1715  cefTRIAXone (ROCEPHIN) 2 g in sodium chloride 0.9 % 100 mL IVPB  Status:  Discontinued        2 g 200 mL/hr over 30 Minutes Intravenous Every 24 hours 01/08/23 1707 01/08/23 1908        Medication:   acidophilus  2 capsule Oral TID   enoxaparin (LOVENOX) injection  110 mg Subcutaneous Q24H   escitalopram  10 mg Oral Daily   insulin aspart  0-15 Units Subcutaneous TID WC   insulin aspart  0-5 Units Subcutaneous QHS   lisinopril  10 mg Oral Daily   rosuvastatin  5 mg Oral Daily    acetaminophen **OR** acetaminophen, albuterol, hydrALAZINE, HYDROmorphone (DILAUDID) injection, ondansetron **OR** ondansetron (ZOFRAN) IV, oxyCODONE,  polyethylene glycol   Objective:   Vitals:   01/09/23 0824 01/09/23 1207 01/09/23 2046 01/10/23 0500  BP: (!) 152/75 (!) 151/59 113/67 107/66  Pulse: 96 94 95 96  Resp: (!) 22 (!) 22 (!) 24 20  Temp: 99.4 F (37.4 C) 99.3 F (37.4 C) 98.6 F (37 C) 98.3 F (36.8 C)  TempSrc: Oral Oral  Oral  SpO2: 97% 95% 99% 100%  Weight:      Height:  Intake/Output Summary (Last 24 hours) at 01/10/2023 1228 Last data filed at 01/10/2023 1610 Gross per 24 hour  Intake 1440.4 ml  Output 2000 ml  Net -559.6 ml   Filed Weights   01/08/23 1413 01/08/23 1808  Weight: (!) 204.1 kg (!) 206.4 kg     Physical examination:   Constitution:  Alert, cooperative, no distress,  Appears calm and comfortable  Psychiatric:   Normal and stable mood and affect, cognition intact,   HEENT:        Normocephalic, PERRL, otherwise with in Normal limits  Chest:         Chest symmetric Cardio vascular:  S1/S2, RRR, No murmure, No Rubs or Gallops  pulmonary: Clear to auscultation bilaterally, respirations unlabored, negative wheezes / crackles Abdomen: Soft, non-tender, non-distended, bowel sounds,no masses, no organomegaly Muscular skeletal: Limited exam - in bed, able to move all 4 extremities,   Neuro: CNII-XII intact. , normal motor and sensation, reflexes intact  Extremities: No pitting edema lower extremities, +2 pulses  Skin: Dry, warm to touch, negative for any Rashes, No open wounds, tensive erythema edema lower leg, with mild improvement from demarcation Wounds:   Good muscle tone,  joints appear normal     ------------------------------------------------------------------------------------------------------------------------------------------    LABs:     Latest Ref Rng & Units 01/10/2023    4:23 AM 01/09/2023    2:04 AM 01/08/2023    3:02 PM  CBC  WBC 4.0 - 10.5 K/uL 11.5  11.5  13.9   Hemoglobin 13.0 - 17.0 g/dL 96.0  45.4  09.8   Hematocrit 39.0 - 52.0 % 35.9  36.7  40.3    Platelets 150 - 400 K/uL 273  263  273       Latest Ref Rng & Units 01/09/2023    2:04 AM 01/08/2023    3:02 PM 12/22/2022    3:04 PM  CMP  Glucose 70 - 99 mg/dL 119  147  829   BUN 6 - 20 mg/dL 14  15  21    Creatinine 0.61 - 1.24 mg/dL 5.62  1.30  8.65   Sodium 135 - 145 mmol/L 133  135  138   Potassium 3.5 - 5.1 mmol/L 3.3  3.6  4.4   Chloride 98 - 111 mmol/L 98  98  100   CO2 22 - 32 mmol/L 24  26  21    Calcium 8.9 - 10.3 mg/dL 8.2  8.7  9.5   Total Protein 6.5 - 8.1 g/dL 7.3  8.1  7.4   Total Bilirubin 0.3 - 1.2 mg/dL 0.9  1.2  <7.8   Alkaline Phos 38 - 126 U/L 45  51  90   AST 15 - 41 U/L 16  17  28    ALT 0 - 44 U/L 28  32  49        Micro Results Recent Results (from the past 240 hour(s))  Blood Culture (routine x 2)     Status: None (Preliminary result)   Collection Time: 01/08/23  3:02 PM   Specimen: Right Antecubital; Blood  Result Value Ref Range Status   Specimen Description RIGHT ANTECUBITAL  Final   Special Requests   Final    BOTTLES DRAWN AEROBIC AND ANAEROBIC Blood Culture adequate volume   Culture   Final    NO GROWTH 2 DAYS Performed at [facility], [address] [place], [place] [address]    Report Status PENDING  Incomplete  Resp panel by RT-PCR (RSV, Flu A&B, Covid)  Anterior Nasal Swab     Status: None   Collection Time: 01/08/23  5:42 PM   Specimen: Anterior Nasal Swab  Result Value Ref Range Status   SARS Coronavirus 2 by RT PCR NEGATIVE NEGATIVE Final    Comment: (NOTE) SARS-CoV-2 target nucleic acids are NOT DETECTED.  The SARS-CoV-2 RNA is generally detectable in upper respiratory specimens during the acute phase of infection. The lowest concentration of SARS-CoV-2 viral copies this assay can detect is 138 copies/mL. A negative result does not preclude SARS-Cov-2 infection and should not be used as the sole basis for treatment or other patient management decisions. A negative result may occur with  improper specimen collection/handling,  submission of specimen other than nasopharyngeal swab, presence of viral mutation(s) within the areas targeted by this assay, and inadequate number of viral copies(<138 copies/mL). A negative result must be combined with clinical observations, patient history, and epidemiological information. The expected result is Negative.  Fact Sheet for Patients:  BloggerCourse.com  Fact Sheet for Healthcare Providers:  SeriousBroker.it  This test is no t yet approved or cleared by the [place] FDA and  has been authorized for detection and/or diagnosis of SARS-CoV-2 by FDA under an Emergency Use Authorization (EUA). This EUA will remain  in effect (meaning this test can be used) for the duration of the COVID-19 declaration under Section 564(b)(1) of the Act, 21 U.S.C.section 360bbb-3(b)(1), unless the authorization is terminated  or revoked sooner.       Influenza A by PCR NEGATIVE NEGATIVE Final   Influenza B by PCR NEGATIVE NEGATIVE Final    Comment: (NOTE) The Xpert Xpress SARS-CoV-2/FLU/RSV plus assay is intended as an aid in the diagnosis of influenza from Nasopharyngeal swab specimens and should not be used as a sole basis for treatment. Nasal washings and aspirates are unacceptable for Xpert Xpress SARS-CoV-2/FLU/RSV testing.  Fact Sheet for Patients: BloggerCourse.com  Fact Sheet for Healthcare Providers: SeriousBroker.it  This test is not yet approved or cleared by the [place] FDA and has been authorized for detection and/or diagnosis of SARS-CoV-2 by FDA under an Emergency Use Authorization (EUA). This EUA will remain in effect (meaning this test can be used) for the duration of the COVID-19 declaration under Section 564(b)(1) of the Act, 21 U.S.C. section 360bbb-3(b)(1), unless the authorization is terminated or revoked.     Resp Syncytial Virus by PCR NEGATIVE  NEGATIVE Final    Comment: (NOTE) Fact Sheet for Patients: BloggerCourse.com  Fact Sheet for Healthcare Providers: SeriousBroker.it  This test is not yet approved or cleared by the [place] FDA and has been authorized for detection and/or diagnosis of SARS-CoV-2 by FDA under an Emergency Use Authorization (EUA). This EUA will remain in effect (meaning this test can be used) for the duration of the COVID-19 declaration under Section 564(b)(1) of the Act, 21 U.S.C. section 360bbb-3(b)(1), unless the authorization is terminated or revoked.  Performed at [facility], [address] [place], [place] [address]   Blood Culture (routine x 2)     Status: None (Preliminary result)   Collection Time: 01/08/23  5:42 PM   Specimen: BLOOD  Result Value Ref Range Status   Specimen Description BLOOD BLOOD LEFT ARM  Final   Special Requests   Final    BOTTLES DRAWN AEROBIC AND ANAEROBIC Blood Culture results may not be optimal due to an excessive volume of blood received in culture bottles   Culture   Final    NO GROWTH 2 DAYS Performed at  [facility], [address], [place], [place] [address]    Report Status PENDING  Incomplete    Radiology Reports No results found.  SIGNED: Kendell Bane, MD, FHM. FAAFP. [facility] - [facility] Time spent - 35 min.  In seeing, evaluating and examining the patient. Reviewing medical records, labs, drawn plan of care. [facility],  Pager (please use [email or web address] to page/ text) Please use Epic Secure Chat for non-urgent communication (7AM-7PM)  If 7PM-7AM, please contact night-coverage [email or web address], 01/10/2023, 12:28 PM

## 2023-01-10 NOTE — Progress Notes (Signed)
Patient working on ROM in bed, we did attempt to move patient into chair this shift but he was unable to stand to move to chair due to pain in feet.

## 2023-01-11 DIAGNOSIS — L03115 Cellulitis of right lower limb: Secondary | ICD-10-CM | POA: Diagnosis not present

## 2023-01-11 LAB — CBC
HCT: 37.5 % — ABNORMAL LOW (ref 39.0–52.0)
Hemoglobin: 12.3 g/dL — ABNORMAL LOW (ref 13.0–17.0)
MCH: 31.2 pg (ref 26.0–34.0)
MCHC: 32.8 g/dL (ref 30.0–36.0)
MCV: 95.2 fL (ref 80.0–100.0)
Platelets: 296 10*3/uL (ref 150–400)
RBC: 3.94 MIL/uL — ABNORMAL LOW (ref 4.22–5.81)
RDW: 13.1 % (ref 11.5–15.5)
WBC: 11.3 10*3/uL — ABNORMAL HIGH (ref 4.0–10.5)
nRBC: 0 % (ref 0.0–0.2)

## 2023-01-11 LAB — GLUCOSE, CAPILLARY
Glucose-Capillary: 154 mg/dL — ABNORMAL HIGH (ref 70–99)
Glucose-Capillary: 213 mg/dL — ABNORMAL HIGH (ref 70–99)
Glucose-Capillary: 102 mg/dL — ABNORMAL HIGH (ref 70–99)
Glucose-Capillary: 150 mg/dL — ABNORMAL HIGH (ref 70–99)

## 2023-01-11 MED ORDER — CLINDAMYCIN HCL 300 MG PO CAPS: 300.0000 mg | ORAL_CAPSULE | Freq: Three times a day (TID) | ORAL | 0 refills | Status: AC

## 2023-01-11 MED ORDER — DICLOFENAC SODIUM 1 % EX GEL
2.0000 g | Freq: Four times a day (QID) | CUTANEOUS | Status: DC
  Administered 2023-01-11 – 2023-01-12 (×6): 2 g via TOPICAL
  Filled 2023-01-11: qty 100

## 2023-01-11 MED ORDER — RISAQUAD PO CAPS: 2.0000 | ORAL_CAPSULE | Freq: Three times a day (TID) | ORAL | 0 refills | Status: AC

## 2023-01-11 MED ORDER — OXYCODONE HCL 10 MG PO TABS: 10.0000 mg | ORAL_TABLET | Freq: Four times a day (QID) | ORAL | 0 refills | Status: AC | PRN

## 2023-01-11 NOTE — Discharge Summary (Addendum)
Physician Discharge Summary   Patient: Justin Schmidt MRN: [id number] DOB: October 13, 1980  Admit date:     01/08/2023  Discharge date: 01/12/23  Discharge Physician: Kendell Bane   PCP: Bennie Pierini, FNP   Patient stable for discharge no further changes to this discharge summary    recommendations at discharge:  -Continue currently recommended antibiotics of clindamycin -Follow with PCP in 1-2 weeks -Strict diabetic diet-modification of current medication for tighter blood glucose level -Elevate lower extremities  Discharge Diagnoses: Principal Problem:   Cellulitis Active Problems:   Hyperlipidemia associated with type 2 diabetes mellitus (HCC)   Fatty liver   Depression, recurrent (HCC)   Morbid obesity with BMI of 60.0-69.9, adult (HCC)   Type 2 diabetes mellitus without complication, without long-term current use of insulin (HCC)   History of pulmonary embolus (PE)   History of DVT (deep vein thrombosis)   Essential hypertension  Resolved Problems:   * No resolved hospital problems. *  Hospital Course: Justin Schmidt  is a 42 y.o. male, with morbid obesity, hypertension, hyperlipidemia, diabetes mellitus type 2, DVT/VTE in the past, not anymore on anticoagulation. -Patient presents to ED secondary to complaints of right lower extremity swelling, pain and redness, patient reports it is ongoing for last 5 days, he was given Cipro by his PCP 2 days ago, and had Unna boots, reports pain has worsened, it is pretty painful when he walks around, he has not been pretty active over the last few days, as well he reports fever and chills at home, denies any injury or trauma to foot.  ED: Febrile 100.3, had leukocytosis at 13.9, tach acid was reassuring at 1.7,     Assessment & Plan:     Principal Problem:   Cellulitis Active Problems:   Hyperlipidemia associated with type 2 diabetes mellitus (HCC)   Fatty liver   Depression, recurrent (HCC)   Morbid obesity with BMI of  60.0-69.9, adult (HCC)   Type 2 diabetes mellitus without complication, without long-term current use of insulin (HCC)   History of pulmonary embolus (PE)   History of DVT (deep vein thrombosis)   Essential hypertension     Cellulitis RT lower Extremity  -Hemodynamically stable, afebrile, normotensive Improved leukocytosis,  POA: fever, leukocytosis, edema, pain and warmth, no improvement with oral ciprofloxacin. -Blood cultures>>> no growth to date -Cont:  vancomycin and cefepime >>> switching to IV clindamycin>>> if patient responds, will switch to p.o. clindamycin High risk in the setting of morbid obesity and diabetes mellitus   History of PE and DVT High clinical concern for VTE-recurrent DVT ruled out -H/o DVT 5 years ago,  - Venous Doppler -negative for DVT  - Empirically on heparin GTT for anticoagulation --- continued 01/09/2023, initiating Lovenox subcu for DVT prophylaxis  Hypertension -Blood pressure is elevated, resume home medication and add as needed hydralazine   Type 2 diabetes -Continue checking blood sugar closely, q. ACHS with Isai coverage -on Januvia, will hold, will check A1c CBG (last 3)  Recent Labs    01/09/23 2043 01/10/23 0658 01/10/23 1126  GLUCAP 140* 142* 137*      Hyperlipidemia -Will continue Crestor    Morbid obesity Fatty liver -BMI of 67, I have discussed at length with the patient, to follow with the weight loss clinic as he he would be a good candidate for GLP inhibitors versus surgical weight loss intervention intervention            Consultants: none  Procedures performed: Lower extremity  Doppler study, rule out DVT Disposition: Skilled nursing facility Diet recommendation:  Discharge Diet Orders (From admission, onward)     Start     Ordered   01/11/23 0000  Diet - low sodium heart healthy        01/11/23 1055           Cardiac diet DISCHARGE MEDICATION: Allergies as of 01/12/2023       Reactions    Metformin And Related    diarrhea   Xarelto [rivaroxaban]    DROWSINESS, FATIGUE        Medication List     STOP taking these medications    ciprofloxacin 500 MG tablet Commonly known as: Cipro       TAKE these medications    acetaminophen-codeine 300-30 MG tablet Commonly known as: TYLENOL #3 Take 1 tablet by mouth in the morning and at bedtime.   acidophilus Caps capsule Take 2 capsules by mouth 3 (three) times daily for 10 days.   aspirin EC 81 MG tablet Take 81 mg by mouth every other day.   clindamycin 300 MG capsule Commonly known as: CLEOCIN Take 1 capsule (300 mg total) by mouth 3 (three) times daily for 7 days.   diclofenac 75 MG EC tablet Commonly known as: VOLTAREN Take 1 tablet (75 mg total) by mouth 2 (two) times daily. For foot pain   diclofenac Sodium 1 % Gel Commonly known as: VOLTAREN Apply 2 g topically 4 (four) times daily.   escitalopram 10 MG tablet Commonly known as: LEXAPRO Take 1 tablet (10 mg total) by mouth daily.   FLAXSEED OIL PO Take 1 capsule by mouth daily.   fluticasone 50 MCG/ACT nasal spray Commonly known as: FLONASE SPRAY 2 SPRAYS INTO EACH NOSTRIL EVERY DAY   furosemide 20 MG tablet Commonly known as: LASIX Take 1 tablet (20 mg total) by mouth daily.   GARLIC PO Take by mouth.   lisinopril 10 MG tablet Commonly known as: ZESTRIL Take 1 tablet (10 mg total) by mouth daily.   multivitamin with minerals Tabs tablet Take 1 tablet by mouth daily.   Oxycodone HCl 10 MG Tabs Take 1 tablet (10 mg total) by mouth every 6 (six) hours as needed for up to 3 days for severe pain.   rosuvastatin 5 MG tablet Commonly known as: CRESTOR Take 1 tablet (5 mg total) by mouth daily.   sitaGLIPtin 50 MG tablet Commonly known as: Januvia Take 1 tablet (50 mg total) by mouth daily.   vitamin C 1000 MG tablet Take 1,000 mg by mouth daily.   Vitamin D 50 MCG (2000 UT) tablet Take 2,000 Units by mouth daily.   ZINC ACETATE  PO Take 1 tablet by mouth daily.        Contact information for after-discharge care     Destination     [facility] Preferred SNF .   Service: Skilled Nursing Contact information: [address] [place] [address] [phone]                    Discharge Exam: Ceasar Mons Weights   01/08/23 1413 01/08/23 1808  Weight: (!) 204.1 kg (!) 206.4 kg        General:  AAO x 3,  cooperative, no distress;   HEENT:  Normocephalic, PERRL, otherwise with in Normal limits   Neuro:  CNII-XII intact. , normal motor and sensation, reflexes intact   Lungs:   Clear to auscultation BL, Respirations unlabored,  No wheezes /  crackles  Cardio:    S1/S2, RRR, No murmure, No Rubs or Gallops   Abdomen:  Soft, non-tender, bowel sounds active all four quadrants, no guarding or peritoneal signs.  Muscular  skeletal:  Right lower extremity erythema edema improving  limited exam -global generalized weaknesses - in bed, able to move all 4 extremities,   2+ pulses,  symmetric, +1  pitting edema right> left  Skin:  Dry, warm to touch, bilateral lower extremity erythema and edema right a lot worse than left, below the knee below the ankle area  Wounds: Please see nursing documentation          Condition at discharge: good  The results of significant diagnostics from this hospitalization (including imaging, microbiology, ancillary and laboratory) are listed below for reference.   Imaging Studies: US Venous Img Lower Unilateral Right (DVT)  Result Date: 01/09/2023 CLINICAL DATA:  Right calf cellulitis, redness, edema, pain for 3 days EXAM: RIGHT LOWER EXTREMITY VENOUS DOPPLER ULTRASOUND TECHNIQUE: Gray-scale sonography with compression, as well as color and duplex ultrasound, were performed to evaluate the deep venous system(s) from the level of the common femoral vein through the popliteal and proximal calf veins. COMPARISON:  None available FINDINGS: VENOUS Normal  compressibility of the common femoral, superficial femoral, and popliteal veins, as well as the visualized calf veins. Visualized portions of profunda femoral vein and great saphenous vein unremarkable. No filling defects to suggest DVT on grayscale or color Doppler imaging. Doppler waveforms show normal direction of venous flow, normal respiratory plasticity and response to augmentation. Limited views of the contralateral common femoral vein are unremarkable. OTHER None. Limitations: Right calf veins not well visualized. IMPRESSION: No right lower extremity DVT. Electronically Signed   By: Acquanetta Belling M.D.   On: 01/09/2023 10:22   DG Tibia/Fibula Right  Result Date: 01/08/2023 CLINICAL DATA:  Questionable sepsis. Bilateral lower extremity cellulitis. Increased right lower extremity pain. EXAM: RIGHT TIBIA AND FIBULA - 2 VIEW COMPARISON:  None Available. FINDINGS: There is no acute fracture or dislocation. The bones are well mineralized. There is diffuse subcutaneous edema. No radiopaque foreign object or soft tissue gas. IMPRESSION: 1. No acute fracture or dislocation. 2. Diffuse subcutaneous edema. Electronically Signed   By: Elgie Collard M.D.   On: 01/08/2023 19:47   DG Chest Port 1 View  Result Date: 01/08/2023 CLINICAL DATA:  Questionable sepsis. EXAM: PORTABLE CHEST 1 VIEW COMPARISON:  Chest radiograph dated 03/14/2017. FINDINGS: No focal consolidation, pleural effusion or pneumothorax. Stable cardiac silhouette. No acute osseous pathology. IMPRESSION: No active disease. Electronically Signed   By: Elgie Collard M.D.   On: 01/08/2023 19:46    Microbiology: Results for orders placed or performed during the hospital encounter of 01/08/23  Blood Culture (routine x 2)     Status: None (Preliminary result)   Collection Time: 01/08/23  3:02 PM   Specimen: Right Antecubital; Blood  Result Value Ref Range Status   Specimen Description RIGHT ANTECUBITAL  Final   Special Requests   Final     BOTTLES DRAWN AEROBIC AND ANAEROBIC Blood Culture adequate volume   Culture   Final    NO GROWTH 4 DAYS Performed at [facility], [address], [place], [place] [address]    Report Status PENDING  Incomplete  Resp panel by RT-PCR (RSV, Flu A&B, Covid) Anterior Nasal Swab     Status: None   Collection Time: 01/08/23  5:42 PM   Specimen: Anterior Nasal Swab  Result Value Ref Range Status  SARS Coronavirus 2 by RT PCR NEGATIVE NEGATIVE Final    Comment: (NOTE) SARS-CoV-2 target nucleic acids are NOT DETECTED.  The SARS-CoV-2 RNA is generally detectable in upper respiratory specimens during the acute phase of infection. The lowest concentration of SARS-CoV-2 viral copies this assay can detect is 138 copies/mL. A negative result does not preclude SARS-Cov-2 infection and should not be used as the sole basis for treatment or other patient management decisions. A negative result may occur with  improper specimen collection/handling, submission of specimen other than nasopharyngeal swab, presence of viral mutation(s) within the areas targeted by this assay, and inadequate number of viral copies(<138 copies/mL). A negative result must be combined with clinical observations, patient history, and epidemiological information. The expected result is Negative.  Fact Sheet for Patients:  BloggerCourse.com  Fact Sheet for Healthcare Providers:  SeriousBroker.it  This test is no t yet approved or cleared by the [place] FDA and  has been authorized for detection and/or diagnosis of SARS-CoV-2 by FDA under an Emergency Use Authorization (EUA). This EUA will remain  in effect (meaning this test can be used) for the duration of the COVID-19 declaration under Section 564(b)(1) of the Act, 21 U.S.C.section 360bbb-3(b)(1), unless the authorization is terminated  or revoked sooner.       Influenza A by PCR NEGATIVE NEGATIVE Final    Influenza B by PCR NEGATIVE NEGATIVE Final    Comment: (NOTE) The Xpert Xpress SARS-CoV-2/FLU/RSV plus assay is intended as an aid in the diagnosis of influenza from Nasopharyngeal swab specimens and should not be used as a sole basis for treatment. Nasal washings and aspirates are unacceptable for Xpert Xpress SARS-CoV-2/FLU/RSV testing.  Fact Sheet for Patients: BloggerCourse.com  Fact Sheet for Healthcare Providers: SeriousBroker.it  This test is not yet approved or cleared by the [place] FDA and has been authorized for detection and/or diagnosis of SARS-CoV-2 by FDA under an Emergency Use Authorization (EUA). This EUA will remain in effect (meaning this test can be used) for the duration of the COVID-19 declaration under Section 564(b)(1) of the Act, 21 U.S.C. section 360bbb-3(b)(1), unless the authorization is terminated or revoked.     Resp Syncytial Virus by PCR NEGATIVE NEGATIVE Final    Comment: (NOTE) Fact Sheet for Patients: BloggerCourse.com  Fact Sheet for Healthcare Providers: SeriousBroker.it  This test is not yet approved or cleared by the [place] FDA and has been authorized for detection and/or diagnosis of SARS-CoV-2 by FDA under an Emergency Use Authorization (EUA). This EUA will remain in effect (meaning this test can be used) for the duration of the COVID-19 declaration under Section 564(b)(1) of the Act, 21 U.S.C. section 360bbb-3(b)(1), unless the authorization is terminated or revoked.  Performed at [facility], [address] [place], [place] [address]   Blood Culture (routine x 2)     Status: None (Preliminary result)   Collection Time: 01/08/23  5:42 PM   Specimen: BLOOD  Result Value Ref Range Status   Specimen Description BLOOD BLOOD LEFT ARM  Final   Special Requests   Final    BOTTLES DRAWN AEROBIC AND ANAEROBIC Blood Culture  results may not be optimal due to an excessive volume of blood received in culture bottles   Culture   Final    NO GROWTH 4 DAYS Performed at [facility], [address], [place], [place] [address]    Report Status PENDING  Incomplete    Labs: CBC: Recent Labs  Lab 01/08/23 1502 01/09/23 0204 01/10/23  0423 01/11/23 0416 01/12/23 0411  WBC 13.9* 11.5* 11.5* 11.3* 10.9*  NEUTROABS 11.1*  --   --   --   --   HGB 13.4 12.3* 11.7* 12.3* 12.2*  HCT 40.3 36.7* 35.9* 37.5* 36.2*  MCV 93.9 93.6 95.7 95.2 93.3  PLT 273 263 273 296 350   Basic Metabolic Panel: Recent Labs  Lab 01/08/23 1502 01/09/23 0204  NA 135 133*  K 3.6 3.3*  CL 98 98  CO2 26 24  GLUCOSE 145* 192*  BUN 15 14  CREATININE 1.19 1.18  CALCIUM 8.7* 8.2*   Liver Function Tests: Recent Labs  Lab 01/08/23 1502 01/09/23 0204  AST 17 16  ALT 32 28  ALKPHOS 51 45  BILITOT 1.2 0.9  PROT 8.1 7.3  ALBUMIN 3.7 3.2*   CBG: Recent Labs  Lab 01/11/23 0737 01/11/23 1107 01/11/23 1629 01/11/23 2008 01/12/23 0720  GLUCAP 154* 213* 102* 150* 132*    Discharge time spent: greater than 30 minutes.  Signed: Kendell Bane, MD [facility] 01/12/2023

## 2023-01-11 NOTE — TOC Progression Note (Signed)
Transition of Care [facility]) - Progression Note    Patient Details  Name: Justin Schmidt MRN: [id number] Date of Birth: [birth year]  Transition of Care [facility]) CM/SW Contact  Villa Herb, [place] Phone Number: 01/11/2023, 10:28 AM  Clinical Narrative:    CSW spoke with pt to review bed offer at at [facility], pt accepts bed. CSW to update insurance auth to reflect this bed choice. Insurance Berkley Harvey is still pending at this time. TOC to follow.   Expected Discharge Plan: Skilled Nursing Facility Barriers to Discharge: Continued Medical Work up  Expected Discharge Plan and Services       Living arrangements for the past 2 months: Single Family Home                                       Social Determinants of Health (SDOH) Interventions SDOH Screenings   Food Insecurity: No Food Insecurity (01/08/2023)  Housing: Low Risk  (01/08/2023)  Transportation Needs: No Transportation Needs (01/08/2023)  Utilities: Not At Risk (01/08/2023)  Alcohol Screen: Low Risk  (12/18/2022)  Depression (PHQ2-9): Low Risk  (01/06/2023)  Financial Resource Strain: Low Risk  (12/18/2022)  Physical Activity: Insufficiently Active (12/18/2022)  Social Connections: Socially Isolated (12/18/2022)  Stress: No Stress Concern Present (12/18/2022)  Tobacco Use: Low Risk  (01/08/2023)  Health Literacy: Adequate Health Literacy (12/18/2022)    Readmission Risk Interventions    01/09/2023    1:51 PM  Readmission Risk Prevention Plan  Post Dischage Appt Not Complete  Medication Screening Complete  Transportation Screening Complete

## 2023-01-12 ENCOUNTER — Other Ambulatory Visit: Payer: Self-pay | Admitting: *Deleted

## 2023-01-12 DIAGNOSIS — G8929 Other chronic pain: Secondary | ICD-10-CM

## 2023-01-12 DIAGNOSIS — L0390 Cellulitis, unspecified: Secondary | ICD-10-CM | POA: Diagnosis not present

## 2023-01-12 DIAGNOSIS — M79604 Pain in right leg: Secondary | ICD-10-CM | POA: Diagnosis not present

## 2023-01-12 DIAGNOSIS — R5381 Other malaise: Secondary | ICD-10-CM | POA: Diagnosis not present

## 2023-01-12 DIAGNOSIS — F339 Major depressive disorder, recurrent, unspecified: Secondary | ICD-10-CM

## 2023-01-12 DIAGNOSIS — D649 Anemia, unspecified: Secondary | ICD-10-CM | POA: Diagnosis not present

## 2023-01-12 DIAGNOSIS — K760 Fatty (change of) liver, not elsewhere classified: Secondary | ICD-10-CM | POA: Diagnosis not present

## 2023-01-12 DIAGNOSIS — I10 Essential (primary) hypertension: Secondary | ICD-10-CM | POA: Diagnosis not present

## 2023-01-12 DIAGNOSIS — L03115 Cellulitis of right lower limb: Secondary | ICD-10-CM | POA: Diagnosis not present

## 2023-01-12 DIAGNOSIS — R531 Weakness: Secondary | ICD-10-CM | POA: Diagnosis not present

## 2023-01-12 DIAGNOSIS — Z743 Need for continuous supervision: Secondary | ICD-10-CM | POA: Diagnosis not present

## 2023-01-12 DIAGNOSIS — M545 Low back pain, unspecified: Secondary | ICD-10-CM

## 2023-01-12 DIAGNOSIS — Z7401 Bed confinement status: Secondary | ICD-10-CM | POA: Diagnosis not present

## 2023-01-12 DIAGNOSIS — R609 Edema, unspecified: Secondary | ICD-10-CM

## 2023-01-12 DIAGNOSIS — E119 Type 2 diabetes mellitus without complications: Secondary | ICD-10-CM

## 2023-01-12 DIAGNOSIS — E781 Pure hyperglyceridemia: Secondary | ICD-10-CM | POA: Diagnosis not present

## 2023-01-12 DIAGNOSIS — E8809 Other disorders of plasma-protein metabolism, not elsewhere classified: Secondary | ICD-10-CM | POA: Diagnosis not present

## 2023-01-12 DIAGNOSIS — N181 Chronic kidney disease, stage 1: Secondary | ICD-10-CM | POA: Diagnosis not present

## 2023-01-12 DIAGNOSIS — E559 Vitamin D deficiency, unspecified: Secondary | ICD-10-CM | POA: Diagnosis not present

## 2023-01-12 DIAGNOSIS — J302 Other seasonal allergic rhinitis: Secondary | ICD-10-CM | POA: Diagnosis not present

## 2023-01-12 DIAGNOSIS — Z86711 Personal history of pulmonary embolism: Secondary | ICD-10-CM | POA: Diagnosis not present

## 2023-01-12 DIAGNOSIS — Z86718 Personal history of other venous thrombosis and embolism: Secondary | ICD-10-CM | POA: Diagnosis not present

## 2023-01-12 DIAGNOSIS — R6889 Other general symptoms and signs: Secondary | ICD-10-CM | POA: Diagnosis not present

## 2023-01-12 DIAGNOSIS — E785 Hyperlipidemia, unspecified: Secondary | ICD-10-CM | POA: Diagnosis not present

## 2023-01-12 DIAGNOSIS — E1140 Type 2 diabetes mellitus with diabetic neuropathy, unspecified: Secondary | ICD-10-CM | POA: Diagnosis not present

## 2023-01-12 DIAGNOSIS — R262 Difficulty in walking, not elsewhere classified: Secondary | ICD-10-CM | POA: Diagnosis not present

## 2023-01-12 DIAGNOSIS — R279 Unspecified lack of coordination: Secondary | ICD-10-CM | POA: Diagnosis not present

## 2023-01-12 DIAGNOSIS — M6281 Muscle weakness (generalized): Secondary | ICD-10-CM | POA: Diagnosis not present

## 2023-01-12 DIAGNOSIS — I739 Peripheral vascular disease, unspecified: Secondary | ICD-10-CM | POA: Diagnosis not present

## 2023-01-12 LAB — CBC
HCT: 36.2 % — ABNORMAL LOW (ref 39.0–52.0)
Hemoglobin: 12.2 g/dL — ABNORMAL LOW (ref 13.0–17.0)
MCH: 31.4 pg (ref 26.0–34.0)
MCHC: 33.7 g/dL (ref 30.0–36.0)
MCV: 93.3 fL (ref 80.0–100.0)
Platelets: 350 10*3/uL (ref 150–400)
RBC: 3.88 MIL/uL — ABNORMAL LOW (ref 4.22–5.81)
RDW: 13.3 % (ref 11.5–15.5)
WBC: 10.9 10*3/uL — ABNORMAL HIGH (ref 4.0–10.5)
nRBC: 0 % (ref 0.0–0.2)

## 2023-01-12 LAB — GLUCOSE, CAPILLARY
Glucose-Capillary: 132 mg/dL — ABNORMAL HIGH (ref 70–99)
Glucose-Capillary: 157 mg/dL — ABNORMAL HIGH (ref 70–99)

## 2023-01-12 MED ORDER — ROSUVASTATIN CALCIUM 5 MG PO TABS
5.0000 mg | ORAL_TABLET | Freq: Every day | ORAL | 0 refills | Status: DC
Start: 2023-01-12 — End: 2023-03-20

## 2023-01-12 MED ORDER — DICLOFENAC SODIUM 75 MG PO TBEC
75.0000 mg | DELAYED_RELEASE_TABLET | Freq: Two times a day (BID) | ORAL | 0 refills | Status: AC
Start: 2023-01-12 — End: 2023-02-11

## 2023-01-12 MED ORDER — DICLOFENAC SODIUM 75 MG PO TBEC: 75.0000 mg | DELAYED_RELEASE_TABLET | Freq: Two times a day (BID) | ORAL | 0 refills | Status: DC

## 2023-01-12 MED ORDER — DICLOFENAC SODIUM 50 MG PO TBEC
50.0000 mg | DELAYED_RELEASE_TABLET | Freq: Two times a day (BID) | ORAL | Status: DC
  Administered 2023-01-12: 50 mg via ORAL
  Filled 2023-01-12 (×5): qty 1

## 2023-01-12 MED ORDER — SITAGLIPTIN PHOSPHATE 50 MG PO TABS: 50.0000 mg | ORAL_TABLET | Freq: Every day | ORAL | 0 refills | Status: DC

## 2023-01-12 MED ORDER — FUROSEMIDE 20 MG PO TABS
20.0000 mg | ORAL_TABLET | Freq: Every day | ORAL | 0 refills | Status: DC
Start: 2023-01-12 — End: 2023-03-20

## 2023-01-12 MED ORDER — LISINOPRIL 10 MG PO TABS: 10.0000 mg | ORAL_TABLET | Freq: Every day | ORAL | 0 refills | Status: DC

## 2023-01-12 MED ORDER — DICLOFENAC SODIUM 1 % EX GEL: 2.0000 g | Freq: Four times a day (QID) | CUTANEOUS | 1 refills | Status: AC

## 2023-01-12 MED ORDER — ESCITALOPRAM OXALATE 10 MG PO TABS
10.0000 mg | ORAL_TABLET | Freq: Every day | ORAL | 0 refills | Status: DC
Start: 2023-01-12 — End: 2023-03-20

## 2023-01-12 NOTE — Progress Notes (Signed)
Physician Discharge Summary   Patient: Justin Schmidt MRN: [id number] DOB: [birth year]  Admit date:     01/08/2023  Discharge date: 01/12/23  Discharge Physician: Kendell Bane   PCP: Bennie Pierini, FNP    The patient was seen and examined, stable for discharge to SNF    recommendations at discharge:  -Continue currently recommended antibiotics of clindamycin -Follow with PCP in 1-2 weeks -Strict diabetic diet-modification of current medication for tighter blood glucose level -Elevate lower extremities  Discharge Diagnoses: Principal Problem:   Cellulitis Active Problems:   Hyperlipidemia associated with type 2 diabetes mellitus (HCC)   Fatty liver   Depression, recurrent (HCC)   Morbid obesity with BMI of 60.0-69.9, adult (HCC)   Type 2 diabetes mellitus without complication, without long-term current use of insulin (HCC)   History of pulmonary embolus (PE)   History of DVT (deep vein thrombosis)   Essential hypertension  Resolved Problems:   * No resolved hospital problems. *  Hospital Course: [name]  is a 42 y.o. male, with morbid obesity, hypertension, hyperlipidemia, diabetes mellitus type 2, DVT/VTE in the past, not anymore on anticoagulation. -Patient presents to ED secondary to complaints of right lower extremity swelling, pain and redness, patient reports it is ongoing for last 5 days, he was given Cipro by his PCP 2 days ago, and had Unna boots, reports pain has worsened, it is pretty painful when he walks around, he has not been pretty active over the last few days, as well he reports fever and chills at home, denies any injury or trauma to foot.  ED: Febrile 100.3, had leukocytosis at 13.9, tach acid was reassuring at 1.7,     Assessment & Plan:     Principal Problem:   Cellulitis Active Problems:   Hyperlipidemia associated with type 2 diabetes mellitus (HCC)   Fatty liver   Depression, recurrent (HCC)   Morbid obesity with BMI of  60.0-69.9, adult (HCC)   Type 2 diabetes mellitus without complication, without long-term current use of insulin (HCC)   History of pulmonary embolus (PE)   History of DVT (deep vein thrombosis)   Essential hypertension     Cellulitis RT lower Extremity  -Hemodynamically stable, afebrile, normotensive Improved leukocytosis,  POA: fever, leukocytosis, edema, pain and warmth, no improvement with oral ciprofloxacin. -Blood cultures>>> no growth to date -Cont:  vancomycin and cefepime >>> switching to IV clindamycin>>> if patient responds, will switch to p.o. clindamycin High risk in the setting of morbid obesity and diabetes mellitus   History of PE and DVT High clinical concern for VTE-recurrent DVT ruled out -H/o DVT 5 years ago,  - Venous Doppler -negative for DVT  - Empirically on heparin GTT for anticoagulation --- continued 01/09/2023, initiating Lovenox subcu for DVT prophylaxis  Hypertension -Blood pressure is elevated, resume home medication and add as needed hydralazine   Type 2 diabetes -Continue checking blood sugar closely, q. ACHS with Isai coverage -on Januvia, will hold, will check A1c CBG (last 3)  Recent Labs    01/09/23 2043 01/10/23 0658 01/10/23 1126  GLUCAP 140* 142* 137*      Hyperlipidemia -Will continue Crestor    Morbid obesity Fatty liver -BMI of 67, I have discussed at length with the patient, to follow with the weight loss clinic as he he would be a good candidate for GLP inhibitors versus surgical weight loss intervention intervention            Consultants: none  Procedures performed: Lower  extremity Doppler study, rule out DVT Disposition: Skilled nursing facility Diet recommendation:  Discharge Diet Orders (From admission, onward)     Start     Ordered   01/11/23 0000  Diet - low sodium heart healthy        01/11/23 1055           Cardiac diet DISCHARGE MEDICATION: Allergies as of 01/12/2023       Reactions    Metformin And Related    diarrhea   Xarelto [rivaroxaban]    DROWSINESS, FATIGUE        Medication List     STOP taking these medications    ciprofloxacin 500 MG tablet Commonly known as: Cipro       TAKE these medications    acetaminophen-codeine 300-30 MG tablet Commonly known as: TYLENOL #3 Take 1 tablet by mouth in the morning and at bedtime.   acidophilus Caps capsule Take 2 capsules by mouth 3 (three) times daily for 10 days.   aspirin EC 81 MG tablet Take 81 mg by mouth every other day.   clindamycin 300 MG capsule Commonly known as: CLEOCIN Take 1 capsule (300 mg total) by mouth 3 (three) times daily for 7 days.   diclofenac 75 MG EC tablet Commonly known as: VOLTAREN Take 1 tablet (75 mg total) by mouth 2 (two) times daily. For foot pain   diclofenac Sodium 1 % Gel Commonly known as: VOLTAREN Apply 2 g topically 4 (four) times daily.   escitalopram 10 MG tablet Commonly known as: LEXAPRO Take 1 tablet (10 mg total) by mouth daily.   FLAXSEED OIL PO Take 1 capsule by mouth daily.   fluticasone 50 MCG/ACT nasal spray Commonly known as: FLONASE SPRAY 2 SPRAYS INTO EACH NOSTRIL EVERY DAY   furosemide 20 MG tablet Commonly known as: LASIX Take 1 tablet (20 mg total) by mouth daily.   GARLIC PO Take by mouth.   lisinopril 10 MG tablet Commonly known as: ZESTRIL Take 1 tablet (10 mg total) by mouth daily.   multivitamin with minerals Tabs tablet Take 1 tablet by mouth daily.   Oxycodone HCl 10 MG Tabs Take 1 tablet (10 mg total) by mouth every 6 (six) hours as needed for up to 3 days for severe pain.   rosuvastatin 5 MG tablet Commonly known as: CRESTOR Take 1 tablet (5 mg total) by mouth daily.   sitaGLIPtin 50 MG tablet Commonly known as: Januvia Take 1 tablet (50 mg total) by mouth daily.   vitamin C 1000 MG tablet Take 1,000 mg by mouth daily.   Vitamin D 50 MCG (2000 UT) tablet Take 2,000 Units by mouth daily.   ZINC ACETATE  PO Take 1 tablet by mouth daily.        Contact information for after-discharge care     Destination     [facility] Preferred SNF .   Service: Skilled Nursing Contact information: [address] [place] [address] [phone]                    Discharge Exam: Ceasar Mons Weights   01/08/23 1413 01/08/23 1808  Weight: (!) 204.1 kg (!) 206.4 kg         General:  AAO x 3,  cooperative, no distress;   HEENT:  Normocephalic, PERRL, otherwise with in Normal limits   Neuro:  CNII-XII intact. , normal motor and sensation, reflexes intact   Lungs:   Clear to auscultation BL, Respirations unlabored,  No wheezes / crackles  Cardio:    S1/S2, RRR, No murmure, No Rubs or Gallops   Abdomen:  Soft, non-tender, bowel sounds active all four quadrants, no guarding or peritoneal signs.  Muscular  skeletal:  Limited exam -severe global generalized weaknesses - in bed, able to move all 4 extremities,   2+ pulses,  symmetric, No pitting edema  Skin:  Dry, warm to touch, bilateral lower extremity erythema edema right greater than left, improving  Wounds: Please see nursing documentation              Condition at discharge: good  The results of significant diagnostics from this hospitalization (including imaging, microbiology, ancillary and laboratory) are listed below for reference.   Imaging Studies: US Venous Img Lower Unilateral Right (DVT)  Result Date: 01/09/2023 CLINICAL DATA:  Right calf cellulitis, redness, edema, pain for 3 days EXAM: RIGHT LOWER EXTREMITY VENOUS DOPPLER ULTRASOUND TECHNIQUE: Gray-scale sonography with compression, as well as color and duplex ultrasound, were performed to evaluate the deep venous system(s) from the level of the common femoral vein through the popliteal and proximal calf veins. COMPARISON:  None available FINDINGS: VENOUS Normal compressibility of the common femoral, superficial femoral, and popliteal  veins, as well as the visualized calf veins. Visualized portions of profunda femoral vein and great saphenous vein unremarkable. No filling defects to suggest DVT on grayscale or color Doppler imaging. Doppler waveforms show normal direction of venous flow, normal respiratory plasticity and response to augmentation. Limited views of the contralateral common femoral vein are unremarkable. OTHER None. Limitations: Right calf veins not well visualized. IMPRESSION: No right lower extremity DVT. Electronically Signed   By: Acquanetta Belling M.D.   On: 01/09/2023 10:22   DG Tibia/Fibula Right  Result Date: 01/08/2023 CLINICAL DATA:  Questionable sepsis. Bilateral lower extremity cellulitis. Increased right lower extremity pain. EXAM: RIGHT TIBIA AND FIBULA - 2 VIEW COMPARISON:  None Available. FINDINGS: There is no acute fracture or dislocation. The bones are well mineralized. There is diffuse subcutaneous edema. No radiopaque foreign object or soft tissue gas. IMPRESSION: 1. No acute fracture or dislocation. 2. Diffuse subcutaneous edema. Electronically Signed   By: Elgie Collard M.D.   On: 01/08/2023 19:47   DG Chest Port 1 View  Result Date: 01/08/2023 CLINICAL DATA:  Questionable sepsis. EXAM: PORTABLE CHEST 1 VIEW COMPARISON:  Chest radiograph dated 03/14/2017. FINDINGS: No focal consolidation, pleural effusion or pneumothorax. Stable cardiac silhouette. No acute osseous pathology. IMPRESSION: No active disease. Electronically Signed   By: Elgie Collard M.D.   On: 01/08/2023 19:46    Microbiology: Results for orders placed or performed during the hospital encounter of 01/08/23  Blood Culture (routine x 2)     Status: None (Preliminary result)   Collection Time: 01/08/23  3:02 PM   Specimen: Right Antecubital; Blood  Result Value Ref Range Status   Specimen Description RIGHT ANTECUBITAL  Final   Special Requests   Final    BOTTLES DRAWN AEROBIC AND ANAEROBIC Blood Culture adequate volume   Culture    Final    NO GROWTH 4 DAYS Performed at [facility], [address] [place], [place] [address]    Report Status PENDING  Incomplete  Resp panel by RT-PCR (RSV, Flu A&B, Covid) Anterior Nasal Swab     Status: None   Collection Time: 01/08/23  5:42 PM   Specimen: Anterior Nasal Swab  Result Value Ref Range Status   SARS Coronavirus 2 by RT PCR NEGATIVE NEGATIVE Final  Comment: (NOTE) SARS-CoV-2 target nucleic acids are NOT DETECTED.  The SARS-CoV-2 RNA is generally detectable in upper respiratory specimens during the acute phase of infection. The lowest concentration of SARS-CoV-2 viral copies this assay can detect is 138 copies/mL. A negative result does not preclude SARS-Cov-2 infection and should not be used as the sole basis for treatment or other patient management decisions. A negative result may occur with  improper specimen collection/handling, submission of specimen other than nasopharyngeal swab, presence of viral mutation(s) within the areas targeted by this assay, and inadequate number of viral copies(<138 copies/mL). A negative result must be combined with clinical observations, patient history, and epidemiological information. The expected result is Negative.  Fact Sheet for Patients:  BloggerCourse.com  Fact Sheet for Healthcare Providers:  SeriousBroker.it  This test is no t yet approved or cleared by the [place] FDA and  has been authorized for detection and/or diagnosis of SARS-CoV-2 by FDA under an Emergency Use Authorization (EUA). This EUA will remain  in effect (meaning this test can be used) for the duration of the COVID-19 declaration under Section 564(b)(1) of the Act, 21 U.S.C.section 360bbb-3(b)(1), unless the authorization is terminated  or revoked sooner.       Influenza A by PCR NEGATIVE NEGATIVE Final   Influenza B by PCR NEGATIVE NEGATIVE Final    Comment: (NOTE) The Xpert Xpress  SARS-CoV-2/FLU/RSV plus assay is intended as an aid in the diagnosis of influenza from Nasopharyngeal swab specimens and should not be used as a sole basis for treatment. Nasal washings and aspirates are unacceptable for Xpert Xpress SARS-CoV-2/FLU/RSV testing.  Fact Sheet for Patients: BloggerCourse.com  Fact Sheet for Healthcare Providers: SeriousBroker.it  This test is not yet approved or cleared by the [place] FDA and has been authorized for detection and/or diagnosis of SARS-CoV-2 by FDA under an Emergency Use Authorization (EUA). This EUA will remain in effect (meaning this test can be used) for the duration of the COVID-19 declaration under Section 564(b)(1) of the Act, 21 U.S.C. section 360bbb-3(b)(1), unless the authorization is terminated or revoked.     Resp Syncytial Virus by PCR NEGATIVE NEGATIVE Final    Comment: (NOTE) Fact Sheet for Patients: BloggerCourse.com  Fact Sheet for Healthcare Providers: SeriousBroker.it  This test is not yet approved or cleared by the [place] FDA and has been authorized for detection and/or diagnosis of SARS-CoV-2 by FDA under an Emergency Use Authorization (EUA). This EUA will remain in effect (meaning this test can be used) for the duration of the COVID-19 declaration under Section 564(b)(1) of the Act, 21 U.S.C. section 360bbb-3(b)(1), unless the authorization is terminated or revoked.  Performed at [facility], [address] [place], [place] [address]   Blood Culture (routine x 2)     Status: None (Preliminary result)   Collection Time: 01/08/23  5:42 PM   Specimen: BLOOD  Result Value Ref Range Status   Specimen Description BLOOD BLOOD LEFT ARM  Final   Special Requests   Final    BOTTLES DRAWN AEROBIC AND ANAEROBIC Blood Culture results may not be optimal due to an excessive volume of blood received in culture  bottles   Culture   Final    NO GROWTH 4 DAYS Performed at [facility], [address], [place], [place] [address]    Report Status PENDING  Incomplete    Labs: CBC: Recent Labs  Lab 01/08/23 1502 01/09/23 0204 01/10/23 0423 01/11/23 0416 01/12/23 0411  WBC 13.9* 11.5* 11.5* 11.3* 10.9*  NEUTROABS 11.1*  --   --   --   --   HGB 13.4 12.3* 11.7* 12.3* 12.2*  HCT 40.3 36.7* 35.9* 37.5* 36.2*  MCV 93.9 93.6 95.7 95.2 93.3  PLT 273 263 273 296 350   Basic Metabolic Panel: Recent Labs  Lab 01/08/23 1502 01/09/23 0204  NA 135 133*  K 3.6 3.3*  CL 98 98  CO2 26 24  GLUCOSE 145* 192*  BUN 15 14  CREATININE 1.19 1.18  CALCIUM 8.7* 8.2*   Liver Function Tests: Recent Labs  Lab 01/08/23 1502 01/09/23 0204  AST 17 16  ALT 32 28  ALKPHOS 51 45  BILITOT 1.2 0.9  PROT 8.1 7.3  ALBUMIN 3.7 3.2*   CBG: Recent Labs  Lab 01/11/23 0737 01/11/23 1107 01/11/23 1629 01/11/23 2008 01/12/23 0720  GLUCAP 154* 213* 102* 150* 132*    Discharge time spent: greater than 30 minutes.  Signed: Kendell Bane, MD [facility] 01/12/2023

## 2023-01-12 NOTE — Addendum Note (Signed)
Addended by: Julious Payer D on: 01/12/2023 03:21 PM   Modules accepted: Orders

## 2023-01-12 NOTE — Care Management Important Message (Signed)
Important Message  Patient Details  Name: Justin Schmidt MRN: [id number] Date of Birth: [birth year]   Medicare Important Message Given:  Yes     Corey Harold 01/12/2023, 12:01 PM

## 2023-01-12 NOTE — TOC Transition Note (Signed)
Transition of Care [facility]) - CM/SW Discharge Note   Patient Details  Name: Justin Schmidt MRN: [id number] Date of Birth: 06-05-1980  Transition of Care [facility]) CM/SW Contact:  Leitha Bleak, RN Phone Number: 01/12/2023, 10:56 AM   Clinical Narrative:   Insurance auth received. [facility] provided room number. RN will call report, Medical necessity printed, TOC will call EMS when RN is ready. Medical necessity printed, Patient will update his family. Clinicals sent in the hub.   Final next level of care: Skilled Nursing Facility Barriers to Discharge: Barriers Resolved   Patient Goals and CMS Choice CMS Medicare.gov Compare Post Acute Care list provided to:: Patient Choice offered to / list presented to : Patient  Discharge Placement                Patient to be transferred to facility by: EMS   Patient and family notified of of transfer: 01/12/23  Discharge Plan and Services Additional resources added to the After Visit Summary for         Social Determinants of Health (SDOH) Interventions SDOH Screenings   Food Insecurity: No Food Insecurity (01/08/2023)  Housing: Low Risk  (01/08/2023)  Transportation Needs: No Transportation Needs (01/08/2023)  Utilities: Not At Risk (01/08/2023)  Alcohol Screen: Low Risk  (12/18/2022)  Depression (PHQ2-9): Low Risk  (01/06/2023)  Financial Resource Strain: Low Risk  (12/18/2022)  Physical Activity: Insufficiently Active (12/18/2022)  Social Connections: Socially Isolated (12/18/2022)  Stress: No Stress Concern Present (12/18/2022)  Tobacco Use: Low Risk  (01/08/2023)  Health Literacy: Adequate Health Literacy (12/18/2022)    Readmission Risk Interventions    01/09/2023    1:51 PM  Readmission Risk Prevention Plan  Post Dischage Appt Not Complete  Medication Screening Complete  Transportation Screening Complete

## 2023-01-12 NOTE — Progress Notes (Signed)
Mobility Specialist Progress Note:    01/12/23 1100  Mobility  Activity Transferred to/from [facility]  Level of Assistance Minimal assist, patient does 75% or more  Assistive Device Front wheel walker  Distance Ambulated (ft) 2 ft  Range of Motion/Exercises Active;All extremities  Activity Response Tolerated well  Mobility Referral Yes  $Mobility charge 1 Mobility  Mobility Specialist Start Time (ACUTE ONLY) 1100  Mobility Specialist Stop Time (ACUTE ONLY) 1115  Mobility Specialist Time Calculation (min) (ACUTE ONLY) 15 min   Pt received in bed, agreeable to mobility. Required MInA to sit EOB, requested assistance to [facility]. CGA to pivot to [facility] with RW. Tolerated well, pt c/o pain and sensitivity on feet. Left pt on BSC, NT in room. All needs met.    Lawerance Bach Mobility Specialist Please contact via Special educational needs teacher or  Rehab office at [phone]

## 2023-01-12 NOTE — Progress Notes (Signed)
Called and provided report for [facility]. Spoke with Westley Hummer, Charity fundraiser.

## 2023-01-12 NOTE — Consult Note (Signed)
[facility] Customer service manager [facility]) Accountable Care Organization (ACO) [facility] Liaison Note  01/12/2023  [name] [birth year] [id number]  Location: [facility] RN Hospital Liaison screened the patient remotely at [facility].  Insurance: [employer]   Justin Schmidt is a 42 y.o. male who is a Primary Care Patient of Daphine Deutscher, Geologist, engineering, FNP- [facility]. The patient was screened for readmission hospitalization with noted low risk score for unplanned readmission risk with 1 IP in 6 months.  The patient was assessed for potential [facility]) Care Management service needs for post hospital transition for care coordination. Review of patient's electronic medical record reveals patient was admitted with Cellulitis. Pt will d/c to a SNF level of care for ongoing STR. Liaison will collaborate with the PAC-RN on pt's discharge disposition.  Plan: [facility] Liaison will continue to follow progress and disposition to asess for post hospital community care coordination/management needs.  Referral request for community care coordination: pending disposition.   [facility] Care Management/Population Health does not replace or interfere with any arrangements made by the Inpatient Transition of Care team.   For questions contact:   Elliot Cousin, RN, [facility] Liaison [place]   Population Health Office Hours MTWF  8:00 am-6:00 pm [phone] mobile [phone] [Office toll free line] Office Hours are M-F 8:30 - 5 pm [email or web address]

## 2023-01-12 NOTE — Plan of Care (Signed)
Problem: Education: Goal: Knowledge of General Education information will improve Description: Including pain rating scale, medication(s)/side effects and non-pharmacologic comfort measures Outcome: Progressing   Problem: Health Behavior/Discharge Planning: Goal: Ability to manage health-related needs will improve Outcome: Progressing   Problem: Health Behavior/Discharge Planning: Goal: Ability to manage health-related needs will improve Outcome: Progressing   Problem: Clinical Measurements: Goal: Ability to maintain clinical measurements within normal limits will improve Outcome: Progressing Goal: Will remain free from infection Outcome: Progressing Goal: Diagnostic test results will improve Outcome: Progressing Goal: Respiratory complications will improve Outcome: Progressing Goal: Cardiovascular complication will be avoided Outcome: Progressing

## 2023-01-13 DIAGNOSIS — Z86718 Personal history of other venous thrombosis and embolism: Secondary | ICD-10-CM | POA: Diagnosis not present

## 2023-01-13 DIAGNOSIS — R531 Weakness: Secondary | ICD-10-CM | POA: Diagnosis not present

## 2023-01-13 DIAGNOSIS — L03115 Cellulitis of right lower limb: Secondary | ICD-10-CM | POA: Diagnosis not present

## 2023-01-13 DIAGNOSIS — I10 Essential (primary) hypertension: Secondary | ICD-10-CM | POA: Diagnosis not present

## 2023-01-13 DIAGNOSIS — G8929 Other chronic pain: Secondary | ICD-10-CM | POA: Diagnosis not present

## 2023-01-13 DIAGNOSIS — J302 Other seasonal allergic rhinitis: Secondary | ICD-10-CM | POA: Diagnosis not present

## 2023-01-13 DIAGNOSIS — E559 Vitamin D deficiency, unspecified: Secondary | ICD-10-CM | POA: Diagnosis not present

## 2023-01-13 DIAGNOSIS — E785 Hyperlipidemia, unspecified: Secondary | ICD-10-CM | POA: Diagnosis not present

## 2023-01-13 DIAGNOSIS — E1140 Type 2 diabetes mellitus with diabetic neuropathy, unspecified: Secondary | ICD-10-CM | POA: Diagnosis not present

## 2023-01-13 LAB — CULTURE, BLOOD (ROUTINE X 2)
Culture: NO GROWTH
Culture: NO GROWTH
Special Requests: ADEQUATE

## 2023-01-14 DIAGNOSIS — L0390 Cellulitis, unspecified: Secondary | ICD-10-CM | POA: Diagnosis not present

## 2023-01-14 DIAGNOSIS — R5381 Other malaise: Secondary | ICD-10-CM | POA: Diagnosis not present

## 2023-01-14 DIAGNOSIS — I739 Peripheral vascular disease, unspecified: Secondary | ICD-10-CM | POA: Diagnosis not present

## 2023-01-14 DIAGNOSIS — M6281 Muscle weakness (generalized): Secondary | ICD-10-CM | POA: Diagnosis not present

## 2023-01-14 DIAGNOSIS — L03115 Cellulitis of right lower limb: Secondary | ICD-10-CM | POA: Diagnosis not present

## 2023-01-14 DIAGNOSIS — E559 Vitamin D deficiency, unspecified: Secondary | ICD-10-CM | POA: Diagnosis not present

## 2023-01-14 DIAGNOSIS — E119 Type 2 diabetes mellitus without complications: Secondary | ICD-10-CM | POA: Diagnosis not present

## 2023-01-14 DIAGNOSIS — R262 Difficulty in walking, not elsewhere classified: Secondary | ICD-10-CM | POA: Diagnosis not present

## 2023-01-14 DIAGNOSIS — M79604 Pain in right leg: Secondary | ICD-10-CM | POA: Diagnosis not present

## 2023-01-14 DIAGNOSIS — E1140 Type 2 diabetes mellitus with diabetic neuropathy, unspecified: Secondary | ICD-10-CM | POA: Diagnosis not present

## 2023-01-15 DIAGNOSIS — G8929 Other chronic pain: Secondary | ICD-10-CM | POA: Diagnosis not present

## 2023-01-15 DIAGNOSIS — D649 Anemia, unspecified: Secondary | ICD-10-CM | POA: Diagnosis not present

## 2023-01-15 DIAGNOSIS — N181 Chronic kidney disease, stage 1: Secondary | ICD-10-CM | POA: Diagnosis not present

## 2023-01-15 DIAGNOSIS — R531 Weakness: Secondary | ICD-10-CM | POA: Diagnosis not present

## 2023-01-15 DIAGNOSIS — E1140 Type 2 diabetes mellitus with diabetic neuropathy, unspecified: Secondary | ICD-10-CM | POA: Diagnosis not present

## 2023-01-19 DIAGNOSIS — E1140 Type 2 diabetes mellitus with diabetic neuropathy, unspecified: Secondary | ICD-10-CM | POA: Diagnosis not present

## 2023-01-19 DIAGNOSIS — R531 Weakness: Secondary | ICD-10-CM | POA: Diagnosis not present

## 2023-01-19 DIAGNOSIS — L0390 Cellulitis, unspecified: Secondary | ICD-10-CM | POA: Diagnosis not present

## 2023-01-21 DIAGNOSIS — R5381 Other malaise: Secondary | ICD-10-CM | POA: Diagnosis not present

## 2023-01-21 DIAGNOSIS — L03115 Cellulitis of right lower limb: Secondary | ICD-10-CM | POA: Diagnosis not present

## 2023-01-21 DIAGNOSIS — M79604 Pain in right leg: Secondary | ICD-10-CM | POA: Diagnosis not present

## 2023-01-21 DIAGNOSIS — R262 Difficulty in walking, not elsewhere classified: Secondary | ICD-10-CM | POA: Diagnosis not present

## 2023-01-22 ENCOUNTER — Other Ambulatory Visit: Payer: Self-pay | Admitting: *Deleted

## 2023-01-22 DIAGNOSIS — R531 Weakness: Secondary | ICD-10-CM | POA: Diagnosis not present

## 2023-01-22 DIAGNOSIS — L0390 Cellulitis, unspecified: Secondary | ICD-10-CM | POA: Diagnosis not present

## 2023-01-22 NOTE — Patient Outreach (Signed)
Last entry for 01/21/23. Mr. Schlimgen resides in [facility] SNF.   Screening for potential care coordination/ chronic care management services as a benefit of health plan and primary care provider.  Collaboration with [facility] social worker and Sales promotion account executive. Mr. Frieders will return home with family post SNF.   Will plan outreach to discuss potential care coordination/care management needs.   Raiford Noble, MSN, RN, BSN [place]  [facility], Healthy Communities RN Post- Acute Care Coordinator Direct Dial: [phone]

## 2023-01-23 ENCOUNTER — Other Ambulatory Visit: Payer: Self-pay | Admitting: *Deleted

## 2023-01-23 ENCOUNTER — Other Ambulatory Visit: Payer: Self-pay | Admitting: Nurse Practitioner

## 2023-01-23 DIAGNOSIS — G8929 Other chronic pain: Secondary | ICD-10-CM

## 2023-01-23 DIAGNOSIS — M545 Low back pain, unspecified: Secondary | ICD-10-CM

## 2023-01-23 NOTE — Patient Outreach (Signed)
Mr. Justin Schmidt resides in [facility]. Screening for potential care coordination/ chronic care management services as a benefit of health plan and primary care provider.  Telephone call made to Mr. Justin Schmidt [phone]. No answer. HIPAA compliant voicemail message left to request return call.   Will plan outreach again to discuss care management needs.   Raiford Noble, MSN, RN, BSN [place]  [facility], Healthy Communities RN Post- Acute Care Coordinator Direct Dial: [phone]

## 2023-01-25 DIAGNOSIS — E1140 Type 2 diabetes mellitus with diabetic neuropathy, unspecified: Secondary | ICD-10-CM | POA: Diagnosis not present

## 2023-01-25 DIAGNOSIS — R531 Weakness: Secondary | ICD-10-CM | POA: Diagnosis not present

## 2023-01-25 DIAGNOSIS — L0390 Cellulitis, unspecified: Secondary | ICD-10-CM | POA: Diagnosis not present

## 2023-01-25 DIAGNOSIS — G8929 Other chronic pain: Secondary | ICD-10-CM | POA: Diagnosis not present

## 2023-01-27 ENCOUNTER — Other Ambulatory Visit: Payer: Self-pay | Admitting: *Deleted

## 2023-01-27 NOTE — Patient Outreach (Signed)
Post- Acute Care Coordinator follow up. Per [facility] Mr. Heinle discharged from [facility] skilled nursing facility on 01/26/23. Screening for potential care coordination/care management services as benefit of health plan and  Primary Care Provider.  Confirmed with Sunny Schlein [facility] social worker, home health was not recommended.   Telephone call made to Mr. Russum [phone]. Spoke with father/DPR who reports Mr. Brockman is not home, he is currently at his grandfather's house. Advised Clinical research associate to contact Mr. Danzy on his mobile phone.  Telephone call made to Mr. Gelber at [phone]. No answer. Voicemail box full. Sent HIPAA compliant SMS text message with call back number only requesting return call.   Raiford Noble, MSN, RN, BSN [place]  [facility], Healthy Communities RN Post- Acute Care Coordinator Direct Dial: [phone]

## 2023-02-03 ENCOUNTER — Inpatient Hospital Stay: Payer: 59 | Admitting: Nurse Practitioner

## 2023-02-03 NOTE — Progress Notes (Deleted)
Subjective:    Patient ID: Justin Schmidt, male    DOB: July 12, 1980, 42 y.o.   MRN: [id number]   Chief Complaint: hospital follow up  HPI Patient weh=nt t o the hospital on 01/08/23 and was admitted for sepsis. Was in hospital for 4 dya sand was given IV antibiotics. He was discharged on clindamycin and tod to elevate legs. They discussed better diabetic control with him. Since being discharged *** Patient Active Problem List   Diagnosis Date Noted   Cellulitis 01/08/2023   Essential hypertension 01/08/2023   Chronic pain of left knee 07/10/2020   History of pulmonary embolus (PE) 11/08/2018   History of DVT (deep vein thrombosis) 11/08/2018   Type 2 diabetes mellitus without complication, without long-term current use of insulin (HCC) 08/11/2018   Morbid obesity with BMI of 60.0-69.9, adult (HCC) 01/30/2017   Controlled substance agreement signed 09/11/2016   Chronic back pain 09/11/2016   Depression, recurrent (HCC) 09/11/2016   Fatty liver 05/17/2015   Hyperlipidemia associated with type 2 diabetes mellitus (HCC) 09/05/2013       Review of Systems     Objective:   Physical Exam        Assessment & Plan:

## 2023-02-04 ENCOUNTER — Encounter: Payer: Self-pay | Admitting: Family Medicine

## 2023-02-04 ENCOUNTER — Ambulatory Visit (INDEPENDENT_AMBULATORY_CARE_PROVIDER_SITE_OTHER): Payer: 59 | Admitting: Family Medicine

## 2023-02-04 VITALS — BP 123/69 | HR 80 | Temp 96.5°F | Ht 69.0 in | Wt >= 6400 oz

## 2023-02-04 DIAGNOSIS — L03115 Cellulitis of right lower limb: Secondary | ICD-10-CM | POA: Diagnosis not present

## 2023-02-04 DIAGNOSIS — L03116 Cellulitis of left lower limb: Secondary | ICD-10-CM | POA: Diagnosis not present

## 2023-02-04 NOTE — Patient Instructions (Signed)
CeraVe    Cetaphil

## 2023-02-04 NOTE — Progress Notes (Signed)
Subjective:  Patient ID: Justin Schmidt, male    DOB: 1980-04-30, 42 y.o.   MRN: [id number]  Patient Care Team: Bennie Pierini, FNP as PCP - General (Family Medicine) Randa Spike, Kelton Pillar, LCSW as Social Worker (Licensed Clinical Social Worker) Conley Rolls, Westley Chandler, MD as Referring Physician (Optometry)   Chief Complaint:  Hospitalization Follow-up (/01/08/2023 - 01/12/2023 (4 days)/[facility]- cellulitis right lower leg )   HPI: Justin Schmidt is a 42 y.o. male presenting on 02/04/2023 for Hospitalization Follow-up (/01/08/2023 - 01/12/2023 (4 days)/[facility]- cellulitis right lower leg )   Discussed the use of AI scribe software for clinical note transcription with the patient, who gave verbal consent to proceed.  History of Present Illness   The patient, with a history of cellulitis, presented with a recent hospital admission due to a bug bite that led to a severe infection. He reported that after a previous visit to the clinic, where his legs were wrapped due to cellulitis, he woke up the next morning with significant swelling and pain in his right foot and calf, rendering him unable to walk. This necessitated an ambulance call and subsequent hospital admission. During his hospital stay, he was diagnosed with sepsis, which was later revised to cellulitis. He was treated with antibiotics and reported having a fever during this time.  The patient also reported struggling with blood sugar control. He has since made dietary changes, including reducing portion sizes and consuming smaller, more frequent meals. This has resulted in a significant weight loss. 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  The patient has been managing his symptoms at home with the use of compression socks and elevation of his legs. He also reported using a compression machine twice a day. 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 He has been advised to use specific emollients to help with this condition.  The patient has been adhering to his medication regimen, which includes antibiotics and furosemide. He has not reported any side effects from these medications. He has also been monitoring his blood pressure and blood sugar levels at home, which he reported as being 'pretty good.'        Relevant past medical, surgical, family, and social history reviewed and updated as indicated.  Allergies and medications reviewed and updated. Data reviewed: Chart in Epic.   Past Medical History:  Diagnosis Date   Acute saddle pulmonary embolism without acute cor pulmonale (HCC) 01/30/2017   Back pain    Diabetic nephropathy (HCC) 08/22/2021   Gallstones    Hyperlipidemia    Type 2 diabetes mellitus without complication, without long-term current use of insulin (HCC) 08/11/2018   VTE (venous thromboembolism) 05/14/2017    Past Surgical History:  Procedure Laterality Date   APPENDECTOMY  1991   CHOLECYSTECTOMY  01/23/2012   Procedure: LAPAROSCOPIC CHOLECYSTECTOMY;  Surgeon: Fabio Bering, MD;  Location: [facility];  Service: General;  Laterality: N/A;   TONSILLECTOMY      Social History   Socioeconomic History   Marital status: Single    Spouse name: Not on file   Number of children: Not on file   Years of education: 12th grade   Highest education level: High school graduate  Occupational History   Occupation: disabled  Tobacco Use   Smoking status: Never   Smokeless tobacco: Never  Vaping Use   Vaping status: Never Used  Substance and Sexual Activity   Alcohol use: Yes    Alcohol/week: 1.0 standard drink of alcohol    Types: 1 Cans of beer per week    Comment: very rare   Drug use: No   Sexual activity: Never  Other Topics Concern   Not on file  Social History Narrative   Lives alone - no children   Social Determinants of Health    Financial Resource Strain: Low Risk  (12/18/2022)   Overall Financial Resource Strain (CARDIA)    Difficulty of Paying Living Expenses: Not hard at all  Food Insecurity: No Food Insecurity (01/08/2023)   Hunger Vital Sign    Worried About Running Out of Food in the Last Year: Never true    Ran Out of Food in the Last Year: Never true  Transportation Needs: No Transportation Needs (01/08/2023)   PRAPARE - Administrator, Civil Service (Medical): No    Lack of Transportation (Non-Medical): No  Physical Activity: Insufficiently Active (12/18/2022)   Exercise Vital Sign    Days of Exercise per Week: 3 days    Minutes of Exercise per Session: 30 min  Stress: No Stress Concern Present (12/18/2022)   [employer] of Occupational Health - Occupational Stress Questionnaire    Feeling of Stress : Not at all  Social Connections: Socially Isolated (12/18/2022)   Social Connection and Isolation Panel [NHANES]    Frequency of Communication with Friends and Family: More than three times a week    Frequency of Social Gatherings with Friends and Family: More than three times a week    Attends Religious Services: Never    Database administrator or Organizations: No    Attends Banker Meetings: Never    Marital Status: Never married  Intimate Partner Violence: Not At Risk (01/08/2023)   Humiliation, Afraid, Rape, and Kick questionnaire    Fear of Current or Ex-Partner: No    Emotionally Abused: No    Physically Abused: No    Sexually Abused: No    Outpatient Encounter Medications as of 02/04/2023  Medication Sig   acetaminophen-codeine (TYLENOL #3) 300-30 MG tablet Take 1 tablet by mouth in the morning and at bedtime.   Ascorbic Acid (VITAMIN C) 1000 MG tablet Take 1,000 mg by mouth daily.   aspirin EC 81 MG tablet Take 81 mg by mouth every other day.   Cholecalciferol (VITAMIN D) 2000 UNITS tablet Take 2,000 Units by mouth daily.   diclofenac (VOLTAREN) 75 MG EC tablet  TAKE 1 TABLET BY MOUTH TWICE  DAILY FOR FOOT PAIN   diclofenac Sodium (VOLTAREN) 1 % GEL Apply 2 g topically 4 (four) times daily.   escitalopram (LEXAPRO) 10 MG tablet Take 1 tablet (10 mg total) by mouth daily.   Flaxseed, Linseed, (FLAXSEED OIL PO) Take 1 capsule by mouth daily.   fluticasone (FLONASE) 50 MCG/ACT nasal spray SPRAY 2 SPRAYS INTO EACH NOSTRIL EVERY DAY   furosemide (LASIX) 20 MG tablet Take 1 tablet (20 mg total) by mouth daily.   GARLIC PO Take by mouth.   lisinopril (ZESTRIL) 10 MG tablet Take 1 tablet (10 mg total) by mouth daily.   Multiple Vitamin (MULTIVITAMIN WITH MINERALS) TABS Take 1 tablet by mouth daily.   rosuvastatin (CRESTOR) 5 MG tablet Take 1 tablet (5 mg total) by mouth daily.   sitaGLIPtin (JANUVIA) 50 MG tablet Take 1 tablet (50 mg total) by mouth daily.  Zinc Acetate, Oral, (ZINC ACETATE PO) Take 1 tablet by mouth daily.   Facility-Administered Encounter Medications as of 02/04/2023  Medication   sodium chloride irrigation 0.9 %    Allergies  Allergen Reactions   Metformin And Related     diarrhea   Xarelto [Rivaroxaban]     DROWSINESS, FATIGUE     ROS per HPI, otherwise negative      Objective:  BP 123/69   Pulse 80   Temp (!) 96.5 F (35.8 C) (Temporal)   Ht 5\' 9"  (1.753 m)   Wt (!) 426 lb 9.6 oz (193.5 kg)   SpO2 94%   BMI 63.00 kg/m    Wt Readings from Last 3 Encounters:  02/04/23 (!) 426 lb 9.6 oz (193.5 kg)  01/08/23 (!) 455 lb (206.4 kg)  01/06/23 (!) 455 lb (206.4 kg)    Physical Exam Vitals and nursing note reviewed.  Constitutional:      General: He is not in acute distress.    Appearance: He is morbidly obese. He is not ill-appearing, toxic-appearing or diaphoretic.  HENT:     Head: Normocephalic and atraumatic.     Mouth/Throat:     Mouth: Mucous membranes are moist.  Eyes:     Pupils: Pupils are equal, round, and reactive to light.  Cardiovascular:     Rate and Rhythm: Normal rate and regular rhythm.      Heart sounds: Normal heart sounds.  Pulmonary:     Effort: Pulmonary effort is normal.     Breath sounds: Normal breath sounds.  Skin:    General: Skin is warm and dry.     Capillary Refill: Capillary refill takes less than 2 seconds.     Comments: Bilateral lower legs: Discoloration on leg indicative of vascular dermatitis. Small white spot on right shin with history of hematoma. Dark spot on back of right calf, approximately quarter size.  Neurological:     General: No focal deficit present.     Mental Status: He is alert and oriented to person, place, and time.  Psychiatric:        Mood and Affect: Mood normal.        Behavior: Behavior normal.        Thought Content: Thought content normal.        Judgment: Judgment normal.     Results for orders placed or performed during the hospital encounter of 01/08/23  Resp panel by RT-PCR (RSV, Flu A&B, Covid) Anterior Nasal Swab   Specimen: Anterior Nasal Swab  Result Value Ref Range   SARS Coronavirus 2 by RT PCR NEGATIVE NEGATIVE   Influenza A by PCR NEGATIVE NEGATIVE   Influenza B by PCR NEGATIVE NEGATIVE   Resp Syncytial Virus by PCR NEGATIVE NEGATIVE  Blood Culture (routine x 2)   Specimen: Right Antecubital; Blood  Result Value Ref Range   Specimen Description RIGHT ANTECUBITAL    Special Requests      BOTTLES DRAWN AEROBIC AND ANAEROBIC Blood Culture adequate volume   Culture      NO GROWTH 5 DAYS Performed at [facility], [address], [place], [place] [address]    Report Status 01/13/2023 FINAL   Blood Culture (routine x 2)   Specimen: BLOOD  Result Value Ref Range   Specimen Description BLOOD BLOOD LEFT ARM    Special Requests      BOTTLES DRAWN AEROBIC AND ANAEROBIC Blood Culture results may not be optimal due to an excessive volume of blood received in  culture bottles   Culture      NO GROWTH 5 DAYS Performed at [facility], [address] [place], [place] [address]    Report Status 01/13/2023 FINAL   CBC  with Differential  Result Value Ref Range   WBC 13.9 (H) 4.0 - 10.5 K/uL   RBC 4.29 4.22 - 5.81 MIL/uL   Hemoglobin 13.4 13.0 - 17.0 g/dL   HCT 60.4 54.0 - 98.1 %   MCV 93.9 80.0 - 100.0 fL   MCH 31.2 26.0 - 34.0 pg   MCHC 33.3 30.0 - 36.0 g/dL   RDW 19.1 47.8 - 29.5 %   Platelets 273 150 - 400 K/uL   nRBC 0.0 0.0 - 0.2 %   Neutrophils Relative % 80 %   Neutro Abs 11.1 (H) 1.7 - 7.7 K/uL   Lymphocytes Relative 9 %   Lymphs Abs 1.2 0.7 - 4.0 K/uL   Monocytes Relative 11 %   Monocytes Absolute 1.5 (H) 0.1 - 1.0 K/uL   Eosinophils Relative 0 %   Eosinophils Absolute 0.0 0.0 - 0.5 K/uL   Basophils Relative 0 %   Basophils Absolute 0.1 0.0 - 0.1 K/uL   Immature Granulocytes 0 %   Abs Immature Granulocytes 0.05 0.00 - 0.07 K/uL  Comprehensive metabolic panel  Result Value Ref Range   Sodium 135 135 - 145 mmol/L   Potassium 3.6 3.5 - 5.1 mmol/L   Chloride 98 98 - 111 mmol/L   CO2 26 22 - 32 mmol/L   Glucose, Bld 145 (H) 70 - 99 mg/dL   BUN 15 6 - 20 mg/dL   Creatinine, Ser 6.21 0.61 - 1.24 mg/dL   Calcium 8.7 (L) 8.9 - 10.3 mg/dL   Total Protein 8.1 6.5 - 8.1 g/dL   Albumin 3.7 3.5 - 5.0 g/dL   AST 17 15 - 41 U/L   ALT 32 0 - 44 U/L   Alkaline Phosphatase 51 38 - 126 U/L   Total Bilirubin 1.2 0.3 - 1.2 mg/dL   GFR, Estimated >30 >86 mL/min   Anion gap 11 5 - 15  Lactic acid, plasma  Result Value Ref Range   Lactic Acid, Venous 1.7 0.5 - 1.9 mmol/L  Protime-INR  Result Value Ref Range   Prothrombin Time 14.8 11.4 - 15.2 seconds   INR 1.1 0.8 - 1.2  APTT  Result Value Ref Range   aPTT 28 24 - 36 seconds  Urinalysis, Routine w reflex microscopic -Urine, Clean Catch  Result Value Ref Range   Color, Urine YELLOW YELLOW   APPearance CLEAR CLEAR   Specific Gravity, Urine 1.012 1.005 - 1.030   pH 6.0 5.0 - 8.0   Glucose, UA NEGATIVE NEGATIVE mg/dL   Hgb urine dipstick NEGATIVE NEGATIVE   Bilirubin Urine NEGATIVE NEGATIVE   Ketones, ur 5 (A) NEGATIVE mg/dL   Protein, ur  NEGATIVE NEGATIVE mg/dL   Nitrite NEGATIVE NEGATIVE   Leukocytes,Ua NEGATIVE NEGATIVE  HIV Antibody (routine testing w rflx)  Result Value Ref Range   HIV Screen 4th Generation wRfx Non Reactive Non Reactive  Hemoglobin A1c  Result Value Ref Range   Hgb A1c MFr Bld 7.3 (H) 4.8 - 5.6 %   Mean Plasma Glucose 163 mg/dL  Heparin level (unfractionated)  Result Value Ref Range   Heparin Unfractionated <0.10 (L) 0.30 - 0.70 IU/mL  Glucose, capillary  Result Value Ref Range   Glucose-Capillary 125 (H) 70 - 99 mg/dL  Comprehensive metabolic panel  Result Value Ref Range  Sodium 133 (L) 135 - 145 mmol/L   Potassium 3.3 (L) 3.5 - 5.1 mmol/L   Chloride 98 98 - 111 mmol/L   CO2 24 22 - 32 mmol/L   Glucose, Bld 192 (H) 70 - 99 mg/dL   BUN 14 6 - 20 mg/dL   Creatinine, Ser 1.61 0.61 - 1.24 mg/dL   Calcium 8.2 (L) 8.9 - 10.3 mg/dL   Total Protein 7.3 6.5 - 8.1 g/dL   Albumin 3.2 (L) 3.5 - 5.0 g/dL   AST 16 15 - 41 U/L   ALT 28 0 - 44 U/L   Alkaline Phosphatase 45 38 - 126 U/L   Total Bilirubin 0.9 0.3 - 1.2 mg/dL   GFR, Estimated >09 >60 mL/min   Anion gap 11 5 - 15  CBC  Result Value Ref Range   WBC 11.5 (H) 4.0 - 10.5 K/uL   RBC 3.92 (L) 4.22 - 5.81 MIL/uL   Hemoglobin 12.3 (L) 13.0 - 17.0 g/dL   HCT 45.4 (L) 09.8 - 11.9 %   MCV 93.6 80.0 - 100.0 fL   MCH 31.4 26.0 - 34.0 pg   MCHC 33.5 30.0 - 36.0 g/dL   RDW 14.7 82.9 - 56.2 %   Platelets 263 150 - 400 K/uL   nRBC 0.0 0.0 - 0.2 %  Heparin level (unfractionated)  Result Value Ref Range   Heparin Unfractionated 0.14 (L) 0.30 - 0.70 IU/mL  Glucose, capillary  Result Value Ref Range   Glucose-Capillary 133 (H) 70 - 99 mg/dL   Comment 1 Notify RN    Comment 2 Document in Chart   Glucose, capillary  Result Value Ref Range   Glucose-Capillary 147 (H) 70 - 99 mg/dL   Comment 1 Notify RN    Comment 2 Document in Chart   Glucose, capillary  Result Value Ref Range   Glucose-Capillary 110 (H) 70 - 99 mg/dL   Comment 1 Notify RN     Comment 2 Document in Chart   CBC  Result Value Ref Range   WBC 11.5 (H) 4.0 - 10.5 K/uL   RBC 3.75 (L) 4.22 - 5.81 MIL/uL   Hemoglobin 11.7 (L) 13.0 - 17.0 g/dL   HCT 13.0 (L) 86.5 - 78.4 %   MCV 95.7 80.0 - 100.0 fL   MCH 31.2 26.0 - 34.0 pg   MCHC 32.6 30.0 - 36.0 g/dL   RDW 69.6 29.5 - 28.4 %   Platelets 273 150 - 400 K/uL   nRBC 0.0 0.0 - 0.2 %  Glucose, capillary  Result Value Ref Range   Glucose-Capillary 140 (H) 70 - 99 mg/dL   Comment 1 Notify RN    Comment 2 Document in Chart   Glucose, capillary  Result Value Ref Range   Glucose-Capillary 142 (H) 70 - 99 mg/dL  Glucose, capillary  Result Value Ref Range   Glucose-Capillary 137 (H) 70 - 99 mg/dL  Glucose, capillary  Result Value Ref Range   Glucose-Capillary 128 (H) 70 - 99 mg/dL  CBC  Result Value Ref Range   WBC 11.3 (H) 4.0 - 10.5 K/uL   RBC 3.94 (L) 4.22 - 5.81 MIL/uL   Hemoglobin 12.3 (L) 13.0 - 17.0 g/dL   HCT 13.2 (L) 44.0 - 10.2 %   MCV 95.2 80.0 - 100.0 fL   MCH 31.2 26.0 - 34.0 pg   MCHC 32.8 30.0 - 36.0 g/dL   RDW 72.5 36.6 - 44.0 %   Platelets 296 150 - 400 K/uL  nRBC 0.0 0.0 - 0.2 %  Glucose, capillary  Result Value Ref Range   Glucose-Capillary 143 (H) 70 - 99 mg/dL  Glucose, capillary  Result Value Ref Range   Glucose-Capillary 154 (H) 70 - 99 mg/dL  Glucose, capillary  Result Value Ref Range   Glucose-Capillary 213 (H) 70 - 99 mg/dL  Glucose, capillary  Result Value Ref Range   Glucose-Capillary 102 (H) 70 - 99 mg/dL  CBC  Result Value Ref Range   WBC 10.9 (H) 4.0 - 10.5 K/uL   RBC 3.88 (L) 4.22 - 5.81 MIL/uL   Hemoglobin 12.2 (L) 13.0 - 17.0 g/dL   HCT 82.9 (L) 56.2 - 13.0 %   MCV 93.3 80.0 - 100.0 fL   MCH 31.4 26.0 - 34.0 pg   MCHC 33.7 30.0 - 36.0 g/dL   RDW 86.5 78.4 - 69.6 %   Platelets 350 150 - 400 K/uL   nRBC 0.0 0.0 - 0.2 %  Glucose, capillary  Result Value Ref Range   Glucose-Capillary 150 (H) 70 - 99 mg/dL   Comment 1 Notify RN    Comment 2 Document in Chart    Glucose, capillary  Result Value Ref Range   Glucose-Capillary 132 (H) 70 - 99 mg/dL  Glucose, capillary  Result Value Ref Range   Glucose-Capillary 157 (H) 70 - 99 mg/dL  CBG monitoring, ED  Result Value Ref Range   Glucose-Capillary 135 (H) 70 - 99 mg/dL       Pertinent labs & imaging results that were available during my care of the patient were reviewed by me and considered in my medical decision making.  Assessment & Plan:  Justin Schmidt "[name]" was seen today for hospitalization follow-up.  Assessment and Plan    Cellulitis Recent hospitalization for cellulitis with sepsis. Completed antibiotics course. 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 -Continue use of compression socks and compression machine. -Apply CeraVe or Cetaphil emollient to skin multiple times daily. -Contact provider if signs of infection worsen or spread.  Diabetes Recent elevated blood sugars. Patient has made dietary changes including portion control and reduced sugar intake. -Continue current diabetes management. -Check blood sugars regularly and maintain log for next appointment. -Repeat A1C at next appointment with Gennette Pac on 03/20/2023.  General Health Maintenance -Complete basic labs today including CBC, kidney and liver function. -Avoid aspartame in diet. -Increase water intake, especially when consuming drinks with high sodium content like Gatorade.     Diagnoses and all orders for this visit:  Cellulitis of right lower extremity -     CBC with Differential/Platelet -     CMP14+EGFR  Bilateral lower leg cellulitis -     CBC with Differential/Platelet -     CMP14+EGFR     Continue all other maintenance medications.  Follow up plan: Return if symptoms worsen or fail to improve.   Continue healthy lifestyle choices, including diet (rich in fruits, vegetables, and lean proteins, and low in salt and  simple carbohydrates) and exercise (at least 30 minutes of moderate physical activity daily).   The above assessment and management plan was discussed with the patient. The patient verbalized understanding of and has agreed to the management plan. Patient is aware to call the clinic if they develop any new symptoms or if symptoms persist or worsen. Patient is aware when to return to the clinic for a follow-up visit. Patient educated on when it is appropriate to go to the emergency  department.   Kari Baars, FNP-C [facility] [phone]

## 2023-02-05 LAB — CMP14+EGFR
ALT: 58 [IU]/L — ABNORMAL HIGH (ref 0–44)
AST: 28 [IU]/L (ref 0–40)
Albumin: 3.9 g/dL — ABNORMAL LOW (ref 4.1–5.1)
Alkaline Phosphatase: 62 [IU]/L (ref 44–121)
BUN/Creatinine Ratio: 15 (ref 9–20)
BUN: 18 mg/dL (ref 6–24)
Bilirubin Total: 0.2 mg/dL (ref 0.0–1.2)
CO2: 23 mmol/L (ref 20–29)
Calcium: 9.7 mg/dL (ref 8.7–10.2)
Chloride: 103 mmol/L (ref 96–106)
Creatinine, Ser: 1.19 mg/dL (ref 0.76–1.27)
Globulin, Total: 2.9 g/dL (ref 1.5–4.5)
Glucose: 114 mg/dL — ABNORMAL HIGH (ref 70–99)
Potassium: 4.1 mmol/L (ref 3.5–5.2)
Sodium: 140 mmol/L (ref 134–144)
Total Protein: 6.8 g/dL (ref 6.0–8.5)
eGFR: 78 mL/min/{1.73_m2} (ref >59)

## 2023-02-05 LAB — CBC WITH DIFFERENTIAL/PLATELET
Basophils Absolute: 0.1 10*3/uL (ref 0.0–0.2)
Basos: 1 %
EOS (ABSOLUTE): 0.1 10*3/uL (ref 0.0–0.4)
Eos: 2 %
Hematocrit: 39.8 % (ref 37.5–51.0)
Hemoglobin: 13.1 g/dL (ref 13.0–17.7)
Immature Grans (Abs): 0 10*3/uL (ref 0.0–0.1)
Immature Granulocytes: 0 %
Lymphocytes Absolute: 1.4 10*3/uL (ref 0.7–3.1)
Lymphs: 25 %
MCH: 31 pg (ref 26.6–33.0)
MCHC: 32.9 g/dL (ref 31.5–35.7)
MCV: 94 fL (ref 79–97)
Monocytes Absolute: 0.4 10*3/uL (ref 0.1–0.9)
Monocytes: 8 %
Neutrophils Absolute: 3.4 10*3/uL (ref 1.4–7.0)
Neutrophils: 64 %
Platelets: 239 10*3/uL (ref 150–450)
RBC: 4.22 x10E6/uL (ref 4.14–5.80)
RDW: 13.4 % (ref 11.6–15.4)
WBC: 5.4 10*3/uL (ref 3.4–10.8)

## 2023-03-06 ENCOUNTER — Other Ambulatory Visit: Payer: Self-pay | Admitting: Nurse Practitioner

## 2023-03-06 DIAGNOSIS — E119 Type 2 diabetes mellitus without complications: Secondary | ICD-10-CM

## 2023-03-13 ENCOUNTER — Other Ambulatory Visit: Payer: Self-pay | Admitting: *Deleted

## 2023-03-13 DIAGNOSIS — L03115 Cellulitis of right lower limb: Secondary | ICD-10-CM

## 2023-03-20 ENCOUNTER — Other Ambulatory Visit: Payer: Self-pay | Admitting: Nurse Practitioner

## 2023-03-20 ENCOUNTER — Ambulatory Visit: Payer: 59 | Admitting: Nurse Practitioner

## 2023-03-20 ENCOUNTER — Encounter: Payer: Self-pay | Admitting: Nurse Practitioner

## 2023-03-20 VITALS — BP 147/93 | HR 91 | Temp 97.6°F | Ht 69.0 in | Wt >= 6400 oz

## 2023-03-20 DIAGNOSIS — F339 Major depressive disorder, recurrent, unspecified: Secondary | ICD-10-CM | POA: Diagnosis not present

## 2023-03-20 DIAGNOSIS — E781 Pure hyperglyceridemia: Secondary | ICD-10-CM | POA: Diagnosis not present

## 2023-03-20 DIAGNOSIS — I10 Essential (primary) hypertension: Secondary | ICD-10-CM | POA: Diagnosis not present

## 2023-03-20 DIAGNOSIS — E1169 Type 2 diabetes mellitus with other specified complication: Secondary | ICD-10-CM

## 2023-03-20 DIAGNOSIS — E119 Type 2 diabetes mellitus without complications: Secondary | ICD-10-CM

## 2023-03-20 DIAGNOSIS — E785 Hyperlipidemia, unspecified: Secondary | ICD-10-CM

## 2023-03-20 DIAGNOSIS — Z7984 Long term (current) use of oral hypoglycemic drugs: Secondary | ICD-10-CM | POA: Diagnosis not present

## 2023-03-20 DIAGNOSIS — Z6841 Body Mass Index (BMI) 40.0 and over, adult: Secondary | ICD-10-CM

## 2023-03-20 DIAGNOSIS — R609 Edema, unspecified: Secondary | ICD-10-CM | POA: Diagnosis not present

## 2023-03-20 LAB — BAYER DCA HB A1C WAIVED: HB A1C (BAYER DCA - WAIVED): 7 % — ABNORMAL HIGH (ref 4.8–5.6)

## 2023-03-20 MED ORDER — FUROSEMIDE 20 MG PO TABS
20.0000 mg | ORAL_TABLET | Freq: Every day | ORAL | 1 refills | Status: DC
Start: 2023-03-20 — End: 2023-04-13

## 2023-03-20 MED ORDER — LISINOPRIL 10 MG PO TABS: 10.0000 mg | ORAL_TABLET | Freq: Every day | ORAL | 1 refills | Status: DC

## 2023-03-20 MED ORDER — ROSUVASTATIN CALCIUM 5 MG PO TABS
5.0000 mg | ORAL_TABLET | Freq: Every day | ORAL | 1 refills | Status: DC
Start: 2023-03-20 — End: 2023-03-25

## 2023-03-20 MED ORDER — SITAGLIPTIN PHOSPHATE 50 MG PO TABS
50.0000 mg | ORAL_TABLET | Freq: Every day | ORAL | 1 refills | Status: DC
Start: 2023-03-20 — End: 2023-05-18

## 2023-03-20 MED ORDER — ESCITALOPRAM OXALATE 10 MG PO TABS: 10.0000 mg | ORAL_TABLET | Freq: Every day | ORAL | 1 refills | Status: DC

## 2023-03-20 NOTE — Progress Notes (Signed)
Subjective:    Patient ID: Justin Schmidt, male    DOB: 02-Jan-1981, 42 y.o.   MRN: [id number]   Chief Complaint: medical management of chronic issues     HPI:  Justin Schmidt is a 42 y.o. who identifies as a male who was assigned male at birth.   Social history: Lives with: a friend Work history: disability   Comes in today for follow up of the following chronic medical issues:  1. Essential hypertension No c/o chest pain, sob or headache. Does not check blood pressure at home BP Readings from Last 3 Encounters:  02/04/23 123/69  01/12/23 138/70  01/06/23 117/73     2. Hyperlipidemia associated with type 2 diabetes mellitus (HCC) Does not watch diet. Says he works out in home gym almost daily Lab Results  Component Value Date   CHOL 175 12/22/2022   HDL 46 12/22/2022   LDLCALC 100 (H) 12/22/2022   TRIG 169 (H) 12/22/2022   CHOLHDL 3.8 12/22/2022     3. Type 2 diabetes mellitus without complication, without long-term current use of insulin (HCC) Fasting blood sugars are running around 120-140. He has really been trying to watch diet Lab Results  Component Value Date   HGBA1C 7.3 (H) 01/08/2023     4. Depression, recurrent (HCC) Is on lexapro and is doing well    03/20/2023    2:30 PM 01/06/2023    3:07 PM 12/22/2022    3:00 PM  Depression screen PHQ 2/9  Decreased Interest 0 0 0  Down, Depressed, Hopeless 1 0 0  PHQ - 2 Score 1 0 0  Altered sleeping 1 0 1  Tired, decreased energy 0 0 1  Change in appetite 0 0 0  Feeling bad or failure about yourself  1 0 0  Trouble concentrating 0 0 0  Moving slowly or fidgety/restless 0 0 0  Suicidal thoughts 0 0 0  PHQ-9 Score 3 0 2  Difficult doing work/chores Somewhat difficult Not difficult at all Somewhat difficult     5. Morbid obesity with BMI of 60.0-69.9, adult (HCC) No recent weight changes Wt Readings from Last 3 Encounters:  03/20/23 (!) 426 lb (193.2 kg)  02/04/23 (!) 426 lb 9.6 oz (193.5 kg)   01/08/23 (!) 455 lb (206.4 kg)   BMI Readings from Last 3 Encounters:  03/20/23 62.91 kg/m  02/04/23 63.00 kg/m  01/08/23 67.19 kg/m      New complaints: None today  Allergies  Allergen Reactions   Metformin And Related     diarrhea   Xarelto [Rivaroxaban]     DROWSINESS, FATIGUE   Outpatient Encounter Medications as of 03/20/2023  Medication Sig   acetaminophen-codeine (TYLENOL #3) 300-30 MG tablet Take 1 tablet by mouth in the morning and at bedtime.   Ascorbic Acid (VITAMIN C) 1000 MG tablet Take 1,000 mg by mouth daily.   aspirin EC 81 MG tablet Take 81 mg by mouth every other day.   Cholecalciferol (VITAMIN D) 2000 UNITS tablet Take 2,000 Units by mouth daily.   diclofenac (VOLTAREN) 75 MG EC tablet TAKE 1 TABLET BY MOUTH TWICE  DAILY FOR FOOT PAIN   escitalopram (LEXAPRO) 10 MG tablet Take 1 tablet (10 mg total) by mouth daily.   Flaxseed, Linseed, (FLAXSEED OIL PO) Take 1 capsule by mouth daily.   fluticasone (FLONASE) 50 MCG/ACT nasal spray SPRAY 2 SPRAYS INTO EACH NOSTRIL EVERY DAY   furosemide (LASIX) 20 MG tablet Take 1 tablet (20 mg total)  by mouth daily.   GARLIC PO Take by mouth.   lisinopril (ZESTRIL) 10 MG tablet Take 1 tablet (10 mg total) by mouth daily.   Multiple Vitamin (MULTIVITAMIN WITH MINERALS) TABS Take 1 tablet by mouth daily.   rosuvastatin (CRESTOR) 5 MG tablet Take 1 tablet (5 mg total) by mouth daily.   sitaGLIPtin (JANUVIA) 50 MG tablet TAKE 1 TABLET BY MOUTH DAILY   Zinc Acetate, Oral, (ZINC ACETATE PO) Take 1 tablet by mouth daily.   Facility-Administered Encounter Medications as of 03/20/2023  Medication   sodium chloride irrigation 0.9 %    Past Surgical History:  Procedure Laterality Date   APPENDECTOMY  1991   CHOLECYSTECTOMY  01/23/2012   Procedure: LAPAROSCOPIC CHOLECYSTECTOMY;  Surgeon: Fabio Bering, MD;  Location: [facility];  Service: General;  Laterality: N/A;   TONSILLECTOMY      Family History  Problem Relation Age  of Onset   Diabetes Mother    Diabetes Father    Hypertension Father    Hyperlipidemia Father    Migraines Sister    Diabetes Maternal Uncle    Diabetes Maternal Grandmother    Diabetes Other       Controlled substance contract: n/a     Review of Systems  Constitutional:  Negative for diaphoresis.  Eyes:  Negative for pain.  Respiratory:  Negative for shortness of breath.   Cardiovascular:  Negative for chest pain, palpitations and leg swelling.  Gastrointestinal:  Negative for abdominal pain.  Endocrine: Negative for polydipsia.  Skin:  Negative for rash.  Neurological:  Negative for dizziness, weakness and headaches.  Hematological:  Does not bruise/bleed easily.  All other systems reviewed and are negative.      Objective:   Physical Exam Vitals and nursing note reviewed.  Constitutional:      Appearance: Normal appearance. He is well-developed.  HENT:     Head: Normocephalic.     Nose: Nose normal.     Mouth/Throat:     Mouth: Mucous membranes are moist.     Pharynx: Oropharynx is clear.  Eyes:     Pupils: Pupils are equal, round, and reactive to light.  Neck:     Thyroid: No thyroid mass or thyromegaly.     Vascular: No carotid bruit or JVD.     Trachea: Phonation normal.  Cardiovascular:     Rate and Rhythm: Normal rate and regular rhythm.  Pulmonary:     Effort: Pulmonary effort is normal. No respiratory distress.     Breath sounds: Normal breath sounds.  Abdominal:     General: Bowel sounds are normal.     Palpations: Abdomen is soft.     Tenderness: There is no abdominal tenderness.     Hernia: A hernia (nontender umbilical hernia) is present.  Musculoskeletal:        General: Normal range of motion.     Cervical back: Normal range of motion and neck supple.  Lymphadenopathy:     Cervical: No cervical adenopathy.  Skin:    General: Skin is warm and dry.     Comments: Circulatory skin changes bil lower ext  Neurological:     Mental Status:  He is alert and oriented to person, place, and time.  Psychiatric:        Behavior: Behavior normal.        Thought Content: Thought content normal.        Judgment: Judgment normal.     BP (!) 147/93   Pulse 91  Temp 97.6 F (36.4 C) (Temporal)   Ht 5\' 9"  (1.753 m)   Wt (!) 426 lb (193.2 kg)   SpO2 97%   BMI 62.91 kg/m   Hgba1c 7.0%     Assessment & Plan:   Justin Schmidt comes in today with chief complaint of Medical Management of Chronic Issues   Diagnosis and orders addressed:  1. Essential hypertension Low fat diet - CBC with Differential/Platelet - CMP14+EGFR  2. Hyperlipidemia associated with type 2 diabetes mellitus (HCC) Low fat diet - Lipid panel  3. Type 2 diabetes mellitus without complication, without long-term current use of insulin (HCC) Strict carb counting - Bayer DCA Hb A1c Waived - rosuvastatin (CRESTOR) 5 MG tablet; Take 1 tablet (5 mg total) by mouth daily.  Dispense: 90 tablet; Refill: 1 - lisinopril (ZESTRIL) 10 MG tablet; Take 1 tablet (10 mg total) by mouth daily.  Dispense: 90 tablet; Refill: 1 - sitaGLIPtin (JANUVIA) 50 MG tablet; Take 1 tablet (50 mg total) by mouth daily.  Dispense: 90 tablet; Refill: 1  4. Depression, recurrent (HCC) Stress management - escitalopram (LEXAPRO) 10 MG tablet; Take 1 tablet (10 mg total) by mouth daily.  Dispense: 90 tablet; Refill: 1  5. Morbid obesity with BMI of 60.0-69.9, adult (HCC) Discussed diet and exercise for person with BMI >25 Will recheck weight in 3-6 months   6. Hypertriglyceridemia - rosuvastatin (CRESTOR) 5 MG tablet; Take 1 tablet (5 mg total) by mouth daily.  Dispense: 90 tablet; Refill: 1  7. Swelling - furosemide (LASIX) 20 MG tablet; Take 1 tablet (20 mg total) by mouth daily.  Dispense: 90 tablet; Refill: 1   Labs pending Health Maintenance reviewed Diet and exercise encouraged  Follow up plan: 3 months   [name] Daphine Deutscher, FNP

## 2023-03-20 NOTE — Patient Instructions (Signed)

## 2023-03-21 LAB — CMP14+EGFR
ALT: 38 [IU]/L (ref 0–44)
AST: 28 [IU]/L (ref 0–40)
Albumin: 4.2 g/dL (ref 4.1–5.1)
Alkaline Phosphatase: 63 IU/L (ref 44–121)
BUN/Creatinine Ratio: 13 (ref 9–20)
BUN: 14 mg/dL (ref 6–24)
Bilirubin Total: 0.3 mg/dL (ref 0.0–1.2)
CO2: 22 mmol/L (ref 20–29)
Calcium: 9.8 mg/dL (ref 8.7–10.2)
Chloride: 103 mmol/L (ref 96–106)
Creatinine, Ser: 1.1 mg/dL (ref 0.76–1.27)
Globulin, Total: 3.1 g/dL (ref 1.5–4.5)
Glucose: 118 mg/dL — ABNORMAL HIGH (ref 70–99)
Potassium: 4.9 mmol/L (ref 3.5–5.2)
Sodium: 143 mmol/L (ref 134–144)
Total Protein: 7.3 g/dL (ref 6.0–8.5)
eGFR: 85 mL/min/{1.73_m2} (ref >59)

## 2023-03-21 LAB — LIPID PANEL
Chol/HDL Ratio: 3.6 ratio (ref 0.0–5.0)
Cholesterol, Total: 154 mg/dL (ref 100–199)
HDL: 43 mg/dL (ref >39)
LDL Chol Calc (NIH): 93 mg/dL (ref 0–99)
Triglycerides: 99 mg/dL (ref 0–149)
VLDL Cholesterol Cal: 18 mg/dL (ref 5–40)

## 2023-03-21 LAB — CBC WITH DIFFERENTIAL/PLATELET
Basophils Absolute: 0.1 10*3/uL (ref 0.0–0.2)
Basos: 1 %
EOS (ABSOLUTE): 0.1 10*3/uL (ref 0.0–0.4)
Eos: 2 %
Hematocrit: 40.9 % (ref 37.5–51.0)
Hemoglobin: 13.8 g/dL (ref 13.0–17.7)
Immature Grans (Abs): 0 10*3/uL (ref 0.0–0.1)
Immature Granulocytes: 0 %
Lymphocytes Absolute: 1.4 10*3/uL (ref 0.7–3.1)
Lymphs: 23 %
MCH: 31.1 pg (ref 26.6–33.0)
MCHC: 33.7 g/dL (ref 31.5–35.7)
MCV: 92 fL (ref 79–97)
Monocytes Absolute: 0.5 10*3/uL (ref 0.1–0.9)
Monocytes: 7 %
Neutrophils Absolute: 4.3 10*3/uL (ref 1.4–7.0)
Neutrophils: 67 %
Platelets: 249 10*3/uL (ref 150–450)
RBC: 4.44 x10E6/uL (ref 4.14–5.80)
RDW: 13.5 % (ref 11.6–15.4)
WBC: 6.4 10*3/uL (ref 3.4–10.8)

## 2023-03-24 ENCOUNTER — Other Ambulatory Visit: Payer: Self-pay | Admitting: Nurse Practitioner

## 2023-03-24 DIAGNOSIS — F339 Major depressive disorder, recurrent, unspecified: Secondary | ICD-10-CM

## 2023-03-24 DIAGNOSIS — E781 Pure hyperglyceridemia: Secondary | ICD-10-CM

## 2023-03-24 DIAGNOSIS — E119 Type 2 diabetes mellitus without complications: Secondary | ICD-10-CM

## 2023-03-31 ENCOUNTER — Ambulatory Visit (INDEPENDENT_AMBULATORY_CARE_PROVIDER_SITE_OTHER): Payer: 59 | Admitting: Physician Assistant

## 2023-03-31 ENCOUNTER — Encounter (HOSPITAL_COMMUNITY): Payer: 59

## 2023-03-31 VITALS — BP 169/74 | HR 85 | Temp 98.1°F | Resp 18 | Ht 69.0 in | Wt >= 6400 oz

## 2023-03-31 DIAGNOSIS — I872 Venous insufficiency (chronic) (peripheral): Secondary | ICD-10-CM | POA: Diagnosis not present

## 2023-03-31 NOTE — Progress Notes (Addendum)
[facility] [place]   Reason for referral: Swollen B legs  History of Present Illness  Justin Schmidt is a 42 y.o. male who presents with chief complaint: swollen leg.  Patient notes, onset of swelling years ago, associated with inactivity.  The patient has had a history of DVT, no history of varicose vein, no history of venous stasis ulcers, no history of  Lymphedema and positive history of skin changes in lower legs.  There is a family history of venous disorders.  The patient has used compression stockings in the past.  He was seen in the ED elsewhere with a "bug" bite/cellulitis so bad he was unable to ambulate.  He was placed in a rehabilitation facility to recover.   He is better and no new cellulitis episodes have occurred.  He states he has worn compression socks and he has pumps at home as well.  His sister helps look after him and there father who has dementia.  He lives with a room mate near his sister.  They lost multiple family members during COVID to include their mother.    He is obese and weights > 450 lbs.  He has depression and is managed on Lexapro.  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  He now is not on anticoagulation and has an allergy to Xarelto.      Past Medical History:  Diagnosis Date   Acute saddle pulmonary embolism without acute cor pulmonale (HCC) 01/30/2017   Back pain    Diabetic nephropathy (HCC) 08/22/2021   Gallstones    Hyperlipidemia    Type 2 diabetes mellitus without complication, without long-term current use of insulin (HCC) 08/11/2018   VTE (venous thromboembolism) 05/14/2017    Past Surgical History:  Procedure Laterality Date   APPENDECTOMY  1991   CHOLECYSTECTOMY  01/23/2012   Procedure: LAPAROSCOPIC CHOLECYSTECTOMY;  Surgeon: Fabio Bering, MD;  Location: [facility];  Service: General;  Laterality: N/A;   TONSILLECTOMY      Social History   Socioeconomic History   Marital status: Single    Spouse  name: Not on file   Number of children: Not on file   Years of education: 12th grade   Highest education level: High school graduate  Occupational History   Occupation: disabled  Tobacco Use   Smoking status: Never   Smokeless tobacco: Never  Vaping Use   Vaping status: Never Used  Substance and Sexual Activity   Alcohol use: Yes    Alcohol/week: 1.0 standard drink of alcohol    Types: 1 Cans of beer per week    Comment: very rare   Drug use: No   Sexual activity: Never  Other Topics Concern   Not on file  Social History Narrative   Lives alone - no children   Social Determinants of Health   Financial Resource Strain: Low Risk  (12/18/2022)   Overall Financial Resource Strain (CARDIA)    Difficulty of Paying Living Expenses: Not hard at all  Food Insecurity: No Food Insecurity (01/08/2023)   Hunger Vital Sign    Worried About Running Out of Food in the Last Year: Never true    Ran Out of Food in the Last Year: Never true  Transportation Needs: No Transportation Needs (01/08/2023)   PRAPARE - Administrator, Civil Service (Medical): No    Lack of Transportation (Non-Medical): No  Physical Activity: Insufficiently Active (12/18/2022)   Exercise Vital Sign  Days of Exercise per Week: 3 days    Minutes of Exercise per Session: 30 min  Stress: No Stress Concern Present (12/18/2022)   [employer] of Occupational Health - Occupational Stress Questionnaire    Feeling of Stress : Not at all  Social Connections: Socially Isolated (12/18/2022)   Social Connection and Isolation Panel [NHANES]    Frequency of Communication with Friends and Family: More than three times a week    Frequency of Social Gatherings with Friends and Family: More than three times a week    Attends Religious Services: Never    Database administrator or Organizations: No    Attends Banker Meetings: Never    Marital Status: Never married  Intimate Partner Violence: Not At Risk  (01/08/2023)   Humiliation, Afraid, Rape, and Kick questionnaire    Fear of Current or Ex-Partner: No    Emotionally Abused: No    Physically Abused: No    Sexually Abused: No    Family History  Problem Relation Age of Onset   Diabetes Mother    Diabetes Father    Hypertension Father    Hyperlipidemia Father    Migraines Sister    Diabetes Maternal Uncle    Diabetes Maternal Grandmother    Diabetes Other     Current Outpatient Medications on File Prior to Visit  Medication Sig Dispense Refill   acetaminophen-codeine (TYLENOL #3) 300-30 MG tablet Take 1 tablet by mouth in the morning and at bedtime. 60 tablet 5   Ascorbic Acid (VITAMIN C) 1000 MG tablet Take 1,000 mg by mouth daily.     aspirin EC 81 MG tablet Take 81 mg by mouth every other day.     Cholecalciferol (VITAMIN D) 2000 UNITS tablet Take 2,000 Units by mouth daily.     diclofenac (VOLTAREN) 75 MG EC tablet TAKE 1 TABLET BY MOUTH TWICE  DAILY FOR FOOT PAIN 180 tablet 0   escitalopram (LEXAPRO) 10 MG tablet TAKE 1 TABLET BY MOUTH DAILY 90 tablet 1   Flaxseed, Linseed, (FLAXSEED OIL PO) Take 1 capsule by mouth daily.     fluticasone (FLONASE) 50 MCG/ACT nasal spray SPRAY 2 SPRAYS INTO EACH NOSTRIL EVERY DAY 48 mL 3   furosemide (LASIX) 20 MG tablet Take 1 tablet (20 mg total) by mouth daily. 90 tablet 1   GARLIC PO Take by mouth.     lisinopril (ZESTRIL) 10 MG tablet TAKE 1 TABLET BY MOUTH DAILY 90 tablet 1   Misc Natural Products (BEET ROOT) 500 MG CAPS Take 1 capsule by mouth daily.     Multiple Vitamin (MULTIVITAMIN WITH MINERALS) TABS Take 1 tablet by mouth daily.     rosuvastatin (CRESTOR) 5 MG tablet TAKE 1 TABLET BY MOUTH DAILY 90 tablet 1   sitaGLIPtin (JANUVIA) 50 MG tablet Take 1 tablet (50 mg total) by mouth daily. 90 tablet 1   Zinc Acetate, Oral, (ZINC ACETATE PO) Take 1 tablet by mouth daily.     Current Facility-Administered Medications on File Prior to Visit  Medication Dose Route Frequency Provider  Last Rate Last Admin   sodium chloride irrigation 0.9 %    PRN Tilford Pillar, MD   3,000 mL at 01/27/12 0739    Allergies as of 03/31/2023 - Review Complete 03/31/2023  Allergen Reaction Noted   Metformin and related  10/24/2019   Xarelto [rivaroxaban]  10/21/2019     ROS:   General:  No weight loss, Fever, chills  HEENT: No recent headaches,  no nasal bleeding, no visual changes, no sore throat  Neurologic: No dizziness, blackouts, seizures. No recent symptoms of stroke or mini- stroke. No recent episodes of slurred speech, or temporary blindness.  Cardiac: No recent episodes of chest pain/pressure, no shortness of breath at rest.  No shortness of breath with exertion.  Denies history of atrial fibrillation or irregular heartbeat  Vascular: No history of rest pain in feet.  No history of claudication.  No history of non-healing ulcer, positive history of DVT   Pulmonary: No home oxygen, no productive cough, no hemoptysis,  No asthma or wheezing  Musculoskeletal:  [ ]  Arthritis, [ ]  Low back pain,  [ ]  Joint pain  Hematologic:No history of hypercoagulable state.  No history of easy bleeding.  No history of anemia  Gastrointestinal: No hematochezia or melena,  No gastroesophageal reflux, no trouble swallowing  Urinary: [ ]  chronic Kidney disease, [ ]  on HD - [ ]  MWF or [ ]  TTHS, [ ]  Burning with urination, [ ]  Frequent urination, [ ]  Difficulty urinating;   Skin: No rashes  Psychological: No history of anxiety,  No history of depression  Physical Examination  Vitals:   03/31/23 1428  BP: (!) 169/74  Pulse: 85  Resp: 18  Temp: 98.1 F (36.7 C)  TempSrc: Temporal  SpO2: 94%  Weight: (!) 433 lb 9.6 oz (196.7 kg)  Height: 5\' 9"  (1.753 m)    Body mass index is 64.03 kg/m.  General:  Alert and oriented, no acute distress HEENT: Normal Neck: No bruit or JVD Pulmonary: Clear to auscultation bilaterally Cardiac: Regular Rate and Rhythm without murmur Abdomen: Soft,  non-tender, non-distended, no mass, no scars Skin: No rash Extremity Pulses:  radial,  biphasic doppler flow dorsalis pedis, posterior tibial pulses bilaterally Musculoskeletal: No deformity or edema  Neurologic: Upper and lower extremity motor intact and symmetric     DATA: None  Assessment/Plan: Venous reflux/insufficiency with history of DVT and cellulitis.   No new studies where done today.  I suggested he work on exercise low weights with high reps and lose weight.  He states he has a gym at home.  Eat healthier and use his compression socks daily.  I will have him return in 6 months to see if he has made progress and I ordered a right LE reflux study.  He will likely benefit most from weight loss and then we can approach intervention of the venous issues.    He agrees to try to get his body back in shape and take better care of himself.        Mosetta Pigeon PA-C [facility] [place] Office: [phone]  MD in clinic [place]

## 2023-04-02 ENCOUNTER — Other Ambulatory Visit: Payer: Self-pay | Admitting: Nurse Practitioner

## 2023-04-02 DIAGNOSIS — M545 Low back pain, unspecified: Secondary | ICD-10-CM

## 2023-04-02 DIAGNOSIS — G8929 Other chronic pain: Secondary | ICD-10-CM

## 2023-04-03 ENCOUNTER — Other Ambulatory Visit: Payer: Self-pay

## 2023-04-03 DIAGNOSIS — I872 Venous insufficiency (chronic) (peripheral): Secondary | ICD-10-CM

## 2023-04-10 ENCOUNTER — Other Ambulatory Visit: Payer: Self-pay | Admitting: Nurse Practitioner

## 2023-04-10 DIAGNOSIS — R609 Edema, unspecified: Secondary | ICD-10-CM

## 2023-05-17 ENCOUNTER — Other Ambulatory Visit: Payer: Self-pay | Admitting: Nurse Practitioner

## 2023-05-17 DIAGNOSIS — E119 Type 2 diabetes mellitus without complications: Secondary | ICD-10-CM

## 2023-06-04 ENCOUNTER — Other Ambulatory Visit: Payer: Self-pay | Admitting: Nurse Practitioner

## 2023-06-04 DIAGNOSIS — M545 Low back pain, unspecified: Secondary | ICD-10-CM

## 2023-06-04 DIAGNOSIS — G8929 Other chronic pain: Secondary | ICD-10-CM

## 2023-06-14 ENCOUNTER — Other Ambulatory Visit: Payer: Self-pay | Admitting: Nurse Practitioner

## 2023-06-14 DIAGNOSIS — E781 Pure hyperglyceridemia: Secondary | ICD-10-CM

## 2023-06-14 DIAGNOSIS — E119 Type 2 diabetes mellitus without complications: Secondary | ICD-10-CM

## 2023-06-16 ENCOUNTER — Ambulatory Visit: Payer: 59 | Admitting: Nurse Practitioner

## 2023-06-19 ENCOUNTER — Encounter: Payer: Self-pay | Admitting: Nurse Practitioner

## 2023-06-19 ENCOUNTER — Ambulatory Visit (INDEPENDENT_AMBULATORY_CARE_PROVIDER_SITE_OTHER): Payer: 59 | Admitting: Nurse Practitioner

## 2023-06-19 VITALS — BP 149/84 | HR 85 | Temp 97.2°F | Ht 69.0 in | Wt >= 6400 oz

## 2023-06-19 DIAGNOSIS — Z7984 Long term (current) use of oral hypoglycemic drugs: Secondary | ICD-10-CM | POA: Diagnosis not present

## 2023-06-19 DIAGNOSIS — E119 Type 2 diabetes mellitus without complications: Secondary | ICD-10-CM

## 2023-06-19 DIAGNOSIS — R609 Edema, unspecified: Secondary | ICD-10-CM

## 2023-06-19 DIAGNOSIS — E1169 Type 2 diabetes mellitus with other specified complication: Secondary | ICD-10-CM | POA: Diagnosis not present

## 2023-06-19 DIAGNOSIS — E785 Hyperlipidemia, unspecified: Secondary | ICD-10-CM

## 2023-06-19 DIAGNOSIS — I10 Essential (primary) hypertension: Secondary | ICD-10-CM | POA: Diagnosis not present

## 2023-06-19 DIAGNOSIS — F339 Major depressive disorder, recurrent, unspecified: Secondary | ICD-10-CM | POA: Diagnosis not present

## 2023-06-19 DIAGNOSIS — Z6841 Body Mass Index (BMI) 40.0 and over, adult: Secondary | ICD-10-CM

## 2023-06-19 LAB — BAYER DCA HB A1C WAIVED: HB A1C (BAYER DCA - WAIVED): 7.7 % — ABNORMAL HIGH (ref 4.8–5.6)

## 2023-06-19 MED ORDER — SITAGLIPTIN PHOSPHATE 50 MG PO TABS: 50.0000 mg | ORAL_TABLET | Freq: Every day | ORAL | 0 refills | Status: DC

## 2023-06-19 MED ORDER — FUROSEMIDE 20 MG PO TABS: 20.0000 mg | ORAL_TABLET | Freq: Every day | ORAL | 0 refills | Status: DC

## 2023-06-19 NOTE — Progress Notes (Signed)
 Subjective:    Patient ID: Justin Schmidt, male    DOB: November 09, 1980, 43 y.o.   MRN: [id number]   Chief Complaint: medical management of chronic issues     HPI:  Justin Schmidt is a 43 y.o. who identifies as a male who was assigned male at birth.   Social history: Lives with: a friend Work history: disability   Comes in today for follow up of the following chronic medical issues:  1. Essential hypertension No c/o chest pain, sob or headache. Does not check blood pressure at home BP Readings from Last 3 Encounters:  03/31/23 (!) 169/74  03/20/23 (!) 147/93  02/04/23 123/69     2. Hyperlipidemia associated with type 2 diabetes mellitus (HCC) Does not watch diet. Says he works out in home gym almost daily Lab Results  Component Value Date   CHOL 154 03/20/2023   HDL 43 03/20/2023   LDLCALC 93 03/20/2023   TRIG 99 03/20/2023   CHOLHDL 3.6 03/20/2023     3. Type 2 diabetes mellitus without complication, without long-term current use of insulin (HCC) Fasting blood sugars are running around 120-140. He has really been trying to watch diet Lab Results  Component Value Date   HGBA1C 7.0 (H) 03/20/2023     4. Depression, recurrent (HCC) Is on lexapro and is doing well    03/20/2023    2:30 PM 01/06/2023    3:07 PM 12/22/2022    3:00 PM  Depression screen PHQ 2/9  Decreased Interest 0 0 0  Down, Depressed, Hopeless 1 0 0  PHQ - 2 Score 1 0 0  Altered sleeping 1 0 1  Tired, decreased energy 0 0 1  Change in appetite 0 0 0  Feeling bad or failure about yourself  1 0 0  Trouble concentrating 0 0 0  Moving slowly or fidgety/restless 0 0 0  Suicidal thoughts 0 0 0  PHQ-9 Score 3 0 2  Difficult doing work/chores Somewhat difficult Not difficult at all Somewhat difficult      5. Morbid obesity with BMI of 60.0-69.9, adult (HCC) Weight is up 7 lbs  Wt Readings from Last 3 Encounters:  03/31/23 (!) 433 lb 9.6 oz (196.7 kg)  03/20/23 (!) 426 lb (193.2 kg)  02/04/23  (!) 426 lb 9.6 oz (193.5 kg)   BMI Readings from Last 3 Encounters:  03/31/23 64.03 kg/m  03/20/23 62.91 kg/m  02/04/23 63.00 kg/m       New complaints: None today  Allergies  Allergen Reactions   Metformin And Related     diarrhea   Xarelto [Rivaroxaban]     DROWSINESS, FATIGUE   Outpatient Encounter Medications as of 06/19/2023  Medication Sig   acetaminophen-codeine (TYLENOL #3) 300-30 MG tablet Take 1 tablet by mouth in the morning and at bedtime.   Ascorbic Acid (VITAMIN C) 1000 MG tablet Take 1,000 mg by mouth daily.   aspirin EC 81 MG tablet Take 81 mg by mouth every other day.   Cholecalciferol (VITAMIN D) 2000 UNITS tablet Take 2,000 Units by mouth daily.   diclofenac (VOLTAREN) 75 MG EC tablet TAKE 1 TABLET BY MOUTH TWICE  DAILY FOR FOOT PAIN   escitalopram (LEXAPRO) 10 MG tablet TAKE 1 TABLET BY MOUTH DAILY   Flaxseed, Linseed, (FLAXSEED OIL PO) Take 1 capsule by mouth daily.   fluticasone (FLONASE) 50 MCG/ACT nasal spray SPRAY 2 SPRAYS INTO EACH NOSTRIL EVERY DAY   furosemide (LASIX) 20 MG tablet TAKE 1 TABLET  BY MOUTH DAILY   GARLIC PO Take by mouth.   lisinopril (ZESTRIL) 10 MG tablet TAKE 1 TABLET BY MOUTH DAILY   Misc Natural Products (BEET ROOT) 500 MG CAPS Take 1 capsule by mouth daily.   Multiple Vitamin (MULTIVITAMIN WITH MINERALS) TABS Take 1 tablet by mouth daily.   rosuvastatin (CRESTOR) 5 MG tablet TAKE 1 TABLET BY MOUTH DAILY   sitaGLIPtin (JANUVIA) 50 MG tablet TAKE 1 TABLET BY MOUTH DAILY   Zinc Acetate, Oral, (ZINC ACETATE PO) Take 1 tablet by mouth daily.   Facility-Administered Encounter Medications as of 06/19/2023  Medication   sodium chloride irrigation 0.9 %    Past Surgical History:  Procedure Laterality Date   APPENDECTOMY  1991   CHOLECYSTECTOMY  01/23/2012   Procedure: LAPAROSCOPIC CHOLECYSTECTOMY;  Surgeon: Fabio Bering, MD;  Location: [facility];  Service: General;  Laterality: N/A;   TONSILLECTOMY      Family History   Problem Relation Age of Onset   Diabetes Mother    Diabetes Father    Hypertension Father    Hyperlipidemia Father    Migraines Sister    Diabetes Maternal Uncle    Diabetes Maternal Grandmother    Diabetes Other       Controlled substance contract: n/a     Review of Systems  Constitutional:  Negative for diaphoresis.  Eyes:  Negative for pain.  Respiratory:  Negative for shortness of breath.   Cardiovascular:  Negative for chest pain, palpitations and leg swelling.  Gastrointestinal:  Negative for abdominal pain.  Endocrine: Negative for polydipsia.  Skin:  Negative for rash.  Neurological:  Negative for dizziness, weakness and headaches.  Hematological:  Does not bruise/bleed easily.  All other systems reviewed and are negative.      Objective:   Physical Exam Vitals and nursing note reviewed.  Constitutional:      Appearance: Normal appearance. He is well-developed.  HENT:     Head: Normocephalic.     Nose: Nose normal.     Mouth/Throat:     Mouth: Mucous membranes are moist.     Pharynx: Oropharynx is clear.  Eyes:     Pupils: Pupils are equal, round, and reactive to light.  Neck:     Thyroid: No thyroid mass or thyromegaly.     Vascular: No carotid bruit or JVD.     Trachea: Phonation normal.  Cardiovascular:     Rate and Rhythm: Normal rate and regular rhythm.  Pulmonary:     Effort: Pulmonary effort is normal. No respiratory distress.     Breath sounds: Normal breath sounds.  Abdominal:     General: Bowel sounds are normal.     Palpations: Abdomen is soft.     Tenderness: There is no abdominal tenderness.     Hernia: A hernia (nontender umbilical hernia) is present.  Musculoskeletal:        General: Normal range of motion.     Cervical back: Normal range of motion and neck supple.     Right lower leg: Edema (2+) present.     Left lower leg: Edema (2+) present.  Lymphadenopathy:     Cervical: No cervical adenopathy.  Skin:    General: Skin is  warm and dry.     Comments: Circulatory skin changes bil lower ext  Neurological:     Mental Status: He is alert and oriented to person, place, and time.  Psychiatric:        Behavior: Behavior normal.  Thought Content: Thought content normal.        Judgment: Judgment normal.     BP (!) 149/84   Pulse 85   Temp (!) 97.2 F (36.2 C) (Temporal)   Ht 5\' 9"  (1.753 m)   Wt (!) 448 lb (203.2 kg)   SpO2 93%   BMI 66.16 kg/m    Hgba1c  7.7%     Assessment & Plan:   Justin Schmidt comes in today with chief complaint of No chief complaint on file.   Diagnosis and orders addressed:  1. Essential hypertension Low fat diet - CBC with Differential/Platelet - CMP14+EGFR  2. Hyperlipidemia associated with type 2 diabetes mellitus (HCC) Low fat diet - Lipid panel  3. Type 2 diabetes mellitus without complication, without long-term current use of insulin (HCC) Strict carb counting Back on diet Start back exercising - Bayer DCA Hb A1c Waived - rosuvastatin (CRESTOR) 5 MG tablet; Take 1 tablet (5 mg total) by mouth daily.  Dispense: 90 tablet; Refill: 1 - lisinopril (ZESTRIL) 10 MG tablet; Take 1 tablet (10 mg total) by mouth daily.  Dispense: 90 tablet; Refill: 1 - sitaGLIPtin (JANUVIA) 50 MG tablet; Take 1 tablet (50 mg total) by mouth daily.  Dispense: 90 tablet; Refill: 1  4. Depression, recurrent (HCC) Stress management - escitalopram (LEXAPRO) 10 MG tablet; Take 1 tablet (10 mg total) by mouth daily.  Dispense: 90 tablet; Refill: 1  5. Morbid obesity with BMI of 60.0-69.9, adult (HCC) Discussed diet and exercise for person with BMI >25 Will recheck weight in 3-6 months  6. Swelling - furosemide (LASIX) 20 MG tablet; Take 1 tablet (20 mg total) by mouth daily.  Dispense: 90 tablet; Refill: 1   Labs pending Health Maintenance reviewed Diet and exercise encouraged  Follow up plan: 3 months   [name] Daphine Deutscher, FNP

## 2023-06-19 NOTE — Patient Instructions (Signed)

## 2023-06-20 LAB — CMP14+EGFR
ALT: 38 IU/L (ref 0–44)
AST: 24 IU/L (ref 0–40)
Albumin: 4.2 g/dL (ref 4.1–5.1)
Alkaline Phosphatase: 66 IU/L (ref 44–121)
BUN/Creatinine Ratio: 13 (ref 9–20)
BUN: 14 mg/dL (ref 6–24)
Bilirubin Total: 0.3 mg/dL (ref 0.0–1.2)
CO2: 23 mmol/L (ref 20–29)
Calcium: 9.3 mg/dL (ref 8.7–10.2)
Chloride: 102 mmol/L (ref 96–106)
Creatinine, Ser: 1.08 mg/dL (ref 0.76–1.27)
Globulin, Total: 2.8 g/dL (ref 1.5–4.5)
Glucose: 143 mg/dL — ABNORMAL HIGH (ref 70–99)
Potassium: 4.6 mmol/L (ref 3.5–5.2)
Sodium: 140 mmol/L (ref 134–144)
Total Protein: 7 g/dL (ref 6.0–8.5)
eGFR: 87 mL/min/{1.73_m2} (ref >59)

## 2023-06-20 LAB — CBC WITH DIFFERENTIAL/PLATELET
Basophils Absolute: 0 10*3/uL (ref 0.0–0.2)
Basos: 1 %
EOS (ABSOLUTE): 0.1 10*3/uL (ref 0.0–0.4)
Eos: 2 %
Hematocrit: 42.6 % (ref 37.5–51.0)
Hemoglobin: 14.3 g/dL (ref 13.0–17.7)
Immature Grans (Abs): 0 10*3/uL (ref 0.0–0.1)
Immature Granulocytes: 0 %
Lymphocytes Absolute: 1.3 10*3/uL (ref 0.7–3.1)
Lymphs: 26 %
MCH: 31.3 pg (ref 26.6–33.0)
MCHC: 33.6 g/dL (ref 31.5–35.7)
MCV: 93 fL (ref 79–97)
Monocytes Absolute: 0.3 10*3/uL (ref 0.1–0.9)
Monocytes: 7 %
Neutrophils Absolute: 3.3 10*3/uL (ref 1.4–7.0)
Neutrophils: 64 %
Platelets: 259 10*3/uL (ref 150–450)
RBC: 4.57 x10E6/uL (ref 4.14–5.80)
RDW: 12.7 % (ref 11.6–15.4)
WBC: 5 10*3/uL (ref 3.4–10.8)

## 2023-06-20 LAB — LIPID PANEL
Chol/HDL Ratio: 4 ratio (ref 0.0–5.0)
Cholesterol, Total: 167 mg/dL (ref 100–199)
HDL: 42 mg/dL (ref >39)
LDL Chol Calc (NIH): 103 mg/dL — ABNORMAL HIGH (ref 0–99)
Triglycerides: 119 mg/dL (ref 0–149)
VLDL Cholesterol Cal: 22 mg/dL (ref 5–40)

## 2023-07-01 ENCOUNTER — Other Ambulatory Visit: Payer: Self-pay | Admitting: Nurse Practitioner

## 2023-07-01 DIAGNOSIS — R609 Edema, unspecified: Secondary | ICD-10-CM

## 2023-07-19 ENCOUNTER — Other Ambulatory Visit: Payer: Self-pay | Admitting: Nurse Practitioner

## 2023-07-19 DIAGNOSIS — E119 Type 2 diabetes mellitus without complications: Secondary | ICD-10-CM

## 2023-07-21 ENCOUNTER — Other Ambulatory Visit: Payer: Self-pay | Admitting: Nurse Practitioner

## 2023-07-21 DIAGNOSIS — M545 Low back pain, unspecified: Secondary | ICD-10-CM

## 2023-08-06 ENCOUNTER — Other Ambulatory Visit: Payer: Self-pay | Admitting: Nurse Practitioner

## 2023-08-06 DIAGNOSIS — G8929 Other chronic pain: Secondary | ICD-10-CM

## 2023-08-12 LAB — MICROALBUMIN / CREATININE URINE RATIO: Microalb Creat Ratio: 29.999

## 2023-08-12 LAB — LAB REPORT - SCANNED
Creatinine, POC: 0.98 mg/dL
EGFR: 90

## 2023-08-25 ENCOUNTER — Other Ambulatory Visit: Payer: Self-pay | Admitting: Nurse Practitioner

## 2023-08-25 DIAGNOSIS — F339 Major depressive disorder, recurrent, unspecified: Secondary | ICD-10-CM

## 2023-09-08 ENCOUNTER — Ambulatory Visit (INDEPENDENT_AMBULATORY_CARE_PROVIDER_SITE_OTHER): Payer: 59 | Admitting: Nurse Practitioner

## 2023-09-08 ENCOUNTER — Encounter: Payer: Self-pay | Admitting: Nurse Practitioner

## 2023-09-08 VITALS — BP 142/88 | HR 94 | Temp 97.8°F | Ht 69.0 in | Wt >= 6400 oz

## 2023-09-08 DIAGNOSIS — Z6841 Body Mass Index (BMI) 40.0 and over, adult: Secondary | ICD-10-CM

## 2023-09-08 DIAGNOSIS — E1169 Type 2 diabetes mellitus with other specified complication: Secondary | ICD-10-CM

## 2023-09-08 DIAGNOSIS — E119 Type 2 diabetes mellitus without complications: Secondary | ICD-10-CM

## 2023-09-08 DIAGNOSIS — I10 Essential (primary) hypertension: Secondary | ICD-10-CM

## 2023-09-08 DIAGNOSIS — E785 Hyperlipidemia, unspecified: Secondary | ICD-10-CM

## 2023-09-08 DIAGNOSIS — F339 Major depressive disorder, recurrent, unspecified: Secondary | ICD-10-CM | POA: Diagnosis not present

## 2023-09-08 DIAGNOSIS — Z7984 Long term (current) use of oral hypoglycemic drugs: Secondary | ICD-10-CM | POA: Diagnosis not present

## 2023-09-08 LAB — LIPID PANEL

## 2023-09-08 NOTE — Patient Instructions (Signed)
 Exercising to Stay Healthy To become healthy and stay healthy, it is recommended that you do moderate-intensity and vigorous-intensity exercise. You can tell that you are exercising at a moderate intensity if your heart starts beating faster and you start breathing faster but can still hold a conversation. You can tell that you are exercising at a vigorous intensity if you are breathing much harder and faster and cannot hold a conversation while exercising. How can exercise benefit me? Exercising regularly is important. It has many health benefits, such as: Improving overall fitness, flexibility, and endurance. Increasing bone density. Helping with weight control. Decreasing body fat. Increasing muscle strength and endurance. Reducing stress and tension, anxiety, depression, or anger. Improving overall health. What guidelines should I follow while exercising? Before you start a new exercise program, talk with your health care provider. Do not exercise so much that you hurt yourself, feel dizzy, or get very short of breath. Wear comfortable clothes and wear shoes with good support. Drink plenty of water while you exercise to prevent dehydration or heat stroke. Work out until your breathing and your heartbeat get faster (moderate intensity). How often should I exercise? Choose an activity that you enjoy, and set realistic goals. Your health care provider can help you make an activity plan that is individually designed and works best for you. Exercise regularly as told by your health care provider. This may include: Doing strength training two times a week, such as: Lifting weights. Using resistance bands. Push-ups. Sit-ups. Yoga. Doing a certain intensity of exercise for a given amount of time. Choose from these options: A total of 150 minutes of moderate-intensity exercise every week. A total of 75 minutes of vigorous-intensity exercise every week. A mix of moderate-intensity and  vigorous-intensity exercise every week. Children, pregnant women, people who have not exercised regularly, people who are overweight, and older adults may need to talk with a health care provider about what activities are safe to perform. If you have a medical condition, be sure to talk with your health care provider before you start a new exercise program. What are some exercise ideas? Moderate-intensity exercise ideas include: Walking 1 mile (1.6 km) in about 15 minutes. Biking. Hiking. Golfing. Dancing. Water aerobics. Vigorous-intensity exercise ideas include: Walking 4.5 miles (7.2 km) or more in about 1 hour. Jogging or running 5 miles (8 km) in about 1 hour. Biking 10 miles (16.1 km) or more in about 1 hour. Lap swimming. Roller-skating or in-line skating. Cross-country skiing. Vigorous competitive sports, such as football, basketball, and soccer. Jumping rope. Aerobic dancing. What are some everyday activities that can help me get exercise? Yard work, such as: Child psychotherapist. Raking and bagging leaves. Washing your car. Pushing a stroller. Shoveling snow. Gardening. Washing windows or floors. How can I be more active in my day-to-day activities? Use stairs instead of an elevator. Take a walk during your lunch break. If you drive, park your car farther away from your work or school. If you take public transportation, get off one stop early and walk the rest of the way. Stand up or walk around during all of your indoor phone calls. Get up, stretch, and walk around every 30 minutes throughout the day. Enjoy exercise with a friend. Support to continue exercising will help you keep a regular routine of activity. Where to find more information You can find more information about exercising to stay healthy from: U.[place]. Department of Health and Human Services: ThisPath.fi Centers for Disease Control and Prevention (  CDC): FootballExhibition.com.br Summary Exercising regularly is  important. It will improve your overall fitness, flexibility, and endurance. Regular exercise will also improve your overall health. It can help you control your weight, reduce stress, and improve your bone density. Do not exercise so much that you hurt yourself, feel dizzy, or get very short of breath. Before you start a new exercise program, talk with your health care provider. This information is not intended to replace advice given to you by your health care provider. Make sure you discuss any questions you have with your health care provider. Document Revised: 08/10/2020 Document Reviewed: 08/10/2020 Elsevier Patient Education  2024 [employer].

## 2023-09-08 NOTE — Progress Notes (Signed)
 Subjective:    Patient ID: Justin Schmidt, male    DOB: 03-Dec-1980, 43 y.o.   MRN: [id number]   Chief Complaint: medical management of chronic issues     HPI:  Justin Schmidt is a 43 y.o. who identifies as a male who was assigned male at birth.   Social history: Lives with: a friend Work history: disability   Comes in today for follow up of the following chronic medical issues:  1. Essential hypertension No c/o chest pain, sob or headache. Does not check blood pressure at home BP Readings from Last 3 Encounters:  06/19/23 (!) 149/84  03/31/23 (!) 169/74  03/20/23 (!) 147/93     2. Hyperlipidemia associated with type 2 diabetes mellitus (HCC) Does not watch diet. Says he works out in home gym almost daily Lab Results  Component Value Date   CHOL 167 06/19/2023   HDL 42 06/19/2023   LDLCALC 103 (H) 06/19/2023   TRIG 119 06/19/2023   CHOLHDL 4.0 06/19/2023   The 10-year ASCVD risk score (Arnett DK, et al., 2019) is: 4.3%   3. Type 2 diabetes mellitus without complication, without long-term current use of insulin  (HCC) Fasting blood sugars are running around 140-170. He has really been trying to watch diet Lab Results  Component Value Date   HGBA1C 7.7 (H) 06/19/2023     4. Depression, recurrent (HCC) Is on lexapro  and is doing well    09/08/2023    3:28 PM 06/19/2023    2:57 PM 03/20/2023    2:30 PM  Depression screen PHQ 2/9  Decreased Interest 0 1 0  Down, Depressed, Hopeless 0 1 1  PHQ - 2 Score 0 2 1  Altered sleeping  0 1  Tired, decreased energy  0 0  Change in appetite  0 0  Feeling bad or failure about yourself   1 1  Trouble concentrating  0 0  Moving slowly or fidgety/restless  0 0  Suicidal thoughts  0 0  PHQ-9 Score  3 3  Difficult doing work/chores  Somewhat difficult Somewhat difficult       5. Morbid obesity with BMI of 60.0-69.9, adult (HCC) Weight is down 2lbs  Wt Readings from Last 3 Encounters:  09/08/23 (!) 446 lb (202.3 kg)   06/19/23 (!) 448 lb (203.2 kg)  03/31/23 (!) 433 lb 9.6 oz (196.7 kg)   BMI Readings from Last 3 Encounters:  09/08/23 65.86 kg/m  06/19/23 66.16 kg/m  03/31/23 64.03 kg/m           New complaints: None today  Allergies  Allergen Reactions   Metformin  And Related     diarrhea   Xarelto  [Rivaroxaban ]     DROWSINESS, FATIGUE   Outpatient Encounter Medications as of 09/08/2023  Medication Sig   acetaminophen -codeine  (TYLENOL  #3) 300-30 MG tablet Take 1 tablet by mouth in the morning and at bedtime.   Ascorbic Acid (VITAMIN C) 1000 MG tablet Take 1,000 mg by mouth daily.   aspirin EC 81 MG tablet Take 81 mg by mouth every other day.   Cholecalciferol (VITAMIN D) 2000 UNITS tablet Take 2,000 Units by mouth daily.   diclofenac  (VOLTAREN ) 75 MG EC tablet TAKE 1 TABLET BY MOUTH TWICE  DAILY FOR FOOT PAIN   escitalopram  (LEXAPRO ) 10 MG tablet TAKE 1 TABLET BY MOUTH DAILY   Flaxseed, Linseed, (FLAXSEED OIL PO) Take 1 capsule by mouth daily.   fluticasone  (FLONASE ) 50 MCG/ACT nasal spray SPRAY 2 SPRAYS INTO [facility]  NOSTRIL EVERY DAY   furosemide  (LASIX ) 20 MG tablet TAKE 1 TABLET BY MOUTH DAILY   GARLIC PO Take by mouth.   lisinopril  (ZESTRIL ) 10 MG tablet TAKE 1 TABLET BY MOUTH DAILY   Misc Natural Products (BEET ROOT) 500 MG CAPS Take 1 capsule by mouth daily.   Multiple Vitamin (MULTIVITAMIN WITH MINERALS) TABS Take 1 tablet by mouth daily.   rosuvastatin  (CRESTOR ) 5 MG tablet TAKE 1 TABLET BY MOUTH DAILY   sitaGLIPtin  (JANUVIA ) 50 MG tablet TAKE 1 TABLET BY MOUTH DAILY   Zinc Acetate, Oral, (ZINC ACETATE PO) Take 1 tablet by mouth daily.   Facility-Administered Encounter Medications as of 09/08/2023  Medication   sodium chloride  irrigation 0.9 %    Past Surgical History:  Procedure Laterality Date   APPENDECTOMY  1991   CHOLECYSTECTOMY  01/23/2012   Procedure: LAPAROSCOPIC CHOLECYSTECTOMY;  Surgeon: Lovena Rubinstein, MD;  Location: [facility];  Service: General;  Laterality:  N/A;   TONSILLECTOMY      Family History  Problem Relation Age of Onset   Diabetes Mother    Diabetes Father    Hypertension Father    Hyperlipidemia Father    Migraines Sister    Diabetes Maternal Uncle    Diabetes Maternal Grandmother    Diabetes Other       Controlled substance contract: n/a     Review of Systems  Constitutional:  Negative for diaphoresis.  Eyes:  Negative for pain.  Respiratory:  Negative for shortness of breath.   Cardiovascular:  Negative for chest pain, palpitations and leg swelling.  Gastrointestinal:  Negative for abdominal pain.  Endocrine: Negative for polydipsia.  Skin:  Negative for rash.  Neurological:  Negative for dizziness, weakness and headaches.  Hematological:  Does not bruise/bleed easily.  All other systems reviewed and are negative.      Objective:   Physical Exam Vitals and nursing note reviewed.  Constitutional:      Appearance: Normal appearance. He is well-developed.  HENT:     Head: Normocephalic.     Nose: Nose normal.     Mouth/Throat:     Mouth: Mucous membranes are moist.     Pharynx: Oropharynx is clear.  Eyes:     Pupils: Pupils are equal, round, and reactive to light.  Neck:     Thyroid : No thyroid  mass or thyromegaly.     Vascular: No carotid bruit or JVD.     Trachea: Phonation normal.  Cardiovascular:     Rate and Rhythm: Normal rate and regular rhythm.  Pulmonary:     Effort: Pulmonary effort is normal. No respiratory distress.     Breath sounds: Normal breath sounds.  Abdominal:     General: Bowel sounds are normal.     Palpations: Abdomen is soft.     Tenderness: There is no abdominal tenderness.     Hernia: A hernia (nontender umbilical hernia) is present.  Musculoskeletal:        General: Normal range of motion.     Cervical back: Normal range of motion and neck supple.     Right lower leg: Edema (2+) present.     Left lower leg: Edema (2+) present.  Lymphadenopathy:     Cervical: No  cervical adenopathy.  Skin:    General: Skin is warm and dry.     Comments: Circulatory skin changes bil lower ext  Neurological:     Mental Status: He is alert and oriented to person, place, and time.  Psychiatric:  Behavior: Behavior normal.        Thought Content: Thought content normal.        Judgment: Judgment normal.     BP (!) 142/88 (BP Location: Left Wrist)   Pulse 94   Temp 97.8 F (36.6 C) (Temporal)   Ht 5\' 9"  (1.753 m)   Wt (!) 446 lb (202.3 kg)   SpO2 92%   BMI 65.86 kg/m      Hgba1c  7.7%     Assessment & Plan:   Justin Schmidt comes in today with chief complaint of Medical Management of Chronic Issues   Diagnosis and orders addressed:  1. Essential hypertension Low fat diet - CBC with Differential/Platelet - CMP14+EGFR  2. Hyperlipidemia associated with type 2 diabetes mellitus (HCC) Low fat diet - Lipid panel  3. Type 2 diabetes mellitus without complication, without long-term current use of insulin  (HCC) Strict carb counting Back on diet Start back exercising - Bayer DCA Hb A1c Waived - rosuvastatin  (CRESTOR ) 5 MG tablet; Take 1 tablet (5 mg total) by mouth daily.  Dispense: 90 tablet; Refill: 1 - lisinopril  (ZESTRIL ) 10 MG tablet; Take 1 tablet (10 mg total) by mouth daily.  Dispense: 90 tablet; Refill: 1 - sitaGLIPtin  (JANUVIA ) 50 MG tablet; Take 1 tablet (50 mg total) by mouth daily.  Dispense: 90 tablet; Refill: 1  4. Depression, recurrent (HCC) Stress management - escitalopram  (LEXAPRO ) 10 MG tablet; Take 1 tablet (10 mg total) by mouth daily.  Dispense: 90 tablet; Refill: 1  5. Morbid obesity with BMI of 60.0-69.9, adult (HCC) Discussed diet and exercise for person with BMI >25 Will recheck weight in 3-6 months  6. Swelling - furosemide  (LASIX ) 20 MG tablet; Take 1 tablet (20 mg total) by mouth daily.  Dispense: 90 tablet; Refill: 1   Labs pending Health Maintenance reviewed Diet and exercise encouraged  Follow up  plan: 3 months   [name] Gaylyn Keas, FNP

## 2023-09-09 LAB — CMP14+EGFR
ALT: 43 IU/L (ref 0–44)
AST: 22 IU/L (ref 0–40)
Albumin: 4.2 g/dL (ref 4.1–5.1)
Alkaline Phosphatase: 73 IU/L (ref 44–121)
BUN/Creatinine Ratio: 15 (ref 9–20)
BUN: 16 mg/dL (ref 6–24)
Bilirubin Total: 0.2 mg/dL (ref 0.0–1.2)
CO2: 22 mmol/L (ref 20–29)
Calcium: 9.8 mg/dL (ref 8.7–10.2)
Chloride: 102 mmol/L (ref 96–106)
Creatinine, Ser: 1.04 mg/dL (ref 0.76–1.27)
Globulin, Total: 2.6 g/dL (ref 1.5–4.5)
Glucose: 156 mg/dL — ABNORMAL HIGH (ref 70–99)
Potassium: 4.5 mmol/L (ref 3.5–5.2)
Sodium: 141 mmol/L (ref 134–144)
Total Protein: 6.8 g/dL (ref 6.0–8.5)
eGFR: 91 mL/min/{1.73_m2} (ref >59)

## 2023-09-09 LAB — CBC WITH DIFFERENTIAL/PLATELET
Basophils Absolute: 0 10*3/uL (ref 0.0–0.2)
Basos: 1 %
EOS (ABSOLUTE): 0.1 10*3/uL (ref 0.0–0.4)
Eos: 2 %
Hematocrit: 43.3 % (ref 37.5–51.0)
Hemoglobin: 14.6 g/dL (ref 13.0–17.7)
Immature Grans (Abs): 0 10*3/uL (ref 0.0–0.1)
Immature Granulocytes: 0 %
Lymphocytes Absolute: 1.4 10*3/uL (ref 0.7–3.1)
Lymphs: 21 %
MCH: 31.2 pg (ref 26.6–33.0)
MCHC: 33.7 g/dL (ref 31.5–35.7)
MCV: 93 fL (ref 79–97)
Monocytes Absolute: 0.4 10*3/uL (ref 0.1–0.9)
Monocytes: 7 %
Neutrophils Absolute: 4.7 10*3/uL (ref 1.4–7.0)
Neutrophils: 69 %
Platelets: 244 10*3/uL (ref 150–450)
RBC: 4.68 x10E6/uL (ref 4.14–5.80)
RDW: 13.1 % (ref 11.6–15.4)
WBC: 6.6 10*3/uL (ref 3.4–10.8)

## 2023-09-09 LAB — LIPID PANEL
Cholesterol, Total: 167 mg/dL (ref 100–199)
HDL: 44 mg/dL (ref >39)
LDL CALC COMMENT:: 3.8 ratio (ref 0.0–5.0)
LDL Chol Calc (NIH): 98 mg/dL (ref 0–99)
Triglycerides: 140 mg/dL (ref 0–149)
VLDL Cholesterol Cal: 25 mg/dL (ref 5–40)

## 2023-09-09 LAB — HEMOGLOBIN A1C
Est. average glucose Bld gHb Est-mCnc: 171 mg/dL
Hgb A1c MFr Bld: 7.6 % — ABNORMAL HIGH (ref 4.8–5.6)

## 2023-09-10 ENCOUNTER — Ambulatory Visit: Payer: Self-pay | Admitting: Nurse Practitioner

## 2023-09-11 ENCOUNTER — Other Ambulatory Visit: Payer: Self-pay | Admitting: Nurse Practitioner

## 2023-09-11 DIAGNOSIS — G8929 Other chronic pain: Secondary | ICD-10-CM

## 2023-09-22 ENCOUNTER — Other Ambulatory Visit: Payer: Self-pay | Admitting: Nurse Practitioner

## 2023-09-22 DIAGNOSIS — E119 Type 2 diabetes mellitus without complications: Secondary | ICD-10-CM

## 2023-09-23 ENCOUNTER — Telehealth: Payer: Self-pay

## 2023-09-23 NOTE — Telephone Encounter (Signed)
 Explained to pt that this was a controlled medication that has to be filled during a visit and he would have to be seen in order to have this filled. Pt verbalized understanding.

## 2023-09-23 NOTE — Telephone Encounter (Signed)
 Copied from CRM [id number]. Topic: Clinical - Prescription Issue >> Sep 23, 2023  2:44 PM Stanly Early wrote: Reason for CRM: acetaminophen -codeine  (TYLENOL  #3) 300-30 MG tablet  this medication was denied. Patient would like to know why.

## 2023-09-29 ENCOUNTER — Encounter (HOSPITAL_COMMUNITY): Payer: 59

## 2023-09-29 ENCOUNTER — Ambulatory Visit: Payer: 59

## 2023-10-02 ENCOUNTER — Other Ambulatory Visit: Payer: Self-pay | Admitting: Nurse Practitioner

## 2023-10-02 DIAGNOSIS — R609 Edema, unspecified: Secondary | ICD-10-CM

## 2023-10-18 ENCOUNTER — Other Ambulatory Visit: Payer: Self-pay | Admitting: Nurse Practitioner

## 2023-10-18 DIAGNOSIS — G8929 Other chronic pain: Secondary | ICD-10-CM

## 2023-10-27 ENCOUNTER — Other Ambulatory Visit: Payer: Self-pay | Admitting: Nurse Practitioner

## 2023-10-27 DIAGNOSIS — F339 Major depressive disorder, recurrent, unspecified: Secondary | ICD-10-CM

## 2023-11-11 ENCOUNTER — Ambulatory Visit (INDEPENDENT_AMBULATORY_CARE_PROVIDER_SITE_OTHER): Admitting: *Deleted

## 2023-11-11 DIAGNOSIS — E119 Type 2 diabetes mellitus without complications: Secondary | ICD-10-CM | POA: Diagnosis not present

## 2023-11-11 LAB — HM DIABETES EYE EXAM

## 2023-11-11 NOTE — Progress Notes (Signed)
 Arrived on 11/11/2023 and has given verbal consent to obtain images and complete their overdue diabetic retinal screening.  The images have been sent to an ophthalmologist or optometrist for review and interpretation.  Results will be sent back to [facility] for review.  Patient has been informed they will be contacted when we receive the results via telephone or MyChart.

## 2023-11-30 ENCOUNTER — Ambulatory Visit: Admitting: Family

## 2023-11-30 VITALS — BP 104/70 | HR 81 | Temp 98.3°F | Ht 69.0 in | Wt >= 6400 oz

## 2023-11-30 DIAGNOSIS — R52 Pain, unspecified: Secondary | ICD-10-CM

## 2023-11-30 DIAGNOSIS — R6889 Other general symptoms and signs: Secondary | ICD-10-CM

## 2023-11-30 DIAGNOSIS — E119 Type 2 diabetes mellitus without complications: Secondary | ICD-10-CM

## 2023-11-30 DIAGNOSIS — J069 Acute upper respiratory infection, unspecified: Secondary | ICD-10-CM

## 2023-11-30 LAB — VERITOR FLU A/B WAIVED
Influenza A: NEGATIVE
Influenza B: NEGATIVE

## 2023-11-30 MED ORDER — BENZONATATE 200 MG PO CAPS: 200.0000 mg | ORAL_CAPSULE | Freq: Three times a day (TID) | ORAL | 1 refills | Status: AC | PRN

## 2023-11-30 MED ORDER — PREDNISONE 20 MG PO TABS: 40.0000 mg | ORAL_TABLET | Freq: Every day | ORAL | 0 refills | Status: AC

## 2023-11-30 NOTE — Progress Notes (Signed)
 Subjective:    Patient ID: Justin Schmidt, male    DOB: [birth year], 43 y.o.   MRN: [id number]  Chief Complaint  Patient presents with   cough, congestion, rhinorrhea   Pt presents to the office today with cough, congestion, and body aches.   He is a diabetic, last A1C 7.6.  Cough This is a new problem. The current episode started in the past 7 days. The problem has been gradually worsening. The problem occurs every few minutes. The cough is Non-productive. Associated symptoms include chills, ear congestion, ear pain (right), headaches, myalgias, nasal congestion, rhinorrhea and a sore throat. Pertinent negatives include no fever, postnasal drip, shortness of breath or wheezing. He has tried rest and OTC cough suppressant for the symptoms. The treatment provided mild relief.      Review of Systems  Constitutional:  Positive for chills. Negative for fever.  HENT:  Positive for ear pain (right), rhinorrhea and sore throat. Negative for postnasal drip.   Respiratory:  Positive for cough. Negative for shortness of breath and wheezing.   Musculoskeletal:  Positive for myalgias.  Neurological:  Positive for headaches.  All other systems reviewed and are negative.   Social History   Socioeconomic History   Marital status: Single    Spouse name: Not on file   Number of children: Not on file   Years of education: 12th grade   Highest education level: High school graduate  Occupational History   Occupation: disabled  Tobacco Use   Smoking status: Never   Smokeless tobacco: Never  Vaping Use   Vaping status: Never Used  Substance and Sexual Activity   Alcohol use: Yes    Alcohol/week: 1.0 standard drink of alcohol    Types: 1 Cans of beer per week    Comment: very rare   Drug use: No   Sexual activity: Never  Other Topics Concern   Not on file  Social History Narrative   Lives alone - no children   Social Drivers of Corporate investment banker Strain: Low Risk  (12/18/2022)    Overall Financial Resource Strain (CARDIA)    Difficulty of Paying Living Expenses: Not hard at all  Food Insecurity: No Food Insecurity (01/08/2023)   Hunger Vital Sign    Worried About Running Out of Food in the Last Year: Never true    Ran Out of Food in the Last Year: Never true  Transportation Needs: No Transportation Needs (01/08/2023)   PRAPARE - Administrator, Civil Service (Medical): No    Lack of Transportation (Non-Medical): No  Physical Activity: Insufficiently Active (12/18/2022)   Exercise Vital Sign    Days of Exercise per Week: 3 days    Minutes of Exercise per Session: 30 min  Stress: No Stress Concern Present (12/18/2022)   [employer] of Occupational Health - Occupational Stress Questionnaire    Feeling of Stress : Not at all  Social Connections: Socially Isolated (12/18/2022)   Social Connection and Isolation Panel    Frequency of Communication with Friends and Family: More than three times a week    Frequency of Social Gatherings with Friends and Family: More than three times a week    Attends Religious Services: Never    Database administrator or Organizations: No    Attends Banker Meetings: Never    Marital Status: Never married   Family History  Problem Relation Age of Onset   Diabetes Mother    Diabetes  Father    Hypertension Father    Hyperlipidemia Father    Migraines Sister    Diabetes Maternal Uncle    Diabetes Maternal Grandmother    Diabetes Other         Objective:   Physical Exam Vitals reviewed.  Constitutional:      General: He is not in acute distress.    Appearance: He is well-developed. He is obese.  HENT:     Head: Normocephalic.     Right Ear: A middle ear effusion is present.     Left Ear: A middle ear effusion is present.  Eyes:     General:        Right eye: No discharge.        Left eye: No discharge.     Pupils: Pupils are equal, round, and reactive to light.  Neck:     Thyroid : No  thyromegaly.  Cardiovascular:     Rate and Rhythm: Normal rate and regular rhythm.     Heart sounds: Normal heart sounds. No murmur heard. Pulmonary:     Effort: Pulmonary effort is normal. No respiratory distress.     Breath sounds: Normal breath sounds. No wheezing.  Abdominal:     General: Bowel sounds are normal. There is no distension.     Palpations: Abdomen is soft.     Tenderness: There is no abdominal tenderness.  Musculoskeletal:        General: No tenderness. Normal range of motion.     Cervical back: Normal range of motion and neck supple.  Skin:    General: Skin is warm and dry.     Findings: No erythema or rash.  Neurological:     Mental Status: He is alert and oriented to person, place, and time.     Cranial Nerves: No cranial nerve deficit.     Deep Tendon Reflexes: Reflexes are normal and symmetric.  Psychiatric:        Behavior: Behavior normal.        Thought Content: Thought content normal.        Judgment: Judgment normal.       BP 104/70   Pulse 81   Temp 98.3 F (36.8 C)   Ht 5' 9 (1.753 m)   Wt (!) 446 lb (202.3 kg)   SpO2 95%   BMI 65.86 kg/m      Assessment & Plan:  RADWAN COWLEY comes in today with chief complaint of cough, congestion, rhinorrhea   Diagnosis and orders addressed:  1. Body aches - COVID-19, Flu A+B and RSV - Veritor Flu A/B Waived  2. Flu-like symptoms (Primary)  3. Viral URI with cough - Take meds as prescribed - Use a cool mist humidifier  -Use saline nose sprays frequently -Force fluids -For any cough or congestion  Use plain Mucinex - regular strength or max strength is fine -For fever or aces or pains- take tylenol  or ibuprofen . -Throat lozenges if help -Follow up if symptoms worsen or do not improve  - predniSONE  (DELTASONE ) 20 MG tablet; Take 2 tablets (40 mg total) by mouth daily with breakfast for 5 days.  Dispense: 10 tablet; Refill: 0 - benzonatate  (TESSALON ) 200 MG capsule; Take 1 capsule (200 mg  total) by mouth 3 (three) times daily as needed.  Dispense: 30 capsule; Refill: 1  4. Type 2 diabetes mellitus without complication, without long-term current use of insulin  (HCC) Strict low carb diet while taking prednisone   Flu and COVID pending  Start  prednisone , strict low carb diet  Tessalon  as needed Follow up if symptoms worsen or do not improve    Bari Learn, FNP

## 2023-11-30 NOTE — Patient Instructions (Signed)
 Viral Illness, Adult Viruses are tiny germs that can get into a person's body and cause illness. There are many different types of viruses. And they cause many types of illness. Viral illnesses can range from mild to severe. They can affect various parts of the body. Short-term conditions that are caused by a virus include colds and flu (influenza) and stomach viruses. Long-term conditions that are caused by a virus include herpes, shingles, and human immunodeficiency virus (HIV) infection. A few viruses have been linked to certain cancers. What are the causes? Many types of viruses can cause illness. Viruses get into cells in your body, multiply, and cause the infected cells to work differently or die. When these cells die, they release more of the virus. When this happens, you get symptoms of the illness and the virus spreads to other cells. If the virus takes over how the cell works, it can cause the cell to divide and grow out of control. This happens when a virus causes cancer. Different viruses get into the body in different ways. You can get a virus by: Swallowing food or water that has come in contact with the virus. Breathing in droplets that have been coughed or sneezed into the air by an infected person. Touching a surface that has the virus on it and then touching your eyes, nose, or mouth. Being bitten by an insect or animal that carries the virus. Having sexual contact with a person who is infected with the virus. Being exposed to blood or fluids that contain the virus, either through an open cut or during a transfusion. If a virus enters your body, your body's disease-fighting system (immune system) will try to fight the virus. You may be at higher risk for a viral illness if your immune system is weak. What are the signs or symptoms? Symptoms depend on the type of virus and the location of the cells that it gets into. Symptoms can include: For cold and flu  viruses: Fever. Headache. Sore throat. Muscle aches. Stuffy nose (nasal congestion). Cough. For stomach (gastrointestinal) viruses: Fever. Pain in the abdomen. Nausea or vomiting. Diarrhea. For liver viruses (hepatitis): Loss of appetite. Feeling tired. Skin or the white parts of your eyes turning yellow (jaundice). For brain and spinal cord viruses: Fever. Headache. Stiff neck. Nausea and vomiting. Confusion or being sleepy. For skin viruses: Warts. Itching. Rash. For sexually transmitted viruses: Discharge. Swelling. Redness. Rash. How is this diagnosed? This condition may be diagnosed based on one or more of these: Your symptoms and medical history. A physical exam. Tests, such as: Blood tests. Tests on a sample of mucus from your lungs (sputum sample). Tests on a poop (stool) sample. Tests on a swab of body fluids or a skin sore (lesion). How is this treated? Viruses can be hard to treat because they live within cells. Antibiotics do not treat viruses because these medicines do not get inside cells. Treatment for a viral illness may include: Resting and drinking a lot of fluids. Medicines to treat symptoms. These can include over-the-counter medicine for pain and fever, medicines for cough or congestion, and medicines for diarrhea. Antiviral medicines. These medicines are available only for certain types of viruses. Some viral illnesses can be prevented with vaccinations. A common example is the flu shot. Follow these instructions at home: Medicines Take over-the-counter and prescription medicines only as told by your health care provider. If you were prescribed an antiviral medicine, take it as told by your provider. Do not stop  taking the antiviral even if you start to feel better. Know when antibiotics are needed and when they are not needed. Antibiotics do not treat viruses. You may get an antibiotic if your provider thinks that you may have, or are at risk  for, a bacterial infection and you have a viral infection. Do not ask for an antibiotic prescription if you have been diagnosed with a viral illness. Antibiotics will not make your illness go away faster. Taking antibiotics when they are not needed can lead to antibiotic resistance. When this develops, the medicine no longer works against the bacteria that it normally fights. General instructions Drink enough fluids to keep your pee (urine) pale yellow. Rest as much as possible. Return to your normal activities as told by your provider. Ask your provider what activities are safe for you. How is this prevented? To lower your risk of getting another viral illness: Wash your hands often with soap and water for at least 20 seconds. If soap and water are not available, use hand sanitizer. Avoid touching your nose, eyes, and mouth, especially if you have not washed your hands recently. If anyone in your household has a viral infection, clean all household surfaces that may have been in contact with the virus. Use soap and hot water. You may also use a commercially prepared, bleach-containing solution. Stay away from people who are sick with symptoms of a viral infection. Do not share items such as toothbrushes and water bottles with other people. Keep your vaccinations up to date. This includes getting a yearly flu shot. Eat a healthy diet and get plenty of rest. Contact a health care provider if: You have symptoms of a viral illness that do not go away. Your symptoms come back after going away. Your symptoms get worse. Get help right away if: You have trouble breathing. You have a severe headache or a stiff neck. You have severe vomiting or pain in your abdomen. These symptoms may be an emergency. Get help right away. Call 911. Do not wait to see if the symptoms will go away. Do not drive yourself to the hospital. This information is not intended to replace advice given to you by your health  care provider. Make sure you discuss any questions you have with your health care provider. Document Revised: 04/30/2022 Document Reviewed: 02/12/2022 Elsevier Patient Education  2024 [employer].

## 2023-12-01 ENCOUNTER — Ambulatory Visit: Payer: Self-pay | Admitting: Family

## 2023-12-02 LAB — COVID-19, FLU A+B AND RSV
Influenza A, NAA: NOT DETECTED
Influenza B, NAA: NOT DETECTED
RSV, NAA: NOT DETECTED
SARS-CoV-2, NAA: DETECTED — AB

## 2023-12-07 ENCOUNTER — Ambulatory Visit: Admitting: Nurse Practitioner

## 2023-12-07 NOTE — Progress Notes (Deleted)
 Subjective:    Patient ID: Justin Schmidt, male    DOB: 03/25/1981, 43 y.o.   MRN: [id number]   Chief Complaint: medical management of chronic issues     HPI:  Justin Schmidt is a 43 y.o. who identifies as a male who was assigned male at birth.   Social history: Lives with: a friend Work history: disability   Comes in today for follow up of the following chronic medical issues:  1. Essential hypertension No c/o chest pain, sob or headache. Does not check blood pressure at home BP Readings from Last 3 Encounters:  11/30/23 104/70  09/08/23 (!) 142/88  06/19/23 (!) 149/84     2. Hyperlipidemia associated with type 2 diabetes mellitus (HCC) Does not watch diet. Says he works out in home gym almost daily Lab Results  Component Value Date   CHOL 167 09/08/2023   HDL 44 09/08/2023   LDLCALC 98 09/08/2023   TRIG 140 09/08/2023   CHOLHDL 3.8 09/08/2023   The 10-year ASCVD risk score (Arnett DK, et al., 2019) is: 2.1%   3. Type 2 diabetes mellitus without complication, without long-term current use of insulin  (HCC) Fasting blood sugars are running around 140-170. He has really been trying to watch diet Lab Results  Component Value Date   HGBA1C 7.6 (H) 09/08/2023     4. Depression, recurrent (HCC) Is on lexapro  and is doing well    11/30/2023   10:12 AM 09/08/2023    3:28 PM 06/19/2023    2:57 PM  Depression screen PHQ 2/9  Decreased Interest 1 0 1  Down, Depressed, Hopeless 1 0 1  PHQ - 2 Score 2 0 2  Altered sleeping 1  0  Tired, decreased energy 1  0  Change in appetite 1  0  Feeling bad or failure about yourself  1  1  Trouble concentrating 1  0  Moving slowly or fidgety/restless 1  0  Suicidal thoughts 0  0  PHQ-9 Score 8  3  Difficult doing work/chores Somewhat difficult  Somewhat difficult       5. Morbid obesity with BMI of 60.0-69.9, adult (HCC) Weight is down 2lbs   ***         New complaints: None today  Allergies  Allergen  Reactions   Metformin  And Related     diarrhea   Xarelto  [Rivaroxaban ]     DROWSINESS, FATIGUE   Outpatient Encounter Medications as of 12/07/2023  Medication Sig   acetaminophen -codeine  (TYLENOL  #3) 300-30 MG tablet Take 1 tablet by mouth in the morning and at bedtime.   Ascorbic Acid (VITAMIN C) 1000 MG tablet Take 1,000 mg by mouth daily.   aspirin EC 81 MG tablet Take 81 mg by mouth every other day.   benzonatate  (TESSALON ) 200 MG capsule Take 1 capsule (200 mg total) by mouth 3 (three) times daily as needed.   Cholecalciferol (VITAMIN D) 2000 UNITS tablet Take 2,000 Units by mouth daily.   diclofenac  (VOLTAREN ) 75 MG EC tablet TAKE 1 TABLET BY MOUTH TWICE  DAILY FOR FOOT PAIN   escitalopram  (LEXAPRO ) 10 MG tablet TAKE 1 TABLET BY MOUTH DAILY   Flaxseed, Linseed, (FLAXSEED OIL PO) Take 1 capsule by mouth daily.   fluticasone  (FLONASE ) 50 MCG/ACT nasal spray SPRAY 2 SPRAYS INTO EACH NOSTRIL EVERY DAY   furosemide  (LASIX ) 20 MG tablet TAKE 1 TABLET BY MOUTH DAILY   GARLIC PO Take by mouth.   lisinopril  (ZESTRIL ) 10 MG tablet TAKE  1 TABLET BY MOUTH DAILY   Misc Natural Products (BEET ROOT) 500 MG CAPS Take 1 capsule by mouth daily.   Multiple Vitamin (MULTIVITAMIN WITH MINERALS) TABS Take 1 tablet by mouth daily.   [EXPIRED] predniSONE  (DELTASONE ) 20 MG tablet Take 2 tablets (40 mg total) by mouth daily with breakfast for 5 days.   rosuvastatin  (CRESTOR ) 5 MG tablet TAKE 1 TABLET BY MOUTH DAILY   sitaGLIPtin  (JANUVIA ) 50 MG tablet TAKE 1 TABLET BY MOUTH DAILY   Zinc Acetate, Oral, (ZINC ACETATE PO) Take 1 tablet by mouth daily.   Facility-Administered Encounter Medications as of 12/07/2023  Medication   sodium chloride  irrigation 0.9 %    Past Surgical History:  Procedure Laterality Date   APPENDECTOMY  1991   CHOLECYSTECTOMY  01/23/2012   Procedure: LAPAROSCOPIC CHOLECYSTECTOMY;  Surgeon: Thresa JAYSON Pulling, MD;  Location: [facility];  Service: General;  Laterality: N/A;   TONSILLECTOMY       Family History  Problem Relation Age of Onset   Diabetes Mother    Diabetes Father    Hypertension Father    Hyperlipidemia Father    Migraines Sister    Diabetes Maternal Uncle    Diabetes Maternal Grandmother    Diabetes Other       Controlled substance contract: n/a     Review of Systems  Constitutional:  Negative for diaphoresis.  Eyes:  Negative for pain.  Respiratory:  Negative for shortness of breath.   Cardiovascular:  Negative for chest pain, palpitations and leg swelling.  Gastrointestinal:  Negative for abdominal pain.  Endocrine: Negative for polydipsia.  Skin:  Negative for rash.  Neurological:  Negative for dizziness, weakness and headaches.  Hematological:  Does not bruise/bleed easily.  All other systems reviewed and are negative.      Objective:   Physical Exam Vitals and nursing note reviewed.  Constitutional:      Appearance: Normal appearance. He is well-developed.  HENT:     Head: Normocephalic.     Nose: Nose normal.     Mouth/Throat:     Mouth: Mucous membranes are moist.     Pharynx: Oropharynx is clear.  Eyes:     Pupils: Pupils are equal, round, and reactive to light.  Neck:     Thyroid : No thyroid  mass or thyromegaly.     Vascular: No carotid bruit or JVD.     Trachea: Phonation normal.  Cardiovascular:     Rate and Rhythm: Normal rate and regular rhythm.  Pulmonary:     Effort: Pulmonary effort is normal. No respiratory distress.     Breath sounds: Normal breath sounds.  Abdominal:     General: Bowel sounds are normal.     Palpations: Abdomen is soft.     Tenderness: There is no abdominal tenderness.     Hernia: A hernia (nontender umbilical hernia) is present.  Musculoskeletal:        General: Normal range of motion.     Cervical back: Normal range of motion and neck supple.     Right lower leg: Edema (2+) present.     Left lower leg: Edema (2+) present.  Lymphadenopathy:     Cervical: No cervical adenopathy.   Skin:    General: Skin is warm and dry.     Comments: Circulatory skin changes bil lower ext  Neurological:     Mental Status: He is alert and oriented to person, place, and time.  Psychiatric:        Behavior: Behavior  normal.        Thought Content: Thought content normal.        Judgment: Judgment normal.     There were no vitals taken for this visit.     Hgba1c  7.7%     Assessment & Plan:   Justin Schmidt comes in today with chief complaint of No chief complaint on file.   Diagnosis and orders addressed:  1. Essential hypertension Low fat diet - CBC with Differential/Platelet - CMP14+EGFR  2. Hyperlipidemia associated with type 2 diabetes mellitus (HCC) Low fat diet - Lipid panel  3. Type 2 diabetes mellitus without complication, without long-term current use of insulin  (HCC) Strict carb counting Back on diet Start back exercising - Bayer DCA Hb A1c Waived - rosuvastatin  (CRESTOR ) 5 MG tablet; Take 1 tablet (5 mg total) by mouth daily.  Dispense: 90 tablet; Refill: 1 - lisinopril  (ZESTRIL ) 10 MG tablet; Take 1 tablet (10 mg total) by mouth daily.  Dispense: 90 tablet; Refill: 1 - sitaGLIPtin  (JANUVIA ) 50 MG tablet; Take 1 tablet (50 mg total) by mouth daily.  Dispense: 90 tablet; Refill: 1  4. Depression, recurrent (HCC) Stress management - escitalopram  (LEXAPRO ) 10 MG tablet; Take 1 tablet (10 mg total) by mouth daily.  Dispense: 90 tablet; Refill: 1  5. Morbid obesity with BMI of 60.0-69.9, adult (HCC) Discussed diet and exercise for person with BMI >25 Will recheck weight in 3-6 months  6. Swelling - furosemide  (LASIX ) 20 MG tablet; Take 1 tablet (20 mg total) by mouth daily.  Dispense: 90 tablet; Refill: 1   Labs pending Health Maintenance reviewed Diet and exercise encouraged  Follow up plan: 3 months   [name] Gladis, FNP

## 2023-12-11 ENCOUNTER — Ambulatory Visit: Admitting: Nurse Practitioner

## 2023-12-11 ENCOUNTER — Other Ambulatory Visit: Payer: Self-pay | Admitting: Nurse Practitioner

## 2023-12-11 DIAGNOSIS — R609 Edema, unspecified: Secondary | ICD-10-CM

## 2023-12-22 ENCOUNTER — Ambulatory Visit (INDEPENDENT_AMBULATORY_CARE_PROVIDER_SITE_OTHER)

## 2023-12-22 VITALS — BP 104/70 | HR 81 | Ht 69.0 in | Wt >= 6400 oz

## 2023-12-22 DIAGNOSIS — Z0000 Encounter for general adult medical examination without abnormal findings: Secondary | ICD-10-CM

## 2023-12-22 NOTE — Progress Notes (Signed)
 Subjective:   Justin Schmidt is a 43 y.o. who presents for a Medicare Wellness preventive visit.  As a reminder, Annual Wellness Visits don't include a physical exam, and some assessments may be limited, especially if this visit is performed virtually. We may recommend an in-person follow-up visit with your provider if needed.  Visit Complete: Virtual I connected with  Lynwood JAYSON Hetland on 12/22/23 by a audio enabled telemedicine application and verified that I am speaking with the correct person using two identifiers.  Patient Location: Home  Provider Location: Home Office  I discussed the limitations of evaluation and management by telemedicine. The patient expressed understanding and agreed to proceed.  Vital Signs: Because this visit was a virtual/telehealth visit, some criteria may be missing or patient reported. Any vitals not documented were not able to be obtained and vitals that have been documented are patient reported.  VideoDeclined- This patient declined Librarian, academic. Therefore the visit was completed with audio only.  Persons Participating in Visit: Patient.  AWV Questionnaire: No: Patient Medicare AWV questionnaire was not completed prior to this visit.  Cardiac Risk Factors include: advanced age (>76men, >58 women);obesity (BMI >30kg/m2);diabetes mellitus;dyslipidemia;male gender     Objective:    Today's Vitals   12/22/23 1255  BP: 104/70  Pulse: 81  Weight: (!) 446 lb (202.3 kg)  Height: 5' 9 (1.753 m)   Body mass index is 65.86 kg/m.     12/22/2023   12:59 PM 01/08/2023   11:27 PM 01/08/2023    2:14 PM 12/18/2022    1:41 PM 12/16/2021    3:16 PM 12/13/2020    4:36 PM 08/12/2017    3:14 PM  Advanced Directives  Does Patient Have a Medical Advance Directive? No No No No No No No   Would patient like information on creating a medical advance directive?  Yes (MAU/Ambulatory/Procedural Areas - Information given)  Yes  (MAU/Ambulatory/Procedural Areas - Information given) No - Patient declined No - Patient declined No - Patient declined      Data saved with a previous flowsheet row definition    Current Medications (verified) Outpatient Encounter Medications as of 12/22/2023  Medication Sig   acetaminophen -codeine  (TYLENOL  #3) 300-30 MG tablet Take 1 tablet by mouth in the morning and at bedtime.   Ascorbic Acid (VITAMIN C) 1000 MG tablet Take 1,000 mg by mouth daily.   aspirin EC 81 MG tablet Take 81 mg by mouth every other day.   benzonatate  (TESSALON ) 200 MG capsule Take 1 capsule (200 mg total) by mouth 3 (three) times daily as needed.   Cholecalciferol (VITAMIN D) 2000 UNITS tablet Take 2,000 Units by mouth daily.   diclofenac  (VOLTAREN ) 75 MG EC tablet TAKE 1 TABLET BY MOUTH TWICE  DAILY FOR FOOT PAIN   escitalopram  (LEXAPRO ) 10 MG tablet TAKE 1 TABLET BY MOUTH DAILY   Flaxseed, Linseed, (FLAXSEED OIL PO) Take 1 capsule by mouth daily.   fluticasone  (FLONASE ) 50 MCG/ACT nasal spray SPRAY 2 SPRAYS INTO EACH NOSTRIL EVERY DAY   furosemide  (LASIX ) 20 MG tablet TAKE 1 TABLET BY MOUTH DAILY   GARLIC PO Take by mouth.   lisinopril  (ZESTRIL ) 10 MG tablet TAKE 1 TABLET BY MOUTH DAILY   Misc Natural Products (BEET ROOT) 500 MG CAPS Take 1 capsule by mouth daily.   Multiple Vitamin (MULTIVITAMIN WITH MINERALS) TABS Take 1 tablet by mouth daily.   rosuvastatin  (CRESTOR ) 5 MG tablet TAKE 1 TABLET BY MOUTH DAILY   sitaGLIPtin  (  JANUVIA ) 50 MG tablet TAKE 1 TABLET BY MOUTH DAILY   Zinc Acetate, Oral, (ZINC ACETATE PO) Take 1 tablet by mouth daily.   Facility-Administered Encounter Medications as of 12/22/2023  Medication   sodium chloride  irrigation 0.9 %    Allergies (verified) Metformin  and related and Xarelto  [rivaroxaban ]   History: Past Medical History:  Diagnosis Date   Acute saddle pulmonary embolism without acute cor pulmonale (HCC) 01/30/2017   Back pain    Diabetic nephropathy (HCC) 08/22/2021    Gallstones    Hyperlipidemia    Type 2 diabetes mellitus without complication, without long-term current use of insulin  (HCC) 08/11/2018   VTE (venous thromboembolism) 05/14/2017   Past Surgical History:  Procedure Laterality Date   APPENDECTOMY  1991   CHOLECYSTECTOMY  01/23/2012   Procedure: LAPAROSCOPIC CHOLECYSTECTOMY;  Surgeon: Thresa JAYSON Pulling, MD;  Location: [facility];  Service: General;  Laterality: N/A;   TONSILLECTOMY     Family History  Problem Relation Age of Onset   Diabetes Mother    Diabetes Father    Hypertension Father    Hyperlipidemia Father    Migraines Sister    Diabetes Maternal Uncle    Diabetes Maternal Grandmother    Diabetes Other    Social History   Socioeconomic History   Marital status: Single    Spouse name: Not on file   Number of children: Not on file   Years of education: 12th grade   Highest education level: High school graduate  Occupational History   Occupation: disabled  Tobacco Use   Smoking status: Never   Smokeless tobacco: Never  Vaping Use   Vaping status: Never Used  Substance and Sexual Activity   Alcohol use: Yes    Alcohol/week: 1.0 standard drink of alcohol    Types: 1 Cans of beer per week    Comment: very rare   Drug use: No   Sexual activity: Never  Other Topics Concern   Not on file  Social History Narrative   Lives alone - no children   Social Drivers of Corporate investment banker Strain: Low Risk  (12/22/2023)   Overall Financial Resource Strain (CARDIA)    Difficulty of Paying Living Expenses: Not hard at all  Food Insecurity: No Food Insecurity (12/22/2023)   Hunger Vital Sign    Worried About Running Out of Food in the Last Year: Never true    Ran Out of Food in the Last Year: Never true  Transportation Needs: No Transportation Needs (01/08/2023)   PRAPARE - Administrator, Civil Service (Medical): No    Lack of Transportation (Non-Medical): No  Physical Activity: Sufficiently Active  (12/22/2023)   Exercise Vital Sign    Days of Exercise per Week: 3 days    Minutes of Exercise per Session: 60 min  Stress: No Stress Concern Present (12/22/2023)   [employer] of Occupational Health - Occupational Stress Questionnaire    Feeling of Stress: Not at all  Social Connections: Socially Isolated (12/22/2023)   Social Connection and Isolation Panel    Frequency of Communication with Friends and Family: Three times a week    Frequency of Social Gatherings with Friends and Family: Three times a week    Attends Religious Services: Never    Active Member of Clubs or Organizations: No    Attends Banker Meetings: Never    Marital Status: Never married    Tobacco Counseling Counseling given: Yes    Clinical Intake:  Pre-visit preparation completed: Yes  Pain : No/denies pain     BMI - recorded: 65.86 Nutritional Status: BMI > 30  Obese Nutritional Risks: None Diabetes: Yes  Lab Results  Component Value Date   HGBA1C 7.6 (H) 09/08/2023   HGBA1C 7.7 (H) 06/19/2023   HGBA1C 7.0 (H) 03/20/2023     How often do you need to have someone help you when you read instructions, pamphlets, or other written materials from your doctor or pharmacy?: 1 - Never  Interpreter Needed?: No  Information entered by :: alia t/cma   Activities of Daily Living     12/22/2023   12:58 PM 01/08/2023   11:27 PM  In your present state of health, do you have any difficulty performing the following activities:  Hearing? 0 0  Vision? 0 0  Difficulty concentrating or making decisions? 0 0  Walking or climbing stairs? 0 1  Dressing or bathing? 0 1  Doing errands, shopping? 0 0  Preparing Food and eating ? N   Using the Toilet? N   In the past six months, have you accidently leaked urine? N   Do you have problems with loss of bowel control? N   Managing your Medications? N   Managing your Finances? N   Housekeeping or managing your Housekeeping? N     Patient Care  Team: Gladis Mustard, FNP as PCP - General (Family Medicine) Frances, Ozell RAMAN, LCSW as Social Worker (Licensed Clinical Social Worker) Ladora, Ross Lacy Cohn, MD as Referring Physician (Optometry)  I have updated your Care Teams any recent Medical Services you may have received from other providers in the past year.     Assessment:   This is a routine wellness examination for [place].  Hearing/Vision screen Hearing Screening - Comments:: Pt denies hearing dif Vision Screening - Comments:: Pt denies vision dif/last ov been over a yr/   Goals Addressed             This Visit's Progress    Have 3 meals a day   On track    Eat 3 meals daily that consist of lean proteins, fruits and vegetables Increase water intake        Depression Screen     12/22/2023    1:01 PM 11/30/2023   10:12 AM 09/08/2023    3:28 PM 06/19/2023    2:57 PM 03/20/2023    2:30 PM 01/06/2023    3:07 PM 12/22/2022    3:00 PM  PHQ 2/9 Scores  PHQ - 2 Score 4 2 0 2 1 0 0  PHQ- 9 Score 9 8  3 3  0 2    Fall Risk     12/22/2023   12:54 PM 11/30/2023   10:11 AM 09/08/2023    3:28 PM 06/19/2023    2:57 PM 03/20/2023    2:30 PM  Fall Risk   Falls in the past year? 0 0 0 0 0  Number falls in past yr: 0 0     Injury with Fall? 0 0     Risk for fall due to : No Fall Risks No Fall Risks     Follow up Falls evaluation completed Falls evaluation completed       MEDICARE RISK AT HOME:  Medicare Risk at Home Any stairs in or around the home?: Yes If so, are there any without handrails?: Yes Home free of loose throw rugs in walkways, pet beds, electrical cords, etc?: Yes Adequate lighting in your home  to reduce risk of falls?: Yes Life alert?: No Use of a cane, walker or w/c?: No Grab bars in the bathroom?: Yes Shower chair or bench in shower?: Yes Elevated toilet seat or a handicapped toilet?: Yes  TIMED UP AND GO:  Was the test performed?  no  Cognitive Function: 6CIT completed    12/01/2017    2:21  PM 11/19/2015    2:40 PM 09/29/2014    4:09 PM  MMSE - Mini Mental State Exam  Orientation to time 5 5  5    Orientation to Place 5 5  5    Registration 3 3  3    Attention/ Calculation 5 5  5    Recall 3 3  3    Language- name 2 objects 2 2  2    Language- repeat 1 1 1   Language- follow 3 step command 3 3  3    Language- read & follow direction 1 1  1    Write a sentence 1 1  1    Copy design 1 1  1    Total score 30 30  30       Data saved with a previous flowsheet row definition        12/22/2023    1:03 PM 12/18/2022    1:42 PM 12/16/2021    3:32 PM  6CIT Screen  What Year? 0 points 0 points 0 points  What month? 0 points 0 points 0 points  What time? 0 points 0 points 0 points  Count back from 20 0 points 0 points 0 points  Months in reverse 0 points 0 points 0 points  Repeat phrase 0 points 0 points 0 points  Total Score 0 points 0 points 0 points    Immunizations Immunization History  Administered Date(s) Administered   Influenza,inj,Quad PF,6+ Mos 02/01/2015, 02/07/2016, 03/19/2018, 04/20/2019   Td 04/08/2005   Tdap 12/01/2017    Screening Tests Health Maintenance  Topic Date Due   COVID-19 Vaccine (1) Never done   Hepatitis B Vaccines 19-59 Average Risk (1 of 3 - 19+ 3-dose series) Never done   HPV VACCINES (1 - 3-dose SCDM series) Never done   INFLUENZA VACCINE  11/27/2023   Pneumococcal Vaccine (1 of 2 - PCV) 06/18/2024 (Originally 02/12/1999)   HEMOGLOBIN A1C  03/10/2024   Diabetic kidney evaluation - Urine ACR  08/11/2024   Diabetic kidney evaluation - eGFR measurement  09/07/2024   FOOT EXAM  09/07/2024   OPHTHALMOLOGY EXAM  11/10/2024   Medicare Annual Wellness (AWV)  12/21/2024   DTaP/Tdap/Td (3 - Td or Tdap) 12/02/2027   Hepatitis C Screening  Completed   HIV Screening  Completed   Meningococcal B Vaccine  Aged Out    Health Maintenance  Health Maintenance Due  Topic Date Due   COVID-19 Vaccine (1) Never done   Hepatitis B Vaccines 19-59 Average  Risk (1 of 3 - 19+ 3-dose series) Never done   HPV VACCINES (1 - 3-dose SCDM series) Never done   INFLUENZA VACCINE  11/27/2023   Health Maintenance Items Addressed: See Nurse Notes at the end of this note  Additional Screening:  Vision Screening: Recommended annual ophthalmology exams for early detection of glaucoma and other disorders of the eye. Would you like a referral to an eye doctor? No    Dental Screening: Recommended annual dental exams for proper oral hygiene  Community Resource Referral / Chronic Care Management: CRR required this visit?  No   CCM required this visit?  No   Plan:  I have personally reviewed and noted the following in the patient's chart:   Medical and social history Use of alcohol, tobacco or illicit drugs  Current medications and supplements including opioid prescriptions. Patient is not currently taking opioid prescriptions. Functional ability and status Nutritional status Physical activity Advanced directives List of other physicians Hospitalizations, surgeries, and ER visits in previous 12 months Vitals Screenings to include cognitive, depression, and falls Referrals and appointments  In addition, I have reviewed and discussed with patient certain preventive protocols, quality metrics, and best practice recommendations. A written personalized care plan for preventive services as well as general preventive health recommendations were provided to patient.   Ozie Ned, CMA   12/22/2023   After Visit Summary: (Declined) Due to this being a telephonic visit, with patients personalized plan was offered to patient but patient Declined AVS at this time   Notes: Nothing significant to report at this time.

## 2023-12-22 NOTE — Patient Instructions (Addendum)
 Justin Schmidt , Thank you for taking time out of your busy schedule to complete your Annual Wellness Visit with me. I enjoyed our conversation and look forward to speaking with you again next year. I, as well as your care team,  appreciate your ongoing commitment to your health goals. Please review the following plan we discussed and let me know if I can assist you in the future. Your Game plan/ To Do List    Referrals: If you haven't heard from the office you've been referred to, please reach out to them at the phone provided.   Follow up Visits: We will see or speak with you next year for your Next Medicare AWV with our clinical staff on 12/22/24 at 11:20a.m. Have you seen your provider in the last 6 months (3 months if uncontrolled diabetes)? Yes  Clinician Recommendations:  Aim for 30 minutes of exercise or brisk walking, 6-8 glasses of water, and 5 servings of fruits and vegetables each day.       This is a list of the screenings recommended for you:  Health Maintenance  Topic Date Due   COVID-19 Vaccine (1) Never done   Hepatitis B Vaccine (1 of 3 - 19+ 3-dose series) Never done   HPV Vaccine (1 - 3-dose SCDM series) Never done   Medicare Annual Wellness Visit  12/18/2023   Flu Shot  11/27/2023   Pneumococcal Vaccine (1 of 2 - PCV) 06/18/2024*   Hemoglobin A1C  03/10/2024   Yearly kidney health urinalysis for diabetes  08/11/2024   Yearly kidney function blood test for diabetes  09/07/2024   Complete foot exam   09/07/2024   Eye exam for diabetics  11/10/2024   DTaP/Tdap/Td vaccine (3 - Td or Tdap) 12/02/2027   Hepatitis C Screening  Completed   HIV Screening  Completed   Meningitis B Vaccine  Aged Out  *Topic was postponed. The date shown is not the original due date.    Advanced directives: (Declined) Advance directive discussed with you today. Even though you declined this today, please call our office should you change your mind, and we can give you the proper paperwork for you  to fill out. Advance Care Planning is important because it:  [x]  Makes sure you receive the medical care that is consistent with your values, goals, and preferences  [x]  It provides guidance to your family and loved ones and reduces their decisional burden about whether or not they are making the right decisions based on your wishes.  Follow the link provided in your after visit summary or read over the paperwork we have mailed to you to help you started getting your Advance Directives in place. If you need assistance in completing these, please reach out to us  so that we can help you!  See attachments for Preventive Care and Fall Prevention Tips.

## 2023-12-29 ENCOUNTER — Other Ambulatory Visit: Payer: Self-pay | Admitting: Nurse Practitioner

## 2023-12-29 DIAGNOSIS — F339 Major depressive disorder, recurrent, unspecified: Secondary | ICD-10-CM

## 2023-12-29 DIAGNOSIS — M545 Low back pain, unspecified: Secondary | ICD-10-CM

## 2023-12-29 DIAGNOSIS — G8929 Other chronic pain: Secondary | ICD-10-CM

## 2024-01-01 ENCOUNTER — Ambulatory Visit: Admitting: Nurse Practitioner

## 2024-01-01 ENCOUNTER — Encounter: Payer: Self-pay | Admitting: Nurse Practitioner

## 2024-01-01 VITALS — BP 128/79 | HR 90 | Temp 97.0°F | Ht 69.0 in | Wt >= 6400 oz

## 2024-01-01 DIAGNOSIS — F339 Major depressive disorder, recurrent, unspecified: Secondary | ICD-10-CM | POA: Diagnosis not present

## 2024-01-01 DIAGNOSIS — E781 Pure hyperglyceridemia: Secondary | ICD-10-CM

## 2024-01-01 DIAGNOSIS — G8929 Other chronic pain: Secondary | ICD-10-CM | POA: Diagnosis not present

## 2024-01-01 DIAGNOSIS — M545 Low back pain, unspecified: Secondary | ICD-10-CM

## 2024-01-01 DIAGNOSIS — I10 Essential (primary) hypertension: Secondary | ICD-10-CM | POA: Diagnosis not present

## 2024-01-01 DIAGNOSIS — R609 Edema, unspecified: Secondary | ICD-10-CM

## 2024-01-01 DIAGNOSIS — E785 Hyperlipidemia, unspecified: Secondary | ICD-10-CM

## 2024-01-01 DIAGNOSIS — Z6841 Body Mass Index (BMI) 40.0 and over, adult: Secondary | ICD-10-CM

## 2024-01-01 DIAGNOSIS — E1169 Type 2 diabetes mellitus with other specified complication: Secondary | ICD-10-CM | POA: Diagnosis not present

## 2024-01-01 DIAGNOSIS — E119 Type 2 diabetes mellitus without complications: Secondary | ICD-10-CM | POA: Diagnosis not present

## 2024-01-01 LAB — BAYER DCA HB A1C WAIVED: HB A1C (BAYER DCA - WAIVED): 7.5 % — ABNORMAL HIGH (ref 4.8–5.6)

## 2024-01-01 LAB — LIPID PANEL

## 2024-01-01 MED ORDER — FUROSEMIDE 20 MG PO TABS: 20.0000 mg | ORAL_TABLET | Freq: Every day | ORAL | 1 refills | Status: DC

## 2024-01-01 MED ORDER — SITAGLIPTIN PHOSPHATE 50 MG PO TABS: 50.0000 mg | ORAL_TABLET | Freq: Every day | ORAL | 1 refills | Status: DC

## 2024-01-01 MED ORDER — ROSUVASTATIN CALCIUM 5 MG PO TABS
5.0000 mg | ORAL_TABLET | Freq: Every day | ORAL | 1 refills | Status: DC
Start: 2024-01-01 — End: 2024-02-29

## 2024-01-01 MED ORDER — ACETAMINOPHEN-CODEINE 300-30 MG PO TABS: 1.0000 | ORAL_TABLET | Freq: Two times a day (BID) | ORAL | 5 refills | Status: DC

## 2024-01-01 MED ORDER — ESCITALOPRAM OXALATE 10 MG PO TABS: 10.0000 mg | ORAL_TABLET | Freq: Every day | ORAL | 1 refills | Status: DC

## 2024-01-01 MED ORDER — LISINOPRIL 10 MG PO TABS: 10.0000 mg | ORAL_TABLET | Freq: Every day | ORAL | 1 refills | Status: DC

## 2024-01-01 NOTE — Addendum Note (Signed)
 Addended by: Renny Gunnarson, [name] on: 01/01/2024 02:43 PM   Modules accepted: Orders

## 2024-01-01 NOTE — Progress Notes (Signed)
 Subjective:    Patient ID: Justin Schmidt, male    DOB: [birth year], 43 y.o.   MRN: [id number]   Chief Complaint: medical management of chronic issues     HPI:  Justin Schmidt is a 43 y.o. who identifies as a male who was assigned male at birth.   Social history: Lives with: a friend Work history: disability   Comes in today for follow up of the following chronic medical issues:  1. Essential hypertension No c/o chest pain, sob or headache. Does not check blood pressure at home BP Readings from Last 3 Encounters:  12/22/23 104/70  11/30/23 104/70  09/08/23 (!) 142/88     2. Hyperlipidemia associated with type 2 diabetes mellitus (HCC) Does not watch diet. Says he works out in home gym almost daily Lab Results  Component Value Date   CHOL 167 09/08/2023   HDL 44 09/08/2023   LDLCALC 98 09/08/2023   TRIG 140 09/08/2023   CHOLHDL 3.8 09/08/2023   The 10-year ASCVD risk score (Arnett DK, et al., 2019) is: 2.1%   3. Type 2 diabetes mellitus without complication, without long-term current use of insulin  (HCC) Fasting blood sugars are running around 140-170. He has really been trying to watch diet Lab Results  Component Value Date   HGBA1C 7.6 (H) 09/08/2023     4. Depression, recurrent (HCC) Is on lexapro  and is doing well    12/22/2023    1:01 PM 11/30/2023   10:12 AM 09/08/2023    3:28 PM  Depression screen PHQ 2/9  Decreased Interest 1 1 0  Down, Depressed, Hopeless 3 1 0  PHQ - 2 Score 4 2 0  Altered sleeping 3 1   Tired, decreased energy 2 1   Change in appetite 0 1   Feeling bad or failure about yourself  0 1   Trouble concentrating 0 1   Moving slowly or fidgety/restless 0 1   Suicidal thoughts 0 0   PHQ-9 Score 9 8   Difficult doing work/chores Somewhat difficult Somewhat difficult        5. Morbid obesity with BMI of 60.0-69.9, adult (HCC) Weight is unchanged   Wt Readings from Last 3 Encounters:  01/01/24 (!) 446 lb (202.3 kg)  12/22/23  (!) 446 lb (202.3 kg)  11/30/23 (!) 446 lb (202.3 kg)   BMI Readings from Last 3 Encounters:  01/01/24 65.86 kg/m  12/22/23 65.86 kg/m  11/30/23 65.86 kg/m            New complaints: None today  Allergies  Allergen Reactions   Metformin  And Related     diarrhea   Xarelto  [Rivaroxaban ]     DROWSINESS, FATIGUE   Outpatient Encounter Medications as of 01/01/2024  Medication Sig   acetaminophen -codeine  (TYLENOL  #3) 300-30 MG tablet Take 1 tablet by mouth in the morning and at bedtime.   Ascorbic Acid (VITAMIN C) 1000 MG tablet Take 1,000 mg by mouth daily.   aspirin EC 81 MG tablet Take 81 mg by mouth every other day.   benzonatate  (TESSALON ) 200 MG capsule Take 1 capsule (200 mg total) by mouth 3 (three) times daily as needed.   Cholecalciferol (VITAMIN D) 2000 UNITS tablet Take 2,000 Units by mouth daily.   diclofenac  (VOLTAREN ) 75 MG EC tablet TAKE 1 TABLET BY MOUTH TWICE  DAILY FOR FOOT PAIN   escitalopram  (LEXAPRO ) 10 MG tablet TAKE 1 TABLET BY MOUTH DAILY   Flaxseed, Linseed, (FLAXSEED OIL PO) Take 1 capsule  by mouth daily.   fluticasone  (FLONASE ) 50 MCG/ACT nasal spray SPRAY 2 SPRAYS INTO EACH NOSTRIL EVERY DAY   furosemide  (LASIX ) 20 MG tablet TAKE 1 TABLET BY MOUTH DAILY   GARLIC PO Take by mouth.   lisinopril  (ZESTRIL ) 10 MG tablet TAKE 1 TABLET BY MOUTH DAILY   Misc Natural Products (BEET ROOT) 500 MG CAPS Take 1 capsule by mouth daily.   Multiple Vitamin (MULTIVITAMIN WITH MINERALS) TABS Take 1 tablet by mouth daily.   rosuvastatin  (CRESTOR ) 5 MG tablet TAKE 1 TABLET BY MOUTH DAILY   sitaGLIPtin  (JANUVIA ) 50 MG tablet TAKE 1 TABLET BY MOUTH DAILY   Zinc Acetate, Oral, (ZINC ACETATE PO) Take 1 tablet by mouth daily.   Facility-Administered Encounter Medications as of 01/01/2024  Medication   sodium chloride  irrigation 0.9 %    Past Surgical History:  Procedure Laterality Date   APPENDECTOMY  1991   CHOLECYSTECTOMY  01/23/2012   Procedure: LAPAROSCOPIC  CHOLECYSTECTOMY;  Surgeon: Thresa JAYSON Pulling, MD;  Location: [facility];  Service: General;  Laterality: N/A;   TONSILLECTOMY      Family History  Problem Relation Age of Onset   Diabetes Mother    Diabetes Father    Hypertension Father    Hyperlipidemia Father    Migraines Sister    Diabetes Maternal Uncle    Diabetes Maternal Grandmother    Diabetes Other       Controlled substance contract: n/a     Review of Systems  Constitutional:  Negative for diaphoresis.  Eyes:  Negative for pain.  Respiratory:  Negative for shortness of breath.   Cardiovascular:  Negative for chest pain, palpitations and leg swelling.  Gastrointestinal:  Negative for abdominal pain.  Endocrine: Negative for polydipsia.  Skin:  Negative for rash.  Neurological:  Negative for dizziness, weakness and headaches.  Hematological:  Does not bruise/bleed easily.  All other systems reviewed and are negative.      Objective:   Physical Exam Vitals and nursing note reviewed.  Constitutional:      Appearance: Normal appearance. He is well-developed.  HENT:     Head: Normocephalic.     Nose: Nose normal.     Mouth/Throat:     Mouth: Mucous membranes are moist.     Pharynx: Oropharynx is clear.  Eyes:     Pupils: Pupils are equal, round, and reactive to light.  Neck:     Thyroid : No thyroid  mass or thyromegaly.     Vascular: No carotid bruit or JVD.     Trachea: Phonation normal.  Cardiovascular:     Rate and Rhythm: Normal rate and regular rhythm.  Pulmonary:     Effort: Pulmonary effort is normal. No respiratory distress.     Breath sounds: Normal breath sounds.  Abdominal:     General: Bowel sounds are normal.     Palpations: Abdomen is soft.     Tenderness: There is no abdominal tenderness.     Hernia: A hernia (nontender umbilical hernia) is present.  Musculoskeletal:        General: Normal range of motion.     Cervical back: Normal range of motion and neck supple.     Right lower leg:  Edema (2+) present.     Left lower leg: Edema (2+) present.  Lymphadenopathy:     Cervical: No cervical adenopathy.  Skin:    General: Skin is warm and dry.     Comments: Circulatory skin changes bil lower ext  Neurological:  Mental Status: He is alert and oriented to person, place, and time.  Psychiatric:        Behavior: Behavior normal.        Thought Content: Thought content normal.        Judgment: Judgment normal.     BP 128/79   Pulse 90   Temp (!) 97 F (36.1 C) (Temporal)   Ht 5' 9 (1.753 m)   Wt (!) 446 lb (202.3 kg)   SpO2 96%   BMI 65.86 kg/m    Hgba1c  7.5%     Assessment & Plan:   Justin Schmidt comes in today with chief complaint of medical management of chronic issues    Diagnosis and orders addressed:  1. Essential hypertension Low fat diet - CBC with Differential/Platelet - CMP14+EGFR  2. Hyperlipidemia associated with type 2 diabetes mellitus (HCC) Low fat diet - Lipid panel  3. Type 2 diabetes mellitus without complication, without long-term current use of insulin  (HCC) Strict carb counting Back on diet Start back exercising - Bayer DCA Hb A1c Waived - rosuvastatin  (CRESTOR ) 5 MG tablet; Take 1 tablet (5 mg total) by mouth daily.  Dispense: 90 tablet; Refill: 1 - lisinopril  (ZESTRIL ) 10 MG tablet; Take 1 tablet (10 mg total) by mouth daily.  Dispense: 90 tablet; Refill: 1 - sitaGLIPtin  (JANUVIA ) 50 MG tablet; Take 1 tablet (50 mg total) by mouth daily.  Dispense: 90 tablet; Refill: 1  4. Depression, recurrent (HCC) Stress management - escitalopram  (LEXAPRO ) 10 MG tablet; Take 1 tablet (10 mg total) by mouth daily.  Dispense: 90 tablet; Refill: 1  5. Morbid obesity with BMI of 60.0-69.9, adult (HCC) Discussed diet and exercise for person with BMI >25 Will recheck weight in 3-6 months  6. Swelling - furosemide  (LASIX ) 20 MG tablet; Take 1 tablet (20 mg total) by mouth daily.  Dispense: 90 tablet; Refill: 1   Labs pending Health  Maintenance reviewed Diet and exercise encouraged  Follow up plan: 3 months   [name] Gladis, FNP

## 2024-01-02 LAB — CBC WITH DIFFERENTIAL/PLATELET
Basophils Absolute: 0.1 x10E3/uL (ref 0.0–0.2)
Basos: 1 %
EOS (ABSOLUTE): 0.1 x10E3/uL (ref 0.0–0.4)
Eos: 2 %
Hematocrit: 43.1 % (ref 37.5–51.0)
Hemoglobin: 14.2 g/dL (ref 13.0–17.7)
Immature Grans (Abs): 0 x10E3/uL (ref 0.0–0.1)
Immature Granulocytes: 0 %
Lymphocytes Absolute: 1.3 x10E3/uL (ref 0.7–3.1)
Lymphs: 28 %
MCH: 31.3 pg (ref 26.6–33.0)
MCHC: 32.9 g/dL (ref 31.5–35.7)
MCV: 95 fL (ref 79–97)
Monocytes Absolute: 0.4 x10E3/uL (ref 0.1–0.9)
Monocytes: 9 %
Neutrophils Absolute: 2.9 x10E3/uL (ref 1.4–7.0)
Neutrophils: 60 %
Platelets: 239 x10E3/uL (ref 150–450)
RBC: 4.53 x10E6/uL (ref 4.14–5.80)
RDW: 13.1 % (ref 11.6–15.4)
WBC: 4.8 x10E3/uL (ref 3.4–10.8)

## 2024-01-02 LAB — CMP14+EGFR
ALT: 39 IU/L (ref 0–44)
AST: 22 IU/L (ref 0–40)
Albumin: 4.3 g/dL (ref 4.1–5.1)
Alkaline Phosphatase: 76 IU/L (ref 44–121)
BUN/Creatinine Ratio: 10 (ref 9–20)
BUN: 11 mg/dL (ref 6–24)
Bilirubin Total: 0.2 mg/dL (ref 0.0–1.2)
CO2: 24 mmol/L (ref 20–29)
Calcium: 9.8 mg/dL (ref 8.7–10.2)
Chloride: 103 mmol/L (ref 96–106)
Creatinine, Ser: 1.12 mg/dL (ref 0.76–1.27)
Globulin, Total: 2.7 g/dL (ref 1.5–4.5)
Glucose: 206 mg/dL — ABNORMAL HIGH (ref 70–99)
Potassium: 4.7 mmol/L (ref 3.5–5.2)
Sodium: 142 mmol/L (ref 134–144)
Total Protein: 7 g/dL (ref 6.0–8.5)
eGFR: 84 mL/min/1.73 (ref >59)

## 2024-01-02 LAB — LIPID PANEL
Chol/HDL Ratio: 4.1 ratio (ref 0.0–5.0)
Cholesterol, Total: 169 mg/dL (ref 100–199)
HDL: 41 mg/dL (ref >39)
LDL Chol Calc (NIH): 97 mg/dL (ref 0–99)
Triglycerides: 181 mg/dL — ABNORMAL HIGH (ref 0–149)
VLDL Cholesterol Cal: 31 mg/dL (ref 5–40)

## 2024-01-04 ENCOUNTER — Ambulatory Visit: Payer: Self-pay | Admitting: Nurse Practitioner

## 2024-01-25 ENCOUNTER — Encounter: Payer: Self-pay | Admitting: Family Medicine

## 2024-01-25 ENCOUNTER — Ambulatory Visit (INDEPENDENT_AMBULATORY_CARE_PROVIDER_SITE_OTHER): Admitting: Family Medicine

## 2024-01-25 ENCOUNTER — Ambulatory Visit: Payer: Self-pay

## 2024-01-25 VITALS — BP 128/86 | HR 96 | Temp 97.5°F | Ht 69.0 in | Wt >= 6400 oz

## 2024-01-25 DIAGNOSIS — L03116 Cellulitis of left lower limb: Secondary | ICD-10-CM | POA: Diagnosis not present

## 2024-01-25 DIAGNOSIS — L03115 Cellulitis of right lower limb: Secondary | ICD-10-CM | POA: Diagnosis not present

## 2024-01-25 DIAGNOSIS — I890 Lymphedema, not elsewhere classified: Secondary | ICD-10-CM | POA: Diagnosis not present

## 2024-01-25 MED ORDER — CEPHALEXIN 500 MG PO CAPS: 500.0000 mg | ORAL_CAPSULE | Freq: Four times a day (QID) | ORAL | 0 refills | Status: DC

## 2024-01-25 NOTE — Progress Notes (Signed)
 BP 128/86   Pulse 96   Temp (!) 97.5 F (36.4 C)   Ht 5' 9 (1.753 m)   Wt (!) 445 lb (201.9 kg)   SpO2 95%   BMI 65.72 kg/m    Subjective:   Patient ID: Justin Schmidt, male    DOB: [birth year], 43 y.o.   MRN: [id number]  HPI: Justin Schmidt is a 43 y.o. male presenting on 01/25/2024 for Rash (BLE. R>L. H/O cellulitis/)   Discussed the use of AI scribe software for clinical note transcription with the patient, who gave verbal consent to proceed.  History of Present Illness   Justin Schmidt is a 43 year old male with cellulitis who presents with worsening symptoms and drainage from the right leg.  Right lower extremity edema and drainage - Chronic right lower extremity swelling with recurrent episodes of 'bubble, weak spots' that weep clear fluid - Manages drainage by expressing fluid and covering with a Band-Aid - Currently has a large area on the right leg, onset a couple of days ago, now the most severely affected site - Clear fluid drainage from affected areas - No significant pain at drainage sites, describes sensation as 'tickling a little bit' - Compression wraps used regularly, but intolerable when legs are weeping - History of hospitalization about one year ago for similar worsening symptoms - Attributes some swelling to recent COVID-19 infection  Weight management and obesity - Current weight approximately 445 pounds - Actively attempting weight loss through fasting and dietary changes - Notices some changes in clothing fit, but weight remains stable - Intends to reduce soda intake and increase physical activity  Medication use and allergies - Currently taking furosemide  20 mg daily, especially during symptom flares - Tolerates Caplex, used in the past for similar issues - Allergic to metformin  and Xarelto           Relevant past medical, surgical, family and social history reviewed and updated as indicated. Interim medical history since our last visit  reviewed. Allergies and medications reviewed and updated.  Review of Systems  Constitutional:  Negative for chills and fever.  Respiratory:  Negative for shortness of breath and wheezing.   Cardiovascular:  Positive for leg swelling. Negative for chest pain.  Gastrointestinal:  Negative for abdominal pain.  Musculoskeletal:  Negative for back pain and gait problem.  Skin:  Negative for rash.  All other systems reviewed and are negative.   Per HPI unless specifically indicated above   Allergies as of 01/25/2024       Reactions   Metformin  And Related    diarrhea   Xarelto  [rivaroxaban ]    DROWSINESS, FATIGUE        Medication List        Accurate as of January 25, 2024  2:52 PM. If you have any questions, ask your nurse or doctor.          acetaminophen -codeine  300-30 MG tablet Commonly known as: TYLENOL  #3 Take 1 tablet by mouth in the morning and at bedtime.   aspirin EC 81 MG tablet Take 81 mg by mouth every other day.   Beet Root 500 MG Caps Take 1 capsule by mouth daily.   benzonatate  200 MG capsule Commonly known as: TESSALON  Take 1 capsule (200 mg total) by mouth 3 (three) times daily as needed.   cephALEXin  500 MG capsule Commonly known as: KEFLEX  Take 1 capsule (500 mg total) by mouth 4 (four) times daily. Started by: Fonda LABOR Calie Buttrey  diclofenac  75 MG EC tablet Commonly known as: VOLTAREN  TAKE 1 TABLET BY MOUTH TWICE  DAILY FOR FOOT PAIN   escitalopram  10 MG tablet Commonly known as: LEXAPRO  Take 1 tablet (10 mg total) by mouth daily.   FLAXSEED OIL PO Take 1 capsule by mouth daily.   fluticasone  50 MCG/ACT nasal spray Commonly known as: FLONASE  SPRAY 2 SPRAYS INTO EACH NOSTRIL EVERY DAY   furosemide  20 MG tablet Commonly known as: LASIX  Take 1 tablet (20 mg total) by mouth daily.   GARLIC PO Take by mouth.   lisinopril  10 MG tablet Commonly known as: ZESTRIL  Take 1 tablet (10 mg total) by mouth daily.   multivitamin with  minerals Tabs tablet Take 1 tablet by mouth daily.   rosuvastatin  5 MG tablet Commonly known as: CRESTOR  Take 1 tablet (5 mg total) by mouth daily.   sitaGLIPtin  50 MG tablet Commonly known as: Januvia  Take 1 tablet (50 mg total) by mouth daily.   vitamin C 1000 MG tablet Take 1,000 mg by mouth daily.   Vitamin D 50 MCG (2000 UT) tablet Take 2,000 Units by mouth daily.   ZINC ACETATE PO Take 1 tablet by mouth daily.         Objective:   BP 128/86   Pulse 96   Temp (!) 97.5 F (36.4 C)   Ht 5' 9 (1.753 m)   Wt (!) 445 lb (201.9 kg)   SpO2 95%   BMI 65.72 kg/m   Wt Readings from Last 3 Encounters:  01/25/24 (!) 445 lb (201.9 kg)  01/01/24 (!) 446 lb (202.3 kg)  12/22/23 (!) 446 lb (202.3 kg)    Physical Exam Physical Exam   MEASUREMENTS: Weight- 445. EXTREMITIES: Right leg with cellulitis, clear drainage, and redness on the back. Left leg with mild infection and redness.       Nurse to apply [employer]  Assessment & Plan:   Problem List Items Addressed This Visit   None Visit Diagnoses       Lymphedema    -  Primary   Relevant Medications   cephALEXin  (KEFLEX ) 500 MG capsule     Bilateral lower leg cellulitis       Relevant Medications   cephALEXin  (KEFLEX ) 500 MG capsule           Chronic lymphedema and chronic lower extremity edema with superimposed cellulitis, bilateral lower extremities Chronic lymphedema and edema with superimposed cellulitis, more severe on the right. Recurrent condition, previously hospitalized. Possible exacerbation from recent COVID-19 infection. Compression therapy indicated but not tolerated during weeping. - Double furosemide  to 20 mg twice daily for three days. - Apply compression wraps with medicated ointment for one week. - Prescribe oral Cephalexin  for cellulitis. - Schedule follow-up in one week for reassessment.  Morbid obesity Morbid obesity at approximately 445 lbs. Previous weight loss attempts through  fasting and exercise ineffective. Requires structured weight management program. [facility] in [facility]. - Refer to [facility]. - Encourage reduction of soda intake and portion size modification.      Follow up plan: Return if symptoms worsen or fail to improve, for 1 week Unna boot.  Counseling provided for all of the vaccine components No orders of the defined types were placed in this encounter.   Fonda Levins, MD [facility] 01/25/2024, 2:52 PM

## 2024-01-25 NOTE — Telephone Encounter (Signed)
 Appt made.

## 2024-01-25 NOTE — Telephone Encounter (Signed)
 Patient's sister requested an appointment this afternoon so patient's father can come with them. Appointment made for this afternoon 01/25/2024 at 2:20pm with Dr Dettinger.     FYI Only or Action Required?: FYI only for provider.  Patient was last seen in primary care on 01/01/2024 by Gladis Mustard, FNP.  Called Nurse Triage reporting No chief complaint on file..  Symptoms began about 3 days ago.  Interventions attempted: Rest, hydration, or home remedies.  Symptoms are: gradually worsening.  Triage Disposition: See HCP Within 4 Hours (Or PCP Triage)  Patient/caregiver understands and will follow disposition?: Yes                   Copied from CRM #[id number]. Topic: Clinical - Red Word Triage >> Jan 25, 2024  8:44 AM Wess RAMAN wrote: Red Word that prompted transfer to Nurse Triage: Requesting appt for referral for vein specialist Pain to legs, causing difficulty walking. Leg also weeping and pain level 8 Reason for Disposition  [1] Red area or streak AND [2] large (> 2 inches or 5 cm)  Answer Assessment - Initial Assessment Questions Went to a vein specialist about a year ago and did not have a good experience with that & wants to be seen for this and possibly get a new referral to another vein specialist Sister called today and is with the patient Patient has a history of cellulitis  Patient is advised that if anything worsens to go to the Emergency Room. Patient verbalized understanding.   1. ONSET: When did the pain start?      3 days ago with swelling of both 2. LOCATION: Where is the pain located?      Both lower extremities 3. PAIN: How bad is the pain?    (Scale 1-10; or mild, moderate, severe)     8 4. WORK OR EXERCISE: Has there been any recent work or exercise that involved this part of the body?      Daily use 5. CAUSE: What do you think is causing the leg pain?     Possibly cellulitis 6. OTHER SYMPTOMS: Do you have any other  symptoms? (e.g., chest pain, back pain, breathing difficulty, swelling, rash, fever, numbness, weakness)     Pain, swelling, history of cellulitis  Protocols used: Leg Pain-A-AH

## 2024-02-01 ENCOUNTER — Encounter: Payer: Self-pay | Admitting: Family Medicine

## 2024-02-01 ENCOUNTER — Ambulatory Visit: Admitting: Family Medicine

## 2024-02-01 VITALS — BP 136/81 | Ht 69.0 in | Wt >= 6400 oz

## 2024-02-01 DIAGNOSIS — L03116 Cellulitis of left lower limb: Secondary | ICD-10-CM

## 2024-02-01 DIAGNOSIS — I890 Lymphedema, not elsewhere classified: Secondary | ICD-10-CM

## 2024-02-01 DIAGNOSIS — L03115 Cellulitis of right lower limb: Secondary | ICD-10-CM

## 2024-02-01 NOTE — Progress Notes (Signed)
 BP 136/81   Ht 5' 9 (1.753 m)   Wt (!) 438 lb (198.7 kg)   SpO2 95%   BMI 64.68 kg/m    Subjective:   Patient ID: Justin Schmidt, male    DOB: [birth year], 43 y.o.   MRN: [id number]  HPI: Justin Schmidt is a 43 y.o. male presenting on 02/01/2024 for lymphedema (Bilateral unna boots)   Discussed the use of AI scribe software for clinical note transcription with the patient, who gave verbal consent to proceed.  History of Present Illness       Patient is coming in today for recheck on bilateral lower extremities edema and weeping and cellulitis on the right lower extremity.  He says he is feeling a lot better and does not have any pain in either of his lower extremities.  He denies any fevers or chills.  He has a couple doses of the antibiotic left and he feels like he is doing better.  He denies any further weeping or soreness.  He has done the [employer] this past week.      Relevant past medical, surgical, family and social history reviewed and updated as indicated. Interim medical history since our last visit reviewed. Allergies and medications reviewed and updated.  Review of Systems  Constitutional:  Negative for chills and fever.  Respiratory:  Negative for shortness of breath and wheezing.   Cardiovascular:  Positive for leg swelling. Negative for chest pain.  Musculoskeletal:  Negative for back pain and gait problem.  Skin:  Negative for color change, rash and wound.  All other systems reviewed and are negative.   Per HPI unless specifically indicated above   Allergies as of 02/01/2024       Reactions   Metformin  And Related    diarrhea   Xarelto  [rivaroxaban ]    DROWSINESS, FATIGUE        Medication List        Accurate as of February 01, 2024  9:22 AM. If you have any questions, ask your nurse or doctor.          acetaminophen -codeine  300-30 MG tablet Commonly known as: TYLENOL  #3 Take 1 tablet by mouth in the morning and at bedtime.   aspirin EC 81  MG tablet Take 81 mg by mouth every other day.   Beet Root 500 MG Caps Take 1 capsule by mouth daily.   benzonatate  200 MG capsule Commonly known as: TESSALON  Take 1 capsule (200 mg total) by mouth 3 (three) times daily as needed.   cephALEXin  500 MG capsule Commonly known as: KEFLEX  Take 1 capsule (500 mg total) by mouth 4 (four) times daily.   diclofenac  75 MG EC tablet Commonly known as: VOLTAREN  TAKE 1 TABLET BY MOUTH TWICE  DAILY FOR FOOT PAIN   escitalopram  10 MG tablet Commonly known as: LEXAPRO  Take 1 tablet (10 mg total) by mouth daily.   FLAXSEED OIL PO Take 1 capsule by mouth daily.   fluticasone  50 MCG/ACT nasal spray Commonly known as: FLONASE  SPRAY 2 SPRAYS INTO EACH NOSTRIL EVERY DAY   furosemide  20 MG tablet Commonly known as: LASIX  Take 1 tablet (20 mg total) by mouth daily.   GARLIC PO Take by mouth.   lisinopril  10 MG tablet Commonly known as: ZESTRIL  Take 1 tablet (10 mg total) by mouth daily.   multivitamin with minerals Tabs tablet Take 1 tablet by mouth daily.   rosuvastatin  5 MG tablet Commonly known as: CRESTOR  Take 1 tablet (  5 mg total) by mouth daily.   sitaGLIPtin  50 MG tablet Commonly known as: Januvia  Take 1 tablet (50 mg total) by mouth daily.   vitamin C 1000 MG tablet Take 1,000 mg by mouth daily.   Vitamin D 50 MCG (2000 UT) tablet Take 2,000 Units by mouth daily.   ZINC ACETATE PO Take 1 tablet by mouth daily.         Objective:   BP 136/81   Ht 5' 9 (1.753 m)   Wt (!) 438 lb (198.7 kg)   SpO2 95%   BMI 64.68 kg/m   Wt Readings from Last 3 Encounters:  02/01/24 (!) 438 lb (198.7 kg)  01/25/24 (!) 445 lb (201.9 kg)  01/01/24 (!) 446 lb (202.3 kg)    Physical Exam Vitals and nursing note reviewed.  Constitutional:      Appearance: Normal appearance.  Musculoskeletal:        General: Swelling (2+ edema bilateral lower extremities, wounds appear to be all healed or almost healed with scabs over them.   No erythema or drainage.  Hemosiderin staining on bilateral lower extremities) present.  Neurological:     Mental Status: He is alert.                 Assessment & Plan:   Problem List Items Addressed This Visit   None Visit Diagnoses       Lymphedema    -  Primary     Bilateral lower leg cellulitis                    Legs look really good today, I do not think we need to do another Unna boot, I did give him some ace wraps and recommended to go back to his compression stockings and return if he has further issues.     Follow up plan: Return if symptoms worsen or fail to improve.  Counseling provided for all of the vaccine components No orders of the defined types were placed in this encounter.   Fonda Levins, MD [facility] 02/01/2024, 9:22 AM

## 2024-02-27 ENCOUNTER — Other Ambulatory Visit: Payer: Self-pay | Admitting: *Deleted

## 2024-02-27 DIAGNOSIS — E781 Pure hyperglyceridemia: Secondary | ICD-10-CM

## 2024-02-27 DIAGNOSIS — E119 Type 2 diabetes mellitus without complications: Secondary | ICD-10-CM

## 2024-03-29 ENCOUNTER — Ambulatory Visit: Payer: Self-pay | Admitting: Nurse Practitioner

## 2024-04-01 ENCOUNTER — Ambulatory Visit: Admitting: Nurse Practitioner

## 2024-04-01 NOTE — Progress Notes (Deleted)
 Subjective:    Patient ID: Justin Schmidt, male    DOB: Aug 28, 1980, 43 y.o.   MRN: [id number]   Chief Complaint: medical management of chronic issues     HPI:  Justin Schmidt is a 43 y.o. who identifies as a male who was assigned male at birth.   Social history: Lives with: a friend Work history: disability   Comes in today for follow up of the following chronic medical issues:  1. Essential hypertension No c/o chest pain, sob or headache. Does not check blood pressure at home BP Readings from Last 3 Encounters:  02/01/24 136/81  01/25/24 128/86  01/01/24 128/79     2. Hyperlipidemia associated with type 2 diabetes mellitus (HCC) Does not watch diet. Says he works out in home gym almost daily Lab Results  Component Value Date   CHOL 169 01/01/2024   HDL 41 01/01/2024   LDLCALC 97 01/01/2024   TRIG 181 (H) 01/01/2024   CHOLHDL 4.1 01/01/2024   The 10-year ASCVD risk score (Arnett DK, et al., 2019) is: 4.3%   3. Type 2 diabetes mellitus with other speficied complication, without long-term current use of insulin  (HCC) Fasting blood sugars are running around 140-170. He has really been trying to watch diet Lab Results  Component Value Date   HGBA1C 7.5 (H) 01/01/2024     4. Depression, recurrent (HCC) Is on lexapro  and is doing well ***      5. Morbid obesity with BMI of 60.0-69.9, adult (HCC) Weight is unchanged  ***            New complaints: None today  Allergies  Allergen Reactions   Metformin  And Related     diarrhea   Xarelto  [Rivaroxaban ]     DROWSINESS, FATIGUE   Outpatient Encounter Medications as of 04/01/2024  Medication Sig   acetaminophen -codeine  (TYLENOL  #3) 300-30 MG tablet Take 1 tablet by mouth in the morning and at bedtime.   Ascorbic Acid (VITAMIN C) 1000 MG tablet Take 1,000 mg by mouth daily.   aspirin EC 81 MG tablet Take 81 mg by mouth every other day.   benzonatate  (TESSALON ) 200 MG capsule Take 1 capsule (200 mg  total) by mouth 3 (three) times daily as needed.   cephALEXin  (KEFLEX ) 500 MG capsule Take 1 capsule (500 mg total) by mouth 4 (four) times daily.   Cholecalciferol (VITAMIN D) 2000 UNITS tablet Take 2,000 Units by mouth daily.   diclofenac  (VOLTAREN ) 75 MG EC tablet TAKE 1 TABLET BY MOUTH TWICE  DAILY FOR FOOT PAIN   escitalopram  (LEXAPRO ) 10 MG tablet Take 1 tablet (10 mg total) by mouth daily.   Flaxseed, Linseed, (FLAXSEED OIL PO) Take 1 capsule by mouth daily.   fluticasone  (FLONASE ) 50 MCG/ACT nasal spray SPRAY 2 SPRAYS INTO EACH NOSTRIL EVERY DAY   furosemide  (LASIX ) 20 MG tablet Take 1 tablet (20 mg total) by mouth daily.   GARLIC PO Take by mouth.   lisinopril  (ZESTRIL ) 10 MG tablet TAKE 1 TABLET BY MOUTH DAILY   Misc Natural Products (BEET ROOT) 500 MG CAPS Take 1 capsule by mouth daily.   Multiple Vitamin (MULTIVITAMIN WITH MINERALS) TABS Take 1 tablet by mouth daily.   rosuvastatin  (CRESTOR ) 5 MG tablet TAKE 1 TABLET BY MOUTH DAILY   sitaGLIPtin  (JANUVIA ) 50 MG tablet TAKE 1 TABLET BY MOUTH DAILY   Zinc Acetate, Oral, (ZINC ACETATE PO) Take 1 tablet by mouth daily.   Facility-Administered Encounter Medications as of 04/01/2024  Medication  sodium chloride  irrigation 0.9 %    Past Surgical History:  Procedure Laterality Date   APPENDECTOMY  1991   CHOLECYSTECTOMY  01/23/2012   Procedure: LAPAROSCOPIC CHOLECYSTECTOMY;  Surgeon: Thresa JAYSON Pulling, MD;  Location: [facility];  Service: General;  Laterality: N/A;   TONSILLECTOMY      Family History  Problem Relation Age of Onset   Diabetes Mother    Diabetes Father    Hypertension Father    Hyperlipidemia Father    Migraines Sister    Diabetes Maternal Uncle    Diabetes Maternal Grandmother    Diabetes Other       Controlled substance contract: n/a     Review of Systems  Constitutional:  Negative for diaphoresis.  Eyes:  Negative for pain.  Respiratory:  Negative for shortness of breath.   Cardiovascular:  Negative  for chest pain, palpitations and leg swelling.  Gastrointestinal:  Negative for abdominal pain.  Endocrine: Negative for polydipsia.  Skin:  Negative for rash.  Neurological:  Negative for dizziness, weakness and headaches.  Hematological:  Does not bruise/bleed easily.  All other systems reviewed and are negative.      Objective:   Physical Exam Vitals and nursing note reviewed.  Constitutional:      Appearance: Normal appearance. He is well-developed.  HENT:     Head: Normocephalic.     Nose: Nose normal.     Mouth/Throat:     Mouth: Mucous membranes are moist.     Pharynx: Oropharynx is clear.  Eyes:     Pupils: Pupils are equal, round, and reactive to light.  Neck:     Thyroid : No thyroid  mass or thyromegaly.     Vascular: No carotid bruit or JVD.     Trachea: Phonation normal.  Cardiovascular:     Rate and Rhythm: Normal rate and regular rhythm.  Pulmonary:     Effort: Pulmonary effort is normal. No respiratory distress.     Breath sounds: Normal breath sounds.  Abdominal:     General: Bowel sounds are normal.     Palpations: Abdomen is soft.     Tenderness: There is no abdominal tenderness.     Hernia: A hernia (nontender umbilical hernia) is present.  Musculoskeletal:        General: Normal range of motion.     Cervical back: Normal range of motion and neck supple.     Right lower leg: Edema (2+) present.     Left lower leg: Edema (2+) present.  Lymphadenopathy:     Cervical: No cervical adenopathy.  Skin:    General: Skin is warm and dry.     Comments: Circulatory skin changes bil lower ext  Neurological:     Mental Status: He is alert and oriented to person, place, and time.  Psychiatric:        Behavior: Behavior normal.        Thought Content: Thought content normal.        Judgment: Judgment normal.     There were no vitals taken for this visit.   Hgba1c  7.5%     Assessment & Plan:   Justin Schmidt comes in today with chief complaint of  medical management of chronic issues    Diagnosis and orders addressed:  1. Essential hypertension Low fat diet - CBC with Differential/Platelet - CMP14+EGFR  2. Hyperlipidemia associated with type 2 diabetes mellitus (HCC) Low fat diet - Lipid panel  3. Type 2 diabetes mellitus without complication, without  long-term current use of insulin  (HCC) Strict carb counting Back on diet Start back exercising - Bayer DCA Hb A1c Waived - rosuvastatin  (CRESTOR ) 5 MG tablet; Take 1 tablet (5 mg total) by mouth daily.  Dispense: 90 tablet; Refill: 1 - lisinopril  (ZESTRIL ) 10 MG tablet; Take 1 tablet (10 mg total) by mouth daily.  Dispense: 90 tablet; Refill: 1 - sitaGLIPtin  (JANUVIA ) 50 MG tablet; Take 1 tablet (50 mg total) by mouth daily.  Dispense: 90 tablet; Refill: 1  4. Depression, recurrent (HCC) Stress management - escitalopram  (LEXAPRO ) 10 MG tablet; Take 1 tablet (10 mg total) by mouth daily.  Dispense: 90 tablet; Refill: 1  5. Morbid obesity with BMI of 60.0-69.9, adult (HCC) Discussed diet and exercise for person with BMI >25 Will recheck weight in 3-6 months  6. Swelling - furosemide  (LASIX ) 20 MG tablet; Take 1 tablet (20 mg total) by mouth daily.  Dispense: 90 tablet; Refill: 1   Labs pending Health Maintenance reviewed Diet and exercise encouraged  Follow up plan: 3 months   [name] Gladis, FNP

## 2024-04-02 ENCOUNTER — Other Ambulatory Visit: Payer: Self-pay | Admitting: Nurse Practitioner

## 2024-04-02 DIAGNOSIS — G8929 Other chronic pain: Secondary | ICD-10-CM

## 2024-04-05 ENCOUNTER — Ambulatory Visit: Admitting: Nurse Practitioner

## 2024-04-05 ENCOUNTER — Encounter: Payer: Self-pay | Admitting: Nurse Practitioner

## 2024-04-05 VITALS — BP 147/81 | HR 89 | Temp 97.2°F | Ht 69.0 in | Wt >= 6400 oz

## 2024-04-05 DIAGNOSIS — E781 Pure hyperglyceridemia: Secondary | ICD-10-CM

## 2024-04-05 DIAGNOSIS — R609 Edema, unspecified: Secondary | ICD-10-CM

## 2024-04-05 DIAGNOSIS — E119 Type 2 diabetes mellitus without complications: Secondary | ICD-10-CM

## 2024-04-05 DIAGNOSIS — F339 Major depressive disorder, recurrent, unspecified: Secondary | ICD-10-CM

## 2024-04-05 DIAGNOSIS — I10 Essential (primary) hypertension: Secondary | ICD-10-CM

## 2024-04-05 DIAGNOSIS — E1169 Type 2 diabetes mellitus with other specified complication: Secondary | ICD-10-CM

## 2024-04-05 DIAGNOSIS — M545 Low back pain, unspecified: Secondary | ICD-10-CM

## 2024-04-05 LAB — BAYER DCA HB A1C WAIVED: HB A1C (BAYER DCA - WAIVED): 7.4 % — ABNORMAL HIGH (ref 4.8–5.6)

## 2024-04-05 LAB — LIPID PANEL

## 2024-04-05 MED ORDER — ACETAMINOPHEN-CODEINE 300-30 MG PO TABS: 1.0000 | ORAL_TABLET | Freq: Two times a day (BID) | ORAL | 5 refills | Status: AC

## 2024-04-05 MED ORDER — LISINOPRIL 10 MG PO TABS: 10.0000 mg | ORAL_TABLET | Freq: Every day | ORAL | 1 refills | Status: AC

## 2024-04-05 MED ORDER — FUROSEMIDE 20 MG PO TABS: 20.0000 mg | ORAL_TABLET | Freq: Every day | ORAL | 1 refills | Status: DC

## 2024-04-05 MED ORDER — ESCITALOPRAM OXALATE 10 MG PO TABS: 10.0000 mg | ORAL_TABLET | Freq: Every day | ORAL | 1 refills | Status: DC

## 2024-04-05 MED ORDER — ROSUVASTATIN CALCIUM 5 MG PO TABS: 5.0000 mg | ORAL_TABLET | Freq: Every day | ORAL | 1 refills | Status: AC

## 2024-04-05 MED ORDER — SITAGLIPTIN PHOSPHATE 50 MG PO TABS: 50.0000 mg | ORAL_TABLET | Freq: Every day | ORAL | 1 refills | Status: AC

## 2024-04-05 NOTE — Progress Notes (Signed)
 Subjective:    Patient ID: Justin Schmidt, male    DOB: 08-22-1980, 43 y.o.   MRN: [id number]   Chief Complaint: medical management of chronic issues     HPI:  Justin Schmidt is a 43 y.o. who identifies as a male who was assigned male at birth.   Social history: Lives with: a friend Work history: disability   Comes in today for follow up of the following chronic medical issues:  1. Essential hypertension No c/o chest pain, sob or headache. Does not check blood pressure at home BP Readings from Last 3 Encounters:  02/01/24 136/81  01/25/24 128/86  01/01/24 128/79     2. Hyperlipidemia associated with type 2 diabetes mellitus (HCC) Does not watch diet. Says he works out in home gym almost daily Lab Results  Component Value Date   CHOL 169 01/01/2024   HDL 41 01/01/2024   LDLCALC 97 01/01/2024   TRIG 181 (H) 01/01/2024   CHOLHDL 4.1 01/01/2024   The 10-year ASCVD risk score (Arnett DK, et al., 2019) is: 4.3%   3. Type 2 diabetes mellitus with other speficied complication, without long-term current use of insulin  (HCC) Fasting blood sugars are running around 140-170. He has really been trying to watch diet Lab Results  Component Value Date   HGBA1C 7.5 (H) 01/01/2024     4. Depression, recurrent (HCC) Is on lexapro  and is doing well    04/05/2024    2:59 PM 02/01/2024    9:21 AM 01/25/2024    2:27 PM  Depression screen PHQ 2/9  Decreased Interest 0 0 0  Down, Depressed, Hopeless 1 0 0  PHQ - 2 Score 1 0 0  Altered sleeping 0    Tired, decreased energy 0    Change in appetite 0    Feeling bad or failure about yourself  0    Trouble concentrating 0    Moving slowly or fidgety/restless 0    Suicidal thoughts 0    PHQ-9 Score 1    Difficult doing work/chores Very difficult        5. Morbid obesity with BMI of 60.0-69.9, adult (HCC) Weight is down 14lbs  Wt Readings from Last 3 Encounters:  04/05/24 (!) 424 lb (192.3 kg)  02/01/24 (!) 438 lb (198.7 kg)   01/25/24 (!) 445 lb (201.9 kg)   BMI Readings from Last 3 Encounters:  04/05/24 62.61 kg/m  02/01/24 64.68 kg/m  01/25/24 65.72 kg/m               New complaints: None today  Allergies  Allergen Reactions   Metformin  And Related     diarrhea   Xarelto  [Rivaroxaban ]     DROWSINESS, FATIGUE   Outpatient Encounter Medications as of 04/05/2024  Medication Sig   acetaminophen -codeine  (TYLENOL  #3) 300-30 MG tablet Take 1 tablet by mouth in the morning and at bedtime.   Ascorbic Acid (VITAMIN C) 1000 MG tablet Take 1,000 mg by mouth daily.   aspirin EC 81 MG tablet Take 81 mg by mouth every other day.   benzonatate  (TESSALON ) 200 MG capsule Take 1 capsule (200 mg total) by mouth 3 (three) times daily as needed.   cephALEXin  (KEFLEX ) 500 MG capsule Take 1 capsule (500 mg total) by mouth 4 (four) times daily.   Cholecalciferol (VITAMIN D) 2000 UNITS tablet Take 2,000 Units by mouth daily.   diclofenac  (VOLTAREN ) 75 MG EC tablet TAKE 1 TABLET BY MOUTH TWICE  DAILY FOR FOOT PAIN  escitalopram  (LEXAPRO ) 10 MG tablet Take 1 tablet (10 mg total) by mouth daily.   Flaxseed, Linseed, (FLAXSEED OIL PO) Take 1 capsule by mouth daily.   fluticasone  (FLONASE ) 50 MCG/ACT nasal spray SPRAY 2 SPRAYS INTO EACH NOSTRIL EVERY DAY   furosemide  (LASIX ) 20 MG tablet Take 1 tablet (20 mg total) by mouth daily.   GARLIC PO Take by mouth.   lisinopril  (ZESTRIL ) 10 MG tablet TAKE 1 TABLET BY MOUTH DAILY   Misc Natural Products (BEET ROOT) 500 MG CAPS Take 1 capsule by mouth daily.   Multiple Vitamin (MULTIVITAMIN WITH MINERALS) TABS Take 1 tablet by mouth daily.   rosuvastatin  (CRESTOR ) 5 MG tablet TAKE 1 TABLET BY MOUTH DAILY   sitaGLIPtin  (JANUVIA ) 50 MG tablet TAKE 1 TABLET BY MOUTH DAILY   Zinc Acetate, Oral, (ZINC ACETATE PO) Take 1 tablet by mouth daily.   Facility-Administered Encounter Medications as of 04/05/2024  Medication   sodium chloride  irrigation 0.9 %    Past Surgical  History:  Procedure Laterality Date   APPENDECTOMY  1991   CHOLECYSTECTOMY  01/23/2012   Procedure: LAPAROSCOPIC CHOLECYSTECTOMY;  Surgeon: Thresa JAYSON Pulling, MD;  Location: [facility];  Service: General;  Laterality: N/A;   TONSILLECTOMY      Family History  Problem Relation Age of Onset   Diabetes Mother    Diabetes Father    Hypertension Father    Hyperlipidemia Father    Migraines Sister    Diabetes Maternal Uncle    Diabetes Maternal Grandmother    Diabetes Other       Controlled substance contract: n/a     Review of Systems  Constitutional:  Negative for diaphoresis.  Eyes:  Negative for pain.  Respiratory:  Negative for shortness of breath.   Cardiovascular:  Negative for chest pain, palpitations and leg swelling.  Gastrointestinal:  Negative for abdominal pain.  Endocrine: Negative for polydipsia.  Skin:  Negative for rash.  Neurological:  Negative for dizziness, weakness and headaches.  Hematological:  Does not bruise/bleed easily.  All other systems reviewed and are negative.      Objective:   Physical Exam Vitals and nursing note reviewed.  Constitutional:      Appearance: Normal appearance. He is well-developed.  HENT:     Head: Normocephalic.     Nose: Nose normal.     Mouth/Throat:     Mouth: Mucous membranes are moist.     Pharynx: Oropharynx is clear.  Eyes:     Pupils: Pupils are equal, round, and reactive to light.  Neck:     Thyroid : No thyroid  mass or thyromegaly.     Vascular: No carotid bruit or JVD.     Trachea: Phonation normal.  Cardiovascular:     Rate and Rhythm: Normal rate and regular rhythm.  Pulmonary:     Effort: Pulmonary effort is normal. No respiratory distress.     Breath sounds: Normal breath sounds.  Abdominal:     General: Bowel sounds are normal.     Palpations: Abdomen is soft.     Tenderness: There is no abdominal tenderness.     Hernia: A hernia (nontender umbilical hernia) is present.  Musculoskeletal:         General: Normal range of motion.     Cervical back: Normal range of motion and neck supple.     Right lower leg: Edema (2+) present.     Left lower leg: Edema (2+) present.  Lymphadenopathy:     Cervical: No cervical  adenopathy.  Skin:    General: Skin is warm and dry.     Comments: Circulatory skin changes bil lower ext  Neurological:     Mental Status: He is alert and oriented to person, place, and time.  Psychiatric:        Behavior: Behavior normal.        Thought Content: Thought content normal.        Judgment: Judgment normal.     BP (!) 147/81   Pulse 89   Temp (!) 97.2 F (36.2 C) (Temporal)   Ht 5' 9 (1.753 m)   Wt (!) 424 lb (192.3 kg)   SpO2 91%   BMI 62.61 kg/m     Hgba1c  7.4%     Assessment & Plan:   Justin Schmidt comes in today with chief complaint of medical management of chronic issues    Diagnosis and orders addressed:  1. Essential hypertension Low fat diet - CBC with Differential/Platelet - CMP14+EGFR  2. Hyperlipidemia associated with type 2 diabetes mellitus (HCC) Low fat diet - Lipid panel  3. Type 2 diabetes mellitus without complication, without long-term current use of insulin  (HCC) Strict carb counting Back on diet Start back exercising - Bayer DCA Hb A1c Waived - rosuvastatin  (CRESTOR ) 5 MG tablet; Take 1 tablet (5 mg total) by mouth daily.  Dispense: 90 tablet; Refill: 1 - lisinopril  (ZESTRIL ) 10 MG tablet; Take 1 tablet (10 mg total) by mouth daily.  Dispense: 90 tablet; Refill: 1 - sitaGLIPtin  (JANUVIA ) 50 MG tablet; Take 1 tablet (50 mg total) by mouth daily.  Dispense: 90 tablet; Refill: 1  4. Depression, recurrent (HCC) Stress management - escitalopram  (LEXAPRO ) 10 MG tablet; Take 1 tablet (10 mg total) by mouth daily.  Dispense: 90 tablet; Refill: 1  5. Morbid obesity with BMI of 60.0-69.9, adult (HCC) Discussed diet and exercise for person with BMI >25 Will recheck weight in 3-6 months  6. Swelling - furosemide   (LASIX ) 20 MG tablet; Take 1 tablet (20 mg total) by mouth daily.  Dispense: 90 tablet; Refill: 1   Labs pending Health Maintenance reviewed Diet and exercise encouraged  Follow up plan: 3 months   [name] Gladis, FNP

## 2024-04-05 NOTE — Patient Instructions (Signed)

## 2024-04-06 LAB — CBC WITH DIFFERENTIAL/PLATELET
Basophils Absolute: 0.1 x10E3/uL (ref 0.0–0.2)
Basos: 1 %
EOS (ABSOLUTE): 0.1 x10E3/uL (ref 0.0–0.4)
Eos: 2 %
Hematocrit: 45.4 % (ref 37.5–51.0)
Hemoglobin: 14.9 g/dL (ref 13.0–17.7)
Immature Grans (Abs): 0 x10E3/uL (ref 0.0–0.1)
Immature Granulocytes: 0 %
Lymphocytes Absolute: 1.4 x10E3/uL (ref 0.7–3.1)
Lymphs: 22 %
MCH: 30.5 pg (ref 26.6–33.0)
MCHC: 32.8 g/dL (ref 31.5–35.7)
MCV: 93 fL (ref 79–97)
Monocytes Absolute: 0.5 x10E3/uL (ref 0.1–0.9)
Monocytes: 8 %
Neutrophils Absolute: 4.2 x10E3/uL (ref 1.4–7.0)
Neutrophils: 67 %
Platelets: 264 x10E3/uL (ref 150–450)
RBC: 4.88 x10E6/uL (ref 4.14–5.80)
RDW: 12.9 % (ref 11.6–15.4)
WBC: 6.3 x10E3/uL (ref 3.4–10.8)

## 2024-04-06 LAB — LIPID PANEL
Cholesterol, Total: 182 mg/dL (ref 100–199)
HDL: 44 mg/dL (ref >39)
LDL CALC COMMENT:: 4.1 ratio (ref 0.0–5.0)
LDL Chol Calc (NIH): 112 mg/dL — AB (ref 0–99)
Triglycerides: 145 mg/dL (ref 0–149)
VLDL Cholesterol Cal: 26 mg/dL (ref 5–40)

## 2024-04-06 LAB — CMP14+EGFR
ALT: 56 IU/L — AB (ref 0–44)
AST: 38 IU/L (ref 0–40)
Albumin: 4.6 g/dL (ref 4.1–5.1)
Alkaline Phosphatase: 74 IU/L (ref 47–123)
BUN/Creatinine Ratio: 11 (ref 9–20)
BUN: 12 mg/dL (ref 6–24)
Bilirubin Total: 0.4 mg/dL (ref 0.0–1.2)
CO2: 23 mmol/L (ref 20–29)
Calcium: 9.9 mg/dL (ref 8.7–10.2)
Chloride: 101 mmol/L (ref 96–106)
Creatinine, Ser: 1.07 mg/dL (ref 0.76–1.27)
Globulin, Total: 3 g/dL (ref 1.5–4.5)
Glucose: 141 mg/dL — AB (ref 70–99)
Potassium: 4.2 mmol/L (ref 3.5–5.2)
Sodium: 139 mmol/L (ref 134–144)
Total Protein: 7.6 g/dL (ref 6.0–8.5)
eGFR: 88 mL/min/1.73 (ref >59)

## 2024-04-07 ENCOUNTER — Ambulatory Visit: Payer: Self-pay | Admitting: Nurse Practitioner

## 2024-05-15 ENCOUNTER — Other Ambulatory Visit: Payer: Self-pay | Admitting: Nurse Practitioner

## 2024-05-15 DIAGNOSIS — R609 Edema, unspecified: Secondary | ICD-10-CM

## 2024-05-17 ENCOUNTER — Other Ambulatory Visit: Payer: Self-pay | Admitting: Family Medicine

## 2024-05-17 ENCOUNTER — Other Ambulatory Visit: Payer: Self-pay | Admitting: Family

## 2024-05-17 DIAGNOSIS — L03115 Cellulitis of right lower limb: Secondary | ICD-10-CM

## 2024-05-17 DIAGNOSIS — G8929 Other chronic pain: Secondary | ICD-10-CM

## 2024-05-17 DIAGNOSIS — J069 Acute upper respiratory infection, unspecified: Secondary | ICD-10-CM

## 2024-05-17 DIAGNOSIS — I890 Lymphedema, not elsewhere classified: Secondary | ICD-10-CM

## 2024-06-01 ENCOUNTER — Other Ambulatory Visit: Payer: Self-pay | Admitting: Nurse Practitioner

## 2024-06-01 DIAGNOSIS — F339 Major depressive disorder, recurrent, unspecified: Secondary | ICD-10-CM

## 2024-07-01 ENCOUNTER — Ambulatory Visit: Admitting: Nurse Practitioner

## 2024-12-22 ENCOUNTER — Ambulatory Visit: Payer: Self-pay
